# Patient Record
Sex: Female | Born: 1985 | Race: White | Hispanic: No | Marital: Single | Smoking: Current every day smoker
Health system: Southern US, Community
[De-identification: ages and names within clinical notes are randomized; demographics above are authoritative.]

## PROBLEM LIST (undated history)

## (undated) DIAGNOSIS — R319 Hematuria, unspecified: Secondary | ICD-10-CM

## (undated) DIAGNOSIS — N840 Polyp of corpus uteri: Secondary | ICD-10-CM

## (undated) DIAGNOSIS — N201 Calculus of ureter: Secondary | ICD-10-CM

## (undated) DIAGNOSIS — R87629 Unspecified abnormal cytological findings in specimens from vagina: Secondary | ICD-10-CM

## (undated) DIAGNOSIS — F419 Anxiety disorder, unspecified: Secondary | ICD-10-CM

## (undated) DIAGNOSIS — K219 Gastro-esophageal reflux disease without esophagitis: Secondary | ICD-10-CM

## (undated) DIAGNOSIS — M549 Dorsalgia, unspecified: Secondary | ICD-10-CM

## (undated) DIAGNOSIS — Z8742 Personal history of other diseases of the female genital tract: Secondary | ICD-10-CM

## (undated) DIAGNOSIS — K5900 Constipation, unspecified: Secondary | ICD-10-CM

## (undated) DIAGNOSIS — F329 Major depressive disorder, single episode, unspecified: Secondary | ICD-10-CM

## (undated) DIAGNOSIS — H409 Unspecified glaucoma: Secondary | ICD-10-CM

## (undated) DIAGNOSIS — Z8719 Personal history of other diseases of the digestive system: Secondary | ICD-10-CM

## (undated) DIAGNOSIS — F32A Depression, unspecified: Secondary | ICD-10-CM

## (undated) DIAGNOSIS — N200 Calculus of kidney: Secondary | ICD-10-CM

## (undated) DIAGNOSIS — R3915 Urgency of urination: Secondary | ICD-10-CM

## (undated) DIAGNOSIS — D259 Leiomyoma of uterus, unspecified: Secondary | ICD-10-CM

## (undated) DIAGNOSIS — Z87442 Personal history of urinary calculi: Secondary | ICD-10-CM

## (undated) DIAGNOSIS — A599 Trichomoniasis, unspecified: Secondary | ICD-10-CM

## (undated) DIAGNOSIS — K5190 Ulcerative colitis, unspecified, without complications: Secondary | ICD-10-CM

## (undated) DIAGNOSIS — K5792 Diverticulitis of intestine, part unspecified, without perforation or abscess without bleeding: Secondary | ICD-10-CM

## (undated) DIAGNOSIS — N1330 Unspecified hydronephrosis: Secondary | ICD-10-CM

## (undated) HISTORY — DX: Unspecified abnormal cytological findings in specimens from vagina: R87.629

## (undated) HISTORY — DX: Trichomoniasis, unspecified: A59.9

---
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
## 2004-03-15 ENCOUNTER — Emergency Department (HOSPITAL_COMMUNITY): Admission: EM | Admit: 2004-03-15 | Discharge: 2004-03-15 | Payer: Self-pay | Admitting: Family Medicine

## 2005-08-25 ENCOUNTER — Observation Stay (HOSPITAL_COMMUNITY): Admission: AD | Admit: 2005-08-25 | Discharge: 2005-08-26 | Payer: Self-pay | Admitting: Obstetrics and Gynecology

## 2005-10-05 ENCOUNTER — Ambulatory Visit (HOSPITAL_COMMUNITY): Admission: RE | Admit: 2005-10-05 | Discharge: 2005-10-05 | Payer: Self-pay | Admitting: Obstetrics and Gynecology

## 2005-12-08 ENCOUNTER — Ambulatory Visit: Payer: Self-pay | Admitting: Obstetrics & Gynecology

## 2005-12-08 ENCOUNTER — Inpatient Hospital Stay (HOSPITAL_COMMUNITY): Admission: AD | Admit: 2005-12-08 | Discharge: 2005-12-08 | Payer: Self-pay | Admitting: Obstetrics and Gynecology

## 2005-12-18 ENCOUNTER — Inpatient Hospital Stay (HOSPITAL_COMMUNITY): Admission: AD | Admit: 2005-12-18 | Discharge: 2005-12-20 | Payer: Self-pay | Admitting: Obstetrics and Gynecology

## 2006-02-13 ENCOUNTER — Other Ambulatory Visit: Admission: RE | Admit: 2006-02-13 | Discharge: 2006-02-13 | Payer: Self-pay | Admitting: Obstetrics and Gynecology

## 2007-07-17 ENCOUNTER — Inpatient Hospital Stay (HOSPITAL_COMMUNITY): Admission: AD | Admit: 2007-07-17 | Discharge: 2007-07-17 | Payer: Self-pay | Admitting: Obstetrics and Gynecology

## 2007-11-16 ENCOUNTER — Emergency Department (HOSPITAL_COMMUNITY): Admission: EM | Admit: 2007-11-16 | Discharge: 2007-11-16 | Payer: Self-pay | Admitting: Emergency Medicine

## 2007-11-20 ENCOUNTER — Emergency Department (HOSPITAL_COMMUNITY): Admission: EM | Admit: 2007-11-20 | Discharge: 2007-11-20 | Payer: Self-pay | Admitting: Emergency Medicine

## 2007-11-21 HISTORY — PX: INDUCED ABORTION: SHX677

## 2007-11-25 ENCOUNTER — Encounter: Payer: Self-pay | Admitting: Emergency Medicine

## 2007-11-25 ENCOUNTER — Emergency Department (HOSPITAL_COMMUNITY): Admission: EM | Admit: 2007-11-25 | Discharge: 2007-11-25 | Payer: Self-pay | Admitting: Emergency Medicine

## 2007-11-28 ENCOUNTER — Ambulatory Visit (HOSPITAL_COMMUNITY): Admission: RE | Admit: 2007-11-28 | Discharge: 2007-11-28 | Payer: Self-pay | Admitting: Urology

## 2007-11-28 HISTORY — PX: CYSTOSCOPY WITH RETROGRADE PYELOGRAM, URETEROSCOPY AND STENT PLACEMENT: SHX5789

## 2007-12-04 ENCOUNTER — Emergency Department (HOSPITAL_COMMUNITY): Admission: EM | Admit: 2007-12-04 | Discharge: 2007-12-04 | Payer: Self-pay | Admitting: Emergency Medicine

## 2008-10-26 ENCOUNTER — Emergency Department (HOSPITAL_COMMUNITY): Admission: EM | Admit: 2008-10-26 | Discharge: 2008-10-26 | Payer: Self-pay | Admitting: Family Medicine

## 2008-11-09 ENCOUNTER — Emergency Department (HOSPITAL_COMMUNITY): Admission: EM | Admit: 2008-11-09 | Discharge: 2008-11-09 | Payer: Self-pay | Admitting: Family Medicine

## 2009-03-08 ENCOUNTER — Emergency Department (HOSPITAL_COMMUNITY): Admission: EM | Admit: 2009-03-08 | Discharge: 2009-03-08 | Payer: Self-pay | Admitting: Emergency Medicine

## 2009-10-08 ENCOUNTER — Emergency Department (HOSPITAL_COMMUNITY): Admission: EM | Admit: 2009-10-08 | Discharge: 2009-10-08 | Payer: Self-pay | Admitting: Emergency Medicine

## 2009-11-11 ENCOUNTER — Emergency Department (HOSPITAL_COMMUNITY): Admission: EM | Admit: 2009-11-11 | Discharge: 2009-11-11 | Payer: Self-pay | Admitting: Emergency Medicine

## 2010-01-17 ENCOUNTER — Emergency Department (HOSPITAL_COMMUNITY): Admission: EM | Admit: 2010-01-17 | Discharge: 2010-01-17 | Payer: Self-pay | Admitting: Emergency Medicine

## 2010-08-09 ENCOUNTER — Emergency Department (HOSPITAL_COMMUNITY): Admission: EM | Admit: 2010-08-09 | Discharge: 2010-08-09 | Payer: Self-pay | Admitting: Family Medicine

## 2010-12-05 ENCOUNTER — Inpatient Hospital Stay (HOSPITAL_COMMUNITY)
Admission: AD | Admit: 2010-12-05 | Discharge: 2010-12-06 | Payer: Self-pay | Source: Home / Self Care | Attending: Obstetrics and Gynecology | Admitting: Obstetrics and Gynecology

## 2010-12-07 LAB — URINALYSIS, ROUTINE W REFLEX MICROSCOPIC
Bilirubin Urine: NEGATIVE
Ketones, ur: NEGATIVE mg/dL
Leukocytes, UA: NEGATIVE
Nitrite: NEGATIVE
Protein, ur: NEGATIVE mg/dL
Specific Gravity, Urine: >1.03 — ABNORMAL HIGH (ref 1.005–1.030)
Urine Glucose, Fasting: NEGATIVE mg/dL
Urobilinogen, UA: 0.2 mg/dL (ref 0.0–1.0)
pH: 6 (ref 5.0–8.0)

## 2010-12-07 LAB — URINE MICROSCOPIC-ADD ON

## 2010-12-07 LAB — GC/CHLAMYDIA PROBE AMP, GENITAL
Chlamydia, DNA Probe: NEGATIVE
GC Probe Amp, Genital: NEGATIVE

## 2010-12-07 LAB — WET PREP, GENITAL
Trich, Wet Prep: NONE SEEN
Yeast Wet Prep HPF POC: NONE SEEN

## 2010-12-07 LAB — POCT PREGNANCY, URINE: Preg Test, Ur: NEGATIVE

## 2011-02-20 LAB — POCT I-STAT, CHEM 8
BUN: 9 mg/dL (ref 6–23)
Calcium, Ion: 1.06 mmol/L — ABNORMAL LOW (ref 1.12–1.32)
Chloride: 107 mEq/L (ref 96–112)
Creatinine, Ser: 0.6 mg/dL (ref 0.4–1.2)
Glucose, Bld: 97 mg/dL (ref 70–99)
HCT: 45 % (ref 36.0–46.0)
Hemoglobin: 15.3 g/dL — ABNORMAL HIGH (ref 12.0–15.0)
Potassium: 4 mEq/L (ref 3.5–5.1)
Sodium: 137 mEq/L (ref 135–145)
TCO2: 22 mmol/L (ref 0–100)

## 2011-02-20 LAB — URINALYSIS, ROUTINE W REFLEX MICROSCOPIC
Bilirubin Urine: NEGATIVE
Glucose, UA: NEGATIVE mg/dL
Hgb urine dipstick: NEGATIVE
Ketones, ur: NEGATIVE mg/dL
Nitrite: NEGATIVE
Protein, ur: NEGATIVE mg/dL
Specific Gravity, Urine: 1.02 (ref 1.005–1.030)
Urobilinogen, UA: 0.2 mg/dL (ref 0.0–1.0)
pH: 7 (ref 5.0–8.0)

## 2011-02-20 LAB — POCT PREGNANCY, URINE: Preg Test, Ur: NEGATIVE

## 2011-02-22 LAB — URINE MICROSCOPIC-ADD ON

## 2011-02-22 LAB — URINALYSIS, ROUTINE W REFLEX MICROSCOPIC
Specific Gravity, Urine: 1.037 — ABNORMAL HIGH (ref 1.005–1.030)
pH: 6 (ref 5.0–8.0)

## 2011-02-22 LAB — POCT PREGNANCY, URINE: Preg Test, Ur: NEGATIVE

## 2011-03-01 LAB — WOUND CULTURE: Gram Stain: NONE SEEN

## 2011-03-23 ENCOUNTER — Emergency Department (HOSPITAL_COMMUNITY)
Admission: EM | Admit: 2011-03-23 | Discharge: 2011-03-23 | Disposition: A | Discharge status: Home / Self Care | Payer: Medicaid Other | Attending: Emergency Medicine | Admitting: Emergency Medicine

## 2011-03-23 ENCOUNTER — Emergency Department (HOSPITAL_COMMUNITY): Payer: Medicaid Other

## 2011-03-23 DIAGNOSIS — Z79899 Other long term (current) drug therapy: Secondary | ICD-10-CM | POA: Insufficient documentation

## 2011-03-23 DIAGNOSIS — R059 Cough, unspecified: Secondary | ICD-10-CM | POA: Insufficient documentation

## 2011-03-23 DIAGNOSIS — X001XXA Exposure to smoke in uncontrolled fire in building or structure, initial encounter: Secondary | ICD-10-CM | POA: Insufficient documentation

## 2011-03-23 DIAGNOSIS — J705 Respiratory conditions due to smoke inhalation: Secondary | ICD-10-CM | POA: Insufficient documentation

## 2011-03-23 DIAGNOSIS — F319 Bipolar disorder, unspecified: Secondary | ICD-10-CM | POA: Insufficient documentation

## 2011-03-23 DIAGNOSIS — Y92009 Unspecified place in unspecified non-institutional (private) residence as the place of occurrence of the external cause: Secondary | ICD-10-CM | POA: Insufficient documentation

## 2011-03-23 DIAGNOSIS — X000XXA Exposure to flames in uncontrolled fire in building or structure, initial encounter: Secondary | ICD-10-CM | POA: Insufficient documentation

## 2011-03-23 DIAGNOSIS — R05 Cough: Secondary | ICD-10-CM | POA: Insufficient documentation

## 2011-03-23 DIAGNOSIS — T2009XA Burn of unspecified degree of multiple sites of head, face, and neck, initial encounter: Secondary | ICD-10-CM | POA: Insufficient documentation

## 2011-03-23 DIAGNOSIS — M79609 Pain in unspecified limb: Secondary | ICD-10-CM | POA: Insufficient documentation

## 2011-03-23 DIAGNOSIS — H409 Unspecified glaucoma: Secondary | ICD-10-CM | POA: Insufficient documentation

## 2011-04-04 NOTE — Op Note (Signed)
Jeanne Mcneil, Jeanne Mcneil               ACCOUNT NO.:  [id number]   MEDICAL RECORD NO.:  [id number]          PATIENT TYPE:  AMB   LOCATION:  DAY                          FACILITY:  [facility]   PHYSICIAN:  Hanley Ben, M.D.  DATE OF BIRTH:  11-Oct-1986   DATE OF PROCEDURE:  11/28/2007  DATE OF DISCHARGE:                               OPERATIVE REPORT   PREOPERATIVE DIAGNOSIS:  Left ureteral calculus.   POSTOPERATIVE DIAGNOSIS:  Left ureteral calculus.   PROCEDURE:  Cystoscopy, left retrograde pyelogram, ureteroscopy and  insertion of double-J catheter.   SURGEON:  Dr. Janice Norrie.   ANESTHESIA:  General.   INDICATIONS:  The patient is 25 years old female who has been  complaining of left flank pain for about a week.  She was first seen in  the emergency room on 11/19/2007 with severe back pain associated with  nausea and vomiting.  KUB then showed a 3-mm stone in the left kidney  and a 2-mm stone at the left proximal ureter.  She was sent home on  analgesics.  She returned to the emergency room on November 22, 2006 and  again on November 24, 2006.  CT scan showed moderate to severe left  hydronephrosis with a 3-mm stone in the left kidney and a 3-mm stone in  the left proximal ureter.  Because she has remained symptomatic, she is  scheduled today for cystoscopy, left retrograde pyelogram, ureteroscopy  with stone manipulation and insertion of double-J catheter.  The  procedure, the risks and benefits were discussed in detail with the  patient.  Also told her that if I could not remove the stone I would  leave a stent for passive dilation of the ureter.  She understands and  is agreeable.   Under general anesthesia the patient was prepped and draped and placed  in the dorsal lithotomy position.  A panendoscope was inserted in the  bladder.  The bladder mucosa is normal.  There is no stone or tumor in  the bladder.  The ureteral orifices are in normal position and shape.   Retrograde  pyelogram.   A Glidewire was passed through #6-French open-ended catheter and the  open-ended catheter with the Glide wire were was passed through the  cystoscope into the left ureteral orifice.  The Glidewire was then  passed in the ureter and the open-ended catheter was advanced in the  distal ureter over the Glidewire.  The Glidewire was then removed.  Contrast was then injected through the open-ended catheter.  The distal  mid and proximal ureter appear normal.  There is a filling defect in the  proximal ureter that appears to have migrated in the lower pole of the  kidney with instillation of contrast.  The calices are moderately  dilated.  A sensor tip guidewire was then passed through the open-ended  catheter and the open-ended catheter was removed.   The cystoscope was removed.  A number 6.5 French rigid ureteroscope was  then passed in the bladder and into the left ureteral orifice but the  ureteroscope could not be passed through the intramural ureter.  The  ureteroscope was then removed.  The intramural ureter was then dilated  with the ureteroscope access sheath and the ureteroscope access sheath  was removed.  The ureteroscope was then passed in the bladder into the  ureter and advanced without difficulty all the way up into the upper  ureter to the level of the location of the ureteral calculus.  The  ureteral calculus could not be seen.  And at this point it was difficult  to advance the ureteroscope up into the renal pelvis.  It was then  decided at this point  to remove the ureteroscope and place a double-J  catheter.  The ureteroscope was then removed.  The guidewire was then  back loaded into the cystoscope and a #6-French - 26 double-J catheter  was passed over the guidewire.  The proximal curl of the double-J  catheter is in the renal pelvis.  The distal curl is in the bladder.   The patient tolerated the procedure well and left the OR in satisfactory  condition to  post anesthesia care unit.      Hanley Ben, M.D.  Electronically Signed     MN/MEDQ  D:  11/28/2007  T:  11/28/2007  Job:  [id number]
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
## 2011-04-07 NOTE — H&P (Signed)
NAMEJOE, TANNEY               ACCOUNT NO.:  [id number]   MEDICAL RECORD NO.:  [id number]           PATIENT TYPE:   LOCATION:                                 FACILITY:   PHYSICIAN:  Dede Query. Rivard, M.D. DATE OF BIRTH:  08/08/1986   DATE OF ADMISSION:  DATE OF DISCHARGE:                                HISTORY & PHYSICAL   Ms. Jeanne Mcneil is a 25 year old gravida 4, para 1-0-2-1, who presents at 40-2/[redacted]  weeks gestation with complaints of regular uterine contractions for the past  3-4 hours, as well as the last three to four hours with increasing  intensity.  The patient denies rupture of membranes or bleeding.  The  patient reports that her fetus is moving normally.  The patient's pregnancy  is remarkable for:  1.  History of positive GBS.  2.  Desires bilateral tubal ligation.  3.  Uncertain LMP.  4.  Late entry to care.  5.  History of renal lithiasis.  6.  Tobacco use.  7.  History of postpartum depression.  8.  History of condyloma.  46.  The father of the baby noninvolved in pregnancy.   PRENATAL LABORATORY:  Blood type A positive, antibody screen negative,  hemoglobin initial 10.7, platelets 273,000, rubella immune, hepatitis B  surface antigen negative, syphilis nonreactive, HIV nonreactive, gonorrhea  and chlamydia cultures negative second and third trimester, group B strep  positive on November 27, 2005, one-hour Glucola 103, cystic fibrosis screen  negative, quad screen negative.   HISTORY OF PRESENT PREGNANCY:  The patient entered care at approximately [redacted]  weeks gestation.  Ultrasound was obtained, which established an [facility] of  December 16, 2005 as the patient had uncertain dates.  The patient's  pregnancy has been followed by the Certified Nurse Midwife Service of  [facility].  The patient underwent ultrasound at initial visit  revealing normal anatomy and a female fetus.  The patient admitted at 23 weeks  and 6 days to [facility] with possible renal  lithiasis with sudden  onset of flank and back pain.  The pain resolved and no obvious lithiasis  noted at that time with imaging.  However, the patient was treated  conservatively with pain medicines and IV fluids.  The patient also with  complaints of right upper quadrant pain at 29 weeks, for which the patient  was started on Protonix.  The patient also underwent gallbladder ultrasound,  which was negative as well.  The patient signed tubal papers at [redacted] weeks  gestation.  The patient had GBS, gonorrhea and chlamydia cultures obtained  at 37 weeks with positive group B strep noted.  The patient's prenatal care  has been otherwise unremarkable.   MEDICATIONS:  Prenatal vitamins and Protonix.   ALLERGIES:  NO KNOWN DRUG ALLERGIES.   PAST MEDICAL HISTORY:  History of renal lithiasis, pyelonephritis in the  past and a history of postpartum depression.   SURGICAL HISTORY:  Wisdom teeth in 2004.   SOCIAL HISTORY:  The patient is single.  The father of the baby is not  involved.  The patient  admits to one-half pack per day of tobacco use.  The  patient denies alcohol or street drug use.  The patient's domestic violence  screen is negative.  The patient's mother and sister are supportive.   FAMILY HISTORY:  Father - chronic hypertension.  Mother - bipolar disease  and COPD.  Paternal grandfather - renal disease and COPD.  Sister - insulin-  dependent diabetes and thyroid disease.   GENETIC HISTORY:  Negative.   PHYSICAL EXAMINATION:  VITAL SIGNS:  The patient is afebrile.  Vital signs  are stable.  Blood pressure 120/72.  GENERAL:  The patient is in no apparent distress.  SKIN:  Warm and dry.  Color is satisfactory.  NEUROLOGIC:  The patient is alert and oriented.  HEART:  Regular rate and rhythm.  LUNGS:  Clear.  ABDOMEN:  Soft, gravid and nontender.  Fundal height extending 40 cm above  the symphysis pubis consistent with [redacted] weeks gestation.  Fetus is in  longitudinal lie and  vertex to Leopold's maneuvers.  Fetal heart rate 120s  to 130s and reactive with accelerations present and short-term variability  present.  No decelerations are noted.  Uterine contractions are noted every  four to five minutes lasting 60 to 70 seconds, moderate intensity.  CERVIX EXAM:  Finds cervix to be 5 to 6 cm dilated, 80% effaced and -1  station.  EXTREMITIES:  1+ edema noted in the ankles and negative Homans sign  bilaterally.  Deep tendon reflexes are 2+ and no clonus.   ASSESSMENT:  1.  Intrauterine pregnancy at 40-2/[redacted] weeks gestation.  2.  Active labor.   PLAN:  1.  The patient to be admitted to birthing suites per consult with Dr.      Cletis Media.  2.  Ampicillin for positive group B strep due to advanced dilation.  3.  The patient plans epidural for anesthesia during labor.  4.  The patient continues to desire bilateral tubal ligation postpartum.      Quentin [facility], CNM      Dede Query Rivard, M.D.  Electronically Signed    NOS/MEDQ  D:  12/18/2005  T:  12/18/2005  Job:  [id number]

## 2011-04-07 NOTE — Discharge Summary (Signed)
[name]  Patient:    Jeanne Mcneil, Jeanne Mcneil Visit Number: [id number] MRN: [id number]          Service Type: OBS Location: [facility] Attending Physician:  Crawford Givens A Dictated by:   Donnel Saxon, C.N.M. Admit Date:  02/24/2002 Discharge Date: 02/25/2002                             Discharge Summary  ADMISSION DIAGNOSES:          1. Intrauterine pregnancy at 24-2/7 weeks.                               2. Right flank pain.                               3. Rule out kidney stone.  DISCHARGE DIAGNOSES:          1. Right hydronephrosis.                               2. Questionable stone versus urinary tract                                  infection.  HOSPITAL COURSE:              [name] is a 25 year old gravida 1, para 0 at 24-2/7 weeks by early ultrasound admitted on February 24, 2002, with right flank pain x 3 days.  She had hematuria on catheterized UA.  She had no fever.  CBC showed a white blood cell count of 15.8.  There was mild hydronephrosis noted on the right.  The left kidney was within normal limits.  No stones were seen. Obstetrical ultrasound showed a single intrauterine pregnancy at 10 to the 18th percentile for 24-2/7 weeks.  Amniotic fluid within normal limits.  The cervix was 3.6 cm.  The patient was admitted for 23-hour observation, to strain urine, urine to culture, and analgesia and IV hydration were initiated. She received one dose of Ancef through the night.  She was maintained on Dilaudid IV.  She, the next morning, had rested fairly comfortably.  Her CBC WBC count was 13.5, hemoglobin 9.0.  She still had positive right CVA tenderness, and heart rate was reassuring.  Dr. Leo Grosser saw the patient in the morning and elected to change her to p.o. pain medication with Vicodin.  She received an additional one dose of antibiotic, Ancef.  By 3 oclock in the afternoon, the patient received one dose of p.o. Vicodin with good pain  relief and management.  She was feeling well, tolerating a regular diet without difficulty, and voiding without difficulty.  She was straining all urine but had seen no precipitate matter.  The patient was deemed to have received full benefit from hospital stay and was discharged home.  DISCHARGE INSTRUCTIONS:       Increase p.o. fluids and to strain all urine.  DISCHARGE MEDICATIONS:        1. Macrobid 1 p.o. b.i.d. x 7 days.                               2.  Vicodin 1 to 2 p.o. q.4-6h. p.r.n. pain.                               3. Prenatal vitamin 1 p.o. q.d.  FOLLOWUP:                     Will occur as scheduled at the office on approximately April 24.  The patient is to call prior to that time with any problems.  She is to strain all urine. Dictated by:   Donnel Saxon, C.N.M. Attending Physician:  Betsy Coder DD:  02/25/02 TD:  02/26/02 Job: [id number]
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
## 2011-04-07 NOTE — Consult Note (Signed)
Jeanne Mcneil, Jeanne Mcneil               ACCOUNT NO.:  [id number]   MEDICAL RECORD NO.:  [id number]          PATIENT TYPE:  INP   LOCATION:  9157                          FACILITY:  [facility]   PHYSICIAN:  Hanley Ben, M.D.  DATE OF BIRTH:  1986-03-15   DATE OF CONSULTATION:  08/25/2005  DATE OF DISCHARGE:                                   CONSULTATION   REASON FOR CONSULTATION:  Left flank pain.   HISTORY:  The patient is a 25 year old female who is 23-weeks pregnant. She  was seen this morning by Dr. Leo Grosser with sudden onset of left flank pain  radiating to the left lower quadrant associated with nausea and vomiting.  She had a past history of passing a stone about 3 years ago when she was  pregnant. She has not had any problem with kidney stone since until this  morning when she started having pain. She has no gross hematuria.   PAST MEDICAL HISTORY:  1.  Positive for kidney stone.  2.  History of depression.   FAMILY HISTORY:  Her sister has hypertension and diabetes. Her mother has  COPD and has bipolar disorder.   SOCIAL HISTORY:  She is single and has one child. She is 23-weeks pregnant.  She smokes about a pack a day.   ALLERGIES:  No known drug allergies.   REVIEW OF SYSTEMS:  She is complaining of nausea, vomiting and abdominal  pain. She has no other symptoms.   PHYSICAL EXAMINATION:  GENERAL:  This is a 23-weeks pregnant, 25 year old  female. She is oriented to time, place and person.  VITAL SIGNS:  Blood pressure is 115/65, pulse 86, respirations 20,  temperature 97.7.  ABDOMEN:  Distended secondary to pregnancy. She has mild left CVA tenderness  at this time. Kidneys are not palpable and she has no tenderness in the  suprapubic area.   Urinalysis shows too numerous to count rbc's, no wbc's, and 30 mg of  protein. Hemoglobin is 10.7, hematocrit 31.8 and WBC 14.7 thousand. BUN is  6, creatinine 0.7.  Renal ultrasound shows mild pelvocaliectasis but bilateral ureteral  jets  seen. The right kidney is normal.   IMPRESSION:  Left ureteral calculus with mild left hydronephrosis.   SUGGESTION:  Continue analgesics, rehydration and antibiotics. If the  patient continues to have pain she would need a double J catheter insertion.  However, conservative management is preferable in view of her pregnancy.      Hanley Ben, M.D.  Electronically Signed     MN/MEDQ  D:  08/25/2005  T:  08/25/2005  Job:  [id number]
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
## 2011-04-07 NOTE — Discharge Summary (Signed)
[name], [name]               ACCOUNT NO.:  [id number]   MEDICAL RECORD NO.:  [id number]          PATIENT TYPE:  INP   LOCATION:  9157                          FACILITY:  [facility]   PHYSICIAN:  Jeanne Mcneil, Jeanne McneilDATE OF BIRTH:  1986-11-19   DATE OF ADMISSION:  08/25/2005  DATE OF DISCHARGE:  08/26/2005                                 DISCHARGE SUMMARY   ADMITTING DIAGNOSES:  1.  Intrauterine pregnancy at 23/6/7 weeks.  2.  Probable kidney stone.   DISCHARGE DIAGNOSES:  1.  Intrauterine pregnancy at 23/6/7 weeks.  2.  Probable kidney stone questionably passed.   HOSPITAL COURSE:  Jeanne Mcneil is a 25 year old gravida 4, para 1-0-2-1 at 24-  6/7 weeks' gestation, who presented to the office with pain in her left leg  as well as back pain that began the morning of October 6.  She also had some  nausea and vomiting but denied abdominal cramping or bleeding.  Her  pregnancy has been followed by [facility] certified nurse  midwife service and has been remarkable for (1) History of possible kidney  stone on the right side.  (2) Questionable last menstrual period.  (3) Late  to care at 18 weeks.  (4) Smoker.  (5) History of postpartum depression.  (6) History of condylomata.  (7) Father of the baby not involved.  Upon  admission, her vital signs are stable.  She had frequent vomiting, rated her  pain 10/10 on a pain scale.  Fetal heart rate was stable with no uterine  contractions.  She had left CVA tenderness.  Cervix was long and closed.   ADMISSION LABORATORIES:  Hemoglobin 10.7, hematocrit 31.8, white blood cell  count 14.7, and platelets 314,000.  Her comprehensive metabolic panel was  essentially normal.  Urinalysis was remarkable for large hemoglobin and 30  of protein.  ,/HOSPITAL COURSE/>  The patient was admitted for 23 hour observation for IV hydration as well as  IV pain medication and antibiotic.  It was also requested that Dr. Janice Norrie  consult.  Dr. Janice Norrie was  consulted regarding the patient, and renal ultrasound  was planned.  Renal scan showed right kidney within normal limits, left side  with mild left pyelectasis with bilateral ureteral jets seen.  The patient  also had a biophysical profile with a score 8/8, AFI of 7.5.  Later in the  day on October 6, the patient's pain was improved with pain medication.  She  was complaining of some heartburn and was started on some Protonix.  By  hospital day #1, she was doing much better.  Her only complaint was itching  which was relieved by Benadryl.  Fetal heart rate remained reassuring; there  were no contractions.  The patient's vital signs are stable.  She is  afebrile.  She reports passing a gravel-sized stone during the night and  denied any back pain.  Currently urine culture and sensitivity is pending.  The patient was deemed to have received the full benefit of her hospital  stay and was discharged home.   DISCHARGE INSTRUCTIONS:  Preterm labor precautions  were reviewed as well as  the patient is to call for any signs and symptoms of further kidney stone.   DISCHARGE MEDICATIONS:  Prenatal vitamin 1 p.o. daily.   Discharge follow up will occur at [facility] on September 11, 2005.      Jeanne Mcneil, C.N.M.      Jeanne Mcneil, M.D.  Electronically Signed    KS/MEDQ  D:  08/26/2005  T:  08/26/2005  Job:  [id number]

## 2011-07-24 ENCOUNTER — Inpatient Hospital Stay (INDEPENDENT_AMBULATORY_CARE_PROVIDER_SITE_OTHER)
Admission: RE | Admit: 2011-07-24 | Discharge: 2011-07-24 | Disposition: A | Discharge status: Home / Self Care | Payer: Medicaid Other | Source: Ambulatory Visit | Attending: Family Medicine | Admitting: Family Medicine

## 2011-07-24 DIAGNOSIS — H6980 Other specified disorders of Eustachian tube, unspecified ear: Secondary | ICD-10-CM

## 2011-07-30 ENCOUNTER — Inpatient Hospital Stay (INDEPENDENT_AMBULATORY_CARE_PROVIDER_SITE_OTHER)
Admission: RE | Admit: 2011-07-30 | Discharge: 2011-07-30 | Disposition: A | Discharge status: Home / Self Care | Payer: Medicaid Other | Source: Ambulatory Visit | Attending: Family Medicine | Admitting: Family Medicine

## 2011-07-30 DIAGNOSIS — S058X9A Other injuries of unspecified eye and orbit, initial encounter: Secondary | ICD-10-CM

## 2011-08-10 LAB — COMPREHENSIVE METABOLIC PANEL
ALT: 23
AST: 18
Albumin: 4
Alkaline Phosphatase: 55
BUN: 13
CO2: 25
Calcium: 9.1
Chloride: 107
Creatinine, Ser: 0.94
GFR calc Af Amer: >60
GFR calc non Af Amer: >60
Glucose, Bld: 90
Potassium: 3.8
Sodium: 139
Total Bilirubin: 0.8
Total Protein: 6.6

## 2011-08-10 LAB — URINALYSIS, ROUTINE W REFLEX MICROSCOPIC
Bilirubin Urine: NEGATIVE
Bilirubin Urine: NEGATIVE
Glucose, UA: NEGATIVE
Glucose, UA: NEGATIVE
Ketones, ur: 40 — AB
Nitrite: NEGATIVE
Protein, ur: 100 — AB
Protein, ur: NEGATIVE
Specific Gravity, Urine: 1.013
Urobilinogen, UA: 0.2
pH: 7

## 2011-08-10 LAB — URINE CULTURE: Culture: NO GROWTH

## 2011-08-10 LAB — URINE MICROSCOPIC-ADD ON

## 2011-08-10 LAB — CBC
HCT: 38.5
Hemoglobin: 13.2
MCHC: 34.2
MCV: 88.8
Platelets: 287
RBC: 4.33
RDW: 13.1
WBC: 10

## 2011-08-10 LAB — DIFFERENTIAL
Basophils Relative: 1
Eosinophils Absolute: 0
Monocytes Absolute: 0.7
Monocytes Relative: 7

## 2011-08-10 LAB — HEMOGLOBIN AND HEMATOCRIT, BLOOD
HCT: 39.6
Hemoglobin: 13.7

## 2011-08-10 LAB — PREGNANCY, URINE: Preg Test, Ur: NEGATIVE

## 2011-08-25 LAB — DIFFERENTIAL
Eosinophils Absolute: 0
Eosinophils Relative: 0
Lymphs Abs: 2.4
Monocytes Absolute: 1

## 2011-08-25 LAB — URINALYSIS, ROUTINE W REFLEX MICROSCOPIC
Bilirubin Urine: NEGATIVE
Glucose, UA: NEGATIVE
Hgb urine dipstick: NEGATIVE
Ketones, ur: 15 — AB
Nitrite: NEGATIVE
Protein, ur: 30 — AB
Specific Gravity, Urine: 1.034 — ABNORMAL HIGH
pH: 7.5

## 2011-08-25 LAB — URINE MICROSCOPIC-ADD ON

## 2011-08-25 LAB — POCT URINALYSIS DIP (DEVICE)
Glucose, UA: NEGATIVE mg/dL
Nitrite: NEGATIVE
pH: 6.5 (ref 5.0–8.0)

## 2011-08-25 LAB — CBC
HCT: 41.1
MCV: 89.9
Platelets: 286
WBC: 15 — ABNORMAL HIGH

## 2011-08-25 LAB — WET PREP, GENITAL: Yeast Wet Prep HPF POC: NONE SEEN

## 2011-08-25 LAB — POCT PREGNANCY, URINE
Operator id: 257131
Preg Test, Ur: NEGATIVE

## 2011-08-25 LAB — GC/CHLAMYDIA PROBE AMP, GENITAL: GC Probe Amp, Genital: NEGATIVE

## 2011-09-01 LAB — WET PREP, GENITAL: Clue Cells Wet Prep HPF POC: NONE SEEN

## 2011-09-01 LAB — URINALYSIS, ROUTINE W REFLEX MICROSCOPIC
Bilirubin Urine: NEGATIVE
Glucose, UA: NEGATIVE
Ketones, ur: NEGATIVE
Nitrite: NEGATIVE
Protein, ur: NEGATIVE
pH: 6

## 2011-09-01 LAB — URINE MICROSCOPIC-ADD ON

## 2011-09-01 LAB — GC/CHLAMYDIA PROBE AMP, GENITAL
Chlamydia, DNA Probe: POSITIVE — AB
GC Probe Amp, Genital: NEGATIVE

## 2011-09-01 LAB — POCT PREGNANCY, URINE: Operator id: 114931

## 2012-04-10 ENCOUNTER — Encounter: Payer: Medicaid Other | Admitting: Obstetrics & Gynecology

## 2012-06-07 ENCOUNTER — Telehealth: Payer: Self-pay | Admitting: Obstetrics and Gynecology

## 2012-09-16 ENCOUNTER — Encounter (HOSPITAL_COMMUNITY): Payer: Self-pay | Admitting: *Deleted

## 2012-09-16 ENCOUNTER — Emergency Department (HOSPITAL_COMMUNITY)
Admission: EM | Admit: 2012-09-16 | Discharge: 2012-09-16 | Disposition: A | Discharge status: Home / Self Care | Payer: Self-pay | Attending: Emergency Medicine | Admitting: Emergency Medicine

## 2012-09-16 DIAGNOSIS — R109 Unspecified abdominal pain: Secondary | ICD-10-CM

## 2012-09-16 DIAGNOSIS — M542 Cervicalgia: Secondary | ICD-10-CM | POA: Insufficient documentation

## 2012-09-16 DIAGNOSIS — F17200 Nicotine dependence, unspecified, uncomplicated: Secondary | ICD-10-CM | POA: Insufficient documentation

## 2012-09-16 DIAGNOSIS — Z791 Long term (current) use of non-steroidal anti-inflammatories (NSAID): Secondary | ICD-10-CM | POA: Insufficient documentation

## 2012-09-16 DIAGNOSIS — Z8639 Personal history of other endocrine, nutritional and metabolic disease: Secondary | ICD-10-CM | POA: Insufficient documentation

## 2012-09-16 DIAGNOSIS — R220 Localized swelling, mass and lump, head: Secondary | ICD-10-CM | POA: Insufficient documentation

## 2012-09-16 DIAGNOSIS — R112 Nausea with vomiting, unspecified: Secondary | ICD-10-CM | POA: Insufficient documentation

## 2012-09-16 DIAGNOSIS — R1013 Epigastric pain: Secondary | ICD-10-CM | POA: Insufficient documentation

## 2012-09-16 DIAGNOSIS — H409 Unspecified glaucoma: Secondary | ICD-10-CM | POA: Insufficient documentation

## 2012-09-16 DIAGNOSIS — Z862 Personal history of diseases of the blood and blood-forming organs and certain disorders involving the immune mechanism: Secondary | ICD-10-CM | POA: Insufficient documentation

## 2012-09-16 LAB — CBC WITH DIFFERENTIAL/PLATELET
Basophils Absolute: 0.1 10*3/uL (ref 0.0–0.1)
Basophils Relative: 1 % (ref 0–1)
Eosinophils Absolute: 0.1 10*3/uL (ref 0.0–0.7)
Eosinophils Relative: 1 % (ref 0–5)
HCT: 41.1 % (ref 36.0–46.0)
Hemoglobin: 13.9 g/dL (ref 12.0–15.0)
Lymphocytes Relative: 28 % (ref 12–46)
Lymphs Abs: 2.9 10*3/uL (ref 0.7–4.0)
MCH: 31.1 pg (ref 26.0–34.0)
MCHC: 33.8 g/dL (ref 30.0–36.0)
MCV: 91.9 fL (ref 78.0–100.0)
Monocytes Absolute: 1 10*3/uL (ref 0.1–1.0)
Monocytes Relative: 9 % (ref 3–12)
Neutro Abs: 6.3 10*3/uL (ref 1.7–7.7)
Neutrophils Relative %: 61 % (ref 43–77)
Platelets: 274 10*3/uL (ref 150–400)
RBC: 4.47 MIL/uL (ref 3.87–5.11)
RDW: 12.7 % (ref 11.5–15.5)
WBC: 10.4 10*3/uL (ref 4.0–10.5)

## 2012-09-16 LAB — URINALYSIS, ROUTINE W REFLEX MICROSCOPIC
Bilirubin Urine: NEGATIVE
Glucose, UA: NEGATIVE mg/dL
Ketones, ur: NEGATIVE mg/dL
Leukocytes, UA: NEGATIVE
Nitrite: NEGATIVE
Protein, ur: NEGATIVE mg/dL
Specific Gravity, Urine: 1.02 (ref 1.005–1.030)
Urobilinogen, UA: 0.2 mg/dL (ref 0.0–1.0)
pH: 7.5 (ref 5.0–8.0)

## 2012-09-16 LAB — COMPREHENSIVE METABOLIC PANEL
ALT: 19 U/L (ref 0–35)
AST: 17 U/L (ref 0–37)
Albumin: 3.8 g/dL (ref 3.5–5.2)
Alkaline Phosphatase: 55 U/L (ref 39–117)
BUN: 11 mg/dL (ref 6–23)
CO2: 27 mEq/L (ref 19–32)
Calcium: 9.1 mg/dL (ref 8.4–10.5)
Chloride: 104 mEq/L (ref 96–112)
Creatinine, Ser: 0.7 mg/dL (ref 0.50–1.10)
GFR calc Af Amer: >90 mL/min (ref >90)
GFR calc non Af Amer: >90 mL/min (ref >90)
Glucose, Bld: 97 mg/dL (ref 70–99)
Potassium: 3.8 mEq/L (ref 3.5–5.1)
Sodium: 139 mEq/L (ref 135–145)
Total Bilirubin: 0.3 mg/dL (ref 0.3–1.2)
Total Protein: 6.5 g/dL (ref 6.0–8.3)

## 2012-09-16 LAB — URINE MICROSCOPIC-ADD ON

## 2012-09-16 LAB — LIPASE, BLOOD: Lipase: 27 U/L (ref 11–59)

## 2012-09-16 MED ORDER — GI COCKTAIL ~~LOC~~
30.0000 mL | Freq: Once | ORAL | Status: AC
  Administered 2012-09-16: 30 mL via ORAL
  Filled 2012-09-16: qty 30

## 2012-09-16 MED ORDER — FAMOTIDINE 40 MG PO TABS: 40.0000 mg | ORAL_TABLET | Freq: Every day | ORAL | Status: DC

## 2012-09-16 MED ORDER — FAMOTIDINE 20 MG PO TABS
40.0000 mg | ORAL_TABLET | Freq: Once | ORAL | Status: AC
  Administered 2012-09-16: 40 mg via ORAL
  Filled 2012-09-16: qty 2

## 2012-09-16 MED ORDER — ONDANSETRON HCL 4 MG PO TABS: 4.0000 mg | ORAL_TABLET | Freq: Three times a day (TID) | ORAL | Status: DC | PRN

## 2012-09-16 NOTE — ED Provider Notes (Signed)
History  This chart was scribed for Virgel Manifold, MD by Lannette Donath and Roe Coombs. The patient was seen in room TR06C/TR06C. Patient's care was started at 1823.   CSN: [id number]  Arrival date & time 09/16/12  1458   First MD Initiated Contact with Patient 09/16/12 1823      Chief Complaint  Patient presents with  . Abdominal Pain     Patient is a 26 y.o. female presenting with abdominal pain. The history is provided by the patient. No language interpreter was used.  Abdominal Pain The primary symptoms of the illness include abdominal pain, nausea and vomiting. The primary symptoms of the illness do not include fever, vaginal discharge or vaginal bleeding. The current episode started yesterday. The onset of the illness was sudden. The problem has not changed since onset. The abdominal pain began yesterday. The pain came on suddenly. The abdominal pain has been unchanged since its onset. The abdominal pain is located in the epigastric region. The abdominal pain does not radiate.  Nausea began yesterday. The nausea is associated with eating. The nausea is exacerbated by food.  Additional symptoms associated with the illness include chills and urgency. Significant associated medical issues include GERD.    Jeanne Mcneil is a 26 y.o. female who presents to the Emergency Department complaining of constant, moderate to severe epigastric abdominal pain onset yesterday. Patient describes pain as burning, sharp, and stabbing. There is associated nausea, emesis, chills,urgency of urination, and decreased intake. Patient denies fever, vaginal discharge, or vaginal bleeding. She states that pain improves while sitting. Patient has a history of hypercalcuria and acid reflux. She takes OTC medication to manage acid reflux. She is a smoker and drinks occastionally.   Patient is also complaining of a "knot" to posterior neck onset more than 1 week ago. There are intermittent associated shooting  pains at the site.    Past Medical History  Diagnosis Date  . Hypercalcuria   . Glaucoma     Past Surgical History  Procedure Date  . Kidney stents     No family history on file.  History  Substance Use Topics  . Smoking status: Current Every Day Smoker  . Smokeless tobacco: Not on file  . Alcohol Use: Yes     occ    OB History    Grav Para Term Preterm Abortions TAB SAB Ect Mult Living                  Review of Systems  Constitutional: Positive for chills and appetite change. Negative for fever.  HENT: Positive for neck pain.   Gastrointestinal: Positive for nausea, vomiting and abdominal pain.  Genitourinary: Positive for urgency. Negative for vaginal bleeding and vaginal discharge.  All other systems reviewed and are negative.    Allergies  Other; Percocet; and Vicodin  Home Medications   Current Outpatient Rx  Name Route Sig Dispense Refill  . NAPROXEN 250 MG PO TABS Oral Take 250 mg by mouth 3 (three) times daily with meals.      BP 117/86  Pulse 74  Temp 98.1 F (36.7 C) (Oral)  Resp 18  SpO2 96%  LMP 09/07/2012  Physical Exam  Nursing note and vitals reviewed. Constitutional: She appears well-developed and well-nourished. No distress.  HENT:  Head: Normocephalic and atraumatic.  Eyes: Conjunctivae normal are normal. Right eye exhibits no discharge. Left eye exhibits no discharge.  Neck: Neck supple.       Small soft tissue mass to  base of right neck. No tenderness. Not mobile. No overlying skin changes.   Cardiovascular: Normal rate, regular rhythm and normal heart sounds.  Exam reveals no gallop and no friction rub.   No murmur heard. Pulmonary/Chest: Effort normal and breath sounds normal. No respiratory distress.  Abdominal: Soft. She exhibits no distension. There is tenderness in the epigastric area. There is no rebound and no guarding.       Mild tenderness to epigastrium. No rebound, no guarding.   Musculoskeletal: She exhibits no  edema and no tenderness.  Neurological: She is alert.  Skin: Skin is warm and dry.  Psychiatric: She has a normal mood and affect. Her behavior is normal. Thought content normal.    ED Course  Procedures (including critical care time) DIAGNOSTIC STUDIES: Oxygen Saturation is 96% on room air, adequate by my interpretation.    COORDINATION OF CARE:  7:05pm- Patient informed of clinical course, understand medical decision-making process, and agree with plan.   Labs Reviewed  URINALYSIS, ROUTINE W REFLEX MICROSCOPIC - Abnormal; Notable for the following:    APPearance CLOUDY (*)     Hgb urine dipstick SMALL (*)     All other components within normal limits  URINE MICROSCOPIC-ADD ON - Abnormal; Notable for the following:    Squamous Epithelial / LPF MANY (*)     Bacteria, UA MANY (*)     All other components within normal limits  CBC WITH DIFFERENTIAL  COMPREHENSIVE METABOLIC PANEL  LIPASE, BLOOD  POCT PREGNANCY, URINE   No results found.   1. Abdominal pain       MDM  26yF with abdominal pain. Suspect gastritis or possibly PUD. Plan course of PPI. Return precautions discussed. Pt with small nontender mass posterior neck. Doesn't appear to be infectious process or node. Plan observation at this point. Understands need for further evualution if doesn't resolve within a week and what signs/symptoms to return for emergent re-eeval.      I personally preformed the services scribed in my presence. The recorded information has been reviewed and considered. Virgel Manifold, MD.    Virgel Manifold, MD 09/19/12 [phone]

## 2012-09-16 NOTE — ED Notes (Signed)
Pt is here with right upper quad pain and vomiting with diarrhea since yesterday and pt states pain radiates up to neck

## 2013-05-17 ENCOUNTER — Emergency Department (HOSPITAL_COMMUNITY)
Admission: EM | Admit: 2013-05-17 | Discharge: 2013-05-17 | Disposition: A | Discharge status: Home / Self Care | Payer: Self-pay | Attending: Emergency Medicine | Admitting: Emergency Medicine

## 2013-05-17 ENCOUNTER — Encounter (HOSPITAL_COMMUNITY): Payer: Self-pay | Admitting: Emergency Medicine

## 2013-05-17 DIAGNOSIS — F17200 Nicotine dependence, unspecified, uncomplicated: Secondary | ICD-10-CM | POA: Insufficient documentation

## 2013-05-17 DIAGNOSIS — Z8639 Personal history of other endocrine, nutritional and metabolic disease: Secondary | ICD-10-CM | POA: Insufficient documentation

## 2013-05-17 DIAGNOSIS — R112 Nausea with vomiting, unspecified: Secondary | ICD-10-CM | POA: Insufficient documentation

## 2013-05-17 DIAGNOSIS — R1013 Epigastric pain: Secondary | ICD-10-CM | POA: Insufficient documentation

## 2013-05-17 DIAGNOSIS — K2990 Gastroduodenitis, unspecified, without bleeding: Secondary | ICD-10-CM | POA: Insufficient documentation

## 2013-05-17 DIAGNOSIS — Z79899 Other long term (current) drug therapy: Secondary | ICD-10-CM | POA: Insufficient documentation

## 2013-05-17 DIAGNOSIS — Z862 Personal history of diseases of the blood and blood-forming organs and certain disorders involving the immune mechanism: Secondary | ICD-10-CM | POA: Insufficient documentation

## 2013-05-17 DIAGNOSIS — K219 Gastro-esophageal reflux disease without esophagitis: Secondary | ICD-10-CM | POA: Insufficient documentation

## 2013-05-17 DIAGNOSIS — K2970 Gastritis, unspecified, without bleeding: Secondary | ICD-10-CM | POA: Insufficient documentation

## 2013-05-17 DIAGNOSIS — Z8669 Personal history of other diseases of the nervous system and sense organs: Secondary | ICD-10-CM | POA: Insufficient documentation

## 2013-05-17 DIAGNOSIS — Z3202 Encounter for pregnancy test, result negative: Secondary | ICD-10-CM | POA: Insufficient documentation

## 2013-05-17 LAB — COMPREHENSIVE METABOLIC PANEL
Alkaline Phosphatase: 62 U/L (ref 39–117)
BUN: 10 mg/dL (ref 6–23)
Chloride: 101 mEq/L (ref 96–112)
GFR calc Af Amer: >90 mL/min (ref >90)
Glucose, Bld: 89 mg/dL (ref 70–99)
Potassium: 4.1 mEq/L (ref 3.5–5.1)
Total Bilirubin: 0.2 mg/dL — ABNORMAL LOW (ref 0.3–1.2)

## 2013-05-17 LAB — CBC
HCT: 41 % (ref 36.0–46.0)
Hemoglobin: 14.2 g/dL (ref 12.0–15.0)
MCHC: 34.6 g/dL (ref 30.0–36.0)
MCV: 90.7 fL (ref 78.0–100.0)

## 2013-05-17 LAB — URINALYSIS, ROUTINE W REFLEX MICROSCOPIC
Bilirubin Urine: NEGATIVE
Ketones, ur: NEGATIVE mg/dL
Nitrite: NEGATIVE
Protein, ur: NEGATIVE mg/dL
Urobilinogen, UA: 0.2 mg/dL (ref 0.0–1.0)

## 2013-05-17 LAB — LIPASE, BLOOD: Lipase: 18 U/L (ref 11–59)

## 2013-05-17 MED ORDER — SODIUM CHLORIDE 0.9 % IV BOLUS (SEPSIS)
1000.0000 mL | Freq: Once | INTRAVENOUS | Status: AC
  Administered 2013-05-17: 1000 mL via INTRAVENOUS

## 2013-05-17 MED ORDER — ONDANSETRON HCL 4 MG/2ML IJ SOLN
4.0000 mg | Freq: Once | INTRAMUSCULAR | Status: AC
  Administered 2013-05-17: 4 mg via INTRAVENOUS
  Filled 2013-05-17: qty 2

## 2013-05-17 MED ORDER — PANTOPRAZOLE SODIUM 40 MG IV SOLR
40.0000 mg | Freq: Once | INTRAVENOUS | Status: AC
  Administered 2013-05-17: 40 mg via INTRAVENOUS
  Filled 2013-05-17: qty 40

## 2013-05-17 MED ORDER — MORPHINE SULFATE 4 MG/ML IJ SOLN
4.0000 mg | Freq: Once | INTRAMUSCULAR | Status: AC
  Administered 2013-05-17: 4 mg via INTRAVENOUS
  Filled 2013-05-17: qty 1

## 2013-05-17 MED ORDER — OMEPRAZOLE 20 MG PO CPDR: 20.0000 mg | DELAYED_RELEASE_CAPSULE | Freq: Every day | ORAL | Status: DC

## 2013-05-17 MED ORDER — ONDANSETRON HCL 8 MG PO TABS: 8.0000 mg | ORAL_TABLET | Freq: Three times a day (TID) | ORAL | Status: DC | PRN

## 2013-05-17 MED ORDER — GI COCKTAIL ~~LOC~~
30.0000 mL | Freq: Once | ORAL | Status: AC
  Administered 2013-05-17: 30 mL via ORAL
  Filled 2013-05-17: qty 30

## 2013-05-17 MED ORDER — LIDOCAINE VISCOUS 2 % MT SOLN: OROMUCOSAL | Status: DC

## 2013-05-17 NOTE — ED Provider Notes (Signed)
History    CSN: [id number] Arrival date & time 05/17/13  1147  First MD Initiated Contact with Patient 05/17/13 1206     Chief Complaint  Patient presents with  . Emesis   (Consider location/radiation/quality/duration/timing/severity/associated sxs/prior Treatment) HPI  Jeanne Mcneil Is a 27 year old female with a past medical history of GERD, hypercalciuria, GERD seen here previously for an episode of gastritis.  Patient presents to the emergency department complaining of severe epigastric pain, nausea, a episodes of vomitus.  Patient states that last night she had for some shots of brandy.  She became acutely ill with severe epigastric pain.  She vomited and retched repeatedly overnight.  This morning she began vomiting profusely bloody vomit mixed with gastric acid.  She states that she has difficulty swallowing and feels as if there is something caught in her throat near her Adam's apple.  She states that she has severe pain with swallowing.  Her at abdominal pain and nausea have resolved at this point.  Had no more episodes of bleeding. Denies fevers, chills, myalgias, arthralgias. Denies DOE, SOB, chest tightness or pressure, radiation to left arm, jaw or back, or diaphoresis. Denies dysuria, flank pain, suprapubic pain, frequency, urgency, or hematuria. Denies headaches, light headedness, weakness, visual disturbances.   Past Medical History  Diagnosis Date  . Hypercalcuria   . Glaucoma    Past Surgical History  Procedure Laterality Date  . Kidney stents     No family history on file. History  Substance Use Topics  . Smoking status: Current Every Day Smoker    Types: Cigarettes  . Smokeless tobacco: Not on file  . Alcohol Use: 2.4 oz/week    4 Shots of liquor per week     Comment: occ   OB History   Grav Para Term Preterm Abortions TAB SAB Ect Mult Living                 Review of Systems Ten systems reviewed and are negative for acute change, except as noted in  the HPI.   Allergies  Other; Percocet; and Vicodin  Home Medications   Current Outpatient Rx  Name  Route  Sig  Dispense  Refill  . famotidine (PEPCID) 40 MG tablet   Oral   Take 1 tablet (40 mg total) by mouth daily.   30 tablet   0   . naproxen (NAPROSYN) 250 MG tablet   Oral   Take 250 mg by mouth 3 (three) times daily with meals.         . ondansetron (ZOFRAN) 4 MG tablet   Oral   Take 1 tablet (4 mg total) by mouth every 8 (eight) hours as needed for nausea.   12 tablet   0    BP 115/73  Pulse 74  Temp(Src) 97.9 F (36.6 C) (Oral)  Resp 18  Ht 5' 2"  (1.575 m)  Wt 140 lb (63.504 kg)  BMI 25.6 kg/m2  SpO2 97% Physical Exam Physical Exam  Nursing note and vitals reviewed. Constitutional: She is oriented to person, place, and time. She appears well-developed and well-nourished. No distress.  HENT:  Head: Normocephalic and atraumatic.  Eyes: Conjunctivae normal and EOM are normal. Pupils are equal, round, and reactive to light. No scleral icterus.  Neck: Normal range of motion.  Cardiovascular: Normal rate, regular rhythm and normal heart sounds.  Exam reveals no gallop and no friction rub.   No murmur heard. Pulmonary/Chest: Effort normal and breath sounds normal. No respiratory  distress.  Abdominal: Soft. Bowel sounds are normal. She exhibits no distension and no mass. There is no tenderness. There is no guarding.  Neurological: She is alert and oriented to person, place, and time.  Skin: Skin is warm and dry. She is not diaphoretic.    ED Course  Procedures (including critical care time) Labs Reviewed  CBC - Abnormal; Notable for the following:    WBC 11.8 (*)    All other components within normal limits  COMPREHENSIVE METABOLIC PANEL - Abnormal; Notable for the following:    Total Bilirubin 0.2 (*)    All other components within normal limits  LIPASE, BLOOD  URINALYSIS, ROUTINE W REFLEX MICROSCOPIC  POCT PREGNANCY, URINE   No results found. 1.  Gastritis   2. Nausea and vomiting     MDM  1:29 PM BP 115/73  Pulse 74  Temp(Src) 97.9 F (36.6 C) (Oral)  Resp 18  Ht 5' 2"  (1.575 m)  Wt 140 lb (63.504 kg)  BMI 25.6 kg/m2  SpO2 97% Patient with a history of gastritis.  I suspect she acute gastritis DT or alcohol intake last night.  Suspect suspect Mallory-Weiss tear and esophageal swallowing secondary to vomiting.  Have ordered IV Protonix and rehydration.  Patient will also be given a GI cocktail with the goal of by mouth fluid challenge.  2:31 PM Patient states that  herpain is much better. She is able to swallow fluids without pain but states that she still has swelling and it feels "irritating" . No furthrer vomiting.  Margarita Mail, PA-C 05/17/13 1503

## 2013-05-17 NOTE — ED Provider Notes (Signed)
Medical screening examination/treatment/procedure(s) were performed by non-physician practitioner and as supervising physician I was immediately available for consultation/collaboration.  Orlie Dakin, MD 05/17/13 [phone]

## 2013-05-17 NOTE — ED Notes (Signed)
Started vomiting about 0300 this morning; continuously since then, states she is now vomiting red blood and she feels like she may have ripped something in her throat. Epigastric pain.

## 2013-11-02 ENCOUNTER — Inpatient Hospital Stay (HOSPITAL_COMMUNITY)
Admission: AD | Admit: 2013-11-02 | Discharge: 2013-11-02 | Disposition: A | Discharge status: Home / Self Care | Payer: Medicaid Other | Source: Ambulatory Visit | Attending: Obstetrics and Gynecology | Admitting: Obstetrics and Gynecology

## 2013-11-02 ENCOUNTER — Encounter (HOSPITAL_COMMUNITY): Payer: Self-pay

## 2013-11-02 DIAGNOSIS — B9689 Other specified bacterial agents as the cause of diseases classified elsewhere: Secondary | ICD-10-CM | POA: Insufficient documentation

## 2013-11-02 DIAGNOSIS — B009 Herpesviral infection, unspecified: Secondary | ICD-10-CM

## 2013-11-02 DIAGNOSIS — N760 Acute vaginitis: Secondary | ICD-10-CM | POA: Insufficient documentation

## 2013-11-02 DIAGNOSIS — R109 Unspecified abdominal pain: Secondary | ICD-10-CM | POA: Insufficient documentation

## 2013-11-02 DIAGNOSIS — A499 Bacterial infection, unspecified: Secondary | ICD-10-CM | POA: Insufficient documentation

## 2013-11-02 DIAGNOSIS — M542 Cervicalgia: Secondary | ICD-10-CM | POA: Insufficient documentation

## 2013-11-02 LAB — WET PREP, GENITAL

## 2013-11-02 LAB — URINALYSIS, ROUTINE W REFLEX MICROSCOPIC
Bilirubin Urine: NEGATIVE
Glucose, UA: NEGATIVE mg/dL
Ketones, ur: NEGATIVE mg/dL
pH: 6 (ref 5.0–8.0)

## 2013-11-02 LAB — URINE MICROSCOPIC-ADD ON

## 2013-11-02 MED ORDER — METRONIDAZOLE 500 MG PO TABS: 500.0000 mg | ORAL_TABLET | Freq: Two times a day (BID) | ORAL | Status: DC

## 2013-11-02 MED ORDER — VALACYCLOVIR HCL 1 G PO TABS: 1000.0000 mg | ORAL_TABLET | Freq: Two times a day (BID) | ORAL | Status: AC

## 2013-11-02 NOTE — MAU Note (Signed)
Patient states she has been having lower abdominal pain for about one week, feels like may be ovary pain. Has a knot on the back of her neck for about one year, now causes numbness in the right arm and down her leg and pain with neck movement. Has a vaginal rash that started yesterday with a discharge and itch.

## 2013-11-02 NOTE — MAU Provider Note (Signed)
History     CSN: [id number]  Arrival date and time: 11/02/13 1520   First Provider Initiated Contact with Patient 11/02/13 1705      Chief Complaint  Patient presents with  . Abdominal Pain  . Neck Pain  . Vaginal Discharge   HPI Comments: Jeanne Mcneil 27 y.o. [id number] comes to MAU with vaginal rash, discharge and low abdominal discomfort. She also has a knot on her neck for over one year. She has the same sexual partner for 14 years except for one " affair " seven months ago. She describes the rash as painful and it started yesterday.   Abdominal Pain  Neck Pain   Vaginal Discharge The patient's primary symptoms include a vaginal discharge. Associated symptoms include abdominal pain and rash.      Past Medical History  Diagnosis Date  . Hypercalcuria   . Glaucoma     Past Surgical History  Procedure Laterality Date  . Kidney stents      History reviewed. No pertinent family history.  History  Substance Use Topics  . Smoking status: Current Every Day Smoker    Types: Cigarettes  . Smokeless tobacco: Not on file  . Alcohol Use: 2.4 oz/week    4 Shots of liquor per week     Comment: occ    Allergies:  Allergies  Allergen Reactions  . Codeine Hives and Itching  . Other     grass  . Percocet [Oxycodone-Acetaminophen] Itching  . Vicodin [Hydrocodone-Acetaminophen] Itching    Prescriptions prior to admission  Medication Sig Dispense Refill  . ibuprofen (ADVIL,MOTRIN) 200 MG tablet Take 200 mg by mouth every 6 (six) hours as needed for mild pain.        Review of Systems  Constitutional: Negative.   HENT: Negative.   Eyes: Negative.   Respiratory: Negative.   Cardiovascular: Negative.   Gastrointestinal: Positive for abdominal pain.  Genitourinary: Positive for vaginal discharge.  Musculoskeletal: Positive for neck pain.       Knot on neck  Skin: Positive for rash.       In vaginal area   Physical Exam   Blood pressure 130/80, pulse 90,  temperature 99.1 F (37.3 C), temperature source Oral, resp. rate 16, height 5' 2.5" (1.588 m), weight 67.223 kg (148 lb 3.2 oz), last menstrual period 10/07/2013, SpO2 100.00%.  Physical Exam  Constitutional: She appears well-developed and well-nourished. No distress.  Room smells like smoke and alcohol  HENT:  Head: Normocephalic and atraumatic.  Eyes: Pupils are equal, round, and reactive to light.  Cardiovascular: Normal rate, regular rhythm and normal heart sounds.   Respiratory: Effort normal and breath sounds normal.  GI: Soft. Bowel sounds are normal. She exhibits distension. There is tenderness.  Inguinal lymphadenopathy   Genitourinary:  Genitalia: External on left labia majoris there are 5 separate fluid filled , tender lesions that are .5 to 1 cm each Vaginal: thin white discharge Cervix: Negative for lesions Biman: did not do / possibility of spreading virus    Musculoskeletal:  On spine there is a 5 cm soft, nontender mess  Skin: Skin is warm and dry.  Psychiatric: She has a normal mood and affect. Her behavior is normal. Judgment and thought content normal.  tearful   Results for orders placed during the hospital encounter of 11/02/13 (from the past 24 hour(s))  URINALYSIS, ROUTINE W REFLEX MICROSCOPIC     Status: Abnormal   Collection Time    11/02/13  4:00 PM  Result Value Range   Color, Urine YELLOW  YELLOW   APPearance CLEAR  CLEAR   Specific Gravity, Urine >1.030 (*) 1.005 - 1.030   pH 6.0  5.0 - 8.0   Glucose, UA NEGATIVE  NEGATIVE mg/dL   Hgb urine dipstick TRACE (*) NEGATIVE   Bilirubin Urine NEGATIVE  NEGATIVE   Ketones, ur NEGATIVE  NEGATIVE mg/dL   Protein, ur NEGATIVE  NEGATIVE mg/dL   Urobilinogen, UA 0.2  0.0 - 1.0 mg/dL   Nitrite NEGATIVE  NEGATIVE   Leukocytes, UA TRACE (*) NEGATIVE  URINE MICROSCOPIC-ADD ON     Status: Abnormal   Collection Time    11/02/13  4:00 PM      Result Value Range   Squamous Epithelial / LPF MANY (*) RARE    WBC, UA 3-6  <3 WBC/hpf   RBC / HPF 0-2  <3 RBC/hpf   Bacteria, UA FEW (*) RARE  POCT PREGNANCY, URINE     Status: None   Collection Time    11/02/13  4:12 PM      Result Value Range   Preg Test, Ur NEGATIVE  NEGATIVE  WET PREP, GENITAL     Status: Abnormal   Collection Time    11/02/13  5:15 PM      Result Value Range   Yeast Wet Prep HPF POC NONE SEEN  NONE SEEN   Trich, Wet Prep NONE SEEN  NONE SEEN   Clue Cells Wet Prep HPF POC MANY (*) NONE SEEN   WBC, Wet Prep HPF POC FEW (*) NONE SEEN    MAU Course  Procedures  MDM  Wet prep, GC/ Chlamydia, HSV culture  Assessment and Plan   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

## 2013-11-02 NOTE — MAU Provider Note (Signed)
Attestation of Attending Supervision of Advanced Practitioner: Evaluation and management procedures were performed by the PA/NP/CNM/OB Fellow under my supervision/collaboration. Chart reviewed and agree with management and plan.  Rane Blitch V 11/02/2013 9:07 PM

## 2013-11-03 LAB — URINE CULTURE: Colony Count: NO GROWTH

## 2013-11-03 LAB — GC/CHLAMYDIA PROBE AMP: GC Probe RNA: NEGATIVE

## 2013-11-05 LAB — HERPES SIMPLEX VIRUS CULTURE: Culture: DETECTED

## 2014-06-08 ENCOUNTER — Emergency Department (HOSPITAL_COMMUNITY): Payer: Self-pay

## 2014-06-08 ENCOUNTER — Emergency Department (HOSPITAL_COMMUNITY)
Admission: EM | Admit: 2014-06-08 | Discharge: 2014-06-08 | Disposition: A | Discharge status: Home / Self Care | Payer: Self-pay | Attending: Emergency Medicine | Admitting: Emergency Medicine

## 2014-06-08 ENCOUNTER — Encounter (HOSPITAL_COMMUNITY): Payer: Self-pay | Admitting: Emergency Medicine

## 2014-06-08 DIAGNOSIS — R102 Pelvic and perineal pain: Secondary | ICD-10-CM

## 2014-06-08 DIAGNOSIS — N9489 Other specified conditions associated with female genital organs and menstrual cycle: Secondary | ICD-10-CM | POA: Insufficient documentation

## 2014-06-08 DIAGNOSIS — F17200 Nicotine dependence, unspecified, uncomplicated: Secondary | ICD-10-CM | POA: Insufficient documentation

## 2014-06-08 DIAGNOSIS — Z862 Personal history of diseases of the blood and blood-forming organs and certain disorders involving the immune mechanism: Secondary | ICD-10-CM | POA: Insufficient documentation

## 2014-06-08 DIAGNOSIS — D259 Leiomyoma of uterus, unspecified: Secondary | ICD-10-CM | POA: Insufficient documentation

## 2014-06-08 DIAGNOSIS — H409 Unspecified glaucoma: Secondary | ICD-10-CM | POA: Insufficient documentation

## 2014-06-08 DIAGNOSIS — M549 Dorsalgia, unspecified: Secondary | ICD-10-CM | POA: Insufficient documentation

## 2014-06-08 DIAGNOSIS — Z8639 Personal history of other endocrine, nutritional and metabolic disease: Secondary | ICD-10-CM | POA: Insufficient documentation

## 2014-06-08 DIAGNOSIS — Z3202 Encounter for pregnancy test, result negative: Secondary | ICD-10-CM | POA: Insufficient documentation

## 2014-06-08 LAB — CBC
HEMATOCRIT: 42 % (ref 36.0–46.0)
Hemoglobin: 13.9 g/dL (ref 12.0–15.0)
MCH: 30.7 pg (ref 26.0–34.0)
MCHC: 33.1 g/dL (ref 30.0–36.0)
MCV: 92.7 fL (ref 78.0–100.0)
PLATELETS: 283 10*3/uL (ref 150–400)
RBC: 4.53 MIL/uL (ref 3.87–5.11)
RDW: 13.3 % (ref 11.5–15.5)
WBC: 16.3 10*3/uL — AB (ref 4.0–10.5)

## 2014-06-08 LAB — BASIC METABOLIC PANEL
ANION GAP: 17 — AB (ref 5–15)
BUN: 9 mg/dL (ref 6–23)
CO2: 22 mEq/L (ref 19–32)
CREATININE: 0.74 mg/dL (ref 0.50–1.10)
Calcium: 8.7 mg/dL (ref 8.4–10.5)
Chloride: 102 mEq/L (ref 96–112)
Glucose, Bld: 100 mg/dL — ABNORMAL HIGH (ref 70–99)
Potassium: 3.8 mEq/L (ref 3.7–5.3)
Sodium: 141 mEq/L (ref 137–147)

## 2014-06-08 LAB — URINE MICROSCOPIC-ADD ON

## 2014-06-08 LAB — URINALYSIS, ROUTINE W REFLEX MICROSCOPIC
Bilirubin Urine: NEGATIVE
Glucose, UA: NEGATIVE mg/dL
Ketones, ur: 15 mg/dL — AB
NITRITE: NEGATIVE
PROTEIN: 30 mg/dL — AB
SPECIFIC GRAVITY, URINE: 1.024 (ref 1.005–1.030)
UROBILINOGEN UA: 0.2 mg/dL (ref 0.0–1.0)
pH: 6 (ref 5.0–8.0)

## 2014-06-08 LAB — POC URINE PREG, ED: PREG TEST UR: NEGATIVE

## 2014-06-08 MED ORDER — ONDANSETRON HCL 4 MG PO TABS: 4.0000 mg | ORAL_TABLET | Freq: Four times a day (QID) | ORAL | Status: DC

## 2014-06-08 MED ORDER — TRAMADOL HCL 50 MG PO TABS: 50.0000 mg | ORAL_TABLET | Freq: Four times a day (QID) | ORAL | Status: DC | PRN

## 2014-06-08 MED ORDER — MORPHINE SULFATE 4 MG/ML IJ SOLN
4.0000 mg | Freq: Once | INTRAMUSCULAR | Status: AC
  Administered 2014-06-08: 4 mg via INTRAVENOUS
  Filled 2014-06-08: qty 1

## 2014-06-08 MED ORDER — SODIUM CHLORIDE 0.9 % IV BOLUS (SEPSIS)
1000.0000 mL | Freq: Once | INTRAVENOUS | Status: AC
  Administered 2014-06-08: 1000 mL via INTRAVENOUS

## 2014-06-08 MED ORDER — ONDANSETRON HCL 4 MG/2ML IJ SOLN
4.0000 mg | Freq: Once | INTRAMUSCULAR | Status: AC
  Administered 2014-06-08: 4 mg via INTRAVENOUS
  Filled 2014-06-08: qty 2

## 2014-06-08 NOTE — ED Provider Notes (Signed)
CSN: [id number]     Arrival date & time 06/08/14  0407 History   First MD Initiated Contact with Patient 06/08/14 [phone]     Chief Complaint  Patient presents with  . Back Pain  . Abdominal Pain     (Consider location/radiation/quality/duration/timing/severity/associated sxs/prior Treatment) HPI Patient is a 28 yo woman who presents with LLQ abdominal pain radiating throughout her low abdomen. She awoke from sleep just PTA with severe, aching and cramping pain. She then passed a large clot of blood (she estimates the size of an apricot). No further vaginal bleeding. Her LMP was approximately 21 days ago and she states she has regular periods.   She notes that pain radiates to the low back bilaterally. Denies dysuria, unusual vaginal discharge, fever or history of similar sx. No history of ectopic pregnancy. Nothing makes pain worse. Pain relieved by IV morphine.  Past Medical History  Diagnosis Date  . Hypercalcuria   . Glaucoma    Past Surgical History  Procedure Laterality Date  . Kidney stents     No family history on file. History  Substance Use Topics  . Smoking status: Current Every Day Smoker    Types: Cigarettes  . Smokeless tobacco: Not on file  . Alcohol Use: 2.4 oz/week    4 Shots of liquor per week     Comment: occ   OB History   Grav Para Term Preterm Abortions TAB SAB Ect Mult Living   2 2 2       2      Review of Systems  Ten point review of symptoms performed and is negative with the exception of symptoms noted above.   Allergies  Codeine; Other; Percocet; and Vicodin  Home Medications   Prior to Admission medications   Not on File   BP 107/77  Pulse 61  Temp(Src) 97.9 F (36.6 C) (Oral)  Resp 20  SpO2 96%  LMP 05/19/2014 Physical Exam Gen: well developed and well nourished appearing, appears uncomfortable Head: NCAT Eyes: PERL, EOMI Nose: no epistaixis or rhinorrhea Mouth/throat: mucosa is moist and pink Neck: supple, no stridor Lungs:  CTA B, no wheezing, rhonchi or rales CV: RRR, no murmur, extremities appear well perfused.  Abd: soft, notender, nondistended Back: no ttp, no cva ttp Skin: warm and dry Ext: normal to inspection, no dependent edema Neuro: CN ii-xii grossly intact, no focal deficits Psyche; normal affect,  calm and cooperative.   ED Course  Procedures (including critical care time) Labs Review  Results for orders placed during the hospital encounter of 06/08/14 (from the past 24 hour(s))  URINALYSIS, ROUTINE W REFLEX MICROSCOPIC     Status: Abnormal   Collection Time    06/08/14  4:59 AM      Result Value Ref Range   Color, Urine AMBER (*) YELLOW   APPearance CLOUDY (*) CLEAR   Specific Gravity, Urine 1.024  1.005 - 1.030   pH 6.0  5.0 - 8.0   Glucose, UA NEGATIVE  NEGATIVE mg/dL   Hgb urine dipstick LARGE (*) NEGATIVE   Bilirubin Urine NEGATIVE  NEGATIVE   Ketones, ur 15 (*) NEGATIVE mg/dL   Protein, ur 30 (*) NEGATIVE mg/dL   Urobilinogen, UA 0.2  0.0 - 1.0 mg/dL   Nitrite NEGATIVE  NEGATIVE   Leukocytes, UA TRACE (*) NEGATIVE  URINE MICROSCOPIC-ADD ON     Status: Abnormal   Collection Time    06/08/14  4:59 AM      Result Value Ref Range  Squamous Epithelial / LPF FEW (*) RARE   WBC, UA 3-6  <3 WBC/hpf   RBC / HPF 21-50  <3 RBC/hpf   Bacteria, UA FEW (*) RARE   Urine-Other MUCOUS PRESENT    POC URINE PREG, ED     Status: None   Collection Time    06/08/14  5:10 AM      Result Value Ref Range   Preg Test, Ur NEGATIVE  NEGATIVE  CBC     Status: Abnormal   Collection Time    06/08/14  5:19 AM      Result Value Ref Range   WBC 16.3 (*) 4.0 - 10.5 K/uL   RBC 4.53  3.87 - 5.11 MIL/uL   Hemoglobin 13.9  12.0 - 15.0 g/dL   HCT 42.0  36.0 - 46.0 %   MCV 92.7  78.0 - 100.0 fL   MCH 30.7  26.0 - 34.0 pg   MCHC 33.1  30.0 - 36.0 g/dL   RDW 13.3  11.5 - 15.5 %   Platelets 283  150 - 400 K/uL  BASIC METABOLIC PANEL     Status: Abnormal   Collection Time    06/08/14  5:19 AM       Result Value Ref Range   Sodium 141  137 - 147 mEq/L   Potassium 3.8  3.7 - 5.3 mEq/L   Chloride 102  96 - 112 mEq/L   CO2 22  19 - 32 mEq/L   Glucose, Bld 100 (*) 70 - 99 mg/dL   BUN 9  6 - 23 mg/dL   Creatinine, Ser 0.74  0.50 - 1.10 mg/dL   Calcium 8.7  8.4 - 10.5 mg/dL   GFR calc non Af Amer >90  >90 mL/min   GFR calc Af Amer >90  >90 mL/min   Anion gap 17 (*) 5 - 15   US Pelvis Complete (Final result)  Result time: 06/08/14 07:08:00    Final result by Rad Results In Interface (06/08/14 07:08:00)    Narrative:   CLINICAL DATA: Left lower quadrant abdominal pain. Abnormal vaginal bleeding.  EXAM: TRANSABDOMINAL AND TRANSVAGINAL ULTRASOUND OF PELVIS  TECHNIQUE: Both transabdominal and transvaginal ultrasound examinations of the pelvis were performed. Transabdominal technique was performed for global imaging of the pelvis including uterus, ovaries, adnexal regions, and pelvic cul-de-sac. It was necessary to proceed with endovaginal exam following the transabdominal exam to visualize the uterus and ovaries in greater detail.  COMPARISON: Pelvic ultrasound performed 12/06/2010, and CT of the abdomen and pelvis performed 11/11/2009  FINDINGS: Uterus  Measurements: 8.1 x 4.9 x 5.6 cm. A single partially exophytic 2.2 x 2.0 x 1.7 cm fibroid is noted arising at the left posterior aspect of the uterine fundus.  Endometrium  Thickness: 0.9 cm. No focal abnormality visualized.  Right ovary  Measurements: 4.1 x 1.9 x 1.8 cm. Normal appearance/no adnexal mass.  Left ovary  Measurements: 2.8 x 1.9 x 1.6 cm. Normal appearance/no adnexal mass.  Other findings  A small amount of free fluid is seen within the pelvic cul-de-sac, likely physiologic in nature.  IMPRESSION: 1. Single 2.2 cm partially exophytic fibroid noted arising at the uterine fundus. Uterus otherwise unremarkable in appearance. 2. No evidence for ovarian torsion.   Electronically Signed By: Garald Balding M.D. On: 06/08/2014 07:08          MDM   DDX: ectopic pregnancy, spontaneous abortion, ovarian torsion, UTI/pyelonephritis, ruptured ovarian cyst, PID.    We are managing symptomatically. WBC  noted to be elevated at 16,000. Urine notable for RBCs and trace ketones. Pelvic U/S and pelvic exam pending.  Patient is feeling much better after tx with IV morphine.   0734: pelvic exam: normal external female genitalia, normal vulva and vagina, no vaginal discharge, no CMT, ttp over the LLQ with bimanual exam.   Patient is feeling much better - pain free. I have explained U/S findings and diagnosis of exophytic left posterior uterine fibroid. The patient is stable for discharge with script for po analgesic and referral to [facility] and gynecology. We have discussed return precautions.    Elyn Peers, MD 06/08/14 [phone]

## 2014-06-08 NOTE — ED Notes (Signed)
At approximately 3am this morning patient began having sharp back pain and went to restroom. Patient discharged blood, tissue and white mucous. No bleeding at this time. Patients back pain is in her lower back radiating to her lower abdomen and down into her groin. With more pain on left side. Patient complaining of dizziness, lightheadedness and nausea. LMP at end of June. Gravida 8 Para 2. 138/78, 80 NSR, RR 18, 98% on RA. Pain 9/10.

## 2014-06-16 ENCOUNTER — Ambulatory Visit: Payer: Self-pay | Admitting: Internal Medicine

## 2014-06-22 ENCOUNTER — Ambulatory Visit: Payer: Self-pay | Admitting: Internal Medicine

## 2014-07-07 ENCOUNTER — Ambulatory Visit: Payer: Self-pay

## 2014-08-12 ENCOUNTER — Telehealth (HOSPITAL_BASED_OUTPATIENT_CLINIC_OR_DEPARTMENT_OTHER): Payer: Self-pay | Admitting: Emergency Medicine

## 2014-09-21 ENCOUNTER — Encounter (HOSPITAL_COMMUNITY): Payer: Self-pay | Admitting: Emergency Medicine

## 2014-12-26 ENCOUNTER — Emergency Department (HOSPITAL_COMMUNITY)
Admission: EM | Admit: 2014-12-26 | Discharge: 2014-12-26 | Disposition: A | Discharge status: Home / Self Care | Payer: Self-pay | Attending: Emergency Medicine | Admitting: Emergency Medicine

## 2014-12-26 ENCOUNTER — Encounter (HOSPITAL_COMMUNITY): Payer: Self-pay | Admitting: Emergency Medicine

## 2014-12-26 ENCOUNTER — Emergency Department (HOSPITAL_COMMUNITY): Payer: Self-pay

## 2014-12-26 DIAGNOSIS — Z3202 Encounter for pregnancy test, result negative: Secondary | ICD-10-CM | POA: Insufficient documentation

## 2014-12-26 DIAGNOSIS — N200 Calculus of kidney: Secondary | ICD-10-CM | POA: Insufficient documentation

## 2014-12-26 DIAGNOSIS — Z720 Tobacco use: Secondary | ICD-10-CM | POA: Insufficient documentation

## 2014-12-26 DIAGNOSIS — R110 Nausea: Secondary | ICD-10-CM | POA: Insufficient documentation

## 2014-12-26 DIAGNOSIS — Z8669 Personal history of other diseases of the nervous system and sense organs: Secondary | ICD-10-CM | POA: Insufficient documentation

## 2014-12-26 DIAGNOSIS — R109 Unspecified abdominal pain: Secondary | ICD-10-CM

## 2014-12-26 DIAGNOSIS — Z79899 Other long term (current) drug therapy: Secondary | ICD-10-CM | POA: Insufficient documentation

## 2014-12-26 DIAGNOSIS — Z8639 Personal history of other endocrine, nutritional and metabolic disease: Secondary | ICD-10-CM | POA: Insufficient documentation

## 2014-12-26 LAB — URINALYSIS, ROUTINE W REFLEX MICROSCOPIC
Bilirubin Urine: NEGATIVE
GLUCOSE, UA: NEGATIVE mg/dL
Nitrite: NEGATIVE
PH: 8.5 — AB (ref 5.0–8.0)
SPECIFIC GRAVITY, URINE: 1.015 (ref 1.005–1.030)
Urobilinogen, UA: 0.2 mg/dL (ref 0.0–1.0)

## 2014-12-26 LAB — URINE MICROSCOPIC-ADD ON

## 2014-12-26 LAB — PREGNANCY, URINE: PREG TEST UR: NEGATIVE

## 2014-12-26 MED ORDER — SULFAMETHOXAZOLE-TRIMETHOPRIM 800-160 MG PO TABS: 1.0000 | ORAL_TABLET | Freq: Two times a day (BID) | ORAL | Status: DC

## 2014-12-26 MED ORDER — HYDROMORPHONE HCL 2 MG PO TABS: 2.0000 mg | ORAL_TABLET | ORAL | Status: DC | PRN

## 2014-12-26 MED ORDER — TAMSULOSIN HCL 0.4 MG PO CAPS
0.4000 mg | ORAL_CAPSULE | Freq: Once | ORAL | Status: AC
  Administered 2014-12-26: 0.4 mg via ORAL
  Filled 2014-12-26: qty 1

## 2014-12-26 MED ORDER — HYDROMORPHONE HCL 1 MG/ML IJ SOLN
0.5000 mg | Freq: Once | INTRAMUSCULAR | Status: AC
  Administered 2014-12-26: 10:00:00 via INTRAVENOUS
  Filled 2014-12-26: qty 1

## 2014-12-26 MED ORDER — SODIUM CHLORIDE 0.9 % IV BOLUS (SEPSIS)
1000.0000 mL | Freq: Once | INTRAVENOUS | Status: AC
  Administered 2014-12-26: 1000 mL via INTRAVENOUS

## 2014-12-26 MED ORDER — TAMSULOSIN HCL 0.4 MG PO CAPS: 0.4000 mg | ORAL_CAPSULE | Freq: Every day | ORAL | Status: DC

## 2014-12-26 MED ORDER — ONDANSETRON HCL 4 MG/2ML IJ SOLN
4.0000 mg | Freq: Once | INTRAMUSCULAR | Status: AC
  Administered 2014-12-26: 4 mg via INTRAVENOUS
  Filled 2014-12-26: qty 2

## 2014-12-26 MED ORDER — HYDROGEN PEROXIDE 3 % EX SOLN
CUTANEOUS | Status: AC
  Filled 2014-12-26: qty 473

## 2014-12-26 MED ORDER — HYDROMORPHONE HCL 1 MG/ML IJ SOLN
1.0000 mg | INTRAMUSCULAR | Status: DC | PRN
  Administered 2014-12-26: 1 mg via INTRAVENOUS
  Filled 2014-12-26: qty 1

## 2014-12-26 MED ORDER — ONDANSETRON 4 MG PO TBDP: 4.0000 mg | ORAL_TABLET | Freq: Three times a day (TID) | ORAL | Status: DC | PRN

## 2014-12-26 MED ORDER — KETOROLAC TROMETHAMINE 30 MG/ML IJ SOLN
30.0000 mg | Freq: Once | INTRAMUSCULAR | Status: AC
  Administered 2014-12-26: 30 mg via INTRAVENOUS
  Filled 2014-12-26: qty 1

## 2014-12-26 NOTE — ED Provider Notes (Signed)
CSN: [id number]     Arrival date & time 12/26/14  0713 History  This chart was scribed for Tanna Furry, MD by Evelene Croon, ED Scribe. This patient was seen in room APA07/APA07 and the patient's care was started 7:26 AM.    Chief Complaint  Patient presents with  . Flank Pain    The history is provided by the patient. No language interpreter was used.     HPI Comments:  Jeanne Mcneil is a 29 y.o. female with a h/o hyperclacuria, kidney stones and kidney stents who presents to the Emergency Department complaining of moderate-severe left flank pain that started about 1700 last night and has progressively worsened since onset. She reports associated nausea and vomiting.  She notes she last had kidney stones and CT imaging ~1 yr ago. Her LNMP was ~2weeks ago. No alleviating factors noted.    Past Medical History  Diagnosis Date  . Hypercalcuria   . Glaucoma    Past Surgical History  Procedure Laterality Date  . Kidney stents     No family history on file. History  Substance Use Topics  . Smoking status: Current Every Day Smoker -- 0.50 packs/day    Types: Cigarettes  . Smokeless tobacco: Not on file  . Alcohol Use: 2.4 oz/week    4 Shots of liquor per week     Comment: occ   OB History    Gravida Para Term Preterm AB TAB SAB Ectopic Multiple Living   2 2 2       2      Review of Systems  Constitutional: Negative for fever, chills, diaphoresis, appetite change and fatigue.  HENT: Negative for mouth sores, sore throat and trouble swallowing.   Eyes: Negative for visual disturbance.  Respiratory: Negative for cough, chest tightness, shortness of breath and wheezing.   Cardiovascular: Negative for chest pain.  Gastrointestinal: Positive for nausea and vomiting. Negative for abdominal pain, diarrhea and abdominal distention.  Endocrine: Negative for polydipsia, polyphagia and polyuria.  Genitourinary: Positive for flank pain. Negative for dysuria, frequency and hematuria.   Musculoskeletal: Negative for gait problem.  Skin: Negative for color change, pallor and rash.  Neurological: Negative for dizziness, syncope, light-headedness and headaches.  Hematological: Does not bruise/bleed easily.  Psychiatric/Behavioral: Negative for behavioral problems and confusion.  All other systems reviewed and are negative.     Allergies  Codeine; Other; Percocet; and Vicodin  Home Medications   Prior to Admission medications   Medication Sig Start Date End Date Taking? Authorizing Provider  acetaminophen (TYLENOL) 500 MG tablet Take 1,000 mg by mouth every 6 (six) hours as needed for mild pain.   Yes Historical Provider, MD  Cranberry 500 MG CAPS Take 1 capsule by mouth daily.   Yes Historical Provider, MD  diphenhydrAMINE (BENADRYL) 25 mg capsule Take 25 mg by mouth every 6 (six) hours as needed for itching or allergies.   Yes Historical Provider, MD  ibuprofen (ADVIL,MOTRIN) 200 MG tablet Take 800 mg by mouth every 6 (six) hours as needed for moderate pain or cramping.   Yes Historical Provider, MD  Multiple Vitamin (MULTIVITAMIN WITH MINERALS) TABS tablet Take 1 tablet by mouth daily.   Yes Historical Provider, MD  vitamin C (ASCORBIC ACID) 500 MG tablet Take 500 mg by mouth daily.   Yes Historical Provider, MD  HYDROmorphone (DILAUDID) 2 MG tablet Take 1 tablet (2 mg total) by mouth every 4 (four) hours as needed. 12/26/14   Tanna Furry, MD  ondansetron [facility]  ODT) 4 MG disintegrating tablet Take 1 tablet (4 mg total) by mouth every 8 (eight) hours as needed for nausea. 12/26/14   Tanna Furry, MD  ondansetron (ZOFRAN) 4 MG tablet Take 1 tablet (4 mg total) by mouth every 6 (six) hours. Patient not taking: Reported on 12/26/2014 06/08/14   Elyn Peers, MD  tamsulosin (FLOMAX) 0.4 MG CAPS capsule Take 1 capsule (0.4 mg total) by mouth daily. 12/26/14   Tanna Furry, MD  traMADol (ULTRAM) 50 MG tablet Take 1 tablet (50 mg total) by mouth every 6 (six) hours as needed. Patient not  taking: Reported on 12/26/2014 06/08/14   Elyn Peers, MD   BP 110/78 mmHg  Pulse 80  Temp(Src) 98 F (36.7 C) (Oral)  Resp 20  Ht 5' 2"  (1.575 m)  Wt 150 lb (68.04 kg)  BMI 27.43 kg/m2  SpO2 100%  LMP 12/09/2014 Physical Exam  Constitutional: She is oriented to person, place, and time. She appears well-developed and well-nourished. No distress.  writhing in pain   HENT:  Head: Normocephalic.  Eyes: Conjunctivae are normal. Pupils are equal, round, and reactive to light. No scleral icterus.  Neck: Normal range of motion. Neck supple. No thyromegaly present.  Cardiovascular: Normal rate and regular rhythm.  Exam reveals no gallop and no friction rub.   No murmur heard. Pulmonary/Chest: Effort normal and breath sounds normal. No respiratory distress. She has no wheezes. She has no rales.  Abdominal: Soft. Bowel sounds are normal. She exhibits no distension. There is no tenderness. There is no rebound.  Genitourinary:  Reports pain at left flank   Musculoskeletal: Normal range of motion.  Neurological: She is alert and oriented to person, place, and time.  Skin: Skin is warm and dry. No rash noted.  Psychiatric: She has a normal mood and affect. Her behavior is normal.  Nursing note and vitals reviewed.   ED Course  Procedures    DIAGNOSTIC STUDIES:  Oxygen Saturation is 96% on RA, normal by my interpretation.    COORDINATION OF CARE:  7:32 AM Will order pain meds. Discussed treatment plan with pt at bedside and pt agreed to plan.  Labs Review Labs Reviewed  URINALYSIS, ROUTINE W REFLEX MICROSCOPIC - Abnormal; Notable for the following:    APPearance CLOUDY (*)    pH 8.5 (*)    Hgb urine dipstick MODERATE (*)    Ketones, ur TRACE (*)    Protein, ur TRACE (*)    Leukocytes, UA SMALL (*)    All other components within normal limits  URINE MICROSCOPIC-ADD ON - Abnormal; Notable for the following:    Squamous Epithelial / LPF FEW (*)    Bacteria, UA MANY (*)    All  other components within normal limits  PREGNANCY, URINE    Imaging Review Ct Renal Stone Study  12/26/2014   CLINICAL DATA:  Left-sided flank pain.  EXAM: CT ABDOMEN AND PELVIS WITHOUT CONTRAST  TECHNIQUE: Multidetector CT imaging of the abdomen and pelvis was performed following the standard protocol without IV contrast.  COMPARISON:  CT abdomen pelvis - 11/11/2009 ; pelvic ultrasound- 06/08/2014  FINDINGS: The lack of intravenous contrast limits the ability to evaluate solid abdominal organs.  There is a punctate (approximately 0.4 x 0.2 cm) stone within the distal aspect of the left ureter at the level of the left UVJ which results in mild to moderate upstream ureterectasis and pelvicaliectasis. No additional left-sided renal stones are identified.  There are multiple ill-defined ill-defined punctate (1-2 mm)  calcifications scattered throughout the right kidney (representative calcification within the superior pole of the right kidney seen on image 25, series 2; midpole-image 30 and inferior pole-image 32). Additionally, there is slightly increased attenuation of the right-sided renal calices which may be indicative of nephrocalcinosis. No evidence of right-sided urinary obstruction. No stones are seen along the expected location of the right ureter.  Normal noncontrast appearance of the urinary bladder given degree distention. Multiple phleboliths are seen within the lower pelvis bilaterally, left greater than right.   ------------------------------------------------------------------------  Normal hepatic contour. Normal noncontrast appearance of the gallbladder given degree distention. No radiopaque gallstones. No ascites.  Normal noncontrast appearance of the bilateral adrenal glands, pancreas and spleen.  Scattered minimal colonic diverticulosis without evidence of diverticulitis on this noncontrast examination. Normal noncontrast appearance of the appendix. No pneumoperitoneum, pneumatosis or portal  venous gas.  Normal caliber the abdominal aorta. No bulky retroperitoneal, mesenteric, pelvic or inguinal lymphadenopathy on this noncontrast examination. The uterus is noted to be retroverted. There is a small amount of physiologic fluid within the endometrial canal.  Approximately 2.7 x 2.5 cm fibroid arising from the left side of the posterior aspect of the uterine fundus (image 65, series 2), incompletely evaluated on this noncontrast examination, though morphologically similar to pelvic ultrasound performed 06/10/2014. No discrete adnexal lesion. No free fluid in the pelvic cul-de-sac.  Limited visualization of lower thorax demonstrates minimal bibasilar dependent subpleural ground-glass atelectasis, left greater than right. No discrete focal airspace opacities. No pleural effusion.  Normal heart size.  No pericardial effusion.  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 No acute osseus abnormalities. Regional soft tissues appear normal.  IMPRESSION: 1. Punctate (approximately 0.4 cm) stone within the distal aspect of the left ureter results in mild-to-moderate upstream left-sided ureterectasis and pelvicaliectasis. 2. No additional left-sided renal stones identified. 3. Multiple punctate (1-2 mm) stones are scattered within the right kidney with increased attenuation of the right renal calices suggestive nephrocalcinosis. No evidence of right-sided urinary obstruction. 4. Minimal colonic diverticulosis without evidence of diverticulitis on this noncontrast examination. 5. The approximately 2.7 cm partially exophytic fibroid is incompletely evaluated on the present examination though appears morphologically similar to pelvic ultrasound performed 06/10/2014.   Electronically Signed   By: Sandi Mariscal M.D.   On: 12/26/2014 09:13     EKG Interpretation None      MDM   Final diagnoses:  Lt flank pain  Kidney stone    Patient has a 4 mm stone at the left distal  ureter with hydronephrosis. Symptoms are controlled. Given additional dose of pain medication before discharge. Plan is home, fluid hydration, urological follow-up, by mouth Dilaudid when necessary, Zofran, return if any worsening symptoms.   I personally performed the services described in this documentation, which was scribed in my presence. The recorded information has been reviewed and is accurate.   Tanna Furry, MD 12/26/14 [phone]

## 2014-12-26 NOTE — ED Notes (Signed)
PT c/o left flank pain and burning with urination with nausea and vomiting x1 day with reported hx of kidney stones.

## 2014-12-26 NOTE — Discharge Instructions (Signed)
Kidney Stones Kidney stones (urolithiasis) are solid masses that form inside your kidneys. The intense pain is caused by the stone moving through the kidney, ureter, bladder, and urethra (urinary tract). When the stone moves, the ureter starts to spasm around the stone. The stone is usually passed in your pee (urine).  HOME CARE  Drink enough fluids to keep your pee clear or pale yellow. This helps to get the stone out.  Strain all pee through the provided strainer. Do not pee without peeing through the strainer, not even once. If you pee the stone out, catch it in the strainer. The stone may be as small as a grain of salt. Take this to your doctor. This will help your doctor figure out what you can do to try to prevent more kidney stones.  Only take medicine as told by your doctor.  Follow up with your doctor as told.  Get follow-up X-rays as told by your doctor. GET HELP IF: You have pain that gets worse even if you have been taking pain medicine. GET HELP RIGHT AWAY IF:   Your pain does not get better with medicine.  You have a fever or shaking chills.  Your pain increases and gets worse over 18 hours.  You have new belly (abdominal) pain.  You feel faint or pass out.  You are unable to pee. MAKE SURE YOU:   Understand these instructions.  Will watch your condition.  Will get help right away if you are not doing well or get worse. Document Released: 04/24/2008 Document Revised: 07/09/2013 Document Reviewed: 04/09/2013 [facility] Patient Information 2015 [place], [place]. This information is not intended to replace advice given to you by your health care provider. Make sure you discuss any questions you have with your health care provider.

## 2014-12-26 NOTE — ED Notes (Signed)
MD at bedside. 

## 2014-12-28 LAB — URINE CULTURE

## 2015-01-09 ENCOUNTER — Encounter (HOSPITAL_COMMUNITY): Payer: Self-pay | Admitting: *Deleted

## 2015-01-09 ENCOUNTER — Inpatient Hospital Stay (HOSPITAL_COMMUNITY)
Admission: AD | Admit: 2015-01-09 | Discharge: 2015-01-09 | Disposition: A | Discharge status: Home / Self Care | Payer: Self-pay | Source: Ambulatory Visit | Attending: Obstetrics & Gynecology | Admitting: Obstetrics & Gynecology

## 2015-01-09 ENCOUNTER — Inpatient Hospital Stay (HOSPITAL_COMMUNITY): Payer: Self-pay

## 2015-01-09 DIAGNOSIS — A5901 Trichomonal vulvovaginitis: Secondary | ICD-10-CM | POA: Insufficient documentation

## 2015-01-09 DIAGNOSIS — A599 Trichomoniasis, unspecified: Secondary | ICD-10-CM

## 2015-01-09 DIAGNOSIS — N83209 Unspecified ovarian cyst, unspecified side: Secondary | ICD-10-CM

## 2015-01-09 DIAGNOSIS — N8320 Unspecified ovarian cysts: Secondary | ICD-10-CM | POA: Insufficient documentation

## 2015-01-09 DIAGNOSIS — R1032 Left lower quadrant pain: Secondary | ICD-10-CM | POA: Insufficient documentation

## 2015-01-09 DIAGNOSIS — F17210 Nicotine dependence, cigarettes, uncomplicated: Secondary | ICD-10-CM | POA: Insufficient documentation

## 2015-01-09 LAB — URINALYSIS, ROUTINE W REFLEX MICROSCOPIC
Bilirubin Urine: NEGATIVE
Glucose, UA: NEGATIVE mg/dL
Ketones, ur: NEGATIVE mg/dL
LEUKOCYTES UA: NEGATIVE
Nitrite: NEGATIVE
PROTEIN: 30 mg/dL — AB
Specific Gravity, Urine: >1.03 — ABNORMAL HIGH (ref 1.005–1.030)
Urobilinogen, UA: 0.2 mg/dL (ref 0.0–1.0)
pH: 6 (ref 5.0–8.0)

## 2015-01-09 LAB — CBC
HEMATOCRIT: 38.7 % (ref 36.0–46.0)
Hemoglobin: 13 g/dL (ref 12.0–15.0)
MCH: 31.1 pg (ref 26.0–34.0)
MCHC: 33.6 g/dL (ref 30.0–36.0)
MCV: 92.6 fL (ref 78.0–100.0)
PLATELETS: 264 10*3/uL (ref 150–400)
RBC: 4.18 MIL/uL (ref 3.87–5.11)
RDW: 13.3 % (ref 11.5–15.5)
WBC: 13.8 10*3/uL — AB (ref 4.0–10.5)

## 2015-01-09 LAB — URINE MICROSCOPIC-ADD ON

## 2015-01-09 LAB — WET PREP, GENITAL
Clue Cells Wet Prep HPF POC: NONE SEEN
Yeast Wet Prep HPF POC: NONE SEEN

## 2015-01-09 LAB — POCT PREGNANCY, URINE: PREG TEST UR: NEGATIVE

## 2015-01-09 MED ORDER — LACTATED RINGERS IV BOLUS (SEPSIS)
1000.0000 mL | Freq: Once | INTRAVENOUS | Status: AC
  Administered 2015-01-09: 1000 mL via INTRAVENOUS

## 2015-01-09 MED ORDER — HYDROMORPHONE HCL 2 MG/ML IJ SOLN
2.0000 mg | Freq: Once | INTRAMUSCULAR | Status: AC
  Administered 2015-01-09: 2 mg via INTRAVENOUS
  Filled 2015-01-09: qty 1

## 2015-01-09 MED ORDER — TRAMADOL HCL 50 MG PO TABS: 50.0000 mg | ORAL_TABLET | Freq: Four times a day (QID) | ORAL | Status: DC | PRN

## 2015-01-09 MED ORDER — METRONIDAZOLE 500 MG PO TABS: 2000.0000 mg | ORAL_TABLET | Freq: Once | ORAL | Status: DC

## 2015-01-09 MED ORDER — ONDANSETRON HCL 4 MG/2ML IJ SOLN
4.0000 mg | Freq: Once | INTRAMUSCULAR | Status: AC
  Administered 2015-01-09: 4 mg via INTRAVENOUS
  Filled 2015-01-09: qty 2

## 2015-01-09 MED ORDER — TRAMADOL HCL 50 MG PO TABS
100.0000 mg | ORAL_TABLET | Freq: Once | ORAL | Status: AC
  Administered 2015-01-09: 100 mg via ORAL
  Filled 2015-01-09: qty 2

## 2015-01-09 NOTE — MAU Note (Addendum)
Pt awoke from sleep around 0330 with sharp left sided abdominal/back pain, n/v, diarrhea.  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

## 2015-01-09 NOTE — MAU Provider Note (Signed)
Chief Complaint: Flank Pain and Nausea   First Provider Initiated Contact with Patient 01/09/15 [phone]     SUBJECTIVE HPI: Jeanne Mcneil is a 29 y.o. [phone] nonpregnant female who presents with sharp left abdominal pain wrapping around to left low back since 3:30 AM. Also reports nausea vomiting and diarrhea 3 each since 3:30 AM. Had 3 episodes of the small amount of watery diarrhea. Denies fever, chills, bloody stools. Has history of kidney stones and has had stent in the past. Last seen at [facility] ED 12/26/2014. CT showed left kidney stones. Patient sent home with Dilaudid for pain and Bactrim for UTI, but culture came back essentially negative.This has some similarities and some difference to her usual kidney stone pain.  Also wondering if her pain could be coming from her fibroid.  Patient is currently on her menstrual cycle   Past Medical History  Diagnosis Date  . Hypercalcuria   . Glaucoma   . Fibroid uterus     fibroid lymphoma   OB History  Gravida Para Term Preterm AB SAB TAB Ectopic Multiple Living  2 2 2       2     # Outcome Date GA Lbr Len/2nd Weight Sex Delivery Anes PTL Lv  2 Term 2007     Vag-Spont   Y  1 Term 2003     Vag-Spont   Y     Past Surgical History  Procedure Laterality Date  . Kidney stents     History   Social History  . Marital Status: Single    Spouse Name: N/A  . Number of Children: N/A  . Years of Education: N/A   Occupational History  . Not on file.   Social History Main Topics  . Smoking status: Current Every Day Smoker -- 0.50 packs/day    Types: Cigarettes  . Smokeless tobacco: Not on file  . Alcohol Use: 2.4 oz/week    4 Shots of liquor per week     Comment: occ  . Drug Use: Yes    Special: Marijuana     Comment: hx  . Sexual Activity: Yes    Birth Control/ Protection: None   Other Topics Concern  . Not on file   Social History Narrative   No current facility-administered medications on file prior to encounter.    Current Outpatient Prescriptions on File Prior to Encounter  Medication Sig Dispense Refill  . acetaminophen (TYLENOL) 500 MG tablet Take 1,000 mg by mouth every 6 (six) hours as needed for mild pain.    . Cranberry 500 MG CAPS Take 1 capsule by mouth daily.    . diphenhydrAMINE (BENADRYL) 25 mg capsule Take 25 mg by mouth every 6 (six) hours as needed for itching or allergies.    Marland Kitchen HYDROmorphone (DILAUDID) 2 MG tablet Take 1 tablet (2 mg total) by mouth every 4 (four) hours as needed. 20 tablet 0  . ibuprofen (ADVIL,MOTRIN) 200 MG tablet Take 800 mg by mouth every 6 (six) hours as needed for moderate pain or cramping.    . Multiple Vitamin (MULTIVITAMIN WITH MINERALS) TABS tablet Take 1 tablet by mouth daily.    Marland Kitchen sulfamethoxazole-trimethoprim (SEPTRA DS) 800-160 MG per tablet Take 1 tablet by mouth every 12 (twelve) hours. 10 tablet 0  . tamsulosin (FLOMAX) 0.4 MG CAPS capsule Take 1 capsule (0.4 mg total) by mouth daily. 7 capsule 0  . vitamin C (ASCORBIC ACID) 500 MG tablet Take 500 mg by mouth daily.    Marland Kitchen  ondansetron (ZOFRAN ODT) 4 MG disintegrating tablet Take 1 tablet (4 mg total) by mouth every 8 (eight) hours as needed for nausea. 6 tablet 0  . ondansetron (ZOFRAN) 4 MG tablet Take 1 tablet (4 mg total) by mouth every 6 (six) hours. (Patient not taking: Reported on 12/26/2014) 12 tablet 0  . traMADol (ULTRAM) 50 MG tablet Take 1 tablet (50 mg total) by mouth every 6 (six) hours as needed. (Patient not taking: Reported on 12/26/2014) 15 tablet 0   Allergies  Allergen Reactions  . Codeine Hives and Itching    Able to take Dilaudid and Ultram.  . Other     grass  . Percocet [Oxycodone-Acetaminophen] Itching  . Vicodin [Hydrocodone-Acetaminophen] Itching    Review of Systems  Constitutional: Negative for fever and chills.  Gastrointestinal: Positive for nausea, vomiting, abdominal pain and diarrhea. Negative for constipation and blood in stool.  Genitourinary: Positive for dysuria  and hematuria. Negative for urgency, frequency and flank pain.  Musculoskeletal: Negative for myalgias.   OBJECTIVE Blood pressure 121/59, pulse 78, temperature 97 F (36.1 C), temperature source Oral, resp. rate 16, last menstrual period 01/06/2015, SpO2 100 %. GENERAL: Well-developed, well-nourished female in moderate distress.  HEENT: Normocephalic HEART: normal rate RESP: normal effort ABDOMEN: Soft, left lower quadrant tenderness from level of umbilicus down. Questionable stool versus mass palpated. Positive bowel sounds 4. Negative CVA tenderness. EXTREMITIES: Nontender, no edema NEURO: Alert and oriented  LAB RESULTS Results for orders placed or performed during the hospital encounter of 01/09/15 (from the past 24 hour(s))  Urinalysis, Routine w reflex microscopic     Status: Abnormal   Collection Time: 01/09/15  5:35 AM  Result Value Ref Range   Color, Urine RED (A) YELLOW   APPearance TURBID (A) CLEAR   Specific Gravity, Urine >1.030 (H) 1.005 - 1.030   pH 6.0 5.0 - 8.0   Glucose, UA NEGATIVE NEGATIVE mg/dL   Hgb urine dipstick LARGE (A) NEGATIVE   Bilirubin Urine NEGATIVE NEGATIVE   Ketones, ur NEGATIVE NEGATIVE mg/dL   Protein, ur 30 (A) NEGATIVE mg/dL   Urobilinogen, UA 0.2 0.0 - 1.0 mg/dL   Nitrite NEGATIVE NEGATIVE   Leukocytes, UA NEGATIVE NEGATIVE  Urine microscopic-add on     Status: Abnormal   Collection Time: 01/09/15  5:35 AM  Result Value Ref Range   Squamous Epithelial / LPF FEW (A) RARE   WBC, UA 3-6 <3 WBC/hpf   RBC / HPF TOO NUMEROUS TO COUNT <3 RBC/hpf   Urine-Other TRICHOMONAS PRESENT   Pregnancy, urine POC     Status: None   Collection Time: 01/09/15  5:46 AM  Result Value Ref Range   Preg Test, Ur NEGATIVE NEGATIVE  CBC     Status: Abnormal   Collection Time: 01/09/15  7:05 AM  Result Value Ref Range   WBC 13.8 (H) 4.0 - 10.5 K/uL   RBC 4.18 3.87 - 5.11 MIL/uL   Hemoglobin 13.0 12.0 - 15.0 g/dL   HCT 38.7 36.0 - 46.0 %   MCV 92.6 78.0 -  100.0 fL   MCH 31.1 26.0 - 34.0 pg   MCHC 33.6 30.0 - 36.0 g/dL   RDW 13.3 11.5 - 15.5 %   Platelets 264 150 - 400 K/uL    IMAGING US Transvaginal Non-ob  01/09/2015   CLINICAL DATA:  Left-sided pelvic and back pain. Nausea, vomiting and diarrhea.  EXAM: TRANSABDOMINAL AND TRANSVAGINAL ULTRASOUND OF PELVIS  DOPPLER ULTRASOUND OF OVARIES  TECHNIQUE: Both transabdominal and transvaginal  ultrasound examinations of the pelvis were performed. Transabdominal technique was performed for global imaging of the pelvis including uterus, ovaries, adnexal regions, and pelvic cul-de-sac.  It was necessary to proceed with endovaginal exam following the transabdominal exam to visualize the ovaries and endometrium. Color and duplex Doppler ultrasound was utilized to evaluate blood flow to the ovaries.  COMPARISON:  CT scan 12/26/2014 and prior ultrasound 06/08/2014  FINDINGS: Uterus  Measurements: 7.9 x 5.0 x 5.5 cm. Stable exophytic fundal fibroid measuring approximately 2 cm.  Endometrium  Thickness: 9.0 mm.  No focal abnormality visualized.  Right ovary  Measurements: 4.4 x 2.0 x 3.5 cm. Complex 2.6 x 2.3 x 2.6 cm cyst which is likely a hemorrhagic cyst.  Left ovary  Measurements: 2.8 x 1.8 x 2.5 cm. Normal appearance/no adnexal mass.  Pulsed Doppler evaluation of both ovaries demonstrates normal low-resistance arterial and venous waveforms.  Other findings  Trace free pelvic fluid.  IMPRESSION: Stable 2 cm exophytic fundal fibroid.  Normal endometrium.  2.6 cm hemorrhagic cyst associated with the right ovary.   Electronically Signed   By: Marijo Sanes M.D.   On: 01/09/2015 08:28   US Pelvis Complete  01/09/2015   CLINICAL DATA:  Left-sided pelvic and back pain. Nausea, vomiting and diarrhea.  EXAM: TRANSABDOMINAL AND TRANSVAGINAL ULTRASOUND OF PELVIS  DOPPLER ULTRASOUND OF OVARIES  TECHNIQUE: Both transabdominal and transvaginal ultrasound examinations of the pelvis were performed. Transabdominal technique was  performed for global imaging of the pelvis including uterus, ovaries, adnexal regions, and pelvic cul-de-sac.  It was necessary to proceed with endovaginal exam following the transabdominal exam to visualize the ovaries and endometrium. Color and duplex Doppler ultrasound was utilized to evaluate blood flow to the ovaries.  COMPARISON:  CT scan 12/26/2014 and prior ultrasound 06/08/2014  FINDINGS: Uterus  Measurements: 7.9 x 5.0 x 5.5 cm. Stable exophytic fundal fibroid measuring approximately 2 cm.  Endometrium  Thickness: 9.0 mm.  No focal abnormality visualized.  Right ovary  Measurements: 4.4 x 2.0 x 3.5 cm. Complex 2.6 x 2.3 x 2.6 cm cyst which is likely a hemorrhagic cyst.  Left ovary  Measurements: 2.8 x 1.8 x 2.5 cm. Normal appearance/no adnexal mass.  Pulsed Doppler evaluation of both ovaries demonstrates normal low-resistance arterial and venous waveforms.  Other findings  Trace free pelvic fluid.  IMPRESSION: Stable 2 cm exophytic fundal fibroid.  Normal endometrium.  2.6 cm hemorrhagic cyst associated with the right ovary.   Electronically Signed   By: Marijo Sanes M.D.   On: 01/09/2015 08:28   [place] Art/ven Flow Abd Pelv Doppler  01/09/2015   CLINICAL DATA:  Left-sided pelvic and back pain. Nausea, vomiting and diarrhea.  EXAM: TRANSABDOMINAL AND TRANSVAGINAL ULTRASOUND OF PELVIS  DOPPLER ULTRASOUND OF OVARIES  TECHNIQUE: Both transabdominal and transvaginal ultrasound examinations of the pelvis were performed. Transabdominal technique was performed for global imaging of the pelvis including uterus, ovaries, adnexal regions, and pelvic cul-de-sac.  It was necessary to proceed with endovaginal exam following the transabdominal exam to visualize the ovaries and endometrium. Color and duplex Doppler ultrasound was utilized to evaluate blood flow to the ovaries.  COMPARISON:  CT scan 12/26/2014 and prior ultrasound 06/08/2014  FINDINGS: Uterus  Measurements: 7.9 x 5.0 x 5.5 cm. Stable exophytic fundal  fibroid measuring approximately 2 cm.  Endometrium  Thickness: 9.0 mm.  No focal abnormality visualized.  Right ovary  Measurements: 4.4 x 2.0 x 3.5 cm. Complex 2.6 x 2.3 x 2.6 cm cyst which is likely a  hemorrhagic cyst.  Left ovary  Measurements: 2.8 x 1.8 x 2.5 cm. Normal appearance/no adnexal mass.  Pulsed Doppler evaluation of both ovaries demonstrates normal low-resistance arterial and venous waveforms.  Other findings  Trace free pelvic fluid.  IMPRESSION: Stable 2 cm exophytic fundal fibroid.  Normal endometrium.  2.6 cm hemorrhagic cyst associated with the right ovary.   Electronically Signed   By: Marijo Sanes M.D.   On: 01/09/2015 08:28   Ct Renal Stone Study  12/26/2014   CLINICAL DATA:  Left-sided flank pain.  EXAM: CT ABDOMEN AND PELVIS WITHOUT CONTRAST  TECHNIQUE: Multidetector CT imaging of the abdomen and pelvis was performed following the standard protocol without IV contrast.  COMPARISON:  CT abdomen pelvis - 11/11/2009 ; pelvic ultrasound- 06/08/2014  FINDINGS: The lack of intravenous contrast limits the ability to evaluate solid abdominal organs.  There is a punctate (approximately 0.4 x 0.2 cm) stone within the distal aspect of the left ureter at the level of the left UVJ which results in mild to moderate upstream ureterectasis and pelvicaliectasis. No additional left-sided renal stones are identified.  There are multiple ill-defined ill-defined punctate (1-2 mm) calcifications scattered throughout the right kidney (representative calcification within the superior pole of the right kidney seen on image 25, series 2; midpole-image 30 and inferior pole-image 32). Additionally, there is slightly increased attenuation of the right-sided renal calices which may be indicative of nephrocalcinosis. No evidence of right-sided urinary obstruction. No stones are seen along the expected location of the right ureter.  Normal noncontrast appearance of the urinary bladder given degree distention. Multiple  phleboliths are seen within the lower pelvis bilaterally, left greater than right.   ------------------------------------------------------------------------  Normal hepatic contour. Normal noncontrast appearance of the gallbladder given degree distention. No radiopaque gallstones. No ascites.  Normal noncontrast appearance of the bilateral adrenal glands, pancreas and spleen.  Scattered minimal colonic diverticulosis without evidence of diverticulitis on this noncontrast examination. Normal noncontrast appearance of the appendix. No pneumoperitoneum, pneumatosis or portal venous gas.  Normal caliber the abdominal aorta. No bulky retroperitoneal, mesenteric, pelvic or inguinal lymphadenopathy on this noncontrast examination. The uterus is noted to be retroverted. There is a small amount of physiologic fluid within the endometrial canal.  Approximately 2.7 x 2.5 cm fibroid arising from the left side of the posterior aspect of the uterine fundus (image 65, series 2), incompletely evaluated on this noncontrast examination, though morphologically similar to pelvic ultrasound performed 06/10/2014. No discrete adnexal lesion. No free fluid in the pelvic cul-de-sac.  Limited visualization of lower thorax demonstrates minimal bibasilar dependent subpleural ground-glass atelectasis, left greater than right. No discrete focal airspace opacities. No pleural effusion.  Normal heart size.  No pericardial effusion.  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 No acute osseus abnormalities. Regional soft tissues appear normal.  IMPRESSION: 1. Punctate (approximately 0.4 cm) stone within the distal aspect of the left ureter results in mild-to-moderate upstream left-sided ureterectasis and pelvicaliectasis. 2. No additional left-sided renal stones identified. 3. Multiple punctate (1-2 mm) stones are scattered within the right kidney with increased attenuation of the right renal calices suggestive  nephrocalcinosis. No evidence of right-sided urinary obstruction. 4. Minimal colonic diverticulosis without evidence of diverticulitis on this noncontrast examination. 5. The approximately 2.7 cm partially exophytic fibroid is incompletely evaluated on the present examination though appears morphologically similar to pelvic ultrasound performed 06/10/2014.   Electronically Signed   By: Sandi Mariscal M.D.   On: 12/26/2014 09:13  MAU COURSE CBC, UA, Zofran, LR bolus, Dilaudid, pelvic ultrasound with Dopplers. Discussed Trichomonas found on urine. We'll treat in MAU if nausea and vomiting controlled well with Zofran otherwise [place] patient home with prescription for Flagyl. Informed patient that her partner needs to be treated and that they should not have intercourse until one week after both patient and partner are treated.  Care of patient turned over to Noni Saupe, NP at 8:20 AM. Patient in ultrasound. Patient received 2 tramadol prior to discharge home   ASSESSMENT 1. Trichomonas infection   2. LLQ pain   3      Right hemorraghic cyst 2.6 cm   PLAN  Discharge home in stable condition RX: Tramadol, Flagyl  Patient is encouraged to follow up with PCP Partner needs treatment and is encouraged to call HD   [place], CNM 01/09/2015  6:25 AM   [place], NP 01/09/2015 3:49 PM

## 2015-01-10 LAB — URINE CULTURE
Colony Count: 40000
SPECIAL REQUESTS: NORMAL

## 2015-01-11 LAB — HIV ANTIBODY (ROUTINE TESTING W REFLEX): HIV SCREEN 4TH GENERATION: NONREACTIVE

## 2015-01-11 LAB — GC/CHLAMYDIA PROBE AMP (~~LOC~~) NOT AT ARMC
Chlamydia: NEGATIVE
Neisseria Gonorrhea: NEGATIVE

## 2015-03-05 ENCOUNTER — Encounter (HOSPITAL_COMMUNITY): Payer: Self-pay | Admitting: *Deleted

## 2015-03-05 ENCOUNTER — Inpatient Hospital Stay (HOSPITAL_COMMUNITY)
Admission: AD | Admit: 2015-03-05 | Discharge: 2015-03-05 | Disposition: A | Discharge status: Home / Self Care | Payer: Self-pay | Source: Ambulatory Visit | Attending: Obstetrics & Gynecology | Admitting: Obstetrics & Gynecology

## 2015-03-05 DIAGNOSIS — R1030 Lower abdominal pain, unspecified: Secondary | ICD-10-CM | POA: Insufficient documentation

## 2015-03-05 DIAGNOSIS — F17210 Nicotine dependence, cigarettes, uncomplicated: Secondary | ICD-10-CM | POA: Insufficient documentation

## 2015-03-05 DIAGNOSIS — A599 Trichomoniasis, unspecified: Secondary | ICD-10-CM

## 2015-03-05 DIAGNOSIS — N939 Abnormal uterine and vaginal bleeding, unspecified: Secondary | ICD-10-CM | POA: Insufficient documentation

## 2015-03-05 DIAGNOSIS — Z3201 Encounter for pregnancy test, result positive: Secondary | ICD-10-CM | POA: Insufficient documentation

## 2015-03-05 DIAGNOSIS — O209 Hemorrhage in early pregnancy, unspecified: Secondary | ICD-10-CM

## 2015-03-05 DIAGNOSIS — A5901 Trichomonal vulvovaginitis: Secondary | ICD-10-CM | POA: Insufficient documentation

## 2015-03-05 LAB — URINALYSIS, ROUTINE W REFLEX MICROSCOPIC
BILIRUBIN URINE: NEGATIVE
Glucose, UA: NEGATIVE mg/dL
KETONES UR: NEGATIVE mg/dL
Leukocytes, UA: NEGATIVE
NITRITE: NEGATIVE
PROTEIN: NEGATIVE mg/dL
Specific Gravity, Urine: >1.03 — ABNORMAL HIGH (ref 1.005–1.030)
UROBILINOGEN UA: 0.2 mg/dL (ref 0.0–1.0)
pH: 6 (ref 5.0–8.0)

## 2015-03-05 LAB — HCG, QUANTITATIVE, PREGNANCY: hCG, Beta Chain, Quant, S: 60 m[IU]/mL — ABNORMAL HIGH (ref <5)

## 2015-03-05 LAB — CBC
HEMATOCRIT: 40.6 % (ref 36.0–46.0)
HEMOGLOBIN: 13.4 g/dL (ref 12.0–15.0)
MCH: 30.3 pg (ref 26.0–34.0)
MCHC: 33 g/dL (ref 30.0–36.0)
MCV: 91.9 fL (ref 78.0–100.0)
Platelets: 291 10*3/uL (ref 150–400)
RBC: 4.42 MIL/uL (ref 3.87–5.11)
RDW: 13.2 % (ref 11.5–15.5)
WBC: 8.3 10*3/uL (ref 4.0–10.5)

## 2015-03-05 LAB — URINE MICROSCOPIC-ADD ON

## 2015-03-05 LAB — WET PREP, GENITAL
CLUE CELLS WET PREP: NONE SEEN
YEAST WET PREP: NONE SEEN

## 2015-03-05 LAB — POCT PREGNANCY, URINE: Preg Test, Ur: POSITIVE — AB

## 2015-03-05 MED ORDER — METRONIDAZOLE 500 MG PO TABS: 2000.0000 mg | ORAL_TABLET | Freq: Once | ORAL | Status: DC

## 2015-03-05 MED ORDER — METRONIDAZOLE 500 MG PO TABS
2000.0000 mg | ORAL_TABLET | Freq: Once | ORAL | Status: AC
  Administered 2015-03-05: 2000 mg via ORAL
  Filled 2015-03-05: qty 4

## 2015-03-05 NOTE — Discharge Instructions (Signed)
Threatened Miscarriage A threatened miscarriage occurs when you have vaginal bleeding during your first 20 weeks of pregnancy but the pregnancy has not ended. If you have vaginal bleeding during this time, your health care provider will do tests to make sure you are still pregnant. If the tests show you are still pregnant and the developing baby (fetus) inside your womb (uterus) is still growing, your condition is considered a threatened miscarriage. A threatened miscarriage does not mean your pregnancy will end, but it does increase the risk of losing your pregnancy (complete miscarriage). CAUSES  The cause of a threatened miscarriage is usually not known. If you go on to have a complete miscarriage, the most common cause is an abnormal number of chromosomes in the developing baby. Chromosomes are the structures inside cells that hold all your genetic material. Some causes of vaginal bleeding that do not result in miscarriage include:  Having sex.  Having an infection.  Normal hormone changes of pregnancy.  Bleeding that occurs when an egg implants in your uterus. RISK FACTORS Risk factors for bleeding in early pregnancy include:  Obesity.  Smoking.  Drinking excessive amounts of alcohol or caffeine.  Recreational drug use. SIGNS AND SYMPTOMS  Light vaginal bleeding.  Mild abdominal pain or cramps. DIAGNOSIS  If you have bleeding with or without abdominal pain before 20 weeks of pregnancy, your health care provider will do tests to check whether you are still pregnant. One important test involves using sound waves and a computer (ultrasound) to create images of the inside of your uterus. Other tests include an internal exam of your vagina and uterus (pelvic exam) and measurement of your baby's heart rate.  You may be diagnosed with a threatened miscarriage if:  Ultrasound testing shows you are still pregnant.  Your baby's heart rate is strong.  A pelvic exam shows that the  opening between your uterus and your vagina (cervix) is closed.  Your heart rate and blood pressure are stable.  Blood tests confirm you are still pregnant. TREATMENT  No treatments have been shown to prevent a threatened miscarriage from going on to a complete miscarriage. However, the right home care is important.  HOME CARE INSTRUCTIONS   Make sure you keep all your appointments for prenatal care. This is very important.  Get plenty of rest.  Do not have sex or use tampons if you have vaginal bleeding.  Do not douche.  Do not smoke or use recreational drugs.  Do not drink alcohol.  Avoid caffeine. SEEK MEDICAL CARE IF:  You have light vaginal bleeding or spotting while pregnant.  You have abdominal pain or cramping.  You have a fever. SEEK IMMEDIATE MEDICAL CARE IF:  You have heavy vaginal bleeding.  You have blood clots coming from your vagina.  You have severe low back pain or abdominal cramps.  You have fever, chills, and severe abdominal pain. MAKE SURE YOU:  Understand these instructions.  Will watch your condition.  Will get help right away if you are not doing well or get worse. Document Released: 11/06/2005 Document Revised: 11/11/2013 Document Reviewed: 09/02/2013 [facility] Patient Information 2015 [place], [place]. This information is not intended to replace advice given to you by your health care provider. Make sure you discuss any questions you have with your health care provider.  Sexually Transmitted Disease A sexually transmitted disease (STD) is a disease or infection that may be passed (transmitted) from person to person, usually during sexual activity. This may happen by way of  saliva, semen, blood, vaginal mucus, or urine. Common STDs include:   Gonorrhea.   Chlamydia.   Syphilis.   HIV and AIDS.   Genital herpes.   Hepatitis B and C.   Trichomonas.   Human papillomavirus (HPV).   Pubic lice.    Scabies.  Mites.  Bacterial vaginosis. WHAT ARE CAUSES OF STDs? An STD may be caused by bacteria, a virus, or parasites. STDs are often transmitted during sexual activity if one person is infected. However, they may also be transmitted through nonsexual means. STDs may be transmitted after:   Sexual intercourse with an infected person.   Sharing sex toys with an infected person.   Sharing needles with an infected person or using unclean piercing or tattoo needles.  Having intimate contact with the genitals, mouth, or rectal areas of an infected person.   Exposure to infected fluids during birth. WHAT ARE THE SIGNS AND SYMPTOMS OF STDs? Different STDs have different symptoms. Some people may not have any symptoms. If symptoms are present, they may include:   Painful or bloody urination.   Pain in the pelvis, abdomen, vagina, anus, throat, or eyes.   A skin rash, itching, or irritation.  Growths, ulcerations, blisters, or sores in the genital and anal areas.  Abnormal vaginal discharge with or without bad odor.   Penile discharge in men.   Fever.   Pain or bleeding during sexual intercourse.   Swollen glands in the groin area.   Yellow skin and eyes (jaundice). This is seen with hepatitis.   Swollen testicles.  Infertility.  Sores and blisters in the mouth. HOW ARE STDs DIAGNOSED? To make a diagnosis, your health care provider may:   Take a medical history.   Perform a physical exam.   Take a sample of any discharge to examine.  Swab the throat, cervix, opening to the penis, rectum, or vagina for testing.  Test a sample of your first morning urine.   Perform blood tests.   Perform a Pap test, if this applies.   Perform a colposcopy.   Perform a laparoscopy.  HOW ARE STDs TREATED? Treatment depends on the STD. Some STDs may be treated but not cured.   Chlamydia, gonorrhea, trichomonas, and syphilis can be cured with antibiotic  medicine.   Genital herpes, hepatitis, and HIV can be treated, but not cured, with prescribed medicines. The medicines lessen symptoms.   Genital warts from HPV can be treated with medicine or by freezing, burning (electrocautery), or surgery. Warts may come back.   HPV cannot be cured with medicine or surgery. However, abnormal areas may be removed from the cervix, vagina, or vulva.   If your diagnosis is confirmed, your recent sexual partners need treatment. This is true even if they are symptom-free or have a negative culture or evaluation. They should not have sex until their health care providers say it is okay. HOW CAN I REDUCE MY RISK OF GETTING AN STD? Take these steps to reduce your risk of getting an STD:  Use latex condoms, dental dams, and water-soluble lubricants during sexual activity. Do not use petroleum jelly or oils.  Avoid having multiple sex partners.  Do not have sex with someone who has other sex partners.  Do not have sex with anyone you do not know or who is at high risk for an STD.  Avoid risky sex practices that can break your skin.  Do not have sex if you have open sores on your mouth or skin.  Avoid drinking too much alcohol or taking illegal drugs. Alcohol and drugs can affect your judgment and put you in a vulnerable position.  Avoid engaging in oral and anal sex acts.  Get vaccinated for HPV and hepatitis. If you have not received these vaccines in the past, talk to your health care provider about whether one or both might be right for you.   If you are at risk of being infected with HIV, it is recommended that you take a prescription medicine daily to prevent HIV infection. This is called pre-exposure prophylaxis (PrEP). You are considered at risk if:  You are a man who has sex with other men (MSM).  You are a heterosexual man or woman and are sexually active with more than one partner.  You take drugs by injection.  You are sexually active  with a partner who has HIV.  Talk with your health care provider about whether you are at high risk of being infected with HIV. If you choose to begin PrEP, you should first be tested for HIV. You should then be tested every 3 months for as long as you are taking PrEP.  WHAT SHOULD I DO IF I THINK I HAVE AN STD?  See your health care provider.   Tell your sexual partner(s). They should be tested and treated for any STDs.  Do not have sex until your health care provider says it is okay. WHEN SHOULD I GET IMMEDIATE MEDICAL CARE? Contact your health care provider right away if:   You have severe abdominal pain.  You are a man and notice swelling or pain in your testicles.  You are a woman and notice swelling or pain in your vagina. Document Released: 01/27/2003 Document Revised: 11/11/2013 Document Reviewed: 05/27/2013 [facility] Patient Information 2015 [place], [place]. This information is not intended to replace advice given to you by your health care provider. Make sure you discuss any questions you have with your health care provider. Trichomoniasis Trichomoniasis is an infection caused by an organism called Trichomonas. The infection can affect both women and men. In women, the outer female genitalia and the vagina are affected. In men, the penis is mainly affected, but the prostate and other reproductive organs can also be involved. Trichomoniasis is a sexually transmitted infection (STI) and is most often passed to another person through sexual contact.  RISK FACTORS  Having unprotected sexual intercourse.  Having sexual intercourse with an infected partner. SIGNS AND SYMPTOMS  Symptoms of trichomoniasis in women include:  Abnormal gray-green frothy vaginal discharge.  Itching and irritation of the vagina.  Itching and irritation of the area outside the vagina. Symptoms of trichomoniasis in men include:   Penile discharge with or without pain.  Pain during urination. This  results from inflammation of the urethra. DIAGNOSIS  Trichomoniasis may be found during a Pap test or physical exam. Your health care provider may use one of the following methods to help diagnose this infection:  Examining vaginal discharge under a microscope. For men, urethral discharge would be examined.  Testing the pH of the vagina with a test tape.  Using a vaginal swab test that checks for the Trichomonas organism. A test is available that provides results within a few minutes.  Doing a culture test for the organism. This is not usually needed. TREATMENT   You may be given medicine to fight the infection. Women should inform their health care provider if they could be or are pregnant. Some medicines used to treat the  infection should not be taken during pregnancy.  Your health care provider may recommend over-the-counter medicines or creams to decrease itching or irritation.  Your sexual partner will need to be treated if infected. HOME CARE INSTRUCTIONS   Take medicines only as directed by your health care provider.  Take over-the-counter medicine for itching or irritation as directed by your health care provider.  Do not have sexual intercourse while you have the infection.  Women should not douche or wear tampons while they have the infection.  Discuss your infection with your partner. Your partner may have gotten the infection from you, or you may have gotten it from your partner.  Have your sex partner get examined and treated if necessary.  Practice safe, informed, and protected sex.  See your health care provider for other STI testing. SEEK MEDICAL CARE IF:   You still have symptoms after you finish your medicine.  You develop abdominal pain.  You have pain when you urinate.  You have bleeding after sexual intercourse.  You develop a rash.  Your medicine makes you sick or makes you throw up (vomit). MAKE SURE YOU:  Understand these instructions.  Will  watch your condition.  Will get help right away if you are not doing well or get worse. Document Released: 05/02/2001 Document Revised: 03/23/2014 Document Reviewed: 08/18/2013 [facility] Patient Information 2015 [place], [place]. This information is not intended to replace advice given to you by your health care provider. Make sure you discuss any questions you have with your health care provider.

## 2015-03-05 NOTE — MAU Provider Note (Signed)
History     CSN: [id number]  Arrival date and time: 03/05/15 1145   None     Chief Complaint  Patient presents with  . Vaginal Bleeding   HPI This is a 29 y.o. female who presents with c/o prolonged period this month, lasting 16 days. She states she has always had regular periods and this is the only time this has happened. She states she is not really concerned about this. She is mainly here for retreatment for Trichomonas. States she is pretty sure her partner did not take his antibiotics.   RN Note: Pt presents to MAU with complaints of heavy vaginal bleeding x 16 days. She states that she was treated for trichomonas last month and she thinks she still has it because she is still having lower abdominal pain. Her partner told her he got treated but she is beginning to wonder if he is telling the truth.          OB History    Gravida Para Term Preterm AB TAB SAB Ectopic Multiple Living   2 2 2       2       Past Medical History  Diagnosis Date  . Hypercalcuria   . Glaucoma   . Fibroid uterus     fibroid lymphoma    Past Surgical History  Procedure Laterality Date  . Kidney stents      History reviewed. No pertinent family history.  History  Substance Use Topics  . Smoking status: Current Every Day Smoker -- 0.50 packs/day    Types: Cigarettes  . Smokeless tobacco: Not on file  . Alcohol Use: 2.4 oz/week    4 Shots of liquor per week     Comment: occ    Allergies:  Allergies  Allergen Reactions  . Codeine Hives and Itching    Able to take Dilaudid and Ultram.  . Percocet [Oxycodone-Acetaminophen] Hives and Itching  . Vicodin [Hydrocodone-Acetaminophen] Hives and Itching    Prescriptions prior to admission  Medication Sig Dispense Refill Last Dose  . acetaminophen (TYLENOL) 500 MG tablet Take 1,000 mg by mouth every 6 (six) hours as needed for mild pain.   Past Month at Unknown time  . Cranberry 500 MG CAPS Take 1 capsule by mouth daily.   03/05/2015  at Unknown time  . diphenhydrAMINE (BENADRYL) 25 mg capsule Take 25 mg by mouth every 6 (six) hours as needed for itching or allergies.   Past Month at Unknown time  . ibuprofen (ADVIL,MOTRIN) 200 MG tablet Take 800 mg by mouth every 6 (six) hours as needed for moderate pain or cramping.   03/04/2015 at Unknown time  . Multiple Vitamin (MULTIVITAMIN WITH MINERALS) TABS tablet Take 1 tablet by mouth daily.   03/05/2015 at Unknown time  . tamsulosin (FLOMAX) 0.4 MG CAPS capsule Take 1 capsule (0.4 mg total) by mouth daily. (Patient taking differently: Take 0.4 mg by mouth daily as needed (urinary pain). ) 7 capsule 0 Past Month at Unknown time  . vitamin C (ASCORBIC ACID) 500 MG tablet Take 500 mg by mouth daily.   03/05/2015 at Unknown time  . metroNIDAZOLE (FLAGYL) 500 MG tablet Take 4 tablets (2,000 mg total) by mouth once. (Patient not taking: Reported on 03/05/2015) 4 tablet 0   . sulfamethoxazole-trimethoprim (SEPTRA DS) 800-160 MG per tablet Take 1 tablet by mouth every 12 (twelve) hours. (Patient not taking: Reported on 03/05/2015) 10 tablet 0 01/08/2015 at Unknown time  . traMADol (ULTRAM) 50 MG tablet  Take 1 tablet (50 mg total) by mouth every 6 (six) hours as needed. (Patient not taking: Reported on 03/05/2015) 15 tablet 0     Review of Systems  Constitutional: Negative for fever, chills and malaise/fatigue.  Gastrointestinal: Negative for nausea, vomiting, abdominal pain, diarrhea and constipation.  Genitourinary:       Vaginal bleeding Itching   Neurological: Negative for dizziness.   Physical Exam   Blood pressure 123/83, pulse 99, temperature 98.1 F (36.7 C), resp. rate 18, height 5' 2"  (1.575 m), weight 157 lb (71.215 kg), last menstrual period 03/05/2015.  Physical Exam  Constitutional: She is oriented to person, place, and time. She appears well-developed and well-nourished. No distress.  HENT:  Head: Normocephalic.  Cardiovascular: Normal rate and regular rhythm.  Exam reveals  no gallop and no friction rub.   No murmur heard. Respiratory: Effort normal. No respiratory distress. She has no wheezes. She has no rales. She exhibits no tenderness.  GI: Soft.  Musculoskeletal: Normal range of motion.  Neurological: She is alert and oriented to person, place, and time.  Skin: Skin is warm and dry.  Psychiatric: She has a normal mood and affect.    MAU Course  Procedures  MDM Results for orders placed or performed during the hospital encounter of 03/05/15 (from the past 24 hour(s))  Urinalysis, Routine w reflex microscopic     Status: Abnormal   Collection Time: 03/05/15 11:50 AM  Result Value Ref Range   Color, Urine YELLOW YELLOW   APPearance HAZY (A) CLEAR   Specific Gravity, Urine >1.030 (H) 1.005 - 1.030   pH 6.0 5.0 - 8.0   Glucose, UA NEGATIVE NEGATIVE mg/dL   Hgb urine dipstick LARGE (A) NEGATIVE   Bilirubin Urine NEGATIVE NEGATIVE   Ketones, ur NEGATIVE NEGATIVE mg/dL   Protein, ur NEGATIVE NEGATIVE mg/dL   Urobilinogen, UA 0.2 0.0 - 1.0 mg/dL   Nitrite NEGATIVE NEGATIVE   Leukocytes, UA NEGATIVE NEGATIVE  Urine microscopic-add on     Status: Abnormal   Collection Time: 03/05/15 11:50 AM  Result Value Ref Range   Squamous Epithelial / LPF FEW (A) RARE   WBC, UA 0-2 <3 WBC/hpf   RBC / HPF TOO NUMEROUS TO COUNT <3 RBC/hpf   Urine-Other TRICHOMONAS PRESENT   Pregnancy, urine POC     Status: Abnormal   Collection Time: 03/05/15 12:37 PM  Result Value Ref Range   Preg Test, Ur POSITIVE (A) NEGATIVE  Wet prep, genital     Status: Abnormal   Collection Time: 03/05/15 12:40 PM  Result Value Ref Range   Yeast Wet Prep HPF POC NONE SEEN NONE SEEN   Trich, Wet Prep FEW (A) NONE SEEN   Clue Cells Wet Prep HPF POC NONE SEEN NONE SEEN   WBC, Wet Prep HPF POC FEW (A) NONE SEEN  CBC     Status: None   Collection Time: 03/05/15  1:10 PM  Result Value Ref Range   WBC 8.3 4.0 - 10.5 K/uL   RBC 4.42 3.87 - 5.11 MIL/uL   Hemoglobin 13.4 12.0 - 15.0 g/dL    HCT 40.6 36.0 - 46.0 %   MCV 91.9 78.0 - 100.0 fL   MCH 30.3 26.0 - 34.0 pg   MCHC 33.0 30.0 - 36.0 g/dL   RDW 13.2 11.5 - 15.5 %   Platelets 291 150 - 400 K/uL  hCG, quantitative, pregnancy     Status: Abnormal   Collection Time: 03/05/15  1:10 PM  Result Value  Ref Range   hCG, Beta Chain, Quant, S 60 (H) <5 mIU/mL   Patient was shocked about her pregnancy test. Discussed need for workup. Patient cannot stay.  HCG added to labwork. Will have her come back in 48 hours for repeat. Discussed with prolonged heavy bleeding, this may represent a miscarriage. Cannot rule out ectopic, though she has no pain.   Assessment and Plan  A:  Positive pregnancy test      Prolonged vaginal bleeding      Reinfection of Trichomonas       Threatened abortion vs ectopic  P:  Discharge home       Reinforced need to return for quant hcg in 48 hrs       Ectopic precautions       Reviewed normal hemoglobin       Treated for Trich with Flagyl       Paper Rx given for partner   [facility] 03/05/2015, 12:19 PM

## 2015-03-05 NOTE — MAU Note (Signed)
Pt presents to MAU with complaints of heavy vaginal bleeding x 16 days. She states that she was treated for trichomonas last month and she thinks she still has it because she is still having lower abdominal pain. Her partner told her he got treated but she is beginning to wonder if he is telling the truth.

## 2015-03-07 ENCOUNTER — Inpatient Hospital Stay (HOSPITAL_COMMUNITY)
Admission: AD | Admit: 2015-03-07 | Discharge: 2015-03-07 | Disposition: A | Discharge status: Home / Self Care | Payer: Self-pay | Source: Ambulatory Visit | Attending: Obstetrics and Gynecology | Admitting: Obstetrics and Gynecology

## 2015-03-07 DIAGNOSIS — O039 Complete or unspecified spontaneous abortion without complication: Secondary | ICD-10-CM | POA: Insufficient documentation

## 2015-03-07 DIAGNOSIS — Z3A00 Weeks of gestation of pregnancy not specified: Secondary | ICD-10-CM | POA: Insufficient documentation

## 2015-03-07 LAB — HCG, QUANTITATIVE, PREGNANCY: HCG, BETA CHAIN, QUANT, S: 28 m[IU]/mL — AB (ref <5)

## 2015-03-07 NOTE — MAU Provider Note (Signed)
S: 29 y.o. [id number] presents to MAU for repeat hcg.  She denies abdominal pain today but continues to have menstrual-like bleeding, off and on x 18 days.   Her quant hcg on 03/05/15 was 60.  She did not have ultrasound but was having menstrual-like bleeding x 18 days and no pain, so likely SAB but ectopic not excluded.  Plan for return for hcg in 48 hours.  O: BP 120/77 mmHg  Pulse 108  LMP 03/05/2015  Physical Examination: General appearance - alert, well appearing, and in no distress, oriented to person, place, and time and acyanotic, in no respiratory distress  Results for orders placed or performed during the hospital encounter of 03/07/15 (from the past 24 hour(s))  hCG, quantitative, pregnancy     Status: Abnormal   Collection Time: 03/07/15  3:38 PM  Result Value Ref Range   hCG, Beta Chain, Quant, S 28 (H) <5 mIU/mL    A: 1. Spontaneous miscarriage     P: D/C home with bleeding precautions F/U in [place] in 1 week for repeat hcg Return to MAU as needed for emergencies  LEFTWICH-KIRBY, Tabor Denham, CNM 2:18 PM

## 2015-03-07 NOTE — MAU Note (Signed)
Pt presents to MAU for repeat BHCG. Reports vaginal bleeding with lower abdominal cramping.

## 2015-03-07 NOTE — Discharge Instructions (Signed)
Miscarriage A miscarriage is the sudden loss of an unborn baby (fetus) before the 20th week of pregnancy. Most miscarriages happen in the first 3 months of pregnancy. Sometimes, it happens before a woman even knows she is pregnant. A miscarriage is also called a "spontaneous miscarriage" or "early pregnancy loss." Having a miscarriage can be an emotional experience. Talk with your caregiver about any questions you may have about miscarrying, the grieving process, and your future pregnancy plans. CAUSES   Problems with the fetal chromosomes that make it impossible for the baby to develop normally. Problems with the baby's genes or chromosomes are most often the result of errors that occur, by chance, as the embryo divides and grows. The problems are not inherited from the parents.  Infection of the cervix or uterus.   Hormone problems.   Problems with the cervix, such as having an incompetent cervix. This is when the tissue in the cervix is not strong enough to hold the pregnancy.   Problems with the uterus, such as an abnormally shaped uterus, uterine fibroids, or congenital abnormalities.   Certain medical conditions.   Smoking, drinking alcohol, or taking illegal drugs.   Trauma.  Often, the cause of a miscarriage is unknown.  SYMPTOMS   Vaginal bleeding or spotting, with or without cramps or pain.  Pain or cramping in the abdomen or lower back.  Passing fluid, tissue, or blood clots from the vagina. DIAGNOSIS  Your caregiver will perform a physical exam. You may also have an ultrasound to confirm the miscarriage. Blood or urine tests may also be ordered. TREATMENT   Sometimes, treatment is not necessary if you naturally pass all the fetal tissue that was in the uterus. If some of the fetus or placenta remains in the body (incomplete miscarriage), tissue left behind may become infected and must be removed. Usually, a dilation and curettage (D and C) procedure is performed.  During a D and C procedure, the cervix is widened (dilated) and any remaining fetal or placental tissue is gently removed from the uterus.  Antibiotic medicines are prescribed if there is an infection. Other medicines may be given to reduce the size of the uterus (contract) if there is a lot of bleeding.  If you have Rh negative blood and your baby was Rh positive, you will need a Rh immunoglobulin shot. This shot will protect any future baby from having Rh blood problems in future pregnancies. HOME CARE INSTRUCTIONS   Your caregiver may order bed rest or may allow you to continue light activity. Resume activity as directed by your caregiver.  Have someone help with home and family responsibilities during this time.   Keep track of the number of sanitary pads you use each day and how soaked (saturated) they are. Write down this information.   Do not use tampons. Do not douche or have sexual intercourse until approved by your caregiver.   Only take over-the-counter or prescription medicines for pain or discomfort as directed by your caregiver.   Do not take aspirin. Aspirin can cause bleeding.   Keep all follow-up appointments with your caregiver.   If you or your partner have problems with grieving, talk to your caregiver or seek counseling to help cope with the pregnancy loss. Allow enough time to grieve before trying to get pregnant again.  SEEK IMMEDIATE MEDICAL CARE IF:   You have severe cramps or pain in your back or abdomen.  You have a fever.  You pass large blood clots (walnut-sized   or larger) ortissue from your vagina. Save any tissue for your caregiver to inspect.   Your bleeding increases.   You have a thick, bad-smelling vaginal discharge.  You become lightheaded, weak, or you faint.   You have chills.  MAKE SURE YOU:  Understand these instructions.  Will watch your condition.  Will get help right away if you are not doing well or get  worse. Document Released: 05/02/2001 Document Revised: 03/03/2013 Document Reviewed: 12/26/2011 Exit[employer] Patient Information 2015 [employer]. This information is not intended to replace advice given to you by your health care provider. Make sure you discuss any questions you have with your health care provider.  

## 2015-03-15 ENCOUNTER — Telehealth: Payer: Self-pay | Admitting: Obstetrics & Gynecology

## 2015-03-15 ENCOUNTER — Telehealth: Payer: Self-pay | Admitting: *Deleted

## 2015-03-15 ENCOUNTER — Other Ambulatory Visit: Payer: Self-pay

## 2015-03-15 DIAGNOSIS — O039 Complete or unspecified spontaneous abortion without complication: Secondary | ICD-10-CM

## 2015-03-15 NOTE — Telephone Encounter (Signed)
Attempted to contact patient concerning missed appointment for BHCG (stat) on 03/15/15.  Left message for patient to call the clinic.

## 2015-03-15 NOTE — Telephone Encounter (Signed)
Called patient left message for her to call back clinic, due to missing stat lab.

## 2015-03-16 ENCOUNTER — Other Ambulatory Visit: Payer: Self-pay

## 2015-03-16 ENCOUNTER — Telehealth: Payer: Self-pay | Admitting: General Practice

## 2015-03-16 DIAGNOSIS — O039 Complete or unspecified spontaneous abortion without complication: Secondary | ICD-10-CM

## 2015-03-16 LAB — HCG, QUANTITATIVE, PREGNANCY: HCG, BETA CHAIN, QUANT, S: 1 m[IU]/mL

## 2015-03-16 NOTE — Progress Notes (Unsigned)
Jeanne Mcneil here for bhcg. Denies any pain or bleeding. States bleeding stopped yesterday. Notified if severe pain, heavy bleeding go to MAU

## 2015-03-16 NOTE — Telephone Encounter (Signed)
Called Jeanne Mcneil and notified her she missed an appointment for a missed bhcg yesterday. She agreed to come today at 11:00 for bhcg. I explained we would call her with results and plan, she may need another blood draw next week. She vocied understanding.

## 2015-03-16 NOTE — Telephone Encounter (Signed)
Dr Elly Modena reviewed bhcg, no further follow up needed. Called patient and informed her of results and asked patient if she was wanting to get pregnant again soon. Patient states no. Asked patient if she would like an appt to come in birth control. Patient states that would be fine but she has a lot going on right now and doesn't know if she will need this fibroid removed or not. Told patient I will have the front office call her with an appt and she can discuss that at her visit. Patient verbalized understanding and had no other questions

## 2015-03-17 ENCOUNTER — Encounter: Payer: Self-pay | Admitting: Obstetrics & Gynecology

## 2015-04-14 ENCOUNTER — Ambulatory Visit: Payer: Self-pay | Admitting: Obstetrics & Gynecology

## 2015-05-10 ENCOUNTER — Inpatient Hospital Stay (HOSPITAL_COMMUNITY)
Admission: AD | Admit: 2015-05-10 | Discharge: 2015-05-10 | Disposition: A | Discharge status: Home / Self Care | Payer: Self-pay | Source: Ambulatory Visit | Attending: Obstetrics and Gynecology | Admitting: Obstetrics and Gynecology

## 2015-05-10 ENCOUNTER — Inpatient Hospital Stay (HOSPITAL_COMMUNITY): Payer: Self-pay

## 2015-05-10 ENCOUNTER — Encounter (HOSPITAL_COMMUNITY): Payer: Self-pay | Admitting: *Deleted

## 2015-05-10 DIAGNOSIS — N939 Abnormal uterine and vaginal bleeding, unspecified: Secondary | ICD-10-CM | POA: Insufficient documentation

## 2015-05-10 DIAGNOSIS — N920 Excessive and frequent menstruation with regular cycle: Secondary | ICD-10-CM

## 2015-05-10 DIAGNOSIS — F17210 Nicotine dependence, cigarettes, uncomplicated: Secondary | ICD-10-CM | POA: Insufficient documentation

## 2015-05-10 DIAGNOSIS — A084 Viral intestinal infection, unspecified: Secondary | ICD-10-CM | POA: Insufficient documentation

## 2015-05-10 DIAGNOSIS — F419 Anxiety disorder, unspecified: Secondary | ICD-10-CM | POA: Insufficient documentation

## 2015-05-10 DIAGNOSIS — R1031 Right lower quadrant pain: Secondary | ICD-10-CM | POA: Insufficient documentation

## 2015-05-10 DIAGNOSIS — K219 Gastro-esophageal reflux disease without esophagitis: Secondary | ICD-10-CM

## 2015-05-10 LAB — CBC
HCT: 38.4 % (ref 36.0–46.0)
HEMOGLOBIN: 12.9 g/dL (ref 12.0–15.0)
MCH: 30.4 pg (ref 26.0–34.0)
MCHC: 33.6 g/dL (ref 30.0–36.0)
MCV: 90.6 fL (ref 78.0–100.0)
Platelets: 280 10*3/uL (ref 150–400)
RBC: 4.24 MIL/uL (ref 3.87–5.11)
RDW: 13.4 % (ref 11.5–15.5)
WBC: 12.6 10*3/uL — AB (ref 4.0–10.5)

## 2015-05-10 LAB — LIPASE, BLOOD: LIPASE: 19 U/L — AB (ref 22–51)

## 2015-05-10 LAB — WET PREP, GENITAL
TRICH WET PREP: NONE SEEN
Yeast Wet Prep HPF POC: NONE SEEN

## 2015-05-10 LAB — COMPREHENSIVE METABOLIC PANEL
ALBUMIN: 3.9 g/dL (ref 3.5–5.0)
ALK PHOS: 56 U/L (ref 38–126)
ALT: 20 U/L (ref 14–54)
ANION GAP: 7 (ref 5–15)
AST: 20 U/L (ref 15–41)
BILIRUBIN TOTAL: 0.4 mg/dL (ref 0.3–1.2)
BUN: 12 mg/dL (ref 6–20)
CO2: 24 mmol/L (ref 22–32)
CREATININE: 0.74 mg/dL (ref 0.44–1.00)
Calcium: 9 mg/dL (ref 8.9–10.3)
Chloride: 106 mmol/L (ref 101–111)
GLUCOSE: 120 mg/dL — AB (ref 65–99)
POTASSIUM: 3.6 mmol/L (ref 3.5–5.1)
Sodium: 137 mmol/L (ref 135–145)
Total Protein: 6.7 g/dL (ref 6.5–8.1)

## 2015-05-10 LAB — HCG, QUANTITATIVE, PREGNANCY

## 2015-05-10 LAB — URINALYSIS, ROUTINE W REFLEX MICROSCOPIC
BILIRUBIN URINE: NEGATIVE
Glucose, UA: NEGATIVE mg/dL
Ketones, ur: NEGATIVE mg/dL
Leukocytes, UA: NEGATIVE
Nitrite: NEGATIVE
Protein, ur: 100 mg/dL — AB
Specific Gravity, Urine: >1.03 — ABNORMAL HIGH (ref 1.005–1.030)
UROBILINOGEN UA: 0.2 mg/dL (ref 0.0–1.0)
pH: 6 (ref 5.0–8.0)

## 2015-05-10 LAB — URINE MICROSCOPIC-ADD ON

## 2015-05-10 LAB — POCT PREGNANCY, URINE: Preg Test, Ur: NEGATIVE

## 2015-05-10 LAB — AMYLASE: Amylase: 49 U/L (ref 28–100)

## 2015-05-10 LAB — ETHANOL: Alcohol, Ethyl (B): <5 mg/dL (ref <5)

## 2015-05-10 MED ORDER — GI COCKTAIL ~~LOC~~
30.0000 mL | Freq: Once | ORAL | Status: AC
  Administered 2015-05-10: 30 mL via ORAL
  Filled 2015-05-10: qty 30

## 2015-05-10 MED ORDER — ONDANSETRON HCL 4 MG PO TABS: 4.0000 mg | ORAL_TABLET | Freq: Four times a day (QID) | ORAL | Status: DC

## 2015-05-10 MED ORDER — PROMETHAZINE HCL 25 MG PO TABS: 25.0000 mg | ORAL_TABLET | Freq: Four times a day (QID) | ORAL | Status: DC | PRN

## 2015-05-10 MED ORDER — NORGESTIMATE-ETH ESTRADIOL 0.25-35 MG-MCG PO TABS: 1.0000 | ORAL_TABLET | Freq: Every day | ORAL | Status: DC

## 2015-05-10 MED ORDER — LACTATED RINGERS IV BOLUS (SEPSIS)
1000.0000 mL | Freq: Once | INTRAVENOUS | Status: AC
  Administered 2015-05-10: 1000 mL via INTRAVENOUS

## 2015-05-10 MED ORDER — ONDANSETRON 8 MG PO TBDP
8.0000 mg | ORAL_TABLET | Freq: Once | ORAL | Status: AC
  Administered 2015-05-10: 8 mg via ORAL
  Filled 2015-05-10: qty 1

## 2015-05-10 NOTE — MAU Provider Note (Signed)
History     CSN: [id number]  Arrival date and time: 05/10/15 0720   None     Chief Complaint  Patient presents with  . Nausea  . Diarrhea   HPI Pt is G3P2SAB 1 with SAB 2 mos ago.  Pt has had HCG followed to <1.  Pt has been using condoms since that time for contraception. Pt c/o of abdominal pain, N/V and diarrhea.  Pt also states that she is bleeding heavily with 2 periods this month- sha has been changing a pad every 2 hours. Pt c/o of mid abdominal pain. Pt states that she has had blood tinged vomitus. She thinks she may have an ulcer. Pt admits to drinking Vodka 2 days ago and also using marijuana. RN note: N/V/D for One month, had miscarriage prior to. Had period two times in one month. Abd pain in mid abd.  Past Medical History  Diagnosis Date  . Hypercalcuria   . Glaucoma   . Fibroid uterus     fibroid lymphoma    Past Surgical History  Procedure Laterality Date  . Kidney stents      History reviewed. No pertinent family history.  History  Substance Use Topics  . Smoking status: Current Every Day Smoker -- 0.50 packs/day    Types: Cigarettes  . Smokeless tobacco: Not on file  . Alcohol Use: 2.4 oz/week    4 Shots of liquor per week     Comment: occ    Allergies:  Allergies  Allergen Reactions  . Codeine Hives and Itching    Able to take Dilaudid and Ultram.  . Percocet [Oxycodone-Acetaminophen] Hives and Itching  . Vicodin [Hydrocodone-Acetaminophen] Hives and Itching    Prescriptions prior to admission  Medication Sig Dispense Refill Last Dose  . acetaminophen (TYLENOL) 500 MG tablet Take 1,000 mg by mouth every 6 (six) hours as needed for mild pain.   Past Month at Unknown time  . Cranberry 500 MG CAPS Take 1 capsule by mouth daily.   03/05/2015 at Unknown time  . diphenhydrAMINE (BENADRYL) 25 mg capsule Take 25 mg by mouth every 6 (six) hours as needed for itching or allergies.   Past Month at Unknown time  . ibuprofen (ADVIL,MOTRIN) 200 MG  tablet Take 800 mg by mouth every 6 (six) hours as needed for moderate pain or cramping.   03/04/2015 at Unknown time  . Multiple Vitamin (MULTIVITAMIN WITH MINERALS) TABS tablet Take 1 tablet by mouth daily.   03/05/2015 at Unknown time  . tamsulosin (FLOMAX) 0.4 MG CAPS capsule Take 1 capsule (0.4 mg total) by mouth daily. (Patient taking differently: Take 0.4 mg by mouth daily as needed (urinary pain). ) 7 capsule 0 Past Month at Unknown time  . vitamin C (ASCORBIC ACID) 500 MG tablet Take 500 mg by mouth daily.   03/05/2015 at Unknown time    Review of Systems  Constitutional: Negative for fever and chills.  Gastrointestinal: Positive for nausea, vomiting, abdominal pain and diarrhea.  Genitourinary: Negative for dysuria and urgency.   Physical Exam   Blood pressure 156/96, pulse 61, temperature 97.5 F (36.4 C), resp. rate 18, height 5' 2"  (1.575 m), weight 154 lb (69.854 kg), last menstrual period 05/03/2015.  Physical Exam  Nursing note and vitals reviewed. Constitutional: She is oriented to person, place, and time. She appears well-developed and well-nourished. No distress.  HENT:  Head: Normocephalic.  Eyes: Pupils are equal, round, and reactive to light.  Neck: Normal range of motion. Neck supple.  Cardiovascular: Normal rate.   Respiratory: Effort normal.  GI: Soft. She exhibits no distension. There is tenderness. There is no rebound and no guarding.  Genitourinary:  Small amount of bright red blood in vagina Cervix clean, NT; uterus and adnexa without palpable enlargement or tenderness  Musculoskeletal: Normal range of motion.  Neurological: She is alert and oriented to person, place, and time.  Skin: Skin is warm and dry.  Psychiatric: She has a normal mood and affect.    MAU Course  Procedures .Zofran 42m ODT IV LR bolus Results for orders placed or performed during the hospital encounter of 05/10/15 (from the past 24 hour(s))  CBC     Status: Abnormal   Collection  Time: 05/10/15  8:25 AM  Result Value Ref Range   WBC 12.6 (H) 4.0 - 10.5 K/uL   RBC 4.24 3.87 - 5.11 MIL/uL   Hemoglobin 12.9 12.0 - 15.0 g/dL   HCT 38.4 36.0 - 46.0 %   MCV 90.6 78.0 - 100.0 fL   MCH 30.4 26.0 - 34.0 pg   MCHC 33.6 30.0 - 36.0 g/dL   RDW 13.4 11.5 - 15.5 %   Platelets 280 150 - 400 K/uL  Comprehensive metabolic panel     Status: Abnormal   Collection Time: 05/10/15  8:25 AM  Result Value Ref Range   Sodium 137 135 - 145 mmol/L   Potassium 3.6 3.5 - 5.1 mmol/L   Chloride 106 101 - 111 mmol/L   CO2 24 22 - 32 mmol/L   Glucose, Bld 120 (H) 65 - 99 mg/dL   BUN 12 6 - 20 mg/dL   Creatinine, Ser 0.74 0.44 - 1.00 mg/dL   Calcium 9.0 8.9 - 10.3 mg/dL   Total Protein 6.7 6.5 - 8.1 g/dL   Albumin 3.9 3.5 - 5.0 g/dL   AST 20 15 - 41 U/L   ALT 20 14 - 54 U/L   Alkaline Phosphatase 56 38 - 126 U/L   Total Bilirubin 0.4 0.3 - 1.2 mg/dL   GFR calc non Af Amer >60 >60 mL/min   GFR calc Af Amer >60 >60 mL/min   Anion gap 7 5 - 15  Lipase, blood     Status: Abnormal   Collection Time: 05/10/15  8:25 AM  Result Value Ref Range   Lipase 19 (L) 22 - 51 U/L  Amylase     Status: None   Collection Time: 05/10/15  8:25 AM  Result Value Ref Range   Amylase 49 28 - 100 U/L  Ethanol     Status: None   Collection Time: 05/10/15  8:25 AM  Result Value Ref Range   Alcohol, Ethyl (B) <5 <5 mg/dL  hCG, quantitative, pregnancy     Status: None   Collection Time: 05/10/15  8:25 AM  Result Value Ref Range   hCG, Beta Chain, Quant, S <1 <5 mIU/mL  Urinalysis, [facility] w reflex microscopic [facility]     Status: Abnormal   Collection Time: 05/10/15  9:50 AM  Result Value Ref Range   Color, Urine YELLOW YELLOW   APPearance CLEAR CLEAR   Specific Gravity, Urine >1.030 (H) 1.005 - 1.030   pH 6.0 5.0 - 8.0   Glucose, UA NEGATIVE NEGATIVE mg/dL   Hgb urine dipstick LARGE (A) NEGATIVE   Bilirubin Urine NEGATIVE NEGATIVE   Ketones, ur NEGATIVE NEGATIVE mg/dL   Protein, ur 100 (A)  NEGATIVE mg/dL   Urobilinogen, UA 0.2 0.0 - 1.0 mg/dL  Nitrite NEGATIVE NEGATIVE   Leukocytes, UA NEGATIVE NEGATIVE  Wet prep, genital     Status: Abnormal   Collection Time: 05/10/15  9:50 AM  Result Value Ref Range   Yeast Wet Prep HPF POC NONE SEEN NONE SEEN   Trich, Wet Prep NONE SEEN NONE SEEN   Clue Cells Wet Prep HPF POC FEW (A) NONE SEEN   WBC, Wet Prep HPF POC FEW (A) NONE SEEN  Urine microscopic-add on     Status: Abnormal   Collection Time: 05/10/15  9:50 AM  Result Value Ref Range   Squamous Epithelial / LPF RARE RARE   WBC, UA 0-2 <3 WBC/hpf   Bacteria, UA FEW (A) RARE  Pregnancy, urine POC     Status: None   Collection Time: 05/10/15 10:02 AM  Result Value Ref Range   Preg Test, Ur NEGATIVE NEGATIVE  Zofran 73m ODT given LR 1 liter bolus GC/chlamdyia pending No dry heaving, or vomiting or diarrhea while in MAU [place]oreaTransvaginal Non-ob  05/10/2015   CLINICAL DATA:  Abnormal uterine bleeding for 1 month. Right lower quadrant pain. Fibroids. LMP 05/03/2015  EXAM: TRANSABDOMINAL AND TRANSVAGINAL ULTRASOUND OF PELVIS  TECHNIQUE: Both transabdominal and transvaginal ultrasound examinations of the pelvis were performed. Transabdominal technique was performed for global imaging of the pelvis including uterus, ovaries, adnexal regions, and pelvic cul-de-sac. It was necessary to proceed with endovaginal exam following the transabdominal exam to visualize the endometrial stripe and ovaries.  COMPARISON:  01/09/2015  FINDINGS: Uterus  Measurements: 8.3 x 2.9 x 5.1 cm. Retroverted. An exophytic subserosal fibroid is again seen in the left fundal region which measures 2.3 x 3.3 x 2.4 cm. This is not significantly changed. No other fibroids identified.  Endometrium  Thickness: 8 mm.  No focal abnormality visualized.  Right ovary  Measurements: 2.4 x 1.4 x 1.9 cm. Normal appearance/no adnexal mass.  Left ovary  Measurements: 2.8 x 1.5 x 2.7 cm. Normal appearance/no adnexal mass.  Other  findings  No free fluid.  IMPRESSION: Retroverted uterus with 3 cm subserosal fibroid in the left fundal region, without significant change.  Endometrial thickness measures 8 mm. If bleeding remains unresponsive to hormonal or medical therapy, sonohysterogram should be considered for focal lesion work-up. (Ref: Radiological Reasoning: Algorithmic Workup of Abnormal Vaginal Bleeding with Endovaginal Sonography and Sonohysterography. AJR 2008;; 330:Q76-22.  Normal appearance of both ovaries.   Electronically Signed   By: JEarle GellM.D.   On: 05/10/2015 12:31   [place]Pelvis Complete  05/10/2015   CLINICAL DATA:  Abnormal uterine bleeding for 1 month. Right lower quadrant pain. Fibroids. LMP 05/03/2015  EXAM: TRANSABDOMINAL AND TRANSVAGINAL ULTRASOUND OF PELVIS  TECHNIQUE: Both transabdominal and transvaginal ultrasound examinations of the pelvis were performed. Transabdominal technique was performed for global imaging of the pelvis including uterus, ovaries, adnexal regions, and pelvic cul-de-sac. It was necessary to proceed with endovaginal exam following the transabdominal exam to visualize the endometrial stripe and ovaries.  COMPARISON:  01/09/2015  FINDINGS: Uterus  Measurements: 8.3 x 2.9 x 5.1 cm. Retroverted. An exophytic subserosal fibroid is again seen in the left fundal region which measures 2.3 x 3.3 x 2.4 cm. This is not significantly changed. No other fibroids identified.  Endometrium  Thickness: 8 mm.  No focal abnormality visualized.  Right ovary  Measurements: 2.4 x 1.4 x 1.9 cm. Normal appearance/no adnexal mass.  Left ovary  Measurements: 2.8 x 1.5 x 2.7 cm. Normal appearance/no adnexal mass.  Other findings  No free fluid.  IMPRESSION: Retroverted uterus with 3 cm subserosal fibroid in the left fundal region, without significant change.  Endometrial thickness measures 8 mm. If bleeding remains unresponsive to hormonal or medical therapy, sonohysterogram should be considered for focal lesion  work-up. (Ref: Radiological Reasoning: Algorithmic Workup of Abnormal Vaginal Bleeding with Endovaginal Sonography and Sonohysterography. AJR 2008; 189:Q42-10).  Normal appearance of both ovaries.   Electronically Signed   By: Earle Gell M.D.   On: 05/10/2015 12:31   Assessment and Plan  Viral gastroenteritis- phenergan, zofran RX AUB- orthrotricycline 2 tabs until bleeding stops then one daily Continue with therapy for anxiety Smoking cessation discussed   Marwah Disbro 05/10/2015, 8:04 AM

## 2015-05-10 NOTE — MAU Note (Signed)
N/V/D for  One month, had miscarriage prior to.  Had period two times in one month. Abd pain in mid abd.

## 2015-05-11 LAB — HIV ANTIBODY (ROUTINE TESTING W REFLEX): HIV SCREEN 4TH GENERATION: NONREACTIVE

## 2015-05-11 LAB — GC/CHLAMYDIA PROBE AMP (~~LOC~~) NOT AT ARMC
CHLAMYDIA, DNA PROBE: NEGATIVE
Neisseria Gonorrhea: NEGATIVE

## 2015-05-12 LAB — CULTURE, OB URINE

## 2015-07-02 ENCOUNTER — Emergency Department (HOSPITAL_COMMUNITY)
Admission: EM | Admit: 2015-07-02 | Discharge: 2015-07-02 | Disposition: A | Discharge status: Home / Self Care | Payer: Self-pay | Attending: Emergency Medicine | Admitting: Emergency Medicine

## 2015-07-02 ENCOUNTER — Encounter (HOSPITAL_COMMUNITY): Payer: Self-pay | Admitting: Emergency Medicine

## 2015-07-02 DIAGNOSIS — N390 Urinary tract infection, site not specified: Secondary | ICD-10-CM | POA: Insufficient documentation

## 2015-07-02 DIAGNOSIS — Z86018 Personal history of other benign neoplasm: Secondary | ICD-10-CM | POA: Insufficient documentation

## 2015-07-02 DIAGNOSIS — Z8639 Personal history of other endocrine, nutritional and metabolic disease: Secondary | ICD-10-CM | POA: Insufficient documentation

## 2015-07-02 DIAGNOSIS — Z720 Tobacco use: Secondary | ICD-10-CM | POA: Insufficient documentation

## 2015-07-02 DIAGNOSIS — Z79899 Other long term (current) drug therapy: Secondary | ICD-10-CM | POA: Insufficient documentation

## 2015-07-02 DIAGNOSIS — Z8669 Personal history of other diseases of the nervous system and sense organs: Secondary | ICD-10-CM | POA: Insufficient documentation

## 2015-07-02 DIAGNOSIS — A599 Trichomoniasis, unspecified: Secondary | ICD-10-CM

## 2015-07-02 DIAGNOSIS — A5901 Trichomonal vulvovaginitis: Secondary | ICD-10-CM | POA: Insufficient documentation

## 2015-07-02 LAB — URINALYSIS, ROUTINE W REFLEX MICROSCOPIC
Bilirubin Urine: NEGATIVE
Glucose, UA: NEGATIVE mg/dL
Ketones, ur: NEGATIVE mg/dL
Nitrite: NEGATIVE
Protein, ur: NEGATIVE mg/dL
Specific Gravity, Urine: 1.034 — ABNORMAL HIGH (ref 1.005–1.030)
Urobilinogen, UA: 0.2 mg/dL (ref 0.0–1.0)
pH: 6 (ref 5.0–8.0)

## 2015-07-02 LAB — WET PREP, GENITAL
Clue Cells Wet Prep HPF POC: NONE SEEN
Yeast Wet Prep HPF POC: NONE SEEN

## 2015-07-02 LAB — URINE MICROSCOPIC-ADD ON

## 2015-07-02 LAB — I-STAT BETA HCG BLOOD, ED (MC, WL, AP ONLY): I-stat hCG, quantitative: <5 m[IU]/mL

## 2015-07-02 MED ORDER — CEFTRIAXONE SODIUM 250 MG IJ SOLR
250.0000 mg | Freq: Once | INTRAMUSCULAR | Status: AC
  Administered 2015-07-02: 250 mg via INTRAMUSCULAR
  Filled 2015-07-02: qty 250

## 2015-07-02 MED ORDER — AZITHROMYCIN 250 MG PO TABS
1000.0000 mg | ORAL_TABLET | Freq: Once | ORAL | Status: AC
  Administered 2015-07-02: 1000 mg via ORAL
  Filled 2015-07-02: qty 4

## 2015-07-02 MED ORDER — DOXYCYCLINE HYCLATE 100 MG PO CAPS: 100.0000 mg | ORAL_CAPSULE | Freq: Two times a day (BID) | ORAL | Status: DC

## 2015-07-02 MED ORDER — METRONIDAZOLE 500 MG PO TABS
2000.0000 mg | ORAL_TABLET | Freq: Once | ORAL | Status: AC
  Administered 2015-07-02: 2000 mg via ORAL
  Filled 2015-07-02: qty 4

## 2015-07-02 MED ORDER — STERILE WATER FOR INJECTION IJ SOLN
INTRAMUSCULAR | Status: AC
  Administered 2015-07-02: 10 mL
  Filled 2015-07-02: qty 10

## 2015-07-02 NOTE — ED Notes (Signed)
Pt complaint of vaginal itching/pain onset 3 days ago; request STD check.

## 2015-07-02 NOTE — ED Provider Notes (Signed)
CSN: [id number]     Arrival date & time 07/02/15  1458 History   First MD Initiated Contact with Patient 07/02/15 1739     Chief Complaint  Patient presents with  . Exposure to STD  . Vaginal Itching     (Consider location/radiation/quality/duration/timing/severity/associated sxs/prior Treatment) HPI   29 year old female who presents concerning for STD exposure.  Pt report that her boyfriend recently told her he has trichomonas infection.  Pt report her BF admits he has cheated on her with another woman.  Her last sexual activity was 3 weeks ago.  She report having vaginal bleeding for the past 16 days, with 2-3 pads daily.  Does admits to having irregular menstruation.  She is a Radio broadcast assistant.  She report having mild low abd cramping for the past 3 days with urinary discomfort, and vaginal itching without discharge.  She is concern for STD.  Otherwise no fever, chills, cp, sob, back pain, or rash.    Past Medical History  Diagnosis Date  . Hypercalcuria   . Glaucoma   . Fibroid uterus     fibroid lymphoma   Past Surgical History  Procedure Laterality Date  . Kidney stents     No family history on file. Social History  Substance Use Topics  . Smoking status: Current Every Day Smoker -- 0.50 packs/day    Types: Cigarettes  . Smokeless tobacco: None  . Alcohol Use: 2.4 oz/week    4 Shots of liquor per week     Comment: occ   OB History    Gravida Para Term Preterm AB TAB SAB Ectopic Multiple Living   3 2 2  1  1   2      Review of Systems  All other systems reviewed and are negative.     Allergies  Codeine; Percocet; and Vicodin  Home Medications   Prior to Admission medications   Medication Sig Start Date End Date Taking? Authorizing Provider  acetaminophen (TYLENOL) 500 MG tablet Take 1,000 mg by mouth every 6 (six) hours as needed for mild pain.    Historical Provider, MD  Cranberry 500 MG CAPS Take 1 capsule by mouth daily.    Historical Provider, MD  diphenhydrAMINE  (BENADRYL) 25 mg capsule Take 25 mg by mouth every 6 (six) hours as needed for itching or allergies.    Historical Provider, MD  ibuprofen (ADVIL,MOTRIN) 200 MG tablet Take 800 mg by mouth every 6 (six) hours as needed for moderate pain or cramping.    Historical Provider, MD  Multiple Vitamin (MULTIVITAMIN WITH MINERALS) TABS tablet Take 1 tablet by mouth daily.    Historical Provider, MD  norgestimate-ethinyl estradiol (ORTHO-CYCLEN, 28,) 0.25-35 MG-MCG tablet Take 1 tablet by mouth daily. 05/10/15   West Pugh, NP  ondansetron (ZOFRAN) 4 MG tablet Take 1 tablet (4 mg total) by mouth every 6 (six) hours. 05/10/15   West Pugh, NP  promethazine (PHENERGAN) 25 MG tablet Take 1 tablet (25 mg total) by mouth every 6 (six) hours as needed for nausea or vomiting. 05/10/15   West Pugh, NP  tamsulosin (FLOMAX) 0.4 MG CAPS capsule Take 1 capsule (0.4 mg total) by mouth daily. Patient taking differently: Take 0.4 mg by mouth daily as needed (urinary pain).  12/26/14   Tanna Furry, MD  vitamin C (ASCORBIC ACID) 500 MG tablet Take 500 mg by mouth daily.    Historical Provider, MD   BP 116/82 mmHg  Pulse 74  Temp(Src) 98 F (36.7 C) (  Oral)  Resp 18  Ht 5' 2"  (1.575 m)  Wt 154 lb (69.854 kg)  BMI 28.16 kg/m2  SpO2 99%  LMP 06/15/2015 Physical Exam  Constitutional: She appears well-developed and well-nourished. No distress.  HENT:  Head: Atraumatic.  Eyes: Conjunctivae are normal.  Neck: Neck supple.  Cardiovascular: Normal rate and regular rhythm.   Pulmonary/Chest: Effort normal and breath sounds normal.  Abdominal: Soft. There is no tenderness.  Genitourinary:  Chaperone present on exam. No inguinal hernia or inguinal lymphadenopathy.  Normal external genitalia.  No pain with speculum insertion.  Mild functional vaginal discharge.  Closed cervical os free or lesion or rash.  On bimanual examination no adnexal tenderness or CMT.    Neurological: She is alert.  Skin: No rash  noted.  Psychiatric: She has a normal mood and affect.  Nursing note and vitals reviewed.   ED Course  Procedures (including critical care time)  Pt concerning of STD after boyfriend report he has trichomonas.  Pelvic examination unremarkable.  Rocephin/Zithromax prophylaxis meds given.    6:46 PM Evidence of trichomonas on wet prep and on UA. Patient will receive Flagyl 2 g by mouth in the ER. Her urine showed evidence suggesting urinary tract infection. She does have urinary discomfort therefore she'll be treated for UTI as well. Due to the risk of STDs and having urinary complaint she will be prescribed doxycycline for the next 7 days. Recommend avoid sexual activities until symptoms resolved. Return precautions discussed.  Labs Review Labs Reviewed  WET PREP, GENITAL - Abnormal; Notable for the following:    Trich, Wet Prep MANY (*)    WBC, Wet Prep HPF POC MANY (*)    All other components within normal limits  URINALYSIS, ROUTINE W REFLEX MICROSCOPIC (NOT AT [facility]) - Abnormal; Notable for the following:    Color, Urine AMBER (*)    APPearance CLOUDY (*)    Specific Gravity, Urine 1.034 (*)    Hgb urine dipstick TRACE (*)    Leukocytes, UA LARGE (*)    All other components within normal limits  URINE MICROSCOPIC-ADD ON - Abnormal; Notable for the following:    Squamous Epithelial / LPF MANY (*)    Bacteria, UA FEW (*)    All other components within normal limits  RPR  HIV ANTIBODY (ROUTINE TESTING)  I-STAT BETA HCG BLOOD, ED ([facility], [facility], [facility] ONLY)  I-STAT BETA HCG BLOOD, ED ([facility], [facility], [facility] ONLY)  GC/CHLAMYDIA PROBE AMP ([place]) NOT AT [facility]    Imaging Review No results found. I, Trenyce Loera, personally reviewed and evaluated these images and lab results as part of my medical decision-making.   EKG Interpretation None      MDM   Final diagnoses:  Trichomonas infection  UTI (lower urinary tract infection)    BP 118/76 mmHg  Pulse 72  Temp(Src) 98 F (36.7 C) (Oral)   Resp 16  Ht 5' 2"  (1.575 m)  Wt 154 lb (69.854 kg)  BMI 28.16 kg/m2  SpO2 99%  LMP 06/15/2015     Domenic Moras, PA-C 07/02/15 [place] Yao, MD 07/02/15 2302

## 2015-07-02 NOTE — Discharge Instructions (Signed)

## 2015-07-03 LAB — HIV ANTIBODY (ROUTINE TESTING W REFLEX): HIV Screen 4th Generation wRfx: NONREACTIVE

## 2015-07-03 LAB — RPR: RPR Ser Ql: NONREACTIVE

## 2015-07-05 LAB — GC/CHLAMYDIA PROBE AMP (~~LOC~~) NOT AT ARMC
Chlamydia: NEGATIVE
NEISSERIA GONORRHEA: NEGATIVE

## 2016-05-06 ENCOUNTER — Inpatient Hospital Stay (HOSPITAL_COMMUNITY)
Admission: AD | Admit: 2016-05-06 | Discharge: 2016-05-06 | Disposition: A | Discharge status: Home / Self Care | Payer: Self-pay | Source: Ambulatory Visit | Attending: Obstetrics and Gynecology | Admitting: Obstetrics and Gynecology

## 2016-05-06 ENCOUNTER — Encounter (HOSPITAL_COMMUNITY): Payer: Self-pay | Admitting: *Deleted

## 2016-05-06 ENCOUNTER — Inpatient Hospital Stay (HOSPITAL_COMMUNITY): Payer: Self-pay

## 2016-05-06 DIAGNOSIS — N946 Dysmenorrhea, unspecified: Secondary | ICD-10-CM | POA: Insufficient documentation

## 2016-05-06 DIAGNOSIS — R197 Diarrhea, unspecified: Secondary | ICD-10-CM | POA: Insufficient documentation

## 2016-05-06 DIAGNOSIS — F17210 Nicotine dependence, cigarettes, uncomplicated: Secondary | ICD-10-CM | POA: Insufficient documentation

## 2016-05-06 DIAGNOSIS — R1110 Vomiting, unspecified: Secondary | ICD-10-CM | POA: Insufficient documentation

## 2016-05-06 DIAGNOSIS — Z885 Allergy status to narcotic agent status: Secondary | ICD-10-CM | POA: Insufficient documentation

## 2016-05-06 DIAGNOSIS — N859 Noninflammatory disorder of uterus, unspecified: Secondary | ICD-10-CM | POA: Insufficient documentation

## 2016-05-06 DIAGNOSIS — D259 Leiomyoma of uterus, unspecified: Secondary | ICD-10-CM | POA: Insufficient documentation

## 2016-05-06 DIAGNOSIS — N939 Abnormal uterine and vaginal bleeding, unspecified: Secondary | ICD-10-CM | POA: Insufficient documentation

## 2016-05-06 DIAGNOSIS — R109 Unspecified abdominal pain: Secondary | ICD-10-CM

## 2016-05-06 LAB — POCT PREGNANCY, URINE: Preg Test, Ur: NEGATIVE

## 2016-05-06 LAB — CBC
HCT: 39.2 % (ref 36.0–46.0)
Hemoglobin: 12.9 g/dL (ref 12.0–15.0)
MCH: 29.7 pg (ref 26.0–34.0)
MCHC: 32.9 g/dL (ref 30.0–36.0)
MCV: 90.3 fL (ref 78.0–100.0)
PLATELETS: 296 10*3/uL (ref 150–400)
RBC: 4.34 MIL/uL (ref 3.87–5.11)
RDW: 13.8 % (ref 11.5–15.5)
WBC: 10.2 10*3/uL (ref 4.0–10.5)

## 2016-05-06 LAB — WET PREP, GENITAL
Clue Cells Wet Prep HPF POC: NONE SEEN
Sperm: NONE SEEN
Trich, Wet Prep: NONE SEEN
YEAST WET PREP: NONE SEEN

## 2016-05-06 LAB — URINALYSIS, ROUTINE W REFLEX MICROSCOPIC
GLUCOSE, UA: NEGATIVE mg/dL
Ketones, ur: 15 mg/dL — AB
Nitrite: NEGATIVE
PH: 7 (ref 5.0–8.0)
Protein, ur: 100 mg/dL — AB
Specific Gravity, Urine: 1.01 (ref 1.005–1.030)

## 2016-05-06 LAB — URINE MICROSCOPIC-ADD ON

## 2016-05-06 MED ORDER — NORGESTIMATE-ETH ESTRADIOL 0.25-35 MG-MCG PO TABS: 1.0000 | ORAL_TABLET | Freq: Every day | ORAL | Status: DC

## 2016-05-06 MED ORDER — PROMETHAZINE HCL 25 MG/ML IJ SOLN
25.0000 mg | Freq: Once | INTRAVENOUS | Status: AC
  Administered 2016-05-06: 25 mg via INTRAVENOUS
  Filled 2016-05-06: qty 1

## 2016-05-06 MED ORDER — KETOROLAC TROMETHAMINE 60 MG/2ML IM SOLN
60.0000 mg | Freq: Once | INTRAMUSCULAR | Status: AC
  Administered 2016-05-06: 60 mg via INTRAMUSCULAR
  Filled 2016-05-06: qty 2

## 2016-05-06 MED ORDER — KETOROLAC TROMETHAMINE 10 MG PO TABS: 10.0000 mg | ORAL_TABLET | Freq: Four times a day (QID) | ORAL | Status: DC | PRN

## 2016-05-06 NOTE — MAU Note (Signed)
6 months of irregular, heavy bleeding and abdominal pain. States last 4 days has had worsening pain, vomiting, diarrhea, lightheadedness. States endometriosis runs in her family and she says she has the same sxs.

## 2016-05-06 NOTE — Discharge Instructions (Signed)
Stop all smoking. Use slow deep breathing to relax your body at least 3 times a day. If the pills control your bleeding and pain, [facility]. If the pills do not control your bleeding or pain, be seen at the [facility] clinic downstairs for further evaluation. Your body is handling your bleeding well - your hemoglobin is normal. Continue the ibuprofen and tylenol as discussed for 24 hours.  Use Toradol pills only if needed for severe cramping.

## 2016-05-06 NOTE — MAU Provider Note (Signed)
History     CSN: [id number]  Arrival date and time: 05/06/16 7858   First Provider Initiated Contact with Patient 05/06/16 [id number]      Chief Complaint  Patient presents with  . Abdominal Pain  . Vaginal Bleeding   HPI  Jeanne Mcneil 30 y.o.   Comes to MAU with heavy vaginal bleeding and cramping.  Has been taking 2 Ibuprofen 864m at once for cramping - last dose was 9pm last night.  Is not using any hormonal contraception for past 4 years.  Menses are every 3.5 weeks for 6-7 days with heavy cramping.  For the past few months, her periods are getting heavier and more painful.  Used Mirena IUD x 5 years but did not like it and needs not to have any thing in her body.  Has a family history of endometriosis and thinks that is causing her problem. Uses condoms for contraception but not consistently.  Is having vomiting, diarrhea and severe lower abdominal pain.  Tearful with pain and frustration.  Does not have insurance and has not been seen by a doctor.   OB History    Gravida Para Term Preterm AB TAB SAB Ectopic Multiple Living   3 2 2  1  1   2       Past Medical History  Diagnosis Date  . Hypercalcuria   . Glaucoma   . Fibroid uterus     fibroid lymphoma    Past Surgical History  Procedure Laterality Date  . Kidney stents      History reviewed. No pertinent family history.  Social History  Substance Use Topics  . Smoking status: Current Every Day Smoker -- 0.50 packs/day    Types: Cigarettes  . Smokeless tobacco: None  . Alcohol Use: 2.4 oz/week    4 Shots of liquor per week     Comment: occ    Allergies:  Allergies  Allergen Reactions  . Codeine Hives and Itching    Able to take Dilaudid and Ultram.  . Percocet [Oxycodone-Acetaminophen] Hives and Itching  . Vicodin [Hydrocodone-Acetaminophen] Hives and Itching    Prescriptions prior to admission  Medication Sig Dispense Refill Last Dose  . ibuprofen (ADVIL,MOTRIN) 200 MG tablet Take 800 mg by mouth every  6 (six) hours as needed for moderate pain or cramping.   05/05/2016 at Unknown time  . doxycycline (VIBRAMYCIN) 100 MG capsule Take 1 capsule (100 mg total) by mouth 2 (two) times daily. (Patient not taking: Reported on 05/06/2016) 20 capsule 0   . norgestimate-ethinyl estradiol (ORTHO-CYCLEN, 28,) 0.25-35 MG-MCG tablet Take 1 tablet by mouth daily. (Patient not taking: Reported on 07/02/2015) 1 Package 11 Not Taking at Unknown time  . ondansetron (ZOFRAN) 4 MG tablet Take 1 tablet (4 mg total) by mouth every 6 (six) hours. (Patient not taking: Reported on 07/02/2015) 12 tablet 0 Not Taking at Unknown time  . promethazine (PHENERGAN) 25 MG tablet Take 1 tablet (25 mg total) by mouth every 6 (six) hours as needed for nausea or vomiting. (Patient not taking: Reported on 07/02/2015) 30 tablet 0 Not Taking at Unknown time  . tamsulosin (FLOMAX) 0.4 MG CAPS capsule Take 1 capsule (0.4 mg total) by mouth daily. (Patient not taking: Reported on 07/02/2015) 7 capsule 0 Not Taking at Unknown time    Review of Systems  Constitutional: Negative for fever.  Gastrointestinal: Positive for nausea, vomiting and abdominal pain.  Genitourinary:       No vaginal discharge. Vaginal bleeding. No dysuria.  Physical Exam   Blood pressure 114/94, pulse 77, temperature 98.4 F (36.9 C), temperature source Oral, resp. rate 18, height 5' 2"  (1.575 m), weight 155 lb (70.308 kg), last menstrual period 04/09/2016, SpO2 100 %.  Physical Exam  Nursing note and vitals reviewed. Constitutional: She is oriented to person, place, and time. She appears well-developed and well-nourished.  HENT:  Head: Normocephalic.  Eyes: EOM are normal.  Neck: Neck supple.  GI: Soft. There is tenderness. There is no rebound and no guarding.  Genitourinary:  Speculum exam: Vagina - Moderate amount of blood, no odor, large pad is soaked with blood Cervix - Small amount of active bleeding Bimanual exam: Cervix closed Uterus tender, normal  size Adnexa non tender, no masses bilaterally GC/Chlam, wet prep done Chaperone present for exam.  Musculoskeletal: Normal range of motion.  Neurological: She is alert and oriented to person, place, and time.  Skin: Skin is warm and dry.  Psychiatric: She has a normal mood and affect.    MAU Course  Procedures Results for orders placed or performed during the hospital encounter of 05/06/16 (from the past 24 hour(s))  Urinalysis, Routine w reflex microscopic (not at [facility])     Status: Abnormal   Collection Time: 05/06/16  8:26 AM  Result Value Ref Range   Color, Urine AMBER (A) YELLOW   APPearance CLOUDY (A) CLEAR   Specific Gravity, Urine 1.010 1.005 - 1.030   pH 7.0 5.0 - 8.0   Glucose, UA NEGATIVE NEGATIVE mg/dL   Hgb urine dipstick LARGE (A) NEGATIVE   Bilirubin Urine SMALL (A) NEGATIVE   Ketones, ur 15 (A) NEGATIVE mg/dL   Protein, ur 100 (A) NEGATIVE mg/dL   Nitrite NEGATIVE NEGATIVE   Leukocytes, UA TRACE (A) NEGATIVE  Urine microscopic-add on     Status: Abnormal   Collection Time: 05/06/16  8:26 AM  Result Value Ref Range   Squamous Epithelial / LPF 0-5 (A) NONE SEEN   WBC, UA 0-5 0 - 5 WBC/hpf   RBC / HPF TOO NUMEROUS TO COUNT 0 - 5 RBC/hpf   Bacteria, UA MANY (A) NONE SEEN  Pregnancy, urine POC     Status: None   Collection Time: 05/06/16  8:33 AM  Result Value Ref Range   Preg Test, Ur NEGATIVE NEGATIVE  CBC     Status: None   Collection Time: 05/06/16 10:04 AM  Result Value Ref Range   WBC 10.2 4.0 - 10.5 K/uL   RBC 4.34 3.87 - 5.11 MIL/uL   Hemoglobin 12.9 12.0 - 15.0 g/dL   HCT 39.2 36.0 - 46.0 %   MCV 90.3 78.0 - 100.0 fL   MCH 29.7 26.0 - 34.0 pg   MCHC 32.9 30.0 - 36.0 g/dL   RDW 13.8 11.5 - 15.5 %   Platelets 296 150 - 400 K/uL  Wet prep, genital     Status: Abnormal   Collection Time: 05/06/16 12:46 PM  Result Value Ref Range   Yeast Wet Prep HPF POC NONE SEEN NONE SEEN   Trich, Wet Prep NONE SEEN NONE SEEN   Clue Cells Wet Prep HPF POC NONE  SEEN NONE SEEN   WBC, Wet Prep HPF POC FEW (A) NONE SEEN   Sperm NONE SEEN     MDM  CLINICAL DATA: Patient with irregular heavy vaginal bleeding. Abdominal pain.  EXAM: TRANSABDOMINAL AND TRANSVAGINAL ULTRASOUND OF PELVIS  TECHNIQUE: Both transabdominal and transvaginal ultrasound examinations of the pelvis were performed. Transabdominal technique was performed for global  imaging of the pelvis including uterus, ovaries, adnexal regions, and pelvic cul-de-sac. It was necessary to proceed with endovaginal exam following the transabdominal exam to visualize the endometrium.  COMPARISON: Pelvic ultrasound 05/10/2015.  FINDINGS: Uterus  Measurements: 8.6 x 5.2 x 6.3 cm. There is a 2.9 x 2.6 x 2.8 cm fibroid within the left uterine fundus.  Endometrium  Thickness: 3 mm. There is a 2.3 x 1.4 x 1.1 cm echogenic mass within the endometrium with associated vascular flow.  Right ovary  Measurements: 3.0 x 1.5 x 2.8 cm. Normal appearance/no adnexal mass.  Left ovary  Measurements: 2.7 x 1.8 x 3.2 cm. Normal appearance/no adnexal mass.  Other findings  No abnormal free fluid.  IMPRESSION: There is a 2.3 cm vascularized echogenic mass within the endometrium. Considerations include endometrial polyp, focal endometrial hyperplasia or potentially endometrial carcinoma. Consider further evaluation with sonohysterogram for confirmation prior to hysteroscopy. Endometrial sampling should also be considered if patient is at high risk for endometrial carcinoma. (Ref: Radiological Reasoning: Algorithmic Workup of Abnormal Vaginal Bleeding with Endovaginal Sonography and Sonohysterography. AJR 2008; 056:P79-48)    Given phenergan 25 mg in 1000cc of LR to ease her nausea from the pain. Consult with Dr. Ilda Basset and reviewed the plan of care.   Assessment and Plan  Dysmenorrhea and heavy bleeding 2.3 cm echogenic mass in the endometrium Fibroid noted in the  uterine fundus  Plan Will eprescribe birth control pills for 2 months. Follow up in the clinic if there is no improvement - for further evaluation. [facility] menses. Advised to stop all smoking. Limit Ibuprofen to 800 mg every 8 hours.;  May also take a dose of tylenol following the package directions.  Gerrell Tabet 05/06/2016, 9:46 AM

## 2016-05-08 LAB — GC/CHLAMYDIA PROBE AMP (~~LOC~~) NOT AT ARMC
CHLAMYDIA, DNA PROBE: NEGATIVE
Neisseria Gonorrhea: NEGATIVE

## 2016-05-12 LAB — HIV ANTIBODY (ROUTINE TESTING W REFLEX): HIV Screen 4th Generation wRfx: NONREACTIVE

## 2016-05-12 LAB — RPR: RPR Ser Ql: NONREACTIVE

## 2016-05-14 ENCOUNTER — Emergency Department (HOSPITAL_COMMUNITY): Payer: Medicaid Other

## 2016-05-14 ENCOUNTER — Emergency Department (HOSPITAL_COMMUNITY)
Admission: EM | Admit: 2016-05-14 | Discharge: 2016-05-14 | Disposition: A | Discharge status: Home / Self Care | Payer: Medicaid Other | Attending: Emergency Medicine | Admitting: Emergency Medicine

## 2016-05-14 ENCOUNTER — Encounter (HOSPITAL_COMMUNITY): Payer: Self-pay | Admitting: *Deleted

## 2016-05-14 DIAGNOSIS — K2971 Gastritis, unspecified, with bleeding: Secondary | ICD-10-CM | POA: Diagnosis not present

## 2016-05-14 DIAGNOSIS — R1013 Epigastric pain: Secondary | ICD-10-CM | POA: Diagnosis present

## 2016-05-14 DIAGNOSIS — F17210 Nicotine dependence, cigarettes, uncomplicated: Secondary | ICD-10-CM | POA: Diagnosis not present

## 2016-05-14 DIAGNOSIS — Z79899 Other long term (current) drug therapy: Secondary | ICD-10-CM | POA: Diagnosis not present

## 2016-05-14 DIAGNOSIS — K922 Gastrointestinal hemorrhage, unspecified: Secondary | ICD-10-CM

## 2016-05-14 DIAGNOSIS — K2970 Gastritis, unspecified, without bleeding: Secondary | ICD-10-CM

## 2016-05-14 LAB — COMPREHENSIVE METABOLIC PANEL
ALT: 34 U/L (ref 14–54)
AST: 29 U/L (ref 15–41)
Albumin: 4.6 g/dL (ref 3.5–5.0)
Alkaline Phosphatase: 68 U/L (ref 38–126)
Anion gap: 11 (ref 5–15)
BILIRUBIN TOTAL: 0.9 mg/dL (ref 0.3–1.2)
BUN: 14 mg/dL (ref 6–20)
CALCIUM: 9.5 mg/dL (ref 8.9–10.3)
CO2: 20 mmol/L — ABNORMAL LOW (ref 22–32)
Chloride: 106 mmol/L (ref 101–111)
Creatinine, Ser: 0.83 mg/dL (ref 0.44–1.00)
GFR calc Af Amer: >60 mL/min (ref >60)
GFR calc non Af Amer: >60 mL/min (ref >60)
GLUCOSE: 160 mg/dL — AB (ref 65–99)
Potassium: 3.4 mmol/L — ABNORMAL LOW (ref 3.5–5.1)
Sodium: 137 mmol/L (ref 135–145)
TOTAL PROTEIN: 7.7 g/dL (ref 6.5–8.1)

## 2016-05-14 LAB — CBC
HCT: 44.6 % (ref 36.0–46.0)
Hemoglobin: 15 g/dL (ref 12.0–15.0)
MCH: 30 pg (ref 26.0–34.0)
MCHC: 33.6 g/dL (ref 30.0–36.0)
MCV: 89.2 fL (ref 78.0–100.0)
PLATELETS: 403 10*3/uL — AB (ref 150–400)
RBC: 5 MIL/uL (ref 3.87–5.11)
RDW: 13.4 % (ref 11.5–15.5)
WBC: 20.5 10*3/uL — ABNORMAL HIGH (ref 4.0–10.5)

## 2016-05-14 LAB — POC OCCULT BLOOD, ED: FECAL OCCULT BLD: POSITIVE — AB

## 2016-05-14 LAB — TYPE AND SCREEN
ABO/RH(D): A POS
ANTIBODY SCREEN: NEGATIVE

## 2016-05-14 LAB — URINALYSIS, ROUTINE W REFLEX MICROSCOPIC
Glucose, UA: NEGATIVE mg/dL
KETONES UR: 15 mg/dL — AB
Leukocytes, UA: NEGATIVE
Nitrite: NEGATIVE
PROTEIN: 100 mg/dL — AB
Specific Gravity, Urine: >1.03 — ABNORMAL HIGH (ref 1.005–1.030)
pH: 6 (ref 5.0–8.0)

## 2016-05-14 LAB — URINE MICROSCOPIC-ADD ON

## 2016-05-14 LAB — I-STAT BETA HCG BLOOD, ED (MC, WL, AP ONLY): I-stat hCG, quantitative: <5 m[IU]/mL (ref <5)

## 2016-05-14 LAB — LIPASE, BLOOD: LIPASE: 19 U/L (ref 11–51)

## 2016-05-14 MED ORDER — HYDROMORPHONE HCL 1 MG/ML IJ SOLN
1.0000 mg | Freq: Once | INTRAMUSCULAR | Status: AC
  Administered 2016-05-14: 1 mg via INTRAVENOUS
  Filled 2016-05-14: qty 1

## 2016-05-14 MED ORDER — DIATRIZOATE MEGLUMINE & SODIUM 66-10 % PO SOLN
ORAL | Status: AC
  Administered 2016-05-14: 30 mL
  Filled 2016-05-14: qty 30

## 2016-05-14 MED ORDER — SODIUM CHLORIDE 0.9 % IV BOLUS (SEPSIS)
1000.0000 mL | Freq: Once | INTRAVENOUS | Status: AC
  Administered 2016-05-14: 1000 mL via INTRAVENOUS

## 2016-05-14 MED ORDER — LANSOPRAZOLE 30 MG PO CPDR: 30.0000 mg | DELAYED_RELEASE_CAPSULE | Freq: Every day | ORAL | Status: DC

## 2016-05-14 MED ORDER — IOPAMIDOL (ISOVUE-300) INJECTION 61%
100.0000 mL | Freq: Once | INTRAVENOUS | Status: AC | PRN
  Administered 2016-05-14: 100 mL via INTRAVENOUS

## 2016-05-14 MED ORDER — SUCRALFATE 1 GM/10ML PO SUSP: 1.0000 g | Freq: Three times a day (TID) | ORAL | Status: DC

## 2016-05-14 MED ORDER — FAMOTIDINE IN NACL 20-0.9 MG/50ML-% IV SOLN
20.0000 mg | Freq: Once | INTRAVENOUS | Status: AC
  Administered 2016-05-14: 20 mg via INTRAVENOUS
  Filled 2016-05-14: qty 50

## 2016-05-14 MED ORDER — ONDANSETRON HCL 4 MG/2ML IJ SOLN
4.0000 mg | Freq: Once | INTRAMUSCULAR | Status: AC | PRN
  Administered 2016-05-14: 4 mg via INTRAVENOUS
  Filled 2016-05-14: qty 2

## 2016-05-14 MED ORDER — ONDANSETRON HCL 4 MG PO TABS: 4.0000 mg | ORAL_TABLET | Freq: Three times a day (TID) | ORAL | Status: DC | PRN

## 2016-05-14 NOTE — ED Notes (Signed)
Pt ambulated to bathroom. Pt getting urine specimen and hemmocult.

## 2016-05-14 NOTE — ED Notes (Signed)
Pt called nurse to room. Pt pulled out IV.

## 2016-05-14 NOTE — Discharge Instructions (Signed)
Gastritis, Adult Gastritis is soreness and puffiness (inflammation) of the lining of the stomach. If you do not get help, gastritis can cause bleeding and sores (ulcers) in the stomach. HOME CARE   Only take medicine as told by your doctor.  If you were given antibiotic medicines, take them as told. Finish the medicines even if you start to feel better.  Drink enough fluids to keep your pee (urine) clear or pale yellow.  Avoid foods and drinks that make your problems worse. Foods you may want to avoid include:  Caffeine or alcohol.  Chocolate.  Mint.  Garlic and onions.  Spicy foods.  Citrus fruits, including oranges, lemons, or limes.  Food containing tomatoes, including sauce, chili, salsa, and pizza.  Fried and fatty foods.  Eat small meals throughout the day instead of large meals. GET HELP RIGHT AWAY IF:   You have black or dark red poop (stools).  You throw up (vomit) blood. It may look like coffee grounds.  You cannot keep fluids down.  Your belly (abdominal) pain gets worse.  You have a fever.  You do not feel better after 1 week.  You have any other questions or concerns. MAKE SURE YOU:   Understand these instructions.  Will watch your condition.  Will get help right away if you are not doing well or get worse.   This information is not intended to replace advice given to you by your health care provider. Make sure you discuss any questions you have with your health care provider.   Document Released: 04/24/2008 Document Revised: 01/29/2012 Document Reviewed: 12/20/2011 Elsevier Interactive Patient Education 2016 [place] for Gastroesophageal Reflux Disease, Adult When you have gastroesophageal reflux disease (GERD), the foods you eat and your eating habits are very important. Choosing the right foods can help ease the discomfort of GERD. WHAT GENERAL GUIDELINES DO I NEED TO FOLLOW?  Choose fruits, vegetables, whole grains,  low-fat dairy products, and low-fat meat, fish, and poultry.  Limit fats such as oils, salad dressings, butter, nuts, and avocado.  Keep a food diary to identify foods that cause symptoms.  Avoid foods that cause reflux. These may be different for different people.  Eat frequent small meals instead of three large meals each day.  Eat your meals slowly, in a relaxed setting.  Limit fried foods.  Cook foods using methods other than frying.  Avoid drinking alcohol.  Avoid drinking large amounts of liquids with your meals.  Avoid bending over or lying down until 2-3 hours after eating. WHAT FOODS ARE NOT RECOMMENDED? The following are some foods and drinks that may worsen your symptoms: Vegetables Tomatoes. Tomato juice. Tomato and spaghetti sauce. Chili peppers. Onion and garlic. Horseradish. Fruits Oranges, grapefruit, and lemon (fruit and juice). Meats High-fat meats, fish, and poultry. This includes hot dogs, ribs, ham, sausage, salami, and bacon. Dairy Whole milk and chocolate milk. Sour cream. Cream. Butter. Ice cream. Cream cheese.  Beverages Coffee and tea, with or without caffeine. Carbonated beverages or energy drinks. Condiments Hot sauce. Barbecue sauce.  Sweets/Desserts Chocolate and cocoa. Donuts. Peppermint and spearmint. Fats and Oils High-fat foods, including [place] fries and potato chips. Other Vinegar. Strong spices, such as black pepper, white pepper, red pepper, cayenne, curry powder, cloves, ginger, and chili powder. The items listed above may not be a complete list of foods and beverages to avoid. Contact your dietitian for more information.   This information is not intended to replace advice given to you by  your health care provider. Make sure you discuss any questions you have with your health care provider.   Document Released: 11/06/2005 Document Revised: 11/27/2014 Document Reviewed: 09/10/2013 Elsevier Interactive Patient Education [employer]

## 2016-05-14 NOTE — ED Notes (Signed)
Patient came out of room stating that her heart monitor leads were itching that she was removing the leads.

## 2016-05-14 NOTE — ED Provider Notes (Signed)
CSN: [id number]     Arrival date & time 05/14/16  1937 History   First MD Initiated Contact with Patient 05/14/16 0750     Chief Complaint  Patient presents with  . Abdominal Pain     (Consider location/radiation/quality/duration/timing/severity/associated sxs/prior Treatment) HPI Patient is a very difficult historian. She presents with abdominal pain starting at 0430 today. Been associated with multiple episodes of vomiting and diarrhea. Patient states she's had red blood in the vomit and has noticed dark tarry stools. She denies any fever or chills. She denies urinary symptoms including hematuria, dysuria, frequency or urgency. Denies any recent vaginal bleeding or discharge. The pain is in her upper abdomen and radiates to her complete abdomen. States she has a history of "polyps" and she believes this is causing her pain. Past Medical History  Diagnosis Date  . Hypercalcuria   . Glaucoma   . Fibroid uterus     fibroid lymphoma   Past Surgical History  Procedure Laterality Date  . Kidney stents     No family history on file. Social History  Substance Use Topics  . Smoking status: Current Every Day Smoker -- 0.50 packs/day    Types: Cigarettes  . Smokeless tobacco: None  . Alcohol Use: 2.4 oz/week    4 Shots of liquor per week     Comment: occ   OB History    Gravida Para Term Preterm AB TAB SAB Ectopic Multiple Living   3 2 2  1  1   2      Review of Systems  Constitutional: Negative for fever and chills.  Respiratory: Negative for shortness of breath.   Cardiovascular: Negative for chest pain.  Gastrointestinal: Positive for nausea, vomiting, abdominal pain and diarrhea.  Genitourinary: Negative for dysuria, hematuria, flank pain, vaginal bleeding, vaginal discharge, difficulty urinating and pelvic pain.  Musculoskeletal: Negative for myalgias, back pain, neck pain and neck stiffness.  Skin: Negative for rash and wound.  Neurological: Negative for dizziness, weakness,  light-headedness, numbness and headaches.  All other systems reviewed and are negative.     Allergies  Codeine; Percocet; and Vicodin  Home Medications   Prior to Admission medications   Medication Sig Start Date End Date Taking? Authorizing Provider  norgestimate-ethinyl estradiol (ORTHO-CYCLEN,SPRINTEC,PREVIFEM) 0.25-35 MG-MCG tablet Take 1 tablet by mouth daily. 05/06/16  Yes Virginia Rochester, NP  ketorolac (TORADOL) 10 MG tablet Take 1 tablet (10 mg total) by mouth every 6 (six) hours as needed (Do not take Ibuprofen with this medication.). Patient not taking: Reported on 05/14/2016 05/06/16   Virginia Rochester, NP  lansoprazole (PREVACID) 30 MG capsule Take 1 capsule (30 mg total) by mouth daily at 12 noon. 05/14/16   Julianne Rice, MD  ondansetron (ZOFRAN) 4 MG tablet Take 1 tablet (4 mg total) by mouth every 8 (eight) hours as needed for nausea or vomiting. 05/14/16   Julianne Rice, MD  sucralfate (CARAFATE) 1 GM/10ML suspension Take 10 mLs (1 g total) by mouth 4 (four) times daily -  with meals and at bedtime. 05/14/16   Julianne Rice, MD   BP 177/92 mmHg  Pulse 62  Resp 21  Ht 5' 2"  (1.575 m)  Wt 140 lb (63.504 kg)  BMI 25.60 kg/m2  SpO2 95%  LMP 05/10/2016 Physical Exam  Constitutional: She is oriented to person, place, and time. She appears well-developed and well-nourished. She appears distressed.  Patient is histrionic. Writhing around stretcher. Frequently shouting out.  HENT:  Head: Normocephalic and atraumatic.  Mouth/Throat:  Oropharynx is clear and moist. No oropharyngeal exudate.  Eyes: EOM are normal. Pupils are equal, round, and reactive to light.  Neck: Normal range of motion. Neck supple.  Cardiovascular: Normal rate and regular rhythm.  Exam reveals no gallop and no friction rub.   No murmur heard. Pulmonary/Chest: Effort normal and breath sounds normal. No respiratory distress. She has no wheezes. She has no rales. She exhibits no tenderness.  Abdominal:  Soft. She exhibits no distension and no mass. There is tenderness (difficult exam but patient appears to be most tender in the epigastric region. There is diffuse abdominal tenderness. There is no definite rebound or guarding.). There is no rebound and no guarding.  Diminished bowel sounds  Musculoskeletal: Normal range of motion. She exhibits no edema or tenderness.  No definite CVA tenderness. No lower extremity swelling or asymmetry. Distal pulses are equal and intact.  Neurological: She is alert and oriented to person, place, and time.  Moving all extremities without focal weakness. Sensation is intact.  Skin: Skin is warm and dry. No rash noted. No erythema.  Psychiatric:  Acting out.  Nursing note and vitals reviewed.   ED Course  Procedures (including critical care time) Labs Review Labs Reviewed  COMPREHENSIVE METABOLIC PANEL - Abnormal; Notable for the following:    Potassium 3.4 (*)    CO2 20 (*)    Glucose, Bld 160 (*)    All other components within normal limits  CBC - Abnormal; Notable for the following:    WBC 20.5 (*)    Platelets 403 (*)    All other components within normal limits  URINALYSIS, ROUTINE W REFLEX MICROSCOPIC (NOT AT [facility]) - Abnormal; Notable for the following:    Color, Urine AMBER (*)    Specific Gravity, Urine >1.030 (*)    Hgb urine dipstick LARGE (*)    Bilirubin Urine SMALL (*)    Ketones, ur 15 (*)    Protein, ur 100 (*)    All other components within normal limits  URINE MICROSCOPIC-ADD ON - Abnormal; Notable for the following:    Squamous Epithelial / LPF 6-30 (*)    Bacteria, UA MANY (*)    All other components within normal limits  POC OCCULT BLOOD, ED - Abnormal; Notable for the following:    Fecal Occult Bld POSITIVE (*)    All other components within normal limits  LIPASE, BLOOD  OCCULT BLOOD X 1 CARD TO LAB, STOOL  I-STAT BETA HCG BLOOD, ED ([facility], [facility], [facility] ONLY)  TYPE AND SCREEN    Imaging Review Ct Abdomen Pelvis W  Contrast  05/14/2016  CLINICAL DATA:  Gradually worsening, central and right lower quadrant abdominal pain beginning this morning. Associated nausea, vomiting, hematemesis, diarrhea, and melena. EXAM: CT ABDOMEN AND PELVIS WITH CONTRAST TECHNIQUE: Multidetector CT imaging of the abdomen and pelvis was performed using the standard protocol following bolus administration of intravenous contrast. CONTRAST:  113m ISOVUE-300 IOPAMIDOL (ISOVUE-300) INJECTION 61% COMPARISON:  Pelvic ultrasound 05/06/2016. CT abdomen and pelvis 12/26/2014. FINDINGS: The dependent subsegmental atelectasis is noted in the lung bases. There is no pleural effusion. The liver, gallbladder, spleen, adrenal glands, and pancreas have an unremarkable enhanced appearance. There are several nonobstructing right renal calculi which measure up to 3 mm in size, slightly larger than on the prior CT. There is also a new nonobstructing 3 mm calculus in the upper pole of the left kidney. No hydronephrosis, ureteral calculi, or ureteral dilatation is seen. Oral contrast is present in multiple nondilated  loops of small bowel to the level of the cecum. There is no evidence of bowel obstruction, and no gross bowel wall thickening is identified although the colon is nearly completely decompressed which limits evaluation. The appendix is identified in the right lower quadrant and is unremarkable. The bladder is largely decompressed. A 2.5 cm fibroid in the left aspect of the uterine fundus is unchanged. Endometrial thickening/mass is better characterized on recent ultrasound. The ovaries are grossly unremarkable. Abdominal aorta is normal in caliber. No free fluid or enlarged lymph nodes are identified. Mild lumbar dextroscoliosis is noted trace retrolisthesis of L3 on L4 and L4 on L5 is unchanged. Dysplastic left L5-S1 facet joint is again noted. IMPRESSION: 1. No acute abnormality identified in the abdomen or pelvis. 2. Bilateral nonobstructing renal calculi,  increased from the prior CT. 3. Uterine fibroid and endometrial abnormality, more fully characterized on recent ultrasound. Electronically Signed   By: Logan Bores M.D.   On: 05/14/2016 10:46   I have personally reviewed and evaluated these images and lab results as part of my medical decision-making.   EKG Interpretation None      MDM   Final diagnoses:  Gastritis  Upper GI bleed    Patient states she is feeling much better after medication. Hemoglobin is stable as well as vital signs. CT without any acute findings. Patient was guaiac positive. Discuss with Dr. Oneida Alar. We'll place on PPI and she will follow up with Dr. Oneida Alar tomorrow for likely endoscopy. She understands need to return immediately for any worsening pain, persistent vomiting, increased blood in vomit worsening tarry stools or lightheadedness.    Julianne Rice, MD 05/14/16 [phone]

## 2016-05-14 NOTE — ED Notes (Signed)
Pt comes in with abdominal pain starting at 0430 today. Pt has been vomiting since then. Pt states she has a history of polyps.

## 2016-05-16 ENCOUNTER — Ambulatory Visit (INDEPENDENT_AMBULATORY_CARE_PROVIDER_SITE_OTHER): Payer: Medicaid Other | Admitting: Nurse Practitioner

## 2016-05-16 ENCOUNTER — Encounter (INDEPENDENT_AMBULATORY_CARE_PROVIDER_SITE_OTHER): Payer: Self-pay

## 2016-05-16 ENCOUNTER — Encounter: Payer: Self-pay | Admitting: Nurse Practitioner

## 2016-05-16 ENCOUNTER — Other Ambulatory Visit: Payer: Self-pay

## 2016-05-16 VITALS — BP 140/90 | HR 76 | Temp 96.9°F | Ht 62.0 in | Wt 150.8 lb

## 2016-05-16 DIAGNOSIS — K920 Hematemesis: Secondary | ICD-10-CM | POA: Diagnosis not present

## 2016-05-16 DIAGNOSIS — R1013 Epigastric pain: Principal | ICD-10-CM

## 2016-05-16 DIAGNOSIS — R110 Nausea: Secondary | ICD-10-CM

## 2016-05-16 DIAGNOSIS — R195 Other fecal abnormalities: Secondary | ICD-10-CM

## 2016-05-16 DIAGNOSIS — G8929 Other chronic pain: Secondary | ICD-10-CM

## 2016-05-16 NOTE — Progress Notes (Addendum)
REVIEWED-NO ADDITIONAL RECOMMENDATIONS.  Primary Care Physician:  No PCP Per Patient Primary Gastroenterologist:  Dr. Oneida Alar  Chief Complaint  Patient presents with  . Hematemesis    HPI:   Jeanne Mcneil is a 30 y.o. female who presents on referral from the emergency department for hematemesis. The patietn was seen in the ED 05/14/16 and noted to be a difficult historian. Complained of abdominal pain since 0430 that mroning associated with multiple episodes of vomiting and diarrhea. Patient noted red blood in her vomit as well as dark tarry stools, no fever or chills. The pain is located in her upper abdomen and radiates throughout to the generalized abdomen. Noted that she has a history of polyps and believed that is causing her pain. Labs notable for white blood cell count of 20.5, proteinuria, ketonuria, hemoglobin in her urinalysis. He was also noted to be fecal occult blood positive on exam. Hemoglobin normal/stable at 15.0. CT of the abdomen and pelvis found no acute abnormality identified in the abdomen or pelvis, bilateral nonobstructing renal calculi increased from the prior CT, uterine fibroid with endometrial abnormality more fully characterize and retrieved ultrasound. She was discharged home on a PPI and recommended follow-up with GI for likely endoscopy.  Noted difficult historian. Today she states she has been taking Prevacid but it isn't really helping "it's not strong enough." Still with abdominal pain, not as bad if she doesn't eat. Can drink luke warm water, if she drinks anything cold it hurts. If she drinks anything too fast she'll get sick. Continued nausea, no vomiting. Last bowel movement was the day she went to the hospital, thinks she hasn't had a bowel movement due to decreased oral intake. Has lost 10 pounds. Denies bright red blood in her stools. States she does have dizziness when she's about to be sick. Denies dyspnea, syncope, near syncope. Denies any other upper or  lower GI symptoms.  States she used to take significant amounts of Ibuprofen 800 mg, has not taken any since going to the ED as they advised her to stop them. Denies ASA powder use. Has not taken Zofran or Carafate because she can't afford it.   Past Medical History  Diagnosis Date  . Hypercalcuria   . Glaucoma   . Fibroid uterus     fibroid lymphoma  . Gastritis     Past Surgical History  Procedure Laterality Date  . Kidney stents      Current Outpatient Prescriptions  Medication Sig Dispense Refill  . ketorolac (TORADOL) 10 MG tablet Take 1 tablet (10 mg total) by mouth every 6 (six) hours as needed (Do not take Ibuprofen with this medication.). 20 tablet 0  . lansoprazole (PREVACID) 30 MG capsule Take 1 capsule (30 mg total) by mouth daily at 12 noon. 30 capsule 0   No current facility-administered medications for this visit.    Allergies as of 05/16/2016 - Review Complete 05/16/2016  Allergen Reaction Noted  . Codeine Hives and Itching 05/17/2013  . Percocet [oxycodone-acetaminophen] Hives and Itching 09/16/2012  . Vicodin [hydrocodone-acetaminophen] Hives and Itching 09/16/2012    Family History  Problem Relation Age of Onset  . Colon cancer Paternal Uncle     Passed away 09-Feb-2015    Social History   Social History  . Marital Status: Single    Spouse Name: N/A  . Number of Children: N/A  . Years of Education: N/A   Occupational History  . Not on file.   Social History Main Topics  .  Smoking status: Current Every Day Smoker -- 0.20 packs/day    Types: Cigarettes  . Smokeless tobacco: Never Used     Comment: 3-4 cigarettes daily  . Alcohol Use: 2.4 oz/week    4 Shots of liquor per week     Comment: occasionally, once every 2-3 months.  . Drug Use: No  . Sexual Activity: Yes    Birth Control/ Protection: Condom   Other Topics Concern  . Not on file   Social History Narrative    Review of Systems: 10-point ROS negative except as per  HPI.    Physical Exam: BP 140/90 mmHg  Pulse 76  Temp(Src) 96.9 F (36.1 C) (Oral)  Ht 5' 2"  (1.575 m)  Wt 150 lb 12.8 oz (68.402 kg)  BMI 27.57 kg/m2  LMP 05/03/2016 (Exact Date) General:   Alert and oriented. Pleasant and cooperative. Well-nourished and well-developed.  Head:  Normocephalic and atraumatic. Eyes:  Without icterus, sclera clear and conjunctiva pink.  Ears:  Normal auditory acuity. Cardiovascular:  S1, S2 present without murmurs appreciated. Extremities without clubbing or edema. Respiratory:  Clear to auscultation bilaterally. No wheezes, rales, or rhonchi. No distress.  Gastrointestinal:  +BS, soft, and non-distended. RUQ/epigastric/periumbilical TTP noted. No HSM noted. No guarding or rebound. No masses appreciated.  Rectal:  Deferred  Musculoskalatal:  Symmetrical without gross deformities. Neurologic:  Alert and oriented x4;  grossly normal neurologically. Psych:  Alert and cooperative. Normal mood and affect. Heme/Lymph/Immune: No excessive bruising noted.    05/16/2016 8:35 AM   Disclaimer: This note was dictated with voice recognition software. Similar sounding words can inadvertently be transcribed and may not be corrected upon review.

## 2016-05-16 NOTE — Patient Instructions (Signed)
1. We'll schedule your procedure for you. 2. Continue taking her medications as the emergency room recommended. 3. Return for follow-up in 2 months.

## 2016-05-16 NOTE — Assessment & Plan Note (Signed)
Patient with heme positive stool, black tarry stools in the emergency department. Also noted epigastric/left upper quadrant pain. Has been taking Prevacid with not much improvement. Cannot afford Carafate or Zofran. She has not had a bowel movement since in the emergency department 2 days ago so she is unsure if she is still have black tarry stools. If she was having gross significant bleeding she would likely about a bowel movement. At this point we will proceed with upper endoscopy to further evaluate heme positive stool, hematemesis, epigastric abdominal pain. Recommend she continue PPI. Return for follow-up in 2 months.  Proceed with EGD with Dr. Oneida Alar in near future: the risks, benefits, and alternatives have been discussed with the patient in detail. The patient states understanding and desires to proceed.  Patient is not on any anticoagulants, anxiolytics, chronic pain medications, or antidepressants. She seems to be quite anxious and we will add 12.5 mg preprocedure Phenergan given her persistent nausea. This should help with her nausea and anxiety.

## 2016-05-16 NOTE — Progress Notes (Signed)
No pcp per patient 

## 2016-05-16 NOTE — Assessment & Plan Note (Signed)
Patient previously with hematemesis associated with nausea when she presented to the emergency department. She has not had any vomiting since leaving the emergency department taking Prevacid. She is not taking Carafate if she cannot afford it, not taking Zofran as she cannot afford it. Given her symptoms there is a concern for erosion, ulceration, gastritis, esophagitis. We will plan for upper endoscopy as noted below. Return for follow-up in 2 months.

## 2016-05-16 NOTE — Assessment & Plan Note (Signed)
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 Her hemoglobin was stable 2 days ago in the emergency department and she has not had any bowel movement since. Unlikely she has had an acute decline in hemoglobin. Recommend she continue Prevacid, we will arrange for upper endoscopy as noted below. Return for follow-up in 2 months.

## 2016-05-17 ENCOUNTER — Encounter (HOSPITAL_COMMUNITY)
Admission: RE | Disposition: A | Discharge status: Home / Self Care | Payer: Self-pay | Source: Ambulatory Visit | Attending: Gastroenterology

## 2016-05-17 ENCOUNTER — Ambulatory Visit (HOSPITAL_COMMUNITY)
Admission: RE | Admit: 2016-05-17 | Discharge: 2016-05-17 | Disposition: A | Discharge status: Home / Self Care | Payer: Medicaid Other | Source: Ambulatory Visit | Attending: Gastroenterology | Admitting: Gastroenterology

## 2016-05-17 ENCOUNTER — Encounter (HOSPITAL_COMMUNITY): Payer: Self-pay | Admitting: *Deleted

## 2016-05-17 DIAGNOSIS — R1013 Epigastric pain: Secondary | ICD-10-CM

## 2016-05-17 DIAGNOSIS — K319 Disease of stomach and duodenum, unspecified: Secondary | ICD-10-CM | POA: Insufficient documentation

## 2016-05-17 DIAGNOSIS — K921 Melena: Secondary | ICD-10-CM | POA: Insufficient documentation

## 2016-05-17 DIAGNOSIS — G8929 Other chronic pain: Secondary | ICD-10-CM | POA: Diagnosis not present

## 2016-05-17 DIAGNOSIS — K210 Gastro-esophageal reflux disease with esophagitis: Secondary | ICD-10-CM | POA: Diagnosis not present

## 2016-05-17 DIAGNOSIS — Z79899 Other long term (current) drug therapy: Secondary | ICD-10-CM | POA: Diagnosis not present

## 2016-05-17 DIAGNOSIS — F17210 Nicotine dependence, cigarettes, uncomplicated: Secondary | ICD-10-CM | POA: Diagnosis not present

## 2016-05-17 DIAGNOSIS — K2970 Gastritis, unspecified, without bleeding: Secondary | ICD-10-CM | POA: Diagnosis not present

## 2016-05-17 HISTORY — PX: ESOPHAGOGASTRODUODENOSCOPY: SHX5428

## 2016-05-17 SURGERY — EGD (ESOPHAGOGASTRODUODENOSCOPY)
Anesthesia: Moderate Sedation

## 2016-05-17 MED ORDER — PROMETHAZINE HCL 25 MG/ML IJ SOLN
12.5000 mg | Freq: Once | INTRAMUSCULAR | Status: AC
  Administered 2016-05-17: 12.5 mg via INTRAVENOUS

## 2016-05-17 MED ORDER — LIDOCAINE VISCOUS 2 % MT SOLN
OROMUCOSAL | Status: AC
  Filled 2016-05-17: qty 15

## 2016-05-17 MED ORDER — SODIUM CHLORIDE 0.9% FLUSH
INTRAVENOUS | Status: AC
  Filled 2016-05-17: qty 10

## 2016-05-17 MED ORDER — PROMETHAZINE HCL 25 MG/ML IJ SOLN
INTRAMUSCULAR | Status: AC
  Filled 2016-05-17: qty 1

## 2016-05-17 MED ORDER — SODIUM CHLORIDE 0.9 % IV SOLN
INTRAVENOUS | Status: DC
  Administered 2016-05-17: 08:00:00 via INTRAVENOUS

## 2016-05-17 MED ORDER — LANSOPRAZOLE 30 MG PO CPDR: DELAYED_RELEASE_CAPSULE | ORAL | Status: DC

## 2016-05-17 MED ORDER — MIDAZOLAM HCL 5 MG/5ML IJ SOLN
INTRAMUSCULAR | Status: AC
  Filled 2016-05-17: qty 10

## 2016-05-17 MED ORDER — MEPERIDINE HCL 100 MG/ML IJ SOLN
INTRAMUSCULAR | Status: DC | PRN
  Administered 2016-05-17 (×2): 50 mg via INTRAVENOUS
  Administered 2016-05-17 (×2): 25 mg via INTRAVENOUS

## 2016-05-17 MED ORDER — LIDOCAINE VISCOUS 2 % MT SOLN
OROMUCOSAL | Status: DC | PRN
  Administered 2016-05-17: 3 mL via OROMUCOSAL

## 2016-05-17 MED ORDER — MEPERIDINE HCL 100 MG/ML IJ SOLN
INTRAMUSCULAR | Status: AC
  Filled 2016-05-17: qty 2

## 2016-05-17 MED ORDER — PROMETHAZINE HCL 25 MG/ML IJ SOLN
INTRAMUSCULAR | Status: DC | PRN
  Administered 2016-05-17: 12.5 mg via INTRAVENOUS

## 2016-05-17 MED ORDER — MIDAZOLAM HCL 5 MG/5ML IJ SOLN
INTRAMUSCULAR | Status: DC | PRN
  Administered 2016-05-17 (×4): 2 mg via INTRAVENOUS

## 2016-05-17 MED ORDER — STERILE WATER FOR IRRIGATION IR SOLN
Status: DC | PRN
  Administered 2016-05-17: 09:00:00

## 2016-05-17 NOTE — Op Note (Signed)
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 Providers:                Barney Drain, MD, Jeanann Lewandowsky. Sharon Seller, RN, Randa Spike, Technician Referring MD:             Dr. Lita Mains Medicines:                Promethazine 25 mg IV, Meperidine 150 mg IV,                            Midazolam 8 mg IV Complications:            No immediate complications. Estimated Blood Loss:     Estimated blood loss was minimal. Procedure:                Pre-Anesthesia Assessment:                           - Prior to the procedure, a History and Physical                            was performed, and patient medications and                            allergies were reviewed. The patient's tolerance of                            previous anesthesia was also reviewed. The risks                            and benefits of the procedure and the sedation                            options and risks were discussed with the patient.                            All questions were answered, and informed consent                            was obtained. Prior Anticoagulants: The patient has                            taken ibuprofen, last dose was day of procedure.  ASA Grade Assessment: II - A patient with mild                            systemic disease. After reviewing the risks and                            benefits, the patient was deemed in satisfactory                            condition to undergo the procedure.                           After obtaining informed consent, the endoscope was                             passed under direct vision. Throughout the                            procedure, the patient's blood pressure, pulse, and                            oxygen saturations were monitored continuously. The                            [id number] ([id number]) scope was introduced through the                            mouth, and advanced to the second part of duodenum.                            The upper GI endoscopy was somewhat difficult due                            to the patient's agitation. Successful completion                            of the procedure was aided by increasing the dose                            of sedation medication. The patient tolerated the                            procedure fairly well. Scope In: 9:07:33 AM Scope Out: 9:13:49 AM Total Procedure Duration: 0 hours 6 minutes 16 seconds  Findings:      LA Grade A (one or more mucosal breaks less than 5 mm, not extending       between tops of 2 mucosal folds) esophagitis with no bleeding was found.      Scattered mild inflammation characterized by congestion (edema),       erosions and erythema was found in the stomach. Biopsies were taken with       a cold forceps for Helicobacter pylori testing.      A medium-sized hiatal hernia was present.      The examined duodenum was  normal. Impression:               - HEMATEMESIS DUE TO LA Grade A reflux esophagitis.                           - MILD Gastritis.                           - Medium-sized hiatal hernia.                           - NAUSEA/VOMITING AND EPIGASTRIC PAIN DUE TO REFLUX Moderate Sedation:      Moderate (conscious) sedation was administered by the endoscopy nurse       and supervised by the endoscopist. The following parameters were       monitored: oxygen saturation, heart rate, blood pressure, and response       to care. Total physician intraservice time was 19 minutes. Recommendation:           - Low fat diet.                           -  Continue present medications.                           - Await pathology results.                           - Return to my office in 2 months.                           AVOID TRIGGERS FOR REFLUX. Marland Kitchen                           Stop smoking.                           DRINK WATER TO KEEP YOUR URINE LIGHT YELLOW.                           STRICTLY FOLLOW A LOW FAT DIET. MEATS SHOULD BE                            BAKED, BROILED, OR BOILED. Avoid fried foods. SEE                            INFO BELOW.                           CONTINUE PREVACID. TAKE 30 MINUTES PRIOR TO                            BREAKFAST and supper.                           NEXT ENDOSCOPY WITH PROPOFOL                           -  Patient has a contact number available for                            emergencies. The signs and symptoms of potential                            delayed complications were discussed with the                            patient. Return to normal activities tomorrow.                            Written discharge instructions were provided to the                            patient. Procedure Code(s):        --- Professional ---                           [phone], Esophagogastroduodenoscopy, flexible,                            transoral; with biopsy, single or multiple                           99152, Moderate sedation services provided by the                            same physician or other qualified health care                            professional performing the diagnostic or                            therapeutic service that the sedation supports,                            requiring the presence of an independent trained                            observer to assist in the monitoring of the                            patient's level of consciousness and physiological                            status; initial 15 minutes of intraservice time,                            patient age 38 years or  older Diagnosis Code(s):        --- Professional ---                           K21.0, Gastro-esophageal reflux disease with  esophagitis                           K29.70, Gastritis, unspecified, without bleeding                           K44.9, Diaphragmatic hernia without obstruction or                            gangrene                           R10.13, Epigastric pain                           K92.1, Melena (includes Hematochezia) CPT copyright 2016 [employer]. All rights reserved. The codes documented in this report are preliminary and upon coder review may  be revised to meet current compliance requirements. Barney Drain, MD Barney Drain, MD 05/17/2016 6:27:42 PM This report has been signed electronically. Number of Addenda: 0

## 2016-05-17 NOTE — H&P (Signed)
  Primary Care Physician:  No PCP Per Patient Primary Gastroenterologist:  Dr. Oneida Alar  Pre-Procedure History & Physical: HPI:  Jeanne Mcneil is a 30 y.o. female here for HEMATEMESIS.  Past Medical History  Diagnosis Date  . Hypercalcuria   . Glaucoma   . Fibroid uterus     fibroid lymphoma  . Gastritis     Past Surgical History  Procedure Laterality Date  . Kidney stents      Prior to Admission medications   Medication Sig Start Date End Date Taking? Authorizing Provider  lansoprazole (PREVACID) 30 MG capsule Take 1 capsule (30 mg total) by mouth daily at 12 noon. 05/14/16  Yes Julianne Rice, MD  ketorolac (TORADOL) 10 MG tablet Take 1 tablet (10 mg total) by mouth every 6 (six) hours as needed (Do not take Ibuprofen with this medication.). 05/06/16   Virginia Rochester, NP    Allergies as of 05/16/2016 - Review Complete 05/16/2016  Allergen Reaction Noted  . Codeine Hives and Itching 05/17/2013  . Percocet [oxycodone-acetaminophen] Hives and Itching 09/16/2012  . Vicodin [hydrocodone-acetaminophen] Hives and Itching 09/16/2012    Family History  Problem Relation Age of Onset  . Colon cancer Paternal Uncle     Passed away 2015/01/18    Social History   Social History  . Marital Status: Single    Spouse Name: N/A  . Number of Children: N/A  . Years of Education: N/A   Occupational History  . Not on file.   Social History Main Topics  . Smoking status: Current Every Day Smoker -- 0.20 packs/day    Types: Cigarettes  . Smokeless tobacco: Never Used     Comment: 3-4 cigarettes daily  . Alcohol Use: 2.4 oz/week    4 Shots of liquor per week     Comment: occasionally, once every 2-3 months.  . Drug Use: No  . Sexual Activity: Yes    Birth Control/ Protection: Condom   Other Topics Concern  . Not on file   Social History Narrative    Review of Systems: See HPI, otherwise negative ROS   Physical Exam: BP 126/78 mmHg  Pulse 70  Temp(Src) 97.8 F (36.6 C)  (Oral)  Resp 16  Ht 5' 2"  (1.575 m)  Wt 150 lb (68.04 kg)  BMI 27.43 kg/m2  SpO2 95%  LMP 05/03/2016 (Exact Date) General:   Alert,  pleasant and cooperative in NAD Head:  Normocephalic and atraumatic. Neck:  Supple; Lungs:  Clear throughout to auscultation.    Heart:  Regular rate and rhythm. Abdomen:  Soft, nontender and nondistended. Normal bowel sounds, without guarding, and without rebound.   Neurologic:  Alert and  oriented x4;  grossly normal neurologically.  Impression/Plan:     HEMATEMESIS  PLAN:  EGD TODAY

## 2016-05-17 NOTE — Discharge Instructions (Signed)
YOUR UPPER ABDOMINAL PAIN, VOMITING, & NAUSEA ARE DUE TO gastritis & REFLUX. The blood in your vomit and black tarry stools are due to erosive reflux esophagitis. You have a hiatal hernia. I biopsied your stomach.   AVOID TRIGGERS FOR REFLUX. SEE INFO BELOW.  Stop smoking.  DRINK WATER TO KEEP YOUR URINE LIGHT YELLOW.  STRICTLY FOLLOW A LOW FAT DIET. MEATS SHOULD BE BAKED, BROILED, OR BOILED. Avoid fried foods. SEE INFO BELOW.  CONTINUE PREVACID. TAKE 30 MINUTES PRIOR TO BREAKFAST and supper.  YOUR BIOPSY RESULTS WILL BE AVAILABLE IN MY CHART JUL 1 AND MY OFFICE WILL CONTACT YOU IN 10-14 DAYS WITH YOUR RESULTS.   FOLLOW UP IN SEP 2017.   UPPER ENDOSCOPY AFTER CARE Read the instructions outlined below and refer to this sheet in the next week. These discharge instructions provide you with general information on caring for yourself after you leave the hospital. While your treatment has been planned according to the most current medical practices available, unavoidable complications occasionally occur. If you have any problems or questions after discharge, call DR. Isaiahs Chancy, [phone].  ACTIVITY  You may resume your regular activity, but move at a slower pace for the next 24 hours.   Take frequent rest periods for the next 24 hours.   Walking will help get rid of the air and reduce the bloated feeling in your belly (abdomen).   No driving for 24 hours (because of the medicine (anesthesia) used during the test).   You may shower.   Do not sign any important legal documents or operate any machinery for 24 hours (because of the anesthesia used during the test).    NUTRITION  Drink plenty of fluids.   You may resume your normal diet as instructed by your doctor.   Begin with a light meal and progress to your normal diet. Heavy or fried foods are harder to digest and may make you feel sick to your stomach (nauseated).   Avoid alcoholic beverages for 24 hours or as instructed.     MEDICATIONS  You may resume your normal medications.   WHAT YOU CAN EXPECT TODAY  Some feelings of bloating in the abdomen.   Passage of more gas than usual.    IF YOU HAD A BIOPSY TAKEN DURING THE UPPER ENDOSCOPY:  Eat a soft diet IF YOU HAVE NAUSEA, BLOATING, ABDOMINAL PAIN, OR VOMITING.    FINDING OUT THE RESULTS OF YOUR TEST Not all test results are available during your visit. DR. Oneida Alar WILL CALL YOU WITHIN 14 DAYS OF YOUR PROCEDUE WITH YOUR RESULTS. Do not assume everything is normal if you have not heard from DR. Tristen Luce, CALL HER OFFICE AT [phone].  SEEK IMMEDIATE MEDICAL ATTENTION AND CALL THE OFFICE: [phone] IF:  You have more than a spotting of blood in your stool.   Your belly is swollen (abdominal distention).   You are nauseated or vomiting.   You have a temperature over 101F.   You have abdominal pain or discomfort that is severe or gets worse throughout the day.   Gastritis  Gastritis is an inflammation (the body's way of reacting to injury and/or infection) of the stomach. It is often caused by bacterial (germ) infections. It can also be caused BY ASPIRIN, BC/GOODY POWDER'S, (IBUPROFEN) MOTRIN, OR ALEVE (NAPROXEN), chemicals (including alcohol), SPICY FOODS, and medications. This illness may be associated with generalized malaise (feeling tired, not well), UPPER ABDOMINAL STOMACH cramps, and fever. One common bacterial cause of gastritis is an organism  known as H. Pylori. This can be treated with antibiotics.   REFLUX/EROSIVE ESOPHAGITIS   TREATMENT There are a number of medicines used to treat reflux including: Antacids.  ZANTAC Proton-pump inhibitors: PREVACID  HOME CARE INSTRUCTIONS Eat 2-3 hours before going to bed.  Try to reach and maintain a healthy weight.  Do not eat just a few very large meals. Instead, eat 4 TO 6 smaller meals throughout the day.  Try to identify foods and beverages that make your symptoms worse, and avoid  these.  Avoid tight clothing.  Do not exercise right after eating.   Low-Fat Diet BREADS, CEREALS, PASTA, RICE, DRIED PEAS, AND BEANS These products are high in carbohydrates and most are low in fat. Therefore, they can be increased in the diet as substitutes for fatty foods. They too, however, contain calories and should not be eaten in excess. Cereals can be eaten for snacks as well as for breakfast.  Include foods that contain fiber (fruits, vegetables, whole grains, and legumes). Research shows that fiber may lower blood cholesterol levels, especially the water-soluble fiber found in fruits, vegetables, oat products, and legumes. FRUITS AND VEGETABLES It is good to eat fruits and vegetables. Besides being sources of fiber, both are rich in vitamins and some minerals. They help you get the daily allowances of these nutrients. Fruits and vegetables can be used for snacks and desserts. MEATS Limit lean meat, chicken, [place], and fish to no more than 6 ounces per day. Beef, Pork, and Lamb Use lean cuts of beef, pork, and lamb. Lean cuts include:  Extra-lean ground beef.  Arm roast.  Sirloin tip.  Center-cut ham.  Round steak.  Loin chops.  Rump roast.  Tenderloin.  Trim all fat off the outside of meats before cooking. It is not necessary to severely decrease the intake of red meat, but lean choices should be made. Lean meat is rich in protein and contains a highly absorbable form of iron. Premenopausal women, in particular, should avoid reducing lean red meat because this could increase the risk for low red blood cells (iron-deficiency anemia).  Chicken and [place] These are good sources of protein. The fat of poultry can be reduced by removing the skin and underlying fat layers before cooking. Chicken and [place] can be substituted for lean red meat in the diet. Poultry should not be fried or covered with high-fat sauces. Fish and Shellfish Fish is a good source of protein. Shellfish  contain cholesterol, but they usually are low in saturated fatty acids. The preparation of fish is important. Like chicken and [place], they should not be fried or covered with high-fat sauces. EGGS Egg whites contain no fat or cholesterol. They can be eaten often. Try 1 to 2 egg whites instead of whole eggs in recipes or use egg substitutes that do not contain yolk.  MILK AND DAIRY PRODUCTS Use skim or 1% milk instead of 2% or whole milk. Decrease whole milk, natural, and processed cheeses. Use nonfat or low-fat (2%) cottage cheese or low-fat cheeses made from vegetable oils. Choose nonfat or low-fat (1 to 2%) yogurt. Experiment with evaporated skim milk in recipes that call for heavy cream. Substitute low-fat yogurt or low-fat cottage cheese for sour cream in dips and salad dressings. Have at least 2 servings of low-fat dairy products, such as 2 glasses of skim (or 1%) milk each day to help get your daily calcium intake.  FATS AND OILS Butterfat, lard, and beef fats are high in saturated fat and  cholesterol. These should be avoided.Vegetable fats do not contain cholesterol. AVOID coconut oil, palm oil, and palm kernel oil, WHICH are very high in saturated fats. These should be limited. These fats are often used in bakery goods, processed foods, popcorn, oils, and nondairy creamers. Vegetable shortenings and some peanut butters contain hydrogenated oils, which are also saturated fats. Read the labels on these foods and check for saturated vegetable oils.  Desirable liquid vegetable oils are corn oil, cottonseed oil, olive oil, canola oil, safflower oil, soybean oil, and sunflower oil. Peanut oil is not as good, but small amounts are acceptable. Buy a heart-healthy tub margarine that has no partially hydrogenated oils in the ingredients. AVOID Mayonnaise and salad dressings often are made from unsaturated fats.  OTHER EATING TIPS Snacks  Most sweets should be limited as snacks. They tend to be rich in  calories and fats, and their caloric content outweighs their nutritional value. Some good choices in snacks are graham crackers, melba toast, soda crackers, bagels (no egg), English muffins, fruits, and vegetables. These snacks are preferable to snack crackers, [place] fries, and chips. Popcorn should be air-popped or cooked in small amounts of liquid vegetable oil.  Desserts Eat fruit, low-fat yogurt, and fruit ices instead of pastries, cake, and cookies. Sherbet, angel food cake, gelatin dessert, frozen low-fat yogurt, or other frozen products that do not contain saturated fat (pure fruit juice bars, frozen ice pops) are also acceptable.   COOKING METHODS Choose those methods that use little or no fat. They include: Poaching.  Braising.  Steaming.  Grilling.  Baking.  Stir-frying.  Broiling.  Microwaving.  Foods can be cooked in a nonstick pan without added fat, or use a nonfat cooking spray in regular cookware. Limit fried foods and avoid frying in saturated fat. Add moisture to lean meats by using water, broth, cooking wines, and other nonfat or low-fat sauces along with the cooking methods mentioned above. Soups and stews should be chilled after cooking. The fat that forms on top after a few hours in the refrigerator should be skimmed off. When preparing meals, avoid using excess salt. Salt can contribute to raising blood pressure in some people.  EATING AWAY FROM HOME Order entres, potatoes, and vegetables without sauces or butter. When meat exceeds the size of a deck of cards (3 to 4 ounces), the rest can be taken home for another meal. Choose vegetable or fruit salads and ask for low-calorie salad dressings to be served on the side. Use dressings sparingly. Limit high-fat toppings, such as bacon, crumbled eggs, cheese, sunflower seeds, and olives. Ask for heart-healthy tub margarine instead of butter.  Hiatal hernia SYMPTOMS Common symptoms of GERD are heartburn (burning in your  chest). This is worse when lying down or bending over. It may also cause nausea, vomiting, belching and indigestion. Some of the things which make GERD worse are:  Increased weight pushes on stomach making acid rise more easily.   Smoking markedly increases acid production.   Alcohol decreases lower esophageal sphincter pressure (valve between stomach and esophagus), allowing acid from stomach into esophagus.   Late evening meals and going to bed with a full stomach increases pressure.    HOME CARE INSTRUCTIONS  Try to achieve and maintain an ideal body weight.   Avoid drinking alcoholic beverages.   DO NOT smokE.   Do not wear tight clothing around your chest or stomach.   Eat smaller meals and eat more frequently. This keeps your stomach from getting too  full. Eat slowly.   Do not lie down for 2 or 3 hours after eating. Do not eat or drink anything 1 to 2 hours before going to bed.   Avoid caffeine beverages (colas, coffee, cocoa, tea), fatty foods, citrus fruits and all other foods and drinks that contain acid and that seem to increase the problems.   Avoid bending over, especially after eating OR STRAINING. Anything that increases the pressure in your belly increases the amount of acid that may be pushed up into your esophagus.

## 2016-05-22 ENCOUNTER — Encounter (HOSPITAL_COMMUNITY): Payer: Self-pay | Admitting: Gastroenterology

## 2016-06-08 ENCOUNTER — Telehealth: Payer: Self-pay | Admitting: Gastroenterology

## 2016-06-08 NOTE — Telephone Encounter (Signed)
I called pt and she is aware of the results.

## 2016-06-08 NOTE — Telephone Encounter (Signed)
Please call pt. Jeanne Mcneil stomach Bx shows gastritis. Jeanne Mcneil UPPER ABDOMINAL PAIN, VOMITING, & NAUSEA ARE DUE TO gastritis & REFLUX.    AVOID TRIGGERS FOR REFLUX. Marland Kitchen  Stop smoking.  DRINK WATER TO KEEP YOUR URINE LIGHT YELLOW.  STRICTLY FOLLOW A LOW FAT DIET. MEATS SHOULD BE BAKED, BROILED, OR BOILED. Avoid fried foods.   CONTINUE PREVACID. TAKE 30 MINUTES PRIOR TO BREAKFAST and supper.  FOLLOW UP IN SEP 2017 E30 GASTRITIS/NAUSEA/VOMITING.

## 2016-06-08 NOTE — Telephone Encounter (Signed)
Tried to call pt, neither phone working and mailed a letter to call.

## 2016-06-08 NOTE — Telephone Encounter (Signed)
[phone]  PATIENT CALLED INQUIRING ABOUT BIOPSY RESULTS.  I SENT HER A TEXT FOR THE MYCHART CODE.

## 2016-07-04 ENCOUNTER — Emergency Department (HOSPITAL_COMMUNITY): Payer: Medicaid Other

## 2016-07-04 ENCOUNTER — Inpatient Hospital Stay (HOSPITAL_COMMUNITY)
Admission: EM | Admit: 2016-07-04 | Discharge: 2016-07-07 | DRG: 690 | Disposition: A | Discharge status: Home / Self Care | Payer: Medicaid Other | Attending: Internal Medicine | Admitting: Internal Medicine

## 2016-07-04 ENCOUNTER — Inpatient Hospital Stay (HOSPITAL_COMMUNITY): Payer: Medicaid Other

## 2016-07-04 ENCOUNTER — Inpatient Hospital Stay (HOSPITAL_COMMUNITY): Payer: Medicaid Other | Admitting: Anesthesiology

## 2016-07-04 ENCOUNTER — Encounter (HOSPITAL_COMMUNITY)
Admission: EM | Disposition: A | Discharge status: Home / Self Care | Payer: Self-pay | Source: Home / Self Care | Attending: Internal Medicine

## 2016-07-04 ENCOUNTER — Encounter (HOSPITAL_COMMUNITY): Payer: Self-pay | Admitting: Emergency Medicine

## 2016-07-04 DIAGNOSIS — H409 Unspecified glaucoma: Secondary | ICD-10-CM | POA: Diagnosis present

## 2016-07-04 DIAGNOSIS — F17210 Nicotine dependence, cigarettes, uncomplicated: Secondary | ICD-10-CM | POA: Diagnosis present

## 2016-07-04 DIAGNOSIS — N201 Calculus of ureter: Secondary | ICD-10-CM | POA: Diagnosis not present

## 2016-07-04 DIAGNOSIS — N390 Urinary tract infection, site not specified: Secondary | ICD-10-CM | POA: Diagnosis not present

## 2016-07-04 DIAGNOSIS — Z800 Family history of malignant neoplasm of digestive organs: Secondary | ICD-10-CM | POA: Diagnosis not present

## 2016-07-04 DIAGNOSIS — K5900 Constipation, unspecified: Secondary | ICD-10-CM | POA: Diagnosis present

## 2016-07-04 DIAGNOSIS — R1031 Right lower quadrant pain: Secondary | ICD-10-CM | POA: Diagnosis present

## 2016-07-04 DIAGNOSIS — N136 Pyonephrosis: Principal | ICD-10-CM | POA: Diagnosis present

## 2016-07-04 DIAGNOSIS — K219 Gastro-esophageal reflux disease without esophagitis: Secondary | ICD-10-CM | POA: Diagnosis present

## 2016-07-04 DIAGNOSIS — R319 Hematuria, unspecified: Secondary | ICD-10-CM | POA: Diagnosis not present

## 2016-07-04 DIAGNOSIS — N029 Recurrent and persistent hematuria with unspecified morphologic changes: Secondary | ICD-10-CM | POA: Diagnosis present

## 2016-07-04 DIAGNOSIS — D500 Iron deficiency anemia secondary to blood loss (chronic): Secondary | ICD-10-CM | POA: Diagnosis not present

## 2016-07-04 DIAGNOSIS — N1330 Unspecified hydronephrosis: Secondary | ICD-10-CM | POA: Diagnosis not present

## 2016-07-04 DIAGNOSIS — F419 Anxiety disorder, unspecified: Secondary | ICD-10-CM | POA: Diagnosis present

## 2016-07-04 DIAGNOSIS — R109 Unspecified abdominal pain: Secondary | ICD-10-CM

## 2016-07-04 DIAGNOSIS — D72829 Elevated white blood cell count, unspecified: Secondary | ICD-10-CM

## 2016-07-04 DIAGNOSIS — N132 Hydronephrosis with renal and ureteral calculous obstruction: Secondary | ICD-10-CM | POA: Diagnosis not present

## 2016-07-04 DIAGNOSIS — E876 Hypokalemia: Secondary | ICD-10-CM | POA: Diagnosis present

## 2016-07-04 HISTORY — PX: CYSTOSCOPY W/ URETERAL STENT PLACEMENT: SHX1429

## 2016-07-04 LAB — CBC WITH DIFFERENTIAL/PLATELET
BASOS ABS: 0.1 10*3/uL (ref 0.0–0.1)
BASOS PCT: 0 %
Eosinophils Absolute: 0.2 10*3/uL (ref 0.0–0.7)
Eosinophils Relative: 1 %
HEMATOCRIT: 42.1 % (ref 36.0–46.0)
Hemoglobin: 14 g/dL (ref 12.0–15.0)
LYMPHS PCT: 20 %
Lymphs Abs: 4.5 10*3/uL — ABNORMAL HIGH (ref 0.7–4.0)
MCH: 30.3 pg (ref 26.0–34.0)
MCHC: 33.3 g/dL (ref 30.0–36.0)
MCV: 91.1 fL (ref 78.0–100.0)
MONO ABS: 1.7 10*3/uL — AB (ref 0.1–1.0)
Monocytes Relative: 8 %
NEUTROS ABS: 15.6 10*3/uL — AB (ref 1.7–7.7)
Neutrophils Relative %: 71 %
PLATELETS: 354 10*3/uL (ref 150–400)
RBC: 4.62 MIL/uL (ref 3.87–5.11)
RDW: 14.2 % (ref 11.5–15.5)
WBC: 22 10*3/uL — AB (ref 4.0–10.5)

## 2016-07-04 LAB — COMPREHENSIVE METABOLIC PANEL
ALBUMIN: 4.3 g/dL (ref 3.5–5.0)
ALT: 24 U/L (ref 14–54)
AST: 22 U/L (ref 15–41)
Alkaline Phosphatase: 60 U/L (ref 38–126)
Anion gap: 12 (ref 5–15)
BUN: 9 mg/dL (ref 6–20)
CHLORIDE: 108 mmol/L (ref 101–111)
CO2: 19 mmol/L — ABNORMAL LOW (ref 22–32)
CREATININE: 0.86 mg/dL (ref 0.44–1.00)
Calcium: 9.2 mg/dL (ref 8.9–10.3)
GFR calc Af Amer: >60 mL/min (ref >60)
GFR calc non Af Amer: >60 mL/min (ref >60)
GLUCOSE: 146 mg/dL — AB (ref 65–99)
POTASSIUM: 3.4 mmol/L — AB (ref 3.5–5.1)
Sodium: 139 mmol/L (ref 135–145)
Total Bilirubin: 0.4 mg/dL (ref 0.3–1.2)
Total Protein: 7.4 g/dL (ref 6.5–8.1)

## 2016-07-04 LAB — URINALYSIS, ROUTINE W REFLEX MICROSCOPIC
Bilirubin Urine: NEGATIVE
GLUCOSE, UA: 100 mg/dL — AB
Ketones, ur: 15 mg/dL — AB
Nitrite: POSITIVE — AB
PH: 6.5 (ref 5.0–8.0)
Protein, ur: >300 mg/dL — AB
SPECIFIC GRAVITY, URINE: 1.025 (ref 1.005–1.030)

## 2016-07-04 LAB — URINE MICROSCOPIC-ADD ON: Squamous Epithelial / LPF: NONE SEEN

## 2016-07-04 LAB — LIPASE, BLOOD: Lipase: 21 U/L (ref 11–51)

## 2016-07-04 LAB — POC OCCULT BLOOD, ED: FECAL OCCULT BLD: NEGATIVE

## 2016-07-04 LAB — PREGNANCY, URINE: Preg Test, Ur: NEGATIVE

## 2016-07-04 SURGERY — CYSTOSCOPY, WITH RETROGRADE PYELOGRAM AND URETERAL STENT INSERTION
Anesthesia: General | Laterality: Right

## 2016-07-04 MED ORDER — SUCCINYLCHOLINE 20MG/ML (10ML) SYRINGE FOR MEDFUSION PUMP - OPTIME
INTRAMUSCULAR | Status: DC | PRN
  Administered 2016-07-04: 120 mg via INTRAVENOUS

## 2016-07-04 MED ORDER — ACETAMINOPHEN 650 MG RE SUPP: 650.0000 mg | Freq: Four times a day (QID) | RECTAL | Status: DC | PRN

## 2016-07-04 MED ORDER — FENTANYL CITRATE (PF) 250 MCG/5ML IJ SOLN
INTRAMUSCULAR | Status: AC
  Filled 2016-07-04: qty 5

## 2016-07-04 MED ORDER — ROCURONIUM 10MG/ML (10ML) SYRINGE FOR MEDFUSION PUMP - OPTIME
INTRAVENOUS | Status: DC | PRN
  Administered 2016-07-04: 5 mg via INTRAVENOUS

## 2016-07-04 MED ORDER — LIDOCAINE HCL (CARDIAC) 10 MG/ML IV SOLN
INTRAVENOUS | Status: DC | PRN
  Administered 2016-07-04: 50 mg via INTRAVENOUS

## 2016-07-04 MED ORDER — LACTATED RINGERS IV SOLN
INTRAVENOUS | Status: DC | PRN
  Administered 2016-07-04: 17:00:00 via INTRAVENOUS

## 2016-07-04 MED ORDER — ONDANSETRON HCL 4 MG/2ML IJ SOLN
4.0000 mg | Freq: Once | INTRAMUSCULAR | Status: AC
  Administered 2016-07-04: 4 mg via INTRAVENOUS
  Filled 2016-07-04: qty 2

## 2016-07-04 MED ORDER — SODIUM CHLORIDE 0.9 % IV BOLUS (SEPSIS)
1000.0000 mL | Freq: Once | INTRAVENOUS | Status: AC
  Administered 2016-07-04: 1000 mL via INTRAVENOUS

## 2016-07-04 MED ORDER — FENTANYL CITRATE (PF) 100 MCG/2ML IJ SOLN
INTRAMUSCULAR | Status: DC | PRN
  Administered 2016-07-04 (×4): 50 ug via INTRAVENOUS

## 2016-07-04 MED ORDER — ONDANSETRON HCL 4 MG/2ML IJ SOLN
INTRAMUSCULAR | Status: DC | PRN
  Administered 2016-07-04: 4 mg via INTRAVENOUS

## 2016-07-04 MED ORDER — ROCURONIUM BROMIDE 50 MG/5ML IV SOLN
INTRAVENOUS | Status: AC
  Filled 2016-07-04: qty 1

## 2016-07-04 MED ORDER — MIDAZOLAM HCL 2 MG/2ML IJ SOLN
INTRAMUSCULAR | Status: AC
  Filled 2016-07-04: qty 2

## 2016-07-04 MED ORDER — PROPOFOL 10 MG/ML IV BOLUS
INTRAVENOUS | Status: AC
Start: 2016-07-04 — End: 2016-07-04
  Filled 2016-07-04: qty 20

## 2016-07-04 MED ORDER — MIDAZOLAM HCL 5 MG/5ML IJ SOLN
INTRAMUSCULAR | Status: DC | PRN
  Administered 2016-07-04 (×2): 2 mg via INTRAVENOUS

## 2016-07-04 MED ORDER — SODIUM CHLORIDE 0.9 % IV SOLN
INTRAVENOUS | Status: AC
  Administered 2016-07-04: 12:00:00 via INTRAVENOUS

## 2016-07-04 MED ORDER — ONDANSETRON HCL 4 MG/2ML IJ SOLN
4.0000 mg | Freq: Four times a day (QID) | INTRAMUSCULAR | Status: DC | PRN
  Administered 2016-07-04 – 2016-07-07 (×5): 4 mg via INTRAVENOUS
  Filled 2016-07-04 (×6): qty 2

## 2016-07-04 MED ORDER — SUCCINYLCHOLINE CHLORIDE 20 MG/ML IJ SOLN
INTRAMUSCULAR | Status: AC
  Filled 2016-07-04: qty 1

## 2016-07-04 MED ORDER — PROCHLORPERAZINE EDISYLATE 5 MG/ML IJ SOLN
5.0000 mg | Freq: Four times a day (QID) | INTRAMUSCULAR | Status: AC | PRN
  Administered 2016-07-04 – 2016-07-05 (×2): 5 mg via INTRAVENOUS
  Filled 2016-07-04 (×2): qty 2

## 2016-07-04 MED ORDER — LORAZEPAM 2 MG/ML IJ SOLN
0.5000 mg | Freq: Once | INTRAMUSCULAR | Status: AC
  Administered 2016-07-04: 0.5 mg via INTRAVENOUS
  Filled 2016-07-04: qty 1

## 2016-07-04 MED ORDER — EPHEDRINE SULFATE 50 MG/ML IJ SOLN
INTRAMUSCULAR | Status: AC
  Filled 2016-07-04: qty 1

## 2016-07-04 MED ORDER — HYDROMORPHONE HCL 1 MG/ML IJ SOLN
1.0000 mg | INTRAMUSCULAR | Status: DC | PRN
  Administered 2016-07-04 – 2016-07-06 (×9): 1 mg via INTRAVENOUS
  Filled 2016-07-04 (×9): qty 1

## 2016-07-04 MED ORDER — DEXTROSE 5 % IV SOLN
1.0000 g | Freq: Once | INTRAVENOUS | Status: AC
  Administered 2016-07-04: 1 g via INTRAVENOUS
  Filled 2016-07-04: qty 10

## 2016-07-04 MED ORDER — LORAZEPAM 2 MG/ML IJ SOLN
1.0000 mg | Freq: Once | INTRAMUSCULAR | Status: AC
  Administered 2016-07-04: 1 mg via INTRAVENOUS
  Filled 2016-07-04: qty 1

## 2016-07-04 MED ORDER — PROPOFOL 10 MG/ML IV BOLUS
INTRAVENOUS | Status: DC | PRN
  Administered 2016-07-04: 170 mg via INTRAVENOUS

## 2016-07-04 MED ORDER — LIDOCAINE HCL (PF) 1 % IJ SOLN
INTRAMUSCULAR | Status: AC
  Filled 2016-07-04: qty 5

## 2016-07-04 MED ORDER — CEFTRIAXONE SODIUM 1 G IJ SOLR
1.0000 g | INTRAMUSCULAR | Status: DC
  Administered 2016-07-05 – 2016-07-07 (×3): 1 g via INTRAVENOUS
  Filled 2016-07-04 (×6): qty 10

## 2016-07-04 MED ORDER — LORAZEPAM 2 MG/ML IJ SOLN
0.5000 mg | INTRAMUSCULAR | Status: AC | PRN
  Administered 2016-07-04 – 2016-07-05 (×2): 0.5 mg via INTRAVENOUS
  Filled 2016-07-04 (×2): qty 1

## 2016-07-04 MED ORDER — IOPAMIDOL (ISOVUE-370) INJECTION 76%
INTRAVENOUS | Status: DC | PRN
Start: 2016-07-04 — End: 2016-07-04
  Administered 2016-07-04: 100 mL

## 2016-07-04 MED ORDER — SODIUM CHLORIDE 0.9 % IJ SOLN
INTRAMUSCULAR | Status: AC
  Filled 2016-07-04: qty 10

## 2016-07-04 MED ORDER — FAMOTIDINE IN NACL 20-0.9 MG/50ML-% IV SOLN
20.0000 mg | Freq: Two times a day (BID) | INTRAVENOUS | Status: DC
  Administered 2016-07-04 – 2016-07-06 (×5): 20 mg via INTRAVENOUS
  Filled 2016-07-04 (×5): qty 50

## 2016-07-04 MED ORDER — DIATRIZOATE MEGLUMINE 30 % UR SOLN
URETHRAL | Status: AC
  Filled 2016-07-04: qty 300

## 2016-07-04 MED ORDER — HYDROMORPHONE HCL 1 MG/ML IJ SOLN
1.0000 mg | Freq: Once | INTRAMUSCULAR | Status: AC
Start: 2016-07-04 — End: 2016-07-04
  Administered 2016-07-04: 1 mg via INTRAVENOUS
  Filled 2016-07-04: qty 1

## 2016-07-04 MED ORDER — SODIUM CHLORIDE 0.9 % IV SOLN
INTRAVENOUS | Status: DC
  Administered 2016-07-05 – 2016-07-06 (×5): via INTRAVENOUS

## 2016-07-04 MED ORDER — ACETAMINOPHEN 325 MG PO TABS: 650.0000 mg | ORAL_TABLET | Freq: Four times a day (QID) | ORAL | Status: DC | PRN

## 2016-07-04 MED ORDER — WATER FOR IRRIGATION, STERILE IR SOLN
Status: DC | PRN
  Administered 2016-07-04: 3000 mL

## 2016-07-04 SURGICAL SUPPLY — 19 items
BAG DRAIN URO TABLE W/ADPT NS (DRAPE) ×3 IMPLANT
BAG HAMPER (MISCELLANEOUS) ×3 IMPLANT
CATH URET 5FR 28IN OPEN ENDED (CATHETERS) ×3 IMPLANT
CLOTH BEACON ORANGE TIMEOUT ST (SAFETY) ×3 IMPLANT
DECANTER SPIKE VIAL GLASS SM (MISCELLANEOUS) ×3 IMPLANT
DRSG TEGADERM 2-3/8X2-3/4 SM (GAUZE/BANDAGES/DRESSINGS) IMPLANT
GLOVE BIO SURGEON STRL SZ7.5 (GLOVE) ×3 IMPLANT
GLOVE BIOGEL PI IND STRL 6.5 (GLOVE) ×2 IMPLANT
GLOVE BIOGEL PI INDICATOR 6.5 (GLOVE) ×4
GOWN STRL REIN XL XLG (GOWN DISPOSABLE) ×3 IMPLANT
GOWN STRL REUS W/TWL LRG LVL3 (GOWN DISPOSABLE) ×3 IMPLANT
GUIDEWIRE STR DUAL SENSOR (WIRE) ×3 IMPLANT
IV NS IRRIG 3000ML ARTHROMATIC (IV SOLUTION) IMPLANT
KIT ROOM TURNOVER AP CYSTO (KITS) ×3 IMPLANT
PACK CYSTO (CUSTOM PROCEDURE TRAY) ×3 IMPLANT
PAD ARMBOARD 7.5X6 YLW CONV (MISCELLANEOUS) ×3 IMPLANT
STENT URET 6FRX24 CONTOUR (STENTS) ×3 IMPLANT
WATER STERILE IRR 1000ML UROMA (IV SOLUTION) ×3 IMPLANT
WATER STERILE IRR 3000ML UROMA (IV SOLUTION) ×3 IMPLANT

## 2016-07-04 NOTE — Consult Note (Signed)
Patient In the emergency department with poorly controlled pain and is more millimeter stone at the right UPJ.  She has positive nitrites in her urine and a white blood cell count of 22,000.  The patient needs a ureteral stent placed and needs to be placed on antibiotics prior to manipulating her stone.  I've spoken to the emergency room physician in this regard.  Our plan currently is that the patient admitted to the hospitalist service with tentative trip to the operating room this afternoon for right ureteral stent placement.

## 2016-07-04 NOTE — Anesthesia Procedure Notes (Signed)
Procedure Name: Intubation Date/Time: 07/04/2016 5:57 PM Performed by: Tressie Stalker E Pre-anesthesia Checklist: Patient identified, Patient being monitored, Timeout performed, Emergency Drugs available and Suction available Patient Re-evaluated:Patient Re-evaluated prior to inductionOxygen Delivery Method: Circle system utilized Preoxygenation: Pre-oxygenation with 100% oxygen Intubation Type: IV induction and Cricoid Pressure applied Ventilation: Mask ventilation without difficulty Laryngoscope Size: Mac and 3 Grade View: Grade I Tube type: Oral Tube size: 7.0 mm Number of attempts: 1 Airway Equipment and Method: Stylet Placement Confirmation: ETT inserted through vocal cords under direct vision,  positive ETCO2 and breath sounds checked- equal and bilateral Secured at: 21 cm Tube secured with: Tape Dental Injury: Teeth and Oropharynx as per pre-operative assessment

## 2016-07-04 NOTE — ED Triage Notes (Signed)
Pt c/o abd pain with n/v.

## 2016-07-04 NOTE — Discharge Instructions (Signed)
DISCHARGE INSTRUCTIONS FOR KIDNEY STONE/URETERAL STENT   MEDICATIONS:  1.  Resume all your other meds from home - except do not take any extra narcotic pain meds that you may have at home.  2. Take all prescribed medications as directed.   ACTIVITY:  1. No strenuous activity x 1week  2. No driving while on narcotic pain medications  3. Drink plenty of water  4. Continue to walk at home - you can still get blood clots when you are at home, so keep active, but don't over do it.  5. May return to work/school tomorrow or when you feel ready   BATHING:  1. You can shower and we recommend daily showers    SIGNS/SYMPTOMS TO CALL:  Please call us if you have a fever greater than 101.5, uncontrolled nausea/vomiting, uncontrolled pain, dizziness, unable to urinate, bloody urine, chest pain, shortness of breath, leg swelling, leg pain, redness around wound, drainage from wound, or any other concerns or questions.   You can reach [place] at [phone].   FOLLOW-UP:  1. You will be scheduled for follow-up with [facility] in [facility] in 1-2 weeks.

## 2016-07-04 NOTE — Anesthesia Postprocedure Evaluation (Signed)
Anesthesia Post Note  Patient: Jeanne Mcneil  Procedure(s) Performed: Procedure(s) (LRB): CYSTOSCOPY WITH RIGHT RETROGRADE PYELOGRAM, RIGHT URETERAL STENT PLACEMENT (Right)  Patient location during evaluation: PACU Anesthesia Type: General Level of consciousness: awake and alert and oriented Pain management: pain level controlled Vital Signs Assessment: post-procedure vital signs reviewed and stable Respiratory status: spontaneous breathing Cardiovascular status: blood pressure returned to baseline Postop Assessment: adequate PO intake and no signs of nausea or vomiting Anesthetic complications: no    Last Vitals:  Vitals:   07/04/16 1830 07/04/16 1845  BP: (!) 140/91 117/82  Pulse: (!) 110 97  Resp: (!) 22 13  Temp:      Last Pain:  Vitals:   07/04/16 1829  TempSrc:   PainSc: 0-No pain                 Angelle Isais

## 2016-07-04 NOTE — Consult Note (Signed)
I have been asked to see the patient by Dr. Kathie Dike, MD, for evaluation and management of right proximal ureteral stone and infection.  History of present illness: 30 year old female with a history of hypercalciuria and recurrent kidney stones who presented to the emergency department earlier this morning with 12 hours of progressive right-sided flank pain and associated nausea vomiting. The pain was located in the patient's right flank and radiated down to the right groin. He was establishing pain. It was 10/10. The patient denies any associated fevers. She denies any dysuria. In the emergency room the patient was noted to have a urinalysis positive for nitrites and a white blood cell count of 22,000. She was afebrile. A CT scan was performed demonstrating bilateral nonobstructing nephrolithiasis with a 4 mm right proximal ureteral stone and associated hydronephrosis. At that time, decision was made to admit the patient and plan for an urgent ureteral stent placement given the high risk for developing urosepsis. The patient was admitted and placed on IV pain medication as well as antibiotics. Her pain has been reasonably well-controlled. She is remained afebrile.  Review of systems: A 12 point comprehensive review of systems was obtained and is negative unless otherwise stated in the history of present illness.  Patient Active Problem List   Diagnosis Date Noted  . Ureteral stone 07/04/2016  . UTI (urinary tract infection) 07/04/2016  . Hydronephrosis of right kidney 07/04/2016  . GERD (gastroesophageal reflux disease) 07/04/2016  . Ureteral calculi 07/04/2016  . Abdominal pain, chronic, epigastric   . Hematemesis 05/16/2016  . Heme + stool 05/16/2016  . Abdominal pain, epigastric 05/16/2016    No current facility-administered medications on file prior to encounter.    No current outpatient prescriptions on file prior to encounter.    Past Medical History:  Diagnosis Date  .  Fibroid uterus    fibroid lymphoma  . Gastritis   . Glaucoma   . Hypercalcuria     Past Surgical History:  Procedure Laterality Date  . ESOPHAGOGASTRODUODENOSCOPY N/A 05/17/2016   Procedure: ESOPHAGOGASTRODUODENOSCOPY (EGD);  Surgeon: Danie Binder, MD;  Location: [facility];  Service: Endoscopy;  Laterality: N/A;  830   . kidney stents      Social History  Substance Use Topics  . Smoking status: Current Every Day Smoker    Packs/day: 0.20    Types: Cigarettes  . Smokeless tobacco: Never Used     Comment: 3-4 cigarettes daily  . Alcohol use 2.4 oz/week    4 Shots of liquor per week     Comment: occasionally, once every 2-3 months.    Family History  Problem Relation Age of Onset  . Colon cancer Paternal Uncle     Passed away 2015/01/25    PE: Vitals:   07/04/16 0743 07/04/16 1153 07/04/16 1238 07/04/16 1714  BP: 119/72 121/68 (!) 162/94 140/72  Pulse: 72 74 70 65  Resp: 18 18 18 18   Temp: 98.2 F (36.8 C) 98.2 F (36.8 C) 98.4 F (36.9 C) 98 F (36.7 C)  TempSrc: Oral Oral Oral Oral  SpO2: 100% 100% 98% 97%  Weight: 68 kg (150 lb)   68 kg (150 lb)  Height: 5' 4"  (1.626 m)   5' 4"  (1.626 m)   Patient appears to be in no acute distress  patient is alert and oriented x3 Atraumatic normocephalic head No cervical or supraclavicular lymphadenopathy appreciated No increased work of breathing, no audible wheezes/rhonchi Regular sinus rhythm/rate Abdomen is soft, nontender, nondistended, Right  CVA tenderness Lower extremities are symmetric without appreciable edema Grossly neurologically intact No identifiable skin lesions   Recent Labs  07/04/16 0629  WBC 22.0*  HGB 14.0  HCT 42.1    Recent Labs  07/04/16 0629  NA 139  K 3.4*  CL 108  CO2 19*  GLUCOSE 146*  BUN 9  CREATININE 0.86  CALCIUM 9.2   No results for input(s): LABPT, INR in the last 72 hours. No results for input(s): LABURIN in the last 72 hours. Results for orders placed or performed  during the hospital encounter of 05/06/16  Wet prep, genital     Status: Abnormal   Collection Time: 05/06/16 12:46 PM  Result Value Ref Range Status   Yeast Wet Prep HPF POC NONE SEEN NONE SEEN Final   Trich, Wet Prep NONE SEEN NONE SEEN Final   Clue Cells Wet Prep HPF POC NONE SEEN NONE SEEN Final   WBC, Wet Prep HPF POC FEW (A) NONE SEEN Final    Comment: MANY BACTERIA SEEN   Sperm NONE SEEN  Final    Imaging: I've independently reviewed the patient's noncontrast stone protocol CT scan demonstrating bilateral nonobstructing stones with a 4 mm right proximal ureteral stone and hydronephrosis.   Imp/Recommendations:: The patient has a obstructing 4 mm proximal ureteral stone, and elevated white blood cell count, and a urine concerning for infection. Given the high risk for developing urosepsis, decision was made to take the patient urgently to the operating room for ureteral stent placement. I went over this procedure in detail with the patient including the procedure itself, expected outcomes, and the associated risks and benefits. The patient understands that following separation she will need to be placed on by mouth antibiotics for 2 weeks prior to any additional ureteroscopy or stone extraction techniques. The patient then agreed to proceed with the operation.     Louis Meckel W

## 2016-07-04 NOTE — ED Notes (Signed)
Pt yelling out flopping around in bed, sitting on the floor yelling.  Pt told dr will be in when she can.

## 2016-07-04 NOTE — Progress Notes (Signed)
A 24 cm x 6 French double-J ureteral stent was placed under general anesthesia without any complications. Intraoperative findings included severe hydronephrosis of the right kidney with a proximal filling defect consistent with the patient's proximal ureteral stone.  Recommendations are to transition the patient to by mouth antibiotics and pain medication. She should then be treated for 14 days with culture specific antibiotics. I will work on getting her scheduled for follow-up with urology at the [facility].  Discharge instructions and follow-up instructions have been provided under the discharge tab.

## 2016-07-04 NOTE — Anesthesia Preprocedure Evaluation (Addendum)
Anesthesia Evaluation  Patient identified by MRN, date of birth, ID band Patient awake  General Assessment Comment:Patient not on a beta blocker.  Reviewed: Allergy & Precautions, NPO status , Patient's Chart, lab work & pertinent test results  Airway Mallampati: II  TM Distance: >3 FB Neck ROM: Full    Dental  (+) Teeth Intact   Pulmonary Current Smoker,    breath sounds clear to auscultation       Cardiovascular negative cardio ROS   Rhythm:Regular Rate:Normal     Neuro/Psych Anxiety    GI/Hepatic Neg liver ROS, hiatal hernia, GERD  Medicated and Poorly Controlled,  Endo/Other    Renal/GU Renal calculi, mild hydronephrosis, UTI     Musculoskeletal   Abdominal   Peds  Hematology   Anesthesia Other Findings   Reproductive/Obstetrics                           Anesthesia Physical Anesthesia Plan  ASA: II and emergent  Anesthesia Plan: General   Post-op Pain Management:    Induction: Intravenous, Rapid sequence and Cricoid pressure planned  Airway Management Planned: Oral ETT  Additional Equipment:   Intra-op Plan:   Post-operative Plan: Extubation in OR  Informed Consent:   Plan Discussed with: Surgeon  Anesthesia Plan Comments:         Anesthesia Quick Evaluation

## 2016-07-04 NOTE — H&P (Signed)
History and Physical    Jeanne Mcneil DSK:[id number] DOB: 1986-09-07 DOA: 07/04/2016  PCP: No PCP Per Patient  Patient coming from: Home   Chief Complaint: Abdominal pain   HPI: Jeanne Mcneil is a 30 y.o. female presented with complaints of abdominal pain. States she had back pain around 10 PM last night this was in the center of her spine. Pt reports she woke up at 3 AM with stabbing right-sided back which radiated to her RLQ. Ibuprofen is the only thing that helps. Denies pain when urinating. States she has had hematuria for over a month. Associated vomiting and constipation. Reports she couldn't keep anything down. States she has had constipation for over a month. She is unsure of any fever. She has not had  any SOB or cough.     ED Course: WBC 22.0. UA indicative of infection. CT of abdomen/pelvis revealed a 4 millimeter stone at the right UPJ. ER discussed case with urology who recommended admission for ureteral stent placement.    Review of Systems: As per HPI otherwise 10 point review of systems negative.    Past Medical History:  Diagnosis Date  . Fibroid uterus    fibroid lymphoma  . Gastritis   . Glaucoma   . Hypercalcuria     Past Surgical History:  Procedure Laterality Date  . ESOPHAGOGASTRODUODENOSCOPY N/A 05/17/2016   Procedure: ESOPHAGOGASTRODUODENOSCOPY (EGD);  Surgeon: Danie Binder, MD;  Location: [facility];  Service: Endoscopy;  Laterality: N/A;  830   . kidney stents       reports that she has been smoking Cigarettes.  She has been smoking about 0.20 packs per day. She has never used smokeless tobacco. She reports that she drinks about 2.4 oz of alcohol per week . She reports that she does not use drugs.  Allergies  Allergen Reactions  . Codeine Hives and Itching    Able to take Dilaudid and Ultram.  . Percocet [Oxycodone-Acetaminophen] Hives and Itching  . Vicodin [Hydrocodone-Acetaminophen] Hives and Itching    Family History  Problem  Relation Age of Onset  . Colon cancer Paternal Uncle     Passed away 02/08/2015    Prior to Admission medications   Medication Sig Start Date End Date Taking? Authorizing Provider  lansoprazole (PREVACID) 15 MG capsule Take 15 mg by mouth daily at 12 noon.   Yes Historical Provider, MD  traMADol (ULTRAM) 50 MG tablet Take 50 mg by mouth daily.   Yes Historical Provider, MD  ketorolac (TORADOL) 10 MG tablet Take 1 tablet (10 mg total) by mouth every 6 (six) hours as needed (Do not take Ibuprofen with this medication.). Patient not taking: Reported on 07/04/2016 05/06/16   Virginia Rochester, NP  lansoprazole (PREVACID) 30 MG capsule 1 PO 30 MINS PRIOR TO MEALS FOR 3 MOS THEN ONCE DALY Patient not taking: Reported on 07/04/2016 05/17/16   Danie Binder, MD    Physical Exam: Vitals:   07/04/16 0743  BP: 119/72  Pulse: 72  Resp: 18  Temp: 98.2 F (36.8 C)  TempSrc: Oral  SpO2: 100%  Weight: 68 kg (150 lb)  Height: 5' 4"  (1.626 m)      Constitutional: NAD, calm, comfortable Vitals:   07/04/16 0743  BP: 119/72  Pulse: 72  Resp: 18  Temp: 98.2 F (36.8 C)  TempSrc: Oral  SpO2: 100%  Weight: 68 kg (150 lb)  Height: 5' 4"  (1.626 m)   Eyes: PERRL, lids and conjunctivae normal ENMT:  Mucous membranes are moist. Posterior pharynx clear of any exudate or lesions.Normal dentition.  Neck: normal, supple, no masses, no thyromegaly Respiratory: clear to auscultation bilaterally, no wheezing, no crackles. Normal respiratory effort. No accessory muscle use.  Cardiovascular: Regular rate and rhythm, no murmurs / rubs / gallops. No extremity edema. 2+ pedal pulses. No carotid bruits.  Abdomen: Mild tenderness in her RLQ. No masses palpated. No hepatosplenomegaly. Bowel sounds positive.  Musculoskeletal: no clubbing / cyanosis. No joint deformity upper and lower extremities. Good ROM, no contractures. Normal muscle tone.  Skin: no rashes, lesions, ulcers. No induration Neurologic: CN 2-12 grossly  intact. Sensation intact, DTR normal. Strength 5/5 in all 4.  Psychiatric: Normal judgment and insight. Alert and oriented x 3. Normal mood.     Labs on Admission: I have personally reviewed following labs and imaging studies  CBC:  Recent Labs Lab 07/04/16 0629  WBC 22.0*  NEUTROABS 15.6*  HGB 14.0  HCT 42.1  MCV 91.1  PLT 202   Basic Metabolic Panel:  Recent Labs Lab 07/04/16 0629  NA 139  K 3.4*  CL 108  CO2 19*  GLUCOSE 146*  BUN 9  CREATININE 0.86  CALCIUM 9.2   GFR: Estimated Creatinine Clearance: 89 mL/min (by C-G formula based on SCr of 0.86 mg/dL). Liver Function Tests:  Recent Labs Lab 07/04/16 0629  AST 22  ALT 24  ALKPHOS 60  BILITOT 0.4  PROT 7.4  ALBUMIN 4.3    Recent Labs Lab 07/04/16 0629  LIPASE 21   No results for input(s): AMMONIA in the last 168 hours. Coagulation Profile: No results for input(s): INR, PROTIME in the last 168 hours. Cardiac Enzymes: No results for input(s): CKTOTAL, CKMB, CKMBINDEX, TROPONINI in the last 168 hours. BNP (last 3 results) No results for input(s): PROBNP in the last 8760 hours. HbA1C: No results for input(s): HGBA1C in the last 72 hours. CBG: No results for input(s): GLUCAP in the last 168 hours. Lipid Profile: No results for input(s): CHOL, HDL, LDLCALC, TRIG, CHOLHDL, LDLDIRECT in the last 72 hours. Thyroid Function Tests: No results for input(s): TSH, T4TOTAL, FREET4, T3FREE, THYROIDAB in the last 72 hours. Anemia Panel: No results for input(s): VITAMINB12, FOLATE, FERRITIN, TIBC, IRON, RETICCTPCT in the last 72 hours. Urine analysis:    Component Value Date/Time   COLORURINE RED (A) 07/04/2016 0650   APPEARANCEUR TURBID (A) 07/04/2016 0650   LABSPEC 1.025 07/04/2016 0650   PHURINE 6.5 07/04/2016 0650   GLUCOSEU 100 (A) 07/04/2016 0650   HGBUR LARGE (A) 07/04/2016 0650   BILIRUBINUR NEGATIVE 07/04/2016 0650   KETONESUR 15 (A) 07/04/2016 0650   PROTEINUR >300 (A) 07/04/2016 0650    UROBILINOGEN 0.2 07/02/2015 1757   NITRITE POSITIVE (A) 07/04/2016 0650   LEUKOCYTESUR MODERATE (A) 07/04/2016 0650   Sepsis Labs: !!!!!!!!!!!!!!!!!!!!!!!!!!!!!!!!!!!!!!!!!!!! @LABRCNTIP (procalcitonin:4,lacticidven:4) )No results found for this or any previous visit (from the past 240 hour(s)).   Radiological Exams on Admission: Ct Renal Stone Study  Result Date: 07/04/2016 CLINICAL DATA:  Abdominal pain all over with nausea and vomiting for 2 days, smoker EXAM: CT ABDOMEN AND PELVIS WITHOUT CONTRAST TECHNIQUE: Multidetector CT imaging of the abdomen and pelvis was performed following the standard protocol without IV contrast. Sagittal and coronal MPR images reconstructed from axial data set. Oral contrast not administered for this indication COMPARISON:  05/14/2016 FINDINGS: Lower chest:  Lung bases clear Hepatobiliary: Gallbladder and liver normal appearance for technique Pancreas: Normal appearance Spleen: Normal appearance Adrenals/Urinary Tract: Renal glands normal appearance. BILATERAL nonobstructing  renal calculi. RIGHT hydronephrosis secondary to a 4 mm RIGHT UPJ calculus. No ureteral calcification or dilatation. Bladder decompressed. Stomach/Bowel: Normal appendix. Small hiatal hernia. Stomach and bowel loops otherwise normal appearance Vascular/Lymphatic: Few pelvic phleboliths.  No adenopathy. Reproductive: Probable small uterine leiomyoma LEFT lateral, sub serosal, 2.4 x 2.3 cm image 68. Uterus and adnexa otherwise normal appearance Other: No free air or free fluid. No acute inflammatory process or hernia. Musculoskeletal: Osseous structures unremarkable IMPRESSION: BILATERAL nonobstructing renal calculi. Additionally, mild RIGHT hydronephrosis secondary to a 4 mm RIGHT UPJ calculus. Small hiatal hernia. Small uterine leiomyoma. Electronically Signed   By: Lavonia Dana M.D.   On: 07/04/2016 07:46     Assessment/Plan Active Problems:   Ureteral stone  1. Ureteral stone. CT abd/pelvis  revealed mild right hydronephrosis secondary to a 4 mm right UPJ calculus. Urology has been consulted for right ureteral stent placement. Will keep the patient NPO until seen by urology. Continue IVFs and pain management.  2. Right hydronephroses. Likely related to ureteral stone. Anticipate improvement with stent placement.   3. UTI. UA indicative of infection. Afebrile. WBC elevated at  22.0. Start on IV rocephin. Follow-up urine culture.    DVT prophylaxis: SCDs Code Status: Full  Family Communication:  No family bedside Disposition Plan: Discharge home once improved likely in AM  Consults called: Urology  Admission status: Observation    Kathie Dike, MD [facility] If 7PM-7AM, please contact night-coverage [email or web address] Password [email or web address]  07/04/2016, 11:24 AM   By signing my name below, I, Collene Leyden, attest that this documentation has been prepared under the direction and in the presence of Kathie Dike, MD. Electronically signed: Collene Leyden, Scribe. 07/04/16 12:59PM  I, Dr. Kathie Dike, personally performed the services described in this documentaiton. All medical record entries made by the scribe were at my direction and in my presence. I have reviewed the chart and agree that the record reflects my personal performance and is accurate and complete  Kathie Dike, MD, 07/04/2016 1:25 PM

## 2016-07-04 NOTE — ED Notes (Signed)
Report given to Ander Purpura, RN for room 308 (med-surg).

## 2016-07-04 NOTE — ED Provider Notes (Signed)
[facility] DEPT Provider Note   CSN: [id number] Arrival date & time: 07/04/16  0608     History   Chief Complaint Chief Complaint  Patient presents with  . Abdominal Pain    HPI Jeanne Mcneil is a 30 y.o. female.  Patient screaming and yelling for pain medication. States she woke up around 3 AM with pain in the right side of her back and right-sided lower abdomen. States she's vomited "countless times". Denies any blood in her emesis. Denies any blood in her stools. Denies any vaginal leading or discharge. Last menstrual cycle was last week. Denies any possibility of pregnancy. Unknown fever. No chest pain or shortness of breath. Patient with history of uterine fibroids, gastritis and recent endoscopy in June. States this pain is different than her gastritis type pain. She is not tried taking anything for it at home. Denies any alcohol use. Denies any chest pain or shortness of breath.   The history is provided by the patient. The history is limited by the condition of the patient.  Abdominal Pain   Associated symptoms include nausea, vomiting, dysuria and hematuria. Pertinent negatives include fever, diarrhea, headaches and arthralgias.    Past Medical History:  Diagnosis Date  . Fibroid uterus    fibroid lymphoma  . Gastritis   . Glaucoma   . Hypercalcuria     Patient Active Problem List   Diagnosis Date Noted  . Abdominal pain, chronic, epigastric   . Hematemesis 05/16/2016  . Heme + stool 05/16/2016  . Abdominal pain, epigastric 05/16/2016    Past Surgical History:  Procedure Laterality Date  . ESOPHAGOGASTRODUODENOSCOPY N/A 05/17/2016   Procedure: ESOPHAGOGASTRODUODENOSCOPY (EGD);  Surgeon: Danie Binder, MD;  Location: [facility];  Service: Endoscopy;  Laterality: N/A;  830   . kidney stents      OB History    Gravida Para Term Preterm AB Living   3 2 2   1 2    SAB TAB Ectopic Multiple Live Births   1       2       Home Medications    Prior  to Admission medications   Medication Sig Start Date End Date Taking? Authorizing Provider  ketorolac (TORADOL) 10 MG tablet Take 1 tablet (10 mg total) by mouth every 6 (six) hours as needed (Do not take Ibuprofen with this medication.). 05/06/16   Virginia Rochester, NP  lansoprazole (PREVACID) 30 MG capsule 1 PO 30 MINS PRIOR TO MEALS FOR 3 MOS THEN ONCE DALY 05/17/16   Danie Binder, MD    Family History Family History  Problem Relation Age of Onset  . Colon cancer Paternal Uncle     Passed away 2015-01-20    Social History Social History  Substance Use Topics  . Smoking status: Current Every Day Smoker    Packs/day: 0.20    Types: Cigarettes  . Smokeless tobacco: Never Used     Comment: 3-4 cigarettes daily  . Alcohol use 2.4 oz/week    4 Shots of liquor per week     Comment: occasionally, once every 2-3 months.     Allergies   Codeine; Percocet [oxycodone-acetaminophen]; and Vicodin [hydrocodone-acetaminophen]   Review of Systems Review of Systems  Constitutional: Positive for activity change and appetite change. Negative for fatigue and fever.  Eyes: Negative for visual disturbance.  Respiratory: Negative for cough, chest tightness and shortness of breath.   Gastrointestinal: Positive for abdominal pain, nausea and vomiting. Negative for diarrhea.  Genitourinary: Positive for dysuria and hematuria. Negative for vaginal bleeding and vaginal discharge.  Musculoskeletal: Positive for back pain. Negative for arthralgias.  Skin: Negative for rash.  Neurological: Negative for dizziness, weakness and headaches.  A complete 10 system review of systems was obtained and all systems are negative except as noted in the HPI and PMH.     Physical Exam Updated Vital Signs BP 119/72 (BP Location: Left Arm)   Pulse 72   Temp 98.2 F (36.8 C) (Oral)   Resp 18   Ht 5' 4"  (1.626 m)   Wt 150 lb (68 kg)   LMP 06/03/2016   SpO2 100%   BMI 25.75 kg/m   Physical Exam  Constitutional:  She is oriented to person, place, and time. She appears distressed.  Screaming and crying, writhing around the bed  HENT:  Head: Normocephalic and atraumatic.  Right Ear: External ear normal.  Left Ear: External ear normal.  Mouth/Throat: Oropharynx is clear and moist.  Eyes: EOM are normal. Pupils are equal, round, and reactive to light.  Neck: Normal range of motion.  Cardiovascular: Normal rate, regular rhythm and normal heart sounds.   No murmur heard. Pulmonary/Chest: Effort normal. No respiratory distress.  Abdominal: Soft. There is tenderness.  Exam limited due to patient's behavior. TTP R upper and lower quadrants. No guarding or rebound  Genitourinary:  Genitourinary Comments: Chaperone present. No hemorrhoids, no gross blood  Musculoskeletal: She exhibits tenderness.  R CVAT  Neurological: She is alert and oriented to person, place, and time. No cranial nerve deficit.  CN 2-12 intact, no ataxia on finger to nose, no nystagmus, 5/5 strength throughout, no pronator drift, Romberg negative, normal gait.   Skin: Skin is warm. Capillary refill takes less than 2 seconds.     ED Treatments / Results  Labs (all labs ordered are listed, but only abnormal results are displayed) Labs Reviewed  CBC WITH DIFFERENTIAL/PLATELET - Abnormal; Notable for the following:       Result Value   WBC 22.0 (*)    Neutro Abs 15.6 (*)    Lymphs Abs 4.5 (*)    Monocytes Absolute 1.7 (*)    All other components within normal limits  COMPREHENSIVE METABOLIC PANEL - Abnormal; Notable for the following:    Potassium 3.4 (*)    CO2 19 (*)    Glucose, Bld 146 (*)    All other components within normal limits  URINALYSIS, ROUTINE W REFLEX MICROSCOPIC (NOT AT [facility]) - Abnormal; Notable for the following:    Color, Urine RED (*)    APPearance TURBID (*)    Glucose, UA 100 (*)    Hgb urine dipstick LARGE (*)    Ketones, ur 15 (*)    Protein, ur >300 (*)    Nitrite POSITIVE (*)    Leukocytes, UA  MODERATE (*)    All other components within normal limits  URINE MICROSCOPIC-ADD ON - Abnormal; Notable for the following:    Bacteria, UA MANY (*)    All other components within normal limits  URINE CULTURE  LIPASE, BLOOD  PREGNANCY, URINE  CBC  BASIC METABOLIC PANEL  POC OCCULT BLOOD, ED    EKG  EKG Interpretation None       Radiology Ct Renal Stone Study  Result Date: 07/04/2016 CLINICAL DATA:  Abdominal pain all over with nausea and vomiting for 2 days, smoker EXAM: CT ABDOMEN AND PELVIS WITHOUT CONTRAST TECHNIQUE: Multidetector CT imaging of the abdomen and pelvis was performed following the  standard protocol without IV contrast. Sagittal and coronal MPR images reconstructed from axial data set. Oral contrast not administered for this indication COMPARISON:  05/14/2016 FINDINGS: Lower chest:  Lung bases clear Hepatobiliary: Gallbladder and liver normal appearance for technique Pancreas: Normal appearance Spleen: Normal appearance Adrenals/Urinary Tract: Renal glands normal appearance. BILATERAL nonobstructing renal calculi. RIGHT hydronephrosis secondary to a 4 mm RIGHT UPJ calculus. No ureteral calcification or dilatation. Bladder decompressed. Stomach/Bowel: Normal appendix. Small hiatal hernia. Stomach and bowel loops otherwise normal appearance Vascular/Lymphatic: Few pelvic phleboliths.  No adenopathy. Reproductive: Probable small uterine leiomyoma LEFT lateral, sub serosal, 2.4 x 2.3 cm image 68. Uterus and adnexa otherwise normal appearance Other: No free air or free fluid. No acute inflammatory process or hernia. Musculoskeletal: Osseous structures unremarkable IMPRESSION: BILATERAL nonobstructing renal calculi. Additionally, mild RIGHT hydronephrosis secondary to a 4 mm RIGHT UPJ calculus. Small hiatal hernia. Small uterine leiomyoma. Electronically Signed   By: Lavonia Dana M.D.   On: 07/04/2016 07:46    Procedures Procedures (including critical care time)  Medications  Ordered in ED Medications  sodium chloride 0.9 % bolus 1,000 mL (1,000 mLs Intravenous New Bag/Given 07/04/16 0728)  ondansetron (ZOFRAN) injection 4 mg (4 mg Intravenous Given 07/04/16 0643)  HYDROmorphone (DILAUDID) injection 1 mg (1 mg Intravenous Given 07/04/16 0723)  ondansetron (ZOFRAN) injection 4 mg (4 mg Intravenous Given 07/04/16 0722)  LORazepam (ATIVAN) injection 1 mg (1 mg Intravenous Given 07/04/16 0723)  cefTRIAXone (ROCEPHIN) 1 g in dextrose 5 % 50 mL IVPB (0 g Intravenous Stopped 07/04/16 0753)     Initial Impression / Assessment and Plan / ED Course  I have reviewed the triage vital signs and the nursing notes.  Pertinent labs & imaging results that were available during my care of the patient were reviewed by me and considered in my medical decision making (see chart for details).  Clinical Course  Right-sided abdominal pain with nausea and vomiting. Patient is hysterical and writhing around on the bed. Abdomen soft without peritoneal signs with tenderness on the right side.  Urinalysis positive for infection as well as blood.  EGD 05/17/16 HEMATEMESIS DUE TO LA Grade A reflux esophagitis. - MILD Gastritis. - Medium-sized hiatal hernia. - NAUSEA/VOMITING AND EPIGASTRIC PAIN DUE TO REFLUX  Labs show leukocytosis which she has had previously.  Urine culture sent, rocephin started. CT will be obtained to rule out infected stone.  CT shows 4 mm R UPJ stone with hydronephrosis.  D/w Dr. Louis Meckel. He feels with +nitrite and leukocytosis, patient should have stent.  He will plan stent this afternoon.  Dr. Roderic Palau to admit.  Final Clinical Impressions(s) / ED Diagnoses   Final diagnoses:  Flank pain  UTI (lower urinary tract infection)  Ureteral stone    New Prescriptions New Prescriptions   No medications on file     Ezequiel Essex, MD 07/04/16 1714

## 2016-07-04 NOTE — Op Note (Signed)
Preoperative diagnosis:  1. Right obstructing/infected stone   Postoperative diagnosis:  1. same   Procedure:  1. Cystoscopy 2. right ureteral stent placement 3. right retrograde pyelography with interpretation   Surgeon: Ardis Hughs, MD  Anesthesia: General  Complications: None  Intraoperative findings:  right retrograde pyelography demonstrated a filling defect within the right ureter consistent with the patient's known calculus without other abnormalities.  EBL: Minimal  Specimens: None  Indication: Jeanne Mcneil is a 30 y.o. patient with infected and obstructed right proximal ureteral stone. After reviewing the management options for treatment, he elected to proceed with the above surgical procedure(s). We have discussed the potential benefits and risks of the procedure, side effects of the proposed treatment, the likelihood of the patient achieving the goals of the procedure, and any potential problems that might occur during the procedure or recuperation. Informed consent has been obtained.  Description of procedure:  The patient was taken to the operating room and general anesthesia was induced.  The patient was placed in the dorsal lithotomy position, prepped and draped in the usual sterile fashion, and preoperative antibiotics were administered. A preoperative time-out was performed.   Cystourethroscopy was performed.  The patient's urethra was examined and was normal.  The bladder was then systematically examined in its entirety. There was no evidence for any bladder tumors, stones, or other mucosal pathology.    Attention then turned to the rightureteral orifice and a ureteral catheter was used to intubate the ureteral orifice.  Omnipaque contrast was injected through the ureteral catheter and a retrograde pyelogram was performed with findings as dictated above.  A 0.38 sensor guidewire was then advanced up the right ureter into the renal pelvis under  fluoroscopic guidance.  The wire was then backloaded through the cystoscope and a ureteral stent was advance over the wire using Seldinger technique.  The stent was positioned appropriately under fluoroscopic and cystoscopic guidance.  The wire was then removed with an adequate stent curl noted in the renal pelvis as well as in the bladder.  The bladder was then emptied and the procedure ended.  The patient appeared to tolerate the procedure well and without complications.  The patient was able to be awakened and transferred to the recovery unit in satisfactory condition.    Ardis Hughs, M.D.

## 2016-07-04 NOTE — Transfer of Care (Signed)
Immediate Anesthesia Transfer of Care Note  Patient: Jeanne Mcneil  Procedure(s) Performed: Procedure(s): CYSTOSCOPY WITH RIGHT RETROGRADE PYELOGRAM, RIGHT URETERAL STENT PLACEMENT (Right)  Patient Location: PACU  Anesthesia Type:General  Level of Consciousness: awake and alert   Airway & Oxygen Therapy: Patient Spontanous Breathing and Patient connected to face mask oxygen  Post-op Assessment: Report given to RN  Post vital signs: Reviewed and stable  Last Vitals:  Vitals:   07/04/16 1735 07/04/16 1829  BP:  (!) 140/91  Pulse:  (!) 110  Resp: (!) 25 (!) 22  Temp:  36.7 C    Last Pain:  Vitals:   07/04/16 1714  TempSrc: Oral  PainSc: 8       Patients Stated Pain Goal: 9 (68/03/21 2248)  Complications: No apparent anesthesia complications

## 2016-07-05 DIAGNOSIS — K219 Gastro-esophageal reflux disease without esophagitis: Secondary | ICD-10-CM

## 2016-07-05 LAB — URINE CULTURE: CULTURE: NO GROWTH

## 2016-07-05 LAB — BASIC METABOLIC PANEL
Anion gap: 5 (ref 5–15)
BUN: 6 mg/dL (ref 6–20)
CO2: 26 mmol/L (ref 22–32)
Calcium: 8.3 mg/dL — ABNORMAL LOW (ref 8.9–10.3)
Chloride: 106 mmol/L (ref 101–111)
Creatinine, Ser: 0.59 mg/dL (ref 0.44–1.00)
GFR calc Af Amer: >60 mL/min (ref >60)
GFR calc non Af Amer: >60 mL/min (ref >60)
Glucose, Bld: 100 mg/dL — ABNORMAL HIGH (ref 65–99)
Potassium: 3.7 mmol/L (ref 3.5–5.1)
Sodium: 137 mmol/L (ref 135–145)

## 2016-07-05 LAB — CBC
HCT: 34.8 % — ABNORMAL LOW (ref 36.0–46.0)
Hemoglobin: 11.3 g/dL — ABNORMAL LOW (ref 12.0–15.0)
MCH: 30.3 pg (ref 26.0–34.0)
MCHC: 32.5 g/dL (ref 30.0–36.0)
MCV: 93.3 fL (ref 78.0–100.0)
Platelets: 277 10*3/uL (ref 150–400)
RBC: 3.73 MIL/uL — ABNORMAL LOW (ref 3.87–5.11)
RDW: 14.5 % (ref 11.5–15.5)
WBC: 19 10*3/uL — ABNORMAL HIGH (ref 4.0–10.5)

## 2016-07-05 MED ORDER — TRAMADOL HCL 50 MG PO TABS
100.0000 mg | ORAL_TABLET | Freq: Four times a day (QID) | ORAL | Status: DC | PRN
  Administered 2016-07-05 – 2016-07-07 (×3): 100 mg via ORAL
  Filled 2016-07-05 (×3): qty 2

## 2016-07-05 MED ORDER — NICOTINE 21 MG/24HR TD PT24
21.0000 mg | MEDICATED_PATCH | Freq: Every day | TRANSDERMAL | Status: DC
  Administered 2016-07-05 – 2016-07-07 (×3): 21 mg via TRANSDERMAL
  Filled 2016-07-05 (×3): qty 1

## 2016-07-05 NOTE — Progress Notes (Signed)
PROGRESS NOTE    TRULY STANKIEWICZ  MKL:[id number] DOB: 02-Jul-1986 DOA: 07/04/2016 PCP: No PCP Per Patient    Brief Narrative: Jeanne Mcneil is a 30 y.o. female presented with complaints of abdominal pain.  Assessment & Plan:   Active Problems:   Ureteral stone   UTI (urinary tract infection)   Hydronephrosis of right kidney   GERD (gastroesophageal reflux disease)   Ureteral calculi  Ureteral Stone: underwent cystoscopy , right uretereal stent placement and right retrograde pyelography , on 8/15. Started on rocephin. Urine cultures sent and pending.  Pain control.  IV fluids.    Right hydronephrosis: See above, and outpatient follow up with urology .    UTI: On rocephin, cultures are pending.   GERD: will start her on PPI.     DVT prophylaxis: (Lovenox/ Code Status: (Full) Family Communication: family at bedside.  Disposition Plan: pending further evaluation.    Consultants:   Urology.    Procedures:  A 24 cm x 6 French double-J ureteral stent  8/15   Antimicrobials:  Rocephin. 8/16  Subjective: Pain persistent but better than yesterday.   Objective: Vitals:   07/04/16 1902 07/04/16 2131 07/04/16 2300 07/05/16 0500  BP: 116/79 (!) 169/87 131/79 108/67  Pulse: 77 (!) 58 (!) 104 80  Resp: 16 20 20 20   Temp: 97.5 F (36.4 C) 98.3 F (36.8 C) 98.4 F (36.9 C) 98.5 F (36.9 C)  TempSrc:  Oral  Oral  SpO2: 100% 97% 94% 98%  Weight:      Height:        Intake/Output Summary (Last 24 hours) at 07/05/16 1248 Last data filed at 07/05/16 0500  Gross per 24 hour  Intake             1940 ml  Output              510 ml  Net             1430 ml   Filed Weights   07/04/16 0743 07/04/16 1714  Weight: 68 kg (150 lb) 68 kg (150 lb)    Examination:  General exam: Appears calm and comfortable  Respiratory system: Clear to auscultation. Respiratory effort normal. Cardiovascular system: S1 & S2 heard, RRR. No JVD, murmurs, rubs, gallops or clicks. No  pedal edema. Gastrointestinal system: Abdomen is nondistended, soft and nontender. No organomegaly or masses felt. Normal bowel sounds heard. Central nervous system: Alert and oriented. No focal neurological deficits. Extremities: Symmetric 5 x 5 power. Skin: No rashes, lesions or ulcers Psychiatry: Judgement and insight appear normal. Mood & affect appropriate.     Data Reviewed: I have personally reviewed following labs and imaging studies  CBC:  Recent Labs Lab 07/04/16 0629 07/05/16 0540  WBC 22.0* 19.0*  NEUTROABS 15.6*  --   HGB 14.0 11.3*  HCT 42.1 34.8*  MCV 91.1 93.3  PLT 354 697   Basic Metabolic Panel:  Recent Labs Lab 07/04/16 0629 07/05/16 0540  NA 139 137  K 3.4* 3.7  CL 108 106  CO2 19* 26  GLUCOSE 146* 100*  BUN 9 6  CREATININE 0.86 0.59  CALCIUM 9.2 8.3*   GFR: Estimated Creatinine Clearance: 95.6 mL/min (by C-G formula based on SCr of 0.8 mg/dL). Liver Function Tests:  Recent Labs Lab 07/04/16 0629  AST 22  ALT 24  ALKPHOS 60  BILITOT 0.4  PROT 7.4  ALBUMIN 4.3    Recent Labs Lab 07/04/16 0629  LIPASE 21  No results for input(s): AMMONIA in the last 168 hours. Coagulation Profile: No results for input(s): INR, PROTIME in the last 168 hours. Cardiac Enzymes: No results for input(s): CKTOTAL, CKMB, CKMBINDEX, TROPONINI in the last 168 hours. BNP (last 3 results) No results for input(s): PROBNP in the last 8760 hours. HbA1C: No results for input(s): HGBA1C in the last 72 hours. CBG: No results for input(s): GLUCAP in the last 168 hours. Lipid Profile: No results for input(s): CHOL, HDL, LDLCALC, TRIG, CHOLHDL, LDLDIRECT in the last 72 hours. Thyroid Function Tests: No results for input(s): TSH, T4TOTAL, FREET4, T3FREE, THYROIDAB in the last 72 hours. Anemia Panel: No results for input(s): VITAMINB12, FOLATE, FERRITIN, TIBC, IRON, RETICCTPCT in the last 72 hours. Sepsis Labs: No results for input(s): PROCALCITON, LATICACIDVEN  in the last 168 hours.  Recent Results (from the past 240 hour(s))  Urine culture     Status: None   Collection Time: 07/04/16  6:44 AM  Result Value Ref Range Status   Specimen Description URINE, CLEAN CATCH  Final   Special Requests NONE  Final   Culture NO GROWTH Performed at [facility]   Final   Report Status 07/05/2016 FINAL  Final         Radiology Studies: Dg Retrograde Pyelogram  Result Date: 07/04/2016 CLINICAL DATA:  Further evaluation of right ureteral calculus EXAM: RETROGRADE PYELOGRAM; DG C-ARM 1-60 MIN-NO REPORT COMPARISON:  CT abdomen and pelvis 07/04/2016 FINDINGS: Fluoroscopy is performed during retrograde injection of contrast into the right ureter. A radiation safety time-out was performed. Fluoroscopy time is recorded at 20 seconds. Six spot fluoroscopic images were obtained. Images obtained during retrograde injection of contrast material into the right ureter demonstrate free flow of contrast material throughout the right ureter with contrast material seen in dilated right renal pelvis and calices. Later images obtained demonstrate placement of a stent into the right ureter with proximal pigtail in the right renal pelvis. Distal pigtail is not shown. IMPRESSION: Dilatation of the right renal collecting system of the level of the ureteropelvic junction with subsequent placement of the ureteral stent. Electronically Signed   By: Lucienne Capers M.D.   On: 07/04/2016 23:39   Dg C-arm 1-60 Min-no Report  Result Date: 07/04/2016 CLINICAL DATA:  Further evaluation of right ureteral calculus EXAM: RETROGRADE PYELOGRAM; DG C-ARM 1-60 MIN-NO REPORT COMPARISON:  CT abdomen and pelvis 07/04/2016 FINDINGS: Fluoroscopy is performed during retrograde injection of contrast into the right ureter. A radiation safety time-out was performed. Fluoroscopy time is recorded at 20 seconds. Six spot fluoroscopic images were obtained. Images obtained during retrograde injection of  contrast material into the right ureter demonstrate free flow of contrast material throughout the right ureter with contrast material seen in dilated right renal pelvis and calices. Later images obtained demonstrate placement of a stent into the right ureter with proximal pigtail in the right renal pelvis. Distal pigtail is not shown. IMPRESSION: Dilatation of the right renal collecting system of the level of the ureteropelvic junction with subsequent placement of the ureteral stent. Electronically Signed   By: Lucienne Capers M.D.   On: 07/04/2016 23:39   Ct Renal Stone Study  Result Date: 07/04/2016 CLINICAL DATA:  Abdominal pain all over with nausea and vomiting for 2 days, smoker EXAM: CT ABDOMEN AND PELVIS WITHOUT CONTRAST TECHNIQUE: Multidetector CT imaging of the abdomen and pelvis was performed following the standard protocol without IV contrast. Sagittal and coronal MPR images reconstructed from axial data set. Oral contrast not administered  for this indication COMPARISON:  05/14/2016 FINDINGS: Lower chest:  Lung bases clear Hepatobiliary: Gallbladder and liver normal appearance for technique Pancreas: Normal appearance Spleen: Normal appearance Adrenals/Urinary Tract: Renal glands normal appearance. BILATERAL nonobstructing renal calculi. RIGHT hydronephrosis secondary to a 4 mm RIGHT UPJ calculus. No ureteral calcification or dilatation. Bladder decompressed. Stomach/Bowel: Normal appendix. Small hiatal hernia. Stomach and bowel loops otherwise normal appearance Vascular/Lymphatic: Few pelvic phleboliths.  No adenopathy. Reproductive: Probable small uterine leiomyoma LEFT lateral, sub serosal, 2.4 x 2.3 cm image 68. Uterus and adnexa otherwise normal appearance Other: No free air or free fluid. No acute inflammatory process or hernia. Musculoskeletal: Osseous structures unremarkable IMPRESSION: BILATERAL nonobstructing renal calculi. Additionally, mild RIGHT hydronephrosis secondary to a 4 mm RIGHT  UPJ calculus. Small hiatal hernia. Small uterine leiomyoma. Electronically Signed   By: Lavonia Dana M.D.   On: 07/04/2016 07:46        Scheduled Meds: . cefTRIAXone (ROCEPHIN)  IV  1 g Intravenous Q24H  . famotidine (PEPCID) IV  20 mg Intravenous Q12H   Continuous Infusions: . sodium chloride 125 mL/hr at 07/05/16 1058     LOS: 1 day    Time spent: 25 minutes.     Hosie Poisson, MD [facility] Pager [phone]   If 7PM-7AM, please contact night-coverage [email or web address] Password [facility] 07/05/2016, 12:48 PM

## 2016-07-05 NOTE — Care Management Note (Signed)
Case Management Note  Patient Details  Name: LAI HENDRIKS MRN: [id number] Date of Birth: 1986-11-20  Subjective/Objective:                  Pt is from home and is ind with ADL's. Pt has no insurance and has worked with the [facility] prior to this admission. She goes to the [facility] for PCP care and she will f/u with urology after DC.   Action/Plan: No CM needs anticipated.   Expected Discharge Date:     07/06/2016             Expected Discharge Plan:  Home/Self Care  In-House Referral:  Financial Counselor  Discharge planning Services  CM Consult  Post Acute Care Choice:  NA Choice offered to:  NA  DME Arranged:    DME Agency:     HH Arranged:    HH Agency:     Status of Service:  Completed, signed off  If discussed at [employer] of Stay Meetings, dates discussed:    Additional Comments:  Sherald Barge, RN 07/05/2016, 9:09 AM

## 2016-07-06 LAB — URINE MICROSCOPIC-ADD ON: Squamous Epithelial / LPF: NONE SEEN

## 2016-07-06 LAB — CBC
HEMATOCRIT: 36.1 % (ref 36.0–46.0)
HEMOGLOBIN: 11.8 g/dL — AB (ref 12.0–15.0)
MCH: 30.2 pg (ref 26.0–34.0)
MCHC: 32.7 g/dL (ref 30.0–36.0)
MCV: 92.3 fL (ref 78.0–100.0)
Platelets: 268 10*3/uL (ref 150–400)
RBC: 3.91 MIL/uL (ref 3.87–5.11)
RDW: 14.1 % (ref 11.5–15.5)
WBC: 11.4 10*3/uL — ABNORMAL HIGH (ref 4.0–10.5)

## 2016-07-06 LAB — URINALYSIS, ROUTINE W REFLEX MICROSCOPIC
Bilirubin Urine: NEGATIVE
Glucose, UA: NEGATIVE mg/dL
Ketones, ur: >80 mg/dL — AB
NITRITE: NEGATIVE
PH: 5.5 (ref 5.0–8.0)
Protein, ur: 30 mg/dL — AB
SPECIFIC GRAVITY, URINE: 1.015 (ref 1.005–1.030)

## 2016-07-06 MED ORDER — LORAZEPAM 1 MG PO TABS
1.0000 mg | ORAL_TABLET | Freq: Three times a day (TID) | ORAL | Status: DC | PRN
  Administered 2016-07-06 (×2): 1 mg via ORAL
  Filled 2016-07-06 (×2): qty 1

## 2016-07-06 MED ORDER — PANTOPRAZOLE SODIUM 40 MG PO TBEC
40.0000 mg | DELAYED_RELEASE_TABLET | Freq: Every day | ORAL | Status: DC
  Administered 2016-07-06 – 2016-07-07 (×2): 40 mg via ORAL
  Filled 2016-07-06 (×2): qty 1

## 2016-07-06 NOTE — Anesthesia Postprocedure Evaluation (Signed)
Anesthesia Post Note  Patient: Jeanne Mcneil  Procedure(s) Performed: Procedure(s) (LRB): CYSTOSCOPY WITH RIGHT RETROGRADE PYELOGRAM, RIGHT URETERAL STENT PLACEMENT (Right)  Patient location during evaluation: Nursing Unit Anesthesia Type: General Level of consciousness: awake and alert, oriented and patient cooperative Pain management: pain level controlled Vital Signs Assessment: post-procedure vital signs reviewed and stable Respiratory status: spontaneous breathing, nonlabored ventilation and respiratory function stable Cardiovascular status: blood pressure returned to baseline and stable Postop Assessment: no signs of nausea or vomiting Anesthetic complications: no    Last Vitals:  Vitals:   07/05/16 2200 07/06/16 0503  BP: 135/87 128/88  Pulse: 80 65  Resp: 20 20  Temp: 37.1 C 36.6 C    Last Pain:  Vitals:   07/06/16 1046  TempSrc:   PainSc: 0-No pain                 [name]

## 2016-07-06 NOTE — Addendum Note (Signed)
Addendum  created 07/06/16 1155 by Charmaine Downs, CRNA   Sign clinical note

## 2016-07-06 NOTE — Progress Notes (Signed)
PROGRESS NOTE    Jeanne Mcneil  MWN:[id number] DOB: 1986-03-06 DOA: 07/04/2016 PCP: No PCP Per Patient    Brief Narrative: Jeanne Mcneil is a 30 y.o. female presented with complaints of abdominal pain.  Assessment & Plan:   Active Problems:   Ureteral stone   UTI (urinary tract infection)   Hydronephrosis of right kidney   GERD (gastroesophageal reflux disease)   Ureteral calculi  Ureteral Stone: underwent cystoscopy , right uretereal stent placement and right retrograde pyelography , on 8/15. Started on rocephin. Urine cultures sent , show multiple bacterial morphotypes, will plan for vantin to complete the course on discharge.  Pain control.   resume IV fluids.    Right hydronephrosis: See above, and outpatient follow up with urology on discharge    UTI: On rocephin, cultures show multiple bacterial morphotype's. Marland Kitchen   GERD: will start her on PPI.   Persistent hematuria: probably from stent and ureteral stone. Will get repeat UA, and if her urine clears up will plan for discharge in am.  If her urine doesn't clear , will request urology to see the patient.  Get cbc in am.   Leukocytosis: improving infection.    Anemia: ? Anemia of blood loss from hematuria.  Currently stable around 11.  It was 14 on admission.   Anxiety: Prn ativan.   Hypokalemia: repleted , repeat in am.    DVT prophylaxis: (Lovenox/ Code Status: (Full) Family Communication: family at bedside.  Disposition Plan: possible home tomorrow.    Consultants:   Urology.    Procedures:  A 24 cm x 6 French double-J ureteral stent  8/15   Antimicrobials:  Rocephin. 8/16  Subjective: Reports persistent hematuria.   Objective: Vitals:   07/05/16 0500 07/05/16 1549 07/05/16 2200 07/06/16 0503  BP: 108/67 119/74 135/87 128/88  Pulse: 80 72 80 65  Resp: 20 20 20 20   Temp: 98.5 F (36.9 C) 98.4 F (36.9 C) 98.8 F (37.1 C) 97.8 F (36.6 C)  TempSrc: Oral  Oral Oral  SpO2: 98% 99%  99% 100%  Weight:      Height:        Intake/Output Summary (Last 24 hours) at 07/06/16 1150 Last data filed at 07/06/16 0615  Gross per 24 hour  Intake          3040.42 ml  Output              803 ml  Net          2237.42 ml   Filed Weights   07/04/16 0743 07/04/16 1714  Weight: 68 kg (150 lb) 68 kg (150 lb)    Examination:  General exam: Appears calm and comfortable  Respiratory system: Clear to auscultation. Respiratory effort normal. Cardiovascular system: S1 & S2 heard, RRR. No JVD, murmurs, rubs, gallops or clicks. No pedal edema. Gastrointestinal system: Abdomen is nondistended, soft and nontender. No organomegaly or masses felt. Normal bowel sounds heard. Central nervous system: Alert and oriented. No focal neurological deficits. Extremities: Symmetric 5 x 5 power. Skin: No rashes, lesions or ulcers Psychiatry: Judgement and insight appear normal. Mood & affect appropriate.     Data Reviewed: I have personally reviewed following labs and imaging studies  CBC:  Recent Labs Lab 07/04/16 0629 07/05/16 0540 07/06/16 1030  WBC 22.0* 19.0* 11.4*  NEUTROABS 15.6*  --   --   HGB 14.0 11.3* 11.8*  HCT 42.1 34.8* 36.1  MCV 91.1 93.3 92.3  PLT 354 277 268  Basic Metabolic Panel:  Recent Labs Lab 07/04/16 0629 07/05/16 0540  NA 139 137  K 3.4* 3.7  CL 108 106  CO2 19* 26  GLUCOSE 146* 100*  BUN 9 6  CREATININE 0.86 0.59  CALCIUM 9.2 8.3*   GFR: Estimated Creatinine Clearance: 95.6 mL/min (by C-G formula based on SCr of 0.8 mg/dL). Liver Function Tests:  Recent Labs Lab 07/04/16 0629  AST 22  ALT 24  ALKPHOS 60  BILITOT 0.4  PROT 7.4  ALBUMIN 4.3    Recent Labs Lab 07/04/16 0629  LIPASE 21   No results for input(s): AMMONIA in the last 168 hours. Coagulation Profile: No results for input(s): INR, PROTIME in the last 168 hours. Cardiac Enzymes: No results for input(s): CKTOTAL, CKMB, CKMBINDEX, TROPONINI in the last 168 hours. BNP (last  3 results) No results for input(s): PROBNP in the last 8760 hours. HbA1C: No results for input(s): HGBA1C in the last 72 hours. CBG: No results for input(s): GLUCAP in the last 168 hours. Lipid Profile: No results for input(s): CHOL, HDL, LDLCALC, TRIG, CHOLHDL, LDLDIRECT in the last 72 hours. Thyroid Function Tests: No results for input(s): TSH, T4TOTAL, FREET4, T3FREE, THYROIDAB in the last 72 hours. Anemia Panel: No results for input(s): VITAMINB12, FOLATE, FERRITIN, TIBC, IRON, RETICCTPCT in the last 72 hours. Sepsis Labs: No results for input(s): PROCALCITON, LATICACIDVEN in the last 168 hours.  Recent Results (from the past 240 hour(s))  Urine culture     Status: None   Collection Time: 07/04/16  6:44 AM  Result Value Ref Range Status   Specimen Description URINE, CLEAN CATCH  Final   Special Requests NONE  Final   Culture NO GROWTH Performed at [facility]   Final   Report Status 07/05/2016 FINAL  Final         Radiology Studies: Dg Retrograde Pyelogram  Result Date: 07/04/2016 CLINICAL DATA:  Further evaluation of right ureteral calculus EXAM: RETROGRADE PYELOGRAM; DG C-ARM 1-60 MIN-NO REPORT COMPARISON:  CT abdomen and pelvis 07/04/2016 FINDINGS: Fluoroscopy is performed during retrograde injection of contrast into the right ureter. A radiation safety time-out was performed. Fluoroscopy time is recorded at 20 seconds. Six spot fluoroscopic images were obtained. Images obtained during retrograde injection of contrast material into the right ureter demonstrate free flow of contrast material throughout the right ureter with contrast material seen in dilated right renal pelvis and calices. Later images obtained demonstrate placement of a stent into the right ureter with proximal pigtail in the right renal pelvis. Distal pigtail is not shown. IMPRESSION: Dilatation of the right renal collecting system of the level of the ureteropelvic junction with subsequent placement  of the ureteral stent. Electronically Signed   By: Lucienne Capers M.D.   On: 07/04/2016 23:39   Dg C-arm 1-60 Min-no Report  Result Date: 07/04/2016 CLINICAL DATA:  Further evaluation of right ureteral calculus EXAM: RETROGRADE PYELOGRAM; DG C-ARM 1-60 MIN-NO REPORT COMPARISON:  CT abdomen and pelvis 07/04/2016 FINDINGS: Fluoroscopy is performed during retrograde injection of contrast into the right ureter. A radiation safety time-out was performed. Fluoroscopy time is recorded at 20 seconds. Six spot fluoroscopic images were obtained. Images obtained during retrograde injection of contrast material into the right ureter demonstrate free flow of contrast material throughout the right ureter with contrast material seen in dilated right renal pelvis and calices. Later images obtained demonstrate placement of a stent into the right ureter with proximal pigtail in the right renal pelvis. Distal pigtail is not shown.  IMPRESSION: Dilatation of the right renal collecting system of the level of the ureteropelvic junction with subsequent placement of the ureteral stent. Electronically Signed   By: Lucienne Capers M.D.   On: 07/04/2016 23:39        Scheduled Meds: . cefTRIAXone (ROCEPHIN)  IV  1 g Intravenous Q24H  . famotidine (PEPCID) IV  20 mg Intravenous Q12H  . nicotine  21 mg Transdermal Daily   Continuous Infusions: . sodium chloride 125 mL/hr at 07/06/16 0502     LOS: 2 days    Time spent: 25 minutes.     Hosie Poisson, MD [facility] Pager [phone]   If 7PM-7AM, please contact night-coverage [email or web address] Password [email or web address] 07/06/2016, 11:50 AM

## 2016-07-07 ENCOUNTER — Encounter (HOSPITAL_COMMUNITY): Payer: Self-pay | Admitting: Urology

## 2016-07-07 MED ORDER — SENNOSIDES-DOCUSATE SODIUM 8.6-50 MG PO TABS
2.0000 | ORAL_TABLET | Freq: Two times a day (BID) | ORAL | Status: DC
  Administered 2016-07-07: 2 via ORAL
  Filled 2016-07-07: qty 2

## 2016-07-07 MED ORDER — SENNOSIDES-DOCUSATE SODIUM 8.6-50 MG PO TABS: 2.0000 | ORAL_TABLET | Freq: Two times a day (BID) | ORAL | 0 refills | Status: DC

## 2016-07-07 MED ORDER — POLYETHYLENE GLYCOL 3350 17 G PO PACK: 17.0000 g | PACK | Freq: Every day | ORAL | 0 refills | Status: DC

## 2016-07-07 MED ORDER — POLYETHYLENE GLYCOL 3350 17 G PO PACK
17.0000 g | PACK | Freq: Every day | ORAL | Status: DC
  Administered 2016-07-07: 17 g via ORAL
  Filled 2016-07-07: qty 1

## 2016-07-07 MED ORDER — TRAMADOL HCL 50 MG PO TABS: 50.0000 mg | ORAL_TABLET | Freq: Every day | ORAL | 0 refills | Status: DC

## 2016-07-07 NOTE — Discharge Summary (Signed)
Physician Discharge Summary  Jeanne Mcneil EQA:[id number] DOB: [birth year] DOA: 07/04/2016  PCP: No PCP Per Patient  Admit date: 07/04/2016 Discharge date: 07/07/2016  Admitted From: Home Disposition:  Home.   Recommendations for Outpatient Follow-up:  1. Follow up with PCP in 1-2 weeks 2. Please obtain BMP/CBC in one week 3. Please follow up with urology as recommended.     Discharge Condition:stable. CODE STATUS:full code.  Diet recommendation: regular diet.   Brief/Interim Summary: Jeanne Benedict Farmeris a 30 y.o.femalepresented with complaints of abdominal pain.  Discharge Diagnoses:  Active Problems:   Ureteral stone   UTI (urinary tract infection)   Hydronephrosis of right kidney   GERD (gastroesophageal reflux disease)   Ureteral calculi  Ureteral Stone: underwent cystoscopy , right uretereal stent placement and right retrograde pyelography , on 8/15. Started on rocephin. Urine cultures sent , show multiple bacterial morphotypes, will plan for vantin to complete the course on discharge.  Pain control.     Right hydronephrosis: See above, and outpatient follow up with urology on discharge    UTI: On rocephin, cultures show multiple bacterial morphotype's. . Discharged on vantin.   GERD: will start her on PPI.   Persistent hematuria: clearing up.   Leukocytosis: improving infection.    Anemia: ? Anemia of blood loss from hematuria.  Currently stable around 11.   Hypokalemia: replete as needed.    Discharge Instructions  Discharge Instructions    Discharge instructions    Complete by:  As directed   Follow up with PCP in one week.  Please follow up with urology clinic in 1 to 2 weeks as recommended.       Medication List    TAKE these medications   lansoprazole 15 MG capsule Commonly known as:  PREVACID Take 15 mg by mouth daily at 12 noon.   polyethylene glycol packet Commonly known as:  MIRALAX / GLYCOLAX Take 17 g by mouth  daily.   senna-docusate 8.6-50 MG tablet Commonly known as:  Senokot-S Take 2 tablets by mouth 2 (two) times daily.   traMADol 50 MG tablet Commonly known as:  ULTRAM Take 1 tablet (50 mg total) by mouth daily.      Follow-up Information    [facility] [place]. Schedule an appointment as soon as possible for a visit in 1 week(s).   Specialty:  Urology Contact information: [address] [address] [phone]         Allergies  Allergen Reactions  . Codeine Hives and Itching    Able to take Dilaudid and Ultram.  . Percocet [Oxycodone-Acetaminophen] Hives and Itching  . Vicodin [Hydrocodone-Acetaminophen] Hives and Itching    Consultations:  Urology.   Procedures/Studies: Dg Retrograde Pyelogram  Result Date: 07/04/2016 CLINICAL DATA:  Further evaluation of right ureteral calculus EXAM: RETROGRADE PYELOGRAM; DG C-ARM 1-60 MIN-NO REPORT COMPARISON:  CT abdomen and pelvis 07/04/2016 FINDINGS: Fluoroscopy is performed during retrograde injection of contrast into the right ureter. A radiation safety time-out was performed. Fluoroscopy time is recorded at 20 seconds. Six spot fluoroscopic images were obtained. Images obtained during retrograde injection of contrast material into the right ureter demonstrate free flow of contrast material throughout the right ureter with contrast material seen in dilated right renal pelvis and calices. Later images obtained demonstrate placement of a stent into the right ureter with proximal pigtail in the right renal pelvis. Distal pigtail is not shown. IMPRESSION: Dilatation of the right renal collecting system of the level of  the ureteropelvic junction with subsequent placement of the ureteral stent. Electronically Signed   By: Lucienne Capers M.D.   On: 07/04/2016 23:39   Dg C-arm 1-60 Min-no Report  Result Date: 07/04/2016 CLINICAL DATA:  Further evaluation of right ureteral calculus EXAM: RETROGRADE  PYELOGRAM; DG C-ARM 1-60 MIN-NO REPORT COMPARISON:  CT abdomen and pelvis 07/04/2016 FINDINGS: Fluoroscopy is performed during retrograde injection of contrast into the right ureter. A radiation safety time-out was performed. Fluoroscopy time is recorded at 20 seconds. Six spot fluoroscopic images were obtained. Images obtained during retrograde injection of contrast material into the right ureter demonstrate free flow of contrast material throughout the right ureter with contrast material seen in dilated right renal pelvis and calices. Later images obtained demonstrate placement of a stent into the right ureter with proximal pigtail in the right renal pelvis. Distal pigtail is not shown. IMPRESSION: Dilatation of the right renal collecting system of the level of the ureteropelvic junction with subsequent placement of the ureteral stent. Electronically Signed   By: Lucienne Capers M.D.   On: 07/04/2016 23:39   Ct Renal Stone Study  Result Date: 07/04/2016 CLINICAL DATA:  Abdominal pain all over with nausea and vomiting for 2 days, smoker EXAM: CT ABDOMEN AND PELVIS WITHOUT CONTRAST TECHNIQUE: Multidetector CT imaging of the abdomen and pelvis was performed following the standard protocol without IV contrast. Sagittal and coronal MPR images reconstructed from axial data set. Oral contrast not administered for this indication COMPARISON:  05/14/2016 FINDINGS: Lower chest:  Lung bases clear Hepatobiliary: Gallbladder and liver normal appearance for technique Pancreas: Normal appearance Spleen: Normal appearance Adrenals/Urinary Tract: Renal glands normal appearance. BILATERAL nonobstructing renal calculi. RIGHT hydronephrosis secondary to a 4 mm RIGHT UPJ calculus. No ureteral calcification or dilatation. Bladder decompressed. Stomach/Bowel: Normal appendix. Small hiatal hernia. Stomach and bowel loops otherwise normal appearance Vascular/Lymphatic: Few pelvic phleboliths.  No adenopathy. Reproductive: Probable  small uterine leiomyoma LEFT lateral, sub serosal, 2.4 x 2.3 cm image 68. Uterus and adnexa otherwise normal appearance Other: No free air or free fluid. No acute inflammatory process or hernia. Musculoskeletal: Osseous structures unremarkable IMPRESSION: BILATERAL nonobstructing renal calculi. Additionally, mild RIGHT hydronephrosis secondary to a 4 mm RIGHT UPJ calculus. Small hiatal hernia. Small uterine leiomyoma. Electronically Signed   By: Lavonia Dana M.D.   On: 07/04/2016 07:46       Subjective: No new complaints.   Discharge Exam: Vitals:   07/06/16 2134 07/07/16 0518  BP: 125/75 118/68  Pulse: 68 77  Resp:  18  Temp: 97.9 F (36.6 C) 98.5 F (36.9 C)   Vitals:   07/06/16 0503 07/06/16 1425 07/06/16 2134 07/07/16 0518  BP: 128/88 133/90 125/75 118/68  Pulse: 65 84 68 77  Resp: 20 20  18   Temp: 97.8 F (36.6 C) 98.1 F (36.7 C) 97.9 F (36.6 C) 98.5 F (36.9 C)  TempSrc: Oral  Oral Oral  SpO2: 100% 100% 96% 96%  Weight:      Height:        General: Pt is alert, awake, not in acute distress Cardiovascular: RRR, S1/S2 +, no rubs, no gallops Respiratory: CTA bilaterally, no wheezing, no rhonchi Abdominal: Soft, NT, ND, bowel sounds + Extremities: no edema, no cyanosis    The results of significant diagnostics from this hospitalization (including imaging, microbiology, ancillary and laboratory) are listed below for reference.     Microbiology: Recent Results (from the past 240 hour(s))  Urine culture     Status: None  Collection Time: 07/04/16  6:44 AM  Result Value Ref Range Status   Specimen Description URINE, CLEAN CATCH  Final   Special Requests NONE  Final   Culture NO GROWTH Performed at [facility]   Final   Report Status 07/05/2016 FINAL  Final     Labs: BNP (last 3 results) No results for input(s): BNP in the last 8760 hours. Basic Metabolic Panel:  Recent Labs Lab 07/04/16 0629 07/05/16 0540  NA 139 137  K 3.4* 3.7  CL 108  106  CO2 19* 26  GLUCOSE 146* 100*  BUN 9 6  CREATININE 0.86 0.59  CALCIUM 9.2 8.3*   Liver Function Tests:  Recent Labs Lab 07/04/16 0629  AST 22  ALT 24  ALKPHOS 60  BILITOT 0.4  PROT 7.4  ALBUMIN 4.3    Recent Labs Lab 07/04/16 0629  LIPASE 21   No results for input(s): AMMONIA in the last 168 hours. CBC:  Recent Labs Lab 07/04/16 0629 07/05/16 0540 07/06/16 1030  WBC 22.0* 19.0* 11.4*  NEUTROABS 15.6*  --   --   HGB 14.0 11.3* 11.8*  HCT 42.1 34.8* 36.1  MCV 91.1 93.3 92.3  PLT 354 277 268   Cardiac Enzymes: No results for input(s): CKTOTAL, CKMB, CKMBINDEX, TROPONINI in the last 168 hours. BNP: Invalid input(s): POCBNP CBG: No results for input(s): GLUCAP in the last 168 hours. D-Dimer No results for input(s): DDIMER in the last 72 hours. Hgb A1c No results for input(s): HGBA1C in the last 72 hours. Lipid Profile No results for input(s): CHOL, HDL, LDLCALC, TRIG, CHOLHDL, LDLDIRECT in the last 72 hours. Thyroid function studies No results for input(s): TSH, T4TOTAL, T3FREE, THYROIDAB in the last 72 hours.  Invalid input(s): FREET3 Anemia work up No results for input(s): VITAMINB12, FOLATE, FERRITIN, TIBC, IRON, RETICCTPCT in the last 72 hours. Urinalysis    Component Value Date/Time   COLORURINE AMBER (A) 07/06/2016 1151   APPEARANCEUR CLOUDY (A) 07/06/2016 1151   LABSPEC 1.015 07/06/2016 1151   PHURINE 5.5 07/06/2016 1151   GLUCOSEU NEGATIVE 07/06/2016 1151   HGBUR LARGE (A) 07/06/2016 1151   BILIRUBINUR NEGATIVE 07/06/2016 1151   KETONESUR >80 (A) 07/06/2016 1151   PROTEINUR 30 (A) 07/06/2016 1151   UROBILINOGEN 0.2 07/02/2015 1757   NITRITE NEGATIVE 07/06/2016 1151   LEUKOCYTESUR SMALL (A) 07/06/2016 1151   Sepsis Labs Invalid input(s): PROCALCITONIN,  WBC,  LACTICIDVEN Microbiology Recent Results (from the past 240 hour(s))  Urine culture     Status: None   Collection Time: 07/04/16  6:44 AM  Result Value Ref Range Status    Specimen Description URINE, CLEAN CATCH  Final   Special Requests NONE  Final   Culture NO GROWTH Performed at [facility]   Final   Report Status 07/05/2016 FINAL  Final     Time coordinating discharge: Over 30 minutes  SIGNED:   Hosie Poisson, MD  [facility] 07/07/2016, 11:27 AM Pager   If 7PM-7AM, please contact night-coverage [email or web address] Password [email or web address]

## 2016-07-07 NOTE — Progress Notes (Signed)
Pt's IV catheter removed and intact. Pt's Iv site clean dry and intact. Discharge instructions including follow up appointments and medications were reviewed and discussed with patient. Pt verbalized understanding of discharge instructions including medications and follow up appointments. All questions were answered and no further questions at this time. Pt in stable condition and in no acute distress at time of discharge. Pt escorted by nurse tech.

## 2016-07-10 ENCOUNTER — Encounter (HOSPITAL_COMMUNITY): Payer: Self-pay | Admitting: Family Medicine

## 2016-07-10 ENCOUNTER — Ambulatory Visit (HOSPITAL_COMMUNITY)
Admission: EM | Admit: 2016-07-10 | Discharge: 2016-07-10 | Disposition: A | Discharge status: Home / Self Care | Payer: Medicaid Other | Attending: Family Medicine | Admitting: Family Medicine

## 2016-07-10 DIAGNOSIS — I808 Phlebitis and thrombophlebitis of other sites: Secondary | ICD-10-CM

## 2016-07-10 DIAGNOSIS — M79601 Pain in right arm: Secondary | ICD-10-CM

## 2016-07-10 MED ORDER — INDOMETHACIN 50 MG PO CAPS: 50.0000 mg | ORAL_CAPSULE | Freq: Three times a day (TID) | ORAL | 0 refills | Status: DC

## 2016-07-10 NOTE — ED Provider Notes (Signed)
[place]    CSN: [id number] Arrival date & time: 07/10/16  1125  First Provider Contact:  None       History   Chief Complaint Chief Complaint  Patient presents with  . Arm Pain    HPI Jeanne Mcneil is a 30 y.o. female.   30 yo woman recently discharged from hospital with kidney stone and gastritis.  She is on the way to the pharmacy to fill her prescriptions.  She noted a right forearm tender chord this morning with erythema at  9:00 this am associated with erythema.  This is at the same site as an IV      Past Medical History:  Diagnosis Date  . Fibroid uterus    fibroid lymphoma  . Gastritis   . Glaucoma   . Hypercalcuria     Patient Active Problem List   Diagnosis Date Noted  . Ureteral stone 07/04/2016  . UTI (urinary tract infection) 07/04/2016  . Hydronephrosis of right kidney 07/04/2016  . GERD (gastroesophageal reflux disease) 07/04/2016  . Ureteral calculi 07/04/2016  . Abdominal pain, chronic, epigastric   . Hematemesis 05/16/2016  . Heme + stool 05/16/2016  . Abdominal pain, epigastric 05/16/2016    Past Surgical History:  Procedure Laterality Date  . CYSTOSCOPY W/ URETERAL STENT PLACEMENT Right 07/04/2016   Procedure: CYSTOSCOPY WITH RIGHT RETROGRADE PYELOGRAM, RIGHT URETERAL STENT PLACEMENT;  Surgeon: Ardis Hughs, MD;  Location: [facility];  Service: Urology;  Laterality: Right;  . ESOPHAGOGASTRODUODENOSCOPY N/A 05/17/2016   Procedure: ESOPHAGOGASTRODUODENOSCOPY (EGD);  Surgeon: Danie Binder, MD;  Location: [facility] SUITE;  Service: Endoscopy;  Laterality: N/A;  830   . kidney stents      OB History    Gravida Para Term Preterm AB Living   3 2 2   1 2    SAB TAB Ectopic Multiple Live Births   1       2       Home Medications    Prior to Admission medications   Medication Sig Start Date End Date Taking? Authorizing Provider  indomethacin (INDOCIN) 50 MG capsule Take 1 capsule (50 mg total) by mouth 3 (three) times  daily with meals. 07/10/16   Robyn Haber, MD  indomethacin (INDOCIN) 50 MG capsule Take 1 capsule (50 mg total) by mouth 3 (three) times daily with meals. 07/10/16   Robyn Haber, MD  lansoprazole (PREVACID) 15 MG capsule Take 15 mg by mouth daily at 12 noon.    Historical Provider, MD  polyethylene glycol (MIRALAX / GLYCOLAX) packet Take 17 g by mouth daily. 07/07/16   Hosie Poisson, MD  senna-docusate (SENOKOT-S) 8.6-50 MG tablet Take 2 tablets by mouth 2 (two) times daily. 07/07/16   Hosie Poisson, MD  traMADol (ULTRAM) 50 MG tablet Take 1 tablet (50 mg total) by mouth daily. 07/07/16   Hosie Poisson, MD    Family History Family History  Problem Relation Age of Onset  . Colon cancer Paternal Uncle     Passed away 02-07-15    Social History Social History  Substance Use Topics  . Smoking status: Current Every Day Smoker    Packs/day: 0.20    Types: Cigarettes  . Smokeless tobacco: Never Used     Comment: 3-4 cigarettes daily  . Alcohol use 2.4 oz/week    4 Shots of liquor per week     Comment: occasionally, once every 2-3 months.     Allergies   Codeine; Percocet [oxycodone-acetaminophen]; and Vicodin [  hydrocodone-acetaminophen]   Review of Systems Review of Systems  Constitutional: Negative.   HENT: Negative.   Eyes: Negative.   Respiratory: Negative.   Cardiovascular: Negative.   Gastrointestinal: Negative.   Musculoskeletal: Positive for myalgias.  Psychiatric/Behavioral: Negative.      Physical Exam Triage Vital Signs ED Triage Vitals [07/10/16 1240]  Enc Vitals Group     BP 123/84     Pulse Rate 75     Resp 18     Temp 98.9 F (37.2 C)     Temp src      SpO2 97 %     Weight      Height      Head Circumference      Peak Flow      Pain Score      Pain Loc      Pain Edu?      Excl. in [place]?    No data found.   Updated Vital Signs BP 123/84   Pulse 75   Temp 98.9 F (37.2 C)   Resp 18   LMP 05/20/2016 (Approximate)   SpO2 97%   Visual  Acuity Right Eye Distance:   Left Eye Distance:   Bilateral Distance:    Right Eye Near:   Left Eye Near:    Bilateral Near:     Physical Exam  Constitutional: She appears well-developed and well-nourished.  HENT:  Head: Normocephalic.  Pulmonary/Chest: Effort normal.  Skin: Skin is warm and dry.  3 cm red streak, middle of full or forearm, 3 cm proximal to the antecubital fossa with underlying induration and tenderness. She has full range of motion of her elbow and shoulder.  Nursing note and vitals reviewed.    UC Treatments / Results  Labs (all labs ordered are listed, but only abnormal results are displayed) Labs Reviewed - No data to display  EKG  EKG Interpretation None       Radiology No results found.  Procedures Procedures (including critical care time)  Medications Ordered in UC Medications - No data to display   Initial Impression / Assessment and Plan / UC Course  I have reviewed the triage vital signs and the nursing notes.  Pertinent labs & imaging results that were available during my care of the patient were reviewed by me and considered in my medical decision making (see chart for details).  Clinical Course      Final Clinical Impressions(s) / UC Diagnoses   Final diagnoses:  Superficial phlebitis of arm  Right arm pain    New Prescriptions New Prescriptions   INDOMETHACIN (INDOCIN) 50 MG CAPSULE    Take 1 capsule (50 mg total) by mouth 3 (three) times daily with meals.   INDOMETHACIN (INDOCIN) 50 MG CAPSULE    Take 1 capsule (50 mg total) by mouth 3 (three) times daily with meals.     Robyn Haber, MD 07/10/16 1316

## 2016-07-10 NOTE — ED Triage Notes (Signed)
Pt here for pain and  swelling to right FA area where she had an IV placed for procedure a few days ago. sts she is worried its a blood clot. area hard to touch.

## 2016-07-10 NOTE — Discharge Instructions (Signed)
You have a superficial phlebitis which should respond to anti-inflammatories and heat. Please return if you do not see improvement in the next 24 hours.

## 2016-07-16 ENCOUNTER — Emergency Department (HOSPITAL_COMMUNITY)
Admission: EM | Admit: 2016-07-16 | Discharge: 2016-07-16 | Disposition: A | Discharge status: Home / Self Care | Payer: Medicaid Other | Attending: Emergency Medicine | Admitting: Emergency Medicine

## 2016-07-16 ENCOUNTER — Emergency Department (HOSPITAL_COMMUNITY): Payer: Medicaid Other

## 2016-07-16 ENCOUNTER — Encounter (HOSPITAL_COMMUNITY): Payer: Self-pay | Admitting: Emergency Medicine

## 2016-07-16 DIAGNOSIS — N390 Urinary tract infection, site not specified: Secondary | ICD-10-CM | POA: Diagnosis not present

## 2016-07-16 DIAGNOSIS — R109 Unspecified abdominal pain: Secondary | ICD-10-CM | POA: Diagnosis present

## 2016-07-16 DIAGNOSIS — R319 Hematuria, unspecified: Secondary | ICD-10-CM

## 2016-07-16 DIAGNOSIS — F17210 Nicotine dependence, cigarettes, uncomplicated: Secondary | ICD-10-CM | POA: Insufficient documentation

## 2016-07-16 DIAGNOSIS — R1084 Generalized abdominal pain: Secondary | ICD-10-CM

## 2016-07-16 DIAGNOSIS — N200 Calculus of kidney: Secondary | ICD-10-CM | POA: Insufficient documentation

## 2016-07-16 LAB — COMPREHENSIVE METABOLIC PANEL
ALBUMIN: 4.1 g/dL (ref 3.5–5.0)
ALK PHOS: 61 U/L (ref 38–126)
ALT: 21 U/L (ref 14–54)
AST: 22 U/L (ref 15–41)
Anion gap: 10 (ref 5–15)
BILIRUBIN TOTAL: 0.4 mg/dL (ref 0.3–1.2)
BUN: 12 mg/dL (ref 6–20)
CALCIUM: 9.6 mg/dL (ref 8.9–10.3)
CO2: 21 mmol/L — ABNORMAL LOW (ref 22–32)
Chloride: 107 mmol/L (ref 101–111)
Creatinine, Ser: 0.87 mg/dL (ref 0.44–1.00)
GFR calc Af Amer: >60 mL/min (ref >60)
GFR calc non Af Amer: >60 mL/min (ref >60)
GLUCOSE: 119 mg/dL — AB (ref 65–99)
POTASSIUM: 3.6 mmol/L (ref 3.5–5.1)
Sodium: 138 mmol/L (ref 135–145)
TOTAL PROTEIN: 7.2 g/dL (ref 6.5–8.1)

## 2016-07-16 LAB — URINALYSIS, ROUTINE W REFLEX MICROSCOPIC
Glucose, UA: 100 mg/dL — AB
KETONES UR: 15 mg/dL — AB
NITRITE: POSITIVE — AB
Specific Gravity, Urine: 1.023 (ref 1.005–1.030)
pH: 5.5 (ref 5.0–8.0)

## 2016-07-16 LAB — CBC
HCT: 42.5 % (ref 36.0–46.0)
Hemoglobin: 13.9 g/dL (ref 12.0–15.0)
MCH: 30 pg (ref 26.0–34.0)
MCHC: 32.7 g/dL (ref 30.0–36.0)
MCV: 91.8 fL (ref 78.0–100.0)
PLATELETS: 390 10*3/uL (ref 150–400)
RBC: 4.63 MIL/uL (ref 3.87–5.11)
RDW: 13.9 % (ref 11.5–15.5)
WBC: 13.5 10*3/uL — AB (ref 4.0–10.5)

## 2016-07-16 LAB — URINE MICROSCOPIC-ADD ON

## 2016-07-16 LAB — I-STAT BETA HCG BLOOD, ED (MC, WL, AP ONLY)

## 2016-07-16 LAB — LIPASE, BLOOD: Lipase: 18 U/L (ref 11–51)

## 2016-07-16 MED ORDER — HYDROMORPHONE HCL 1 MG/ML IJ SOLN
0.5000 mg | Freq: Once | INTRAMUSCULAR | Status: AC
  Administered 2016-07-16: 0.5 mg via INTRAVENOUS
  Filled 2016-07-16: qty 1

## 2016-07-16 MED ORDER — OXYBUTYNIN CHLORIDE 5 MG PO TABS: 5.0000 mg | ORAL_TABLET | Freq: Three times a day (TID) | ORAL | 0 refills | Status: DC

## 2016-07-16 MED ORDER — ONDANSETRON HCL 4 MG/2ML IJ SOLN
4.0000 mg | Freq: Once | INTRAMUSCULAR | Status: AC
  Administered 2016-07-16: 4 mg via INTRAVENOUS
  Filled 2016-07-16: qty 2

## 2016-07-16 MED ORDER — FAMOTIDINE IN NACL 20-0.9 MG/50ML-% IV SOLN
20.0000 mg | Freq: Once | INTRAVENOUS | Status: DC
  Filled 2016-07-16: qty 50

## 2016-07-16 MED ORDER — KETOROLAC TROMETHAMINE 30 MG/ML IJ SOLN
30.0000 mg | Freq: Once | INTRAMUSCULAR | Status: AC
  Administered 2016-07-16: 30 mg via INTRAVENOUS
  Filled 2016-07-16: qty 1

## 2016-07-16 MED ORDER — DEXTROSE 5 % IV SOLN
1.0000 g | Freq: Once | INTRAVENOUS | Status: AC
  Administered 2016-07-16: 1 g via INTRAVENOUS
  Filled 2016-07-16: qty 10

## 2016-07-16 MED ORDER — GI COCKTAIL ~~LOC~~
30.0000 mL | Freq: Once | ORAL | Status: AC
  Administered 2016-07-16: 30 mL via ORAL
  Filled 2016-07-16: qty 30

## 2016-07-16 MED ORDER — LORAZEPAM 2 MG/ML IJ SOLN
1.0000 mg | Freq: Once | INTRAMUSCULAR | Status: AC
  Administered 2016-07-16: 1 mg via INTRAVENOUS
  Filled 2016-07-16: qty 1

## 2016-07-16 MED ORDER — CEPHALEXIN 500 MG PO CAPS: 500.0000 mg | ORAL_CAPSULE | Freq: Four times a day (QID) | ORAL | 0 refills | Status: DC

## 2016-07-16 MED ORDER — FENTANYL CITRATE (PF) 100 MCG/2ML IJ SOLN: 50.0000 ug | Freq: Once | INTRAMUSCULAR | Status: DC

## 2016-07-16 NOTE — Care Management Note (Signed)
Case Management Note  Patient Details  Name: Jeanne Mcneil MRN: [id number] Date of Birth: [birth year]  Subjective/Objective:  30 y.o F with a long hx of EDV for UTI and GI difficulties. Of note pt has a folllow-up appt with Family Medicine Monday 07/17/2016 which she plans to attend. CM consulted to assist with medications as pt states she has absolutely "no money" to pay for any prescriptions she might receive while in the ED. 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 CM spoke to pt about the importance of a PCP and their ability to manage issues such as the one she is having vs the EDP. Pt and young child at bedside not happy with the direction of this conversation and more concerned that she is not to be admitted.                  Action/Plan: [place]  ED CM consulted by EDP, Audie Pinto for medication assistance   CM reviewed EPIC notes and chart review information CM spoke with the pt about [facility] MATCH program ($3 co pay for each Rx through [facility] program, does not include refills, 7 day expiration of MATCH letter and choice of pharmacies) Pt agreed to receive assistance from program. Pt unable to afford $3.00 co-pay at this time and so it was waived to prevent another EDV.     Pt is eligible for [facility] MATCH program (unable to find pt listed in PDMI per cardholder name inquiry)  PDMI information entered. [place] letter completed and provided to pt.   CM updated EDP and ED RN   Pending confirmation of d/c medication list     Expected Discharge Date:                  Expected Discharge Plan:  Home/Self Care  In-House Referral:     Discharge planning Services  CM Consult, [place] Program  Post Acute Care Choice:  NA Choice offered to:  Patient  DME Arranged:  N/A DME Agency:     HH Arranged:  NA HH Agency:  NA  Status of Service:  Completed, signed off  If discussed at [place] of Stay Meetings, dates discussed:    Additional Comments:  Delrae Sawyers, RN 07/16/2016, 3:14  PM

## 2016-07-16 NOTE — ED Triage Notes (Signed)
Pt states she had a stent placed on the 18th at [facility]. Today is having epigastric pain and also reports vaginal bleeding, pt states this is time for her "normal period but bleeding is worse today".

## 2016-07-16 NOTE — ED Notes (Signed)
Patient returned from CT

## 2016-07-16 NOTE — Discharge Instructions (Signed)
Follow up with GI and Urology as soon as possible. Take medications as prescribed.

## 2016-07-16 NOTE — ED Notes (Signed)
Patient transported to CT 

## 2016-07-16 NOTE — ED Notes (Signed)
Patient refused to sign for discharge because she wanted to be admitted, but she does verbally acknowledge that she received the paperwork and prescriptions and understands the instructions.

## 2016-07-16 NOTE — ED Provider Notes (Signed)
[facility] DEPT Provider Note   CSN: [id number] Arrival date & time: 07/16/16  3254     History   Chief Complaint Chief Complaint  Patient presents with  . Abdominal Pain    HPI  History obtained from patient, daughter, and EMR review.  Jeanne Mcneil is an 30 y.o. female with history of chronic abdominal pain, n/v, gastritis, reflux, recurrent kidney stones, recently discharged on 07/07/16 from [facility] after admission for UTI with ureteral stone s/p cystoscopy and right ureteral stent placement. She reports initially after discharge had done well and was asymptomatic. She presents to the ED today for evaluation of severe abdominal pain, nausea, and vomiting that woke her from sleep this morning. History is limited as pt is screaming, writhing, sobbing in room and is yelling at me our entire conversation. She states "my stomach hurts all over" and then ran to the rest room where she began dry heaving. Upon return to the room pt is demanding pain medicine and saying she needs some "G-D help" and has been "waiting for f-ing ever." She states she thinks her insides are being tore up. She does state she has blood in her urine and is having vaginal bleeding. Denies BRBPR or black tarry stools. She states it is time for her period right now. At this point she refuses to give anymore history. Her daughter states that pt has been taking her home meds including buspar, zoloft, and tramadol as prescribed with no relief in symptoms.   On EMR review she has been seen several times in the last several months for similar presentation. She was referred to GI and in June had EGD that revealed grade A reflux esophagitis with mild gastritis, likely causing her epigastric pain, nausea, and vomiting.   EGD 05/17/16 HEMATEMESIS DUE TO LA Grade A reflux esophagitis. - MILD Gastritis. - Medium-sized hiatal hernia. - NAUSEA/VOMITING AND EPIGASTRIC PAIN DUE TO REFLUX  Past Medical History:  Diagnosis Date   . Fibroid uterus    fibroid lymphoma  . Gastritis   . Glaucoma   . Hypercalcuria     Patient Active Problem List   Diagnosis Date Noted  . Ureteral stone 07/04/2016  . UTI (urinary tract infection) 07/04/2016  . Hydronephrosis of right kidney 07/04/2016  . GERD (gastroesophageal reflux disease) 07/04/2016  . Ureteral calculi 07/04/2016  . Abdominal pain, chronic, epigastric   . Hematemesis 05/16/2016  . Heme + stool 05/16/2016  . Abdominal pain, epigastric 05/16/2016    Past Surgical History:  Procedure Laterality Date  . CYSTOSCOPY W/ URETERAL STENT PLACEMENT Right 07/04/2016   Procedure: CYSTOSCOPY WITH RIGHT RETROGRADE PYELOGRAM, RIGHT URETERAL STENT PLACEMENT;  Surgeon: Ardis Hughs, MD;  Location: [facility] ORS;  Service: Urology;  Laterality: Right;  . ESOPHAGOGASTRODUODENOSCOPY N/A 05/17/2016   Procedure: ESOPHAGOGASTRODUODENOSCOPY (EGD);  Surgeon: Danie Binder, MD;  Location: [facility] ENDO SUITE;  Service: Endoscopy;  Laterality: N/A;  830   . kidney stents      OB History    Gravida Para Term Preterm AB Living   3 2 2   1 2    SAB TAB Ectopic Multiple Live Births   1       2       Home Medications    Prior to Admission medications   Medication Sig Start Date End Date Taking? Authorizing Provider  busPIRone (BUSPAR) 5 MG tablet Take 5 mg by mouth 2 (two) times daily.   Yes Historical Provider, MD  indomethacin (INDOCIN) 50 MG  capsule Take 1 capsule (50 mg total) by mouth 3 (three) times daily with meals. 07/10/16  Yes Robyn Haber, MD  sertraline (ZOLOFT) 50 MG tablet Take 50 mg by mouth daily.   Yes Historical Provider, MD  traMADol (ULTRAM) 50 MG tablet Take 1 tablet (50 mg total) by mouth daily. 07/07/16  Yes Hosie Poisson, MD  polyethylene glycol (MIRALAX / GLYCOLAX) packet Take 17 g by mouth daily. Patient not taking: Reported on 07/16/2016 07/07/16   Hosie Poisson, MD  senna-docusate (SENOKOT-S) 8.6-50 MG tablet Take 2 tablets by mouth 2 (two) times  daily. Patient not taking: Reported on 07/16/2016 07/07/16   Hosie Poisson, MD    Family History Family History  Problem Relation Age of Onset  . Colon cancer Paternal Uncle     Passed away 29-Jan-2015    Social History Social History  Substance Use Topics  . Smoking status: Current Every Day Smoker    Packs/day: 0.20    Types: Cigarettes  . Smokeless tobacco: Never Used     Comment: 3-4 cigarettes daily  . Alcohol use 2.4 oz/week    4 Shots of liquor per week     Comment: occasionally, once every 2-3 months.     Allergies   Codeine; Percocet [oxycodone-acetaminophen]; and Vicodin [hydrocodone-acetaminophen]   Review of Systems Review of Systems  Unable to perform ROS: Other (pt uncooperative)     Physical Exam Updated Vital Signs BP 157/77 (BP Location: Right Arm)   Pulse 78   Temp 98.1 F (36.7 C) (Oral)   Resp 16   Ht 5' 2"  (1.575 m)   Wt 68 kg   LMP 07/16/2016   SpO2 100%   BMI 27.44 kg/m   Physical Exam  Constitutional: She is oriented to person, place, and time. No distress.  Pt sobbing, screaming, yelling at me. At times dry heaving in bathroom.  HENT:  Head: Atraumatic.  Right Ear: External ear normal.  Left Ear: External ear normal.  Nose: Nose normal.  Eyes: Conjunctivae are normal. No scleral icterus.  Cardiovascular: Normal rate and regular rhythm.   Pulmonary/Chest: Effort normal. No respiratory distress.  Abdominal: Soft. She exhibits no distension.  Abdomen seems diffusely tender though difficult to localize tenderness. +R CVA tenderness.   Neurological: She is alert and oriented to person, place, and time.  Skin: Skin is warm and dry. She is not diaphoretic.  Psychiatric: She has a normal mood and affect. Her behavior is normal.  Nursing note and vitals reviewed.    ED Treatments / Results  Labs (all labs ordered are listed, but only abnormal results are displayed) Labs Reviewed  COMPREHENSIVE METABOLIC PANEL - Abnormal; Notable for the  following:       Result Value   CO2 21 (*)    Glucose, Bld 119 (*)    All other components within normal limits  CBC - Abnormal; Notable for the following:    WBC 13.5 (*)    All other components within normal limits  URINALYSIS, ROUTINE W REFLEX MICROSCOPIC (NOT AT [facility]) - Abnormal; Notable for the following:    Color, Urine RED (*)    APPearance TURBID (*)    Glucose, UA 100 (*)    Hgb urine dipstick LARGE (*)    Bilirubin Urine LARGE (*)    Ketones, ur 15 (*)    Protein, ur >300 (*)    Nitrite POSITIVE (*)    Leukocytes, UA LARGE (*)    All other components within normal limits  URINE MICROSCOPIC-ADD ON - Abnormal; Notable for the following:    Squamous Epithelial / LPF 0-5 (*)    Bacteria, UA MANY (*)    Casts GRANULAR CAST (*)    All other components within normal limits  URINE CULTURE  LIPASE, BLOOD  I-STAT BETA HCG BLOOD, ED ([facility], [facility], [facility] ONLY)    EKG  EKG Interpretation None       Radiology Ct Renal Stone Study  Result Date: 07/16/2016 CLINICAL DATA:  Stomach pain EXAM: CT ABDOMEN AND PELVIS WITHOUT CONTRAST TECHNIQUE: Multidetector CT imaging of the abdomen and pelvis was performed following the standard protocol without IV contrast. COMPARISON:  07/04/2016 FINDINGS: Lower chest:  No acute findings. Hepatobiliary: No mass visualized on this un-enhanced exam. Pancreas: No mass or inflammatory process identified on this un-enhanced exam. Spleen: Within normal limits in size. Adrenals/Urinary Tract: Normal adrenal glands. Bilateral renal calculi are again noted. Left renal stone measures 5 mm and is unchanged from previous exam. Mild to moderate right hydronephrosis is identified. There has been interval placement of a right ureteral calculus. The previously noted right UPJ calculus is again noted measuring 3 mm, image 27 of series 2. The urinary bladder appears normal. Stomach/Bowel: The stomach is within normal limits. The small bowel loops have a normal course and  caliber. No obstruction. Normal appearance of the colon. The appendix is visualized and appears unremarkable. Vascular/Lymphatic: Normal appearance of the abdominal aorta. No enlarged retroperitoneal or mesenteric adenopathy. No enlarged pelvic or inguinal lymph nodes. Reproductive: No mass or other significant abnormality. Other: Small amount of ascites identified within the dependent portion of the pelvis. Musculoskeletal: Degenerative disc disease is noted at the L5-S1 level. IMPRESSION: 1. Interval placement of right ureteral stone. 2. Persistent stone within the proximal right ureter. 3. Left renal calculi without mass or hydronephrosis. Electronically Signed   By: Kerby Moors M.D.   On: 07/16/2016 13:38    Procedures Procedures (including critical care time)  Medications Ordered in ED Medications  ondansetron (ZOFRAN) injection 4 mg (4 mg Intravenous Given 07/16/16 1150)  ketorolac (TORADOL) 30 MG/ML injection 30 mg (30 mg Intravenous Given 07/16/16 1150)  HYDROmorphone (DILAUDID) injection 0.5 mg (0.5 mg Intravenous Given 07/16/16 1150)  LORazepam (ATIVAN) injection 1 mg (1 mg Intravenous Given 07/16/16 1150)  gi cocktail (Maalox,Lidocaine,Donnatal) (30 mLs Oral Given 07/16/16 1202)  HYDROmorphone (DILAUDID) injection 0.5 mg (0.5 mg Intravenous Given 07/16/16 1257)  cefTRIAXone (ROCEPHIN) 1 g in dextrose 5 % 50 mL IVPB (0 g Intravenous Stopped 07/16/16 1330)     Initial Impression / Assessment and Plan / ED Course  I have reviewed the triage vital signs and the nursing notes.  Pertinent labs & imaging results that were available during my care of the patient were reviewed by me and considered in my medical decision making (see chart for details).  Clinical Course   12:33 PM After pain meds and anti-emetics pt is much more calm and reasonable on re-eval. She now states it feels like her stent has moved. She localizes her pain to her suprapubic and periumbilical areas. Endorses dysuria,  diarrhea, and urinary hesitancy. On repeat exam she has some suprapubic tenderness and right CVA tenderness. Her bloodwork reveals a WBC of 13,500 otherwise unrevealing. UA is still pending but I saw the sample and it was grossly red, which pt states is new since her discharge. She states she was originally scheduled to f/u with [facility] in [place] last week but due to an office emergency she  is now rescheduled to early September but she feels she cannot wait that long. On EMR review pt treated with Rocephin switched to [facility] during her [facility] admission for UTI. Pt states she was not told she was discharged on anything and has not been taking any antibiotics since discharge. She is taking colchicine which urgent care prescribed for phlebitis.   1:11 PM UA with nitrite positive infection and TNTC RBC and WBC. Will order dose of rocephin and obtain CT renal study. Anticipate urology consultation with possible admission.   2:21 PM CT reveals interval placement of stent and persistent stone, hydronephrosis. I spoke with Dr. Pilar Jarvis of urology who recommends outpatient antibiotic therapy and rx for ditropan to help with pain, f/u with urology outpatient as scheduled. States that UA is not reliable in stented patients. Appreciate assistance. I spoke with pt to discuss urology recs. She states she has zero money and cannot afford any kind of outpatient meds, even if they are on the $4 list. At this point nausea and pain well controlled. I will consult CM for medication assistance.  CM provided MATCH for keflex and ditropan. Despite our conversation earlier about urology's recommendations for outpatient therapy and follow up pt is again now upset that we are not admitting her. She is insistent that somebody needs to "look inside" her. She does state she has PCP and GI follow up this week in addition to urology follow up next week. She is hemodynamically stable with no evidence of emergent situation requiring  further evaluation. Again reiterated I have no indication for admission at this time, and we have controlled her pain/nausea, and provided resources on how to obtain the necessary medications for free.    Final Clinical Impressions(s) / ED Diagnoses   Final diagnoses:  Generalized abdominal pain  Urinary tract infection with hematuria, site unspecified  Nephrolithiasis    New Prescriptions Discharge Medication List as of 07/16/2016  4:54 PM    START taking these medications   Details  cephALEXin (KEFLEX) 500 MG capsule Take 1 capsule (500 mg total) by mouth 4 (four) times daily., Starting Sun 07/16/2016, Print    oxybutynin (DITROPAN) 5 MG tablet Take 1 tablet (5 mg total) by mouth 3 (three) times daily., Starting Sun 07/16/2016, Print         Anne Ng, PA-C 07/16/16 1712    Leonard Schwartz, MD 07/17/16 [phone]

## 2016-07-17 ENCOUNTER — Ambulatory Visit: Payer: Self-pay | Admitting: Nurse Practitioner

## 2016-07-17 ENCOUNTER — Encounter: Payer: Self-pay | Admitting: Obstetrics and Gynecology

## 2016-07-18 ENCOUNTER — Encounter (HOSPITAL_COMMUNITY): Payer: Self-pay | Admitting: *Deleted

## 2016-07-18 ENCOUNTER — Encounter (HOSPITAL_COMMUNITY): Payer: Self-pay | Admitting: Emergency Medicine

## 2016-07-18 ENCOUNTER — Emergency Department (HOSPITAL_COMMUNITY)
Admission: EM | Admit: 2016-07-18 | Discharge: 2016-07-19 | Disposition: A | Discharge status: Home / Self Care | Payer: Medicaid Other | Attending: Emergency Medicine | Admitting: Emergency Medicine

## 2016-07-18 ENCOUNTER — Inpatient Hospital Stay (EMERGENCY_DEPARTMENT_HOSPITAL)
Admission: AD | Admit: 2016-07-18 | Discharge: 2016-07-18 | Discharge status: Against Medical Advice | Payer: Medicaid Other | Source: Ambulatory Visit | Attending: Obstetrics & Gynecology | Admitting: Obstetrics & Gynecology

## 2016-07-18 DIAGNOSIS — F419 Anxiety disorder, unspecified: Secondary | ICD-10-CM | POA: Insufficient documentation

## 2016-07-18 DIAGNOSIS — N201 Calculus of ureter: Secondary | ICD-10-CM

## 2016-07-18 DIAGNOSIS — Z792 Long term (current) use of antibiotics: Secondary | ICD-10-CM | POA: Diagnosis not present

## 2016-07-18 DIAGNOSIS — R1084 Generalized abdominal pain: Secondary | ICD-10-CM | POA: Insufficient documentation

## 2016-07-18 DIAGNOSIS — Z5321 Procedure and treatment not carried out due to patient leaving prior to being seen by health care provider: Secondary | ICD-10-CM | POA: Insufficient documentation

## 2016-07-18 DIAGNOSIS — Z79899 Other long term (current) drug therapy: Secondary | ICD-10-CM | POA: Diagnosis not present

## 2016-07-18 DIAGNOSIS — R112 Nausea with vomiting, unspecified: Secondary | ICD-10-CM

## 2016-07-18 DIAGNOSIS — R1013 Epigastric pain: Secondary | ICD-10-CM

## 2016-07-18 DIAGNOSIS — G8929 Other chronic pain: Secondary | ICD-10-CM | POA: Diagnosis not present

## 2016-07-18 DIAGNOSIS — F17210 Nicotine dependence, cigarettes, uncomplicated: Secondary | ICD-10-CM | POA: Insufficient documentation

## 2016-07-18 DIAGNOSIS — R109 Unspecified abdominal pain: Secondary | ICD-10-CM | POA: Insufficient documentation

## 2016-07-18 LAB — URINE CULTURE: Culture: <10000 — AB

## 2016-07-18 LAB — COMPREHENSIVE METABOLIC PANEL
ALT: 21 U/L (ref 14–54)
AST: 25 U/L (ref 15–41)
Albumin: 4.4 g/dL (ref 3.5–5.0)
Alkaline Phosphatase: 61 U/L (ref 38–126)
Anion gap: 15 (ref 5–15)
BILIRUBIN TOTAL: 0.5 mg/dL (ref 0.3–1.2)
BUN: 17 mg/dL (ref 6–20)
CHLORIDE: 105 mmol/L (ref 101–111)
CO2: 18 mmol/L — AB (ref 22–32)
Calcium: 9.4 mg/dL (ref 8.9–10.3)
Creatinine, Ser: 0.84 mg/dL (ref 0.44–1.00)
GLUCOSE: 148 mg/dL — AB (ref 65–99)
POTASSIUM: 3.1 mmol/L — AB (ref 3.5–5.1)
Sodium: 138 mmol/L (ref 135–145)
Total Protein: 7.4 g/dL (ref 6.5–8.1)

## 2016-07-18 LAB — CBC
HEMATOCRIT: 39.9 % (ref 36.0–46.0)
HEMOGLOBIN: 13.5 g/dL (ref 12.0–15.0)
MCH: 29.8 pg (ref 26.0–34.0)
MCHC: 33.8 g/dL (ref 30.0–36.0)
MCV: 88.1 fL (ref 78.0–100.0)
Platelets: 385 10*3/uL (ref 150–400)
RBC: 4.53 MIL/uL (ref 3.87–5.11)
RDW: 13.4 % (ref 11.5–15.5)
WBC: 18.6 10*3/uL — ABNORMAL HIGH (ref 4.0–10.5)

## 2016-07-18 LAB — URINALYSIS, ROUTINE W REFLEX MICROSCOPIC
GLUCOSE, UA: 100 mg/dL — AB
Glucose, UA: NEGATIVE mg/dL
Ketones, ur: 40 mg/dL — AB
Ketones, ur: >80 mg/dL — AB
NITRITE: POSITIVE — AB
Nitrite: POSITIVE — AB
PH: 8 (ref 5.0–8.0)
Protein, ur: >300 mg/dL — AB
SPECIFIC GRAVITY, URINE: 1.029 (ref 1.005–1.030)
Specific Gravity, Urine: 1.025 (ref 1.005–1.030)
pH: 7.5 (ref 5.0–8.0)

## 2016-07-18 LAB — URINE MICROSCOPIC-ADD ON

## 2016-07-18 LAB — POCT PREGNANCY, URINE: PREG TEST UR: NEGATIVE

## 2016-07-18 LAB — LIPASE, BLOOD: Lipase: 18 U/L (ref 11–51)

## 2016-07-18 LAB — POC URINE PREG, ED: Preg Test, Ur: NEGATIVE

## 2016-07-18 MED ORDER — ONDANSETRON HCL 4 MG/2ML IJ SOLN
4.0000 mg | Freq: Once | INTRAMUSCULAR | Status: AC | PRN
  Administered 2016-07-18: 4 mg via INTRAVENOUS
  Filled 2016-07-18: qty 2

## 2016-07-18 MED ORDER — LORAZEPAM 2 MG/ML IJ SOLN
1.0000 mg | Freq: Once | INTRAMUSCULAR | Status: AC
  Administered 2016-07-18: 1 mg via INTRAVENOUS
  Filled 2016-07-18: qty 1

## 2016-07-18 MED ORDER — HALOPERIDOL LACTATE 5 MG/ML IJ SOLN
5.0000 mg | Freq: Once | INTRAMUSCULAR | Status: AC
  Administered 2016-07-18: 5 mg via INTRAVENOUS
  Filled 2016-07-18: qty 1

## 2016-07-18 MED ORDER — OXYCODONE-ACETAMINOPHEN 5-325 MG PO TABS
1.0000 | ORAL_TABLET | Freq: Once | ORAL | Status: AC
  Administered 2016-07-18: 1 via ORAL
  Filled 2016-07-18: qty 1

## 2016-07-18 NOTE — ED Triage Notes (Signed)
Patient presents via [facility] from [facility] ED with complaints of epigastric and right sided abd pain, N/V/D since 6pm tonight. EMS reports that patient was dropped off at [facility] by her dad and then refused treatment stating she wanted to go to a different hospital. Pt has hx of chronic abd pain and stent placement in right kidney.

## 2016-07-18 NOTE — ED Notes (Signed)
Patient stating that she has pain in her pubic area. States that she has been "bleeding down there" since she had a stent placed in her right kidney. Patient also reports that she is unable to tell if she has any discharge due to bleeding.

## 2016-07-18 NOTE — MAU Note (Signed)
Pt yelling, rocking back and forth, not cooperating.  Pt would not allow BP cuff to obtain VS, she is yelling tightening up her arm.  BP cuff unable to obtain BP.  HR by pulse ox is in the 50's.  Pt not cooperating answering hx.  RN got Dr. Harolyn Rutherford to come to bedside to assess pt.  Pt not yelling at MD stating she wants another doctor to see her that something is wrong.  Pt had work up at [employer] 2 days ago.  Dr. Harolyn Rutherford asked if she was concerned about STD's which we could evaluate for but pt continued to yell at the doctor and states she hasn't been sexually active for 4 months.  Pt called EMS to take her to another facility and refused any further treatment here at [facility].  Pt met [facility] at stretcher in hall fully dressed and sat on stretcher.

## 2016-07-18 NOTE — MAU Provider Note (Signed)
  [facility] MAU Attending Note  Subjective:  30 y.o. [id number] female who presented to MAU for severe abdominal pain, nausea and vomiting. Had extensive evaluation in [facility] ED on 07/16/16, please see the ED provider notes for more details.  She is very belligerent and asking for a doctor. When I came in to see her, she was demanding having "more stuff done".  She was moving around in bed so much, we were unable to take her vital signs.     Objective:  Pulse (!) 59, temperature 97.6 F (36.4 C), temperature source Oral, resp. rate 22, last menstrual period 07/16/2016, SpO2 100 %.  Unable to take BP giving her constant movement GENERAL: Very upset, belligerent and screaming  LUNGS: Normal breath sounds SKIN: Warm, dry and without erythema ABDOMEN: Unable to examine her due to her attitude PELVIC: Unable to examine her due to her attitude  MAU Course Tried to talk to patient and explain that there is no other evaluation that can be done here that was not done at her 07/16/16 ER visit.  No acute problems, no OB/GYN problems. Tried to examine her, but she refused and was very angry. She asked for another doctor, I told her I was the only doctor in the MAU tonight.   Patient was screaming and belligerent, demanded to be admitted to hospital and more "stuff done now. When I told her nothing could be done without evaluation, she demanded transport to [facility] ER. She was told that she can get there by private transportation, there was no need for emergent transportation.  Patient proceeded to call EMS from her room.   Assessment & Plan:  Patient left AMA after calling EMS from her room. EMS came and picked her up, likely heading to [facility] ER [facility] ER staff notified    Verita Schneiders, MD, [place] Attending [place], [facility]

## 2016-07-18 NOTE — MAU Note (Signed)
Pt states she started having abd pain with vaginal bleeding at 1800.  Pt states she is not pregnant.  Feels like she constantly has to pee

## 2016-07-19 ENCOUNTER — Encounter: Payer: Self-pay | Admitting: Obstetrics & Gynecology

## 2016-07-19 NOTE — ED Provider Notes (Signed)
[facility] DEPT Provider Note   CSN: [id number] Arrival date & time: 07/18/16  2111     History   Chief Complaint Chief Complaint  Patient presents with  . Abdominal Pain    HPI Jeanne Mcneil is a 30 y.o. female.  30yo F w/ PMH including kidney stones and chronic abdominal pain who Presents with abdominal pain. The patient presented here 2 days ago for generalized severe abdominal pain associated with nausea and vomiting. Her evaluation included a CT of the abdomen and pelvis which was reassuring. Patient was discharged home with Keflex and Ditropan which she states she has been taking. She reports that her abdominal pain has not improved. She denies any cough/cold symptoms. She reports ongoing hematuria. She went to the [facility] prior to leaving [place] to come here and states that her temperature was 106 there.   The history is provided by the patient.  Abdominal Pain      Past Medical History:  Diagnosis Date  . Fibroid uterus    fibroid lymphoma  . Gastritis   . Glaucoma   . Hypercalcuria   . Kidney stones     Patient Active Problem List   Diagnosis Date Noted  . Ureteral stone 07/04/2016  . UTI (urinary tract infection) 07/04/2016  . Hydronephrosis of right kidney 07/04/2016  . GERD (gastroesophageal reflux disease) 07/04/2016  . Ureteral calculi 07/04/2016  . Abdominal pain, chronic, epigastric   . Hematemesis 05/16/2016  . Heme + stool 05/16/2016  . Abdominal pain, epigastric 05/16/2016    Past Surgical History:  Procedure Laterality Date  . CYSTOSCOPY W/ URETERAL STENT PLACEMENT Right 07/04/2016   Procedure: CYSTOSCOPY WITH RIGHT RETROGRADE PYELOGRAM, RIGHT URETERAL STENT PLACEMENT;  Surgeon: Ardis Hughs, MD;  Location: [facility];  Service: Urology;  Laterality: Right;  . ESOPHAGOGASTRODUODENOSCOPY N/A 05/17/2016   Procedure: ESOPHAGOGASTRODUODENOSCOPY (EGD);  Surgeon: Danie Binder, MD;  Location: [facility];  Service: Endoscopy;   Laterality: N/A;  830   . kidney stents      OB History    Gravida Para Term Preterm AB Living   3 2 2   1 2    SAB TAB Ectopic Multiple Live Births   1       2       Home Medications    Prior to Admission medications   Medication Sig Start Date End Date Taking? Authorizing Provider  busPIRone (BUSPAR) 5 MG tablet Take 5 mg by mouth 2 (two) times daily.    Historical Provider, MD  cephALEXin (KEFLEX) 500 MG capsule Take 1 capsule (500 mg total) by mouth 4 (four) times daily. 07/16/16   Olivia Canter Sam, PA-C  indomethacin (INDOCIN) 50 MG capsule Take 1 capsule (50 mg total) by mouth 3 (three) times daily with meals. 07/10/16   Robyn Haber, MD  oxybutynin (DITROPAN) 5 MG tablet Take 1 tablet (5 mg total) by mouth 3 (three) times daily. 07/16/16   Olivia Canter Sam, PA-C  polyethylene glycol (MIRALAX / GLYCOLAX) packet Take 17 g by mouth daily. Patient not taking: Reported on 07/16/2016 07/07/16   Hosie Poisson, MD  senna-docusate (SENOKOT-S) 8.6-50 MG tablet Take 2 tablets by mouth 2 (two) times daily. Patient not taking: Reported on 07/16/2016 07/07/16   Hosie Poisson, MD  sertraline (ZOLOFT) 50 MG tablet Take 50 mg by mouth daily.    Historical Provider, MD  traMADol (ULTRAM) 50 MG tablet Take 1 tablet (50 mg total) by mouth daily. 07/07/16   Hosie Poisson,  MD    Family History Family History  Problem Relation Age of Onset  . Colon cancer Paternal Uncle     Passed away 01-24-2015    Social History Social History  Substance Use Topics  . Smoking status: Current Every Day Smoker    Packs/day: 0.25    Types: Cigarettes  . Smokeless tobacco: Never Used     Comment: 3-4 cigarettes daily  . Alcohol use 2.4 oz/week    4 Shots of liquor per week     Comment: occasionally, once every 2-3 months.     Allergies   Codeine; Percocet [oxycodone-acetaminophen]; and Vicodin [hydrocodone-acetaminophen]   Review of Systems Review of Systems  Gastrointestinal: Positive for abdominal pain.   10  Systems reviewed and are negative for acute change except as noted in the HPI.   Physical Exam Updated Vital Signs BP (!) 159/119 (BP Location: Left Arm)   Pulse 60   Temp 98.2 F (36.8 C) (Oral)   Resp 18   Ht 5' 2"  (1.575 m)   Wt 150 lb (68 kg)   LMP 07/16/2016 (Exact Date)   SpO2 100%   BMI 27.44 kg/m   Physical Exam  Constitutional: She is oriented to person, place, and time. She appears well-developed and well-nourished. She appears distressed.  Anxious, holding abdomen, moaning  HENT:  Head: Normocephalic and atraumatic.  Moist mucous membranes  Eyes: Conjunctivae are normal. Pupils are equal, round, and reactive to light.  Neck: Neck supple.  Cardiovascular: Normal rate, regular rhythm and normal heart sounds.   No murmur heard. Pulmonary/Chest: Effort normal and breath sounds normal.  Abdominal: Soft. Bowel sounds are normal. She exhibits no distension. There is tenderness. There is no rebound and no guarding.  gneralized abdominal tenderness without focal pain, distractable during exam  Musculoskeletal: She exhibits no edema.  Neurological: She is alert and oriented to person, place, and time.  Fluent speech  Skin: Skin is warm and dry.  Psychiatric: She has a normal mood and affect. Judgment normal.  Nursing note and vitals reviewed.    ED Treatments / Results  Labs (all labs ordered are listed, but only abnormal results are displayed) Labs Reviewed  COMPREHENSIVE METABOLIC PANEL - Abnormal; Notable for the following:       Result Value   Potassium 3.1 (*)    CO2 18 (*)    Glucose, Bld 148 (*)    All other components within normal limits  CBC - Abnormal; Notable for the following:    WBC 18.6 (*)    All other components within normal limits  URINALYSIS, ROUTINE W REFLEX MICROSCOPIC (NOT AT [facility]) - Abnormal; Notable for the following:    Color, Urine RED (*)    APPearance TURBID (*)    Glucose, UA 100 (*)    Hgb urine dipstick LARGE (*)    Bilirubin  Urine LARGE (*)    Ketones, ur >80 (*)    Protein, ur >300 (*)    Nitrite POSITIVE (*)    Leukocytes, UA LARGE (*)    All other components within normal limits  URINE MICROSCOPIC-ADD ON - Abnormal; Notable for the following:    Squamous Epithelial / LPF 0-5 (*)    Bacteria, UA MANY (*)    All other components within normal limits  LIPASE, BLOOD  POC URINE PREG, ED    EKG  EKG Interpretation None       Radiology No results found.  Procedures Procedures (including critical care time)  Medications Ordered  in ED Medications  ondansetron (ZOFRAN) injection 4 mg (4 mg Intravenous Given 07/18/16 2206)  haloperidol lactate (HALDOL) injection 5 mg (5 mg Intravenous Given 07/18/16 2315)  oxyCODONE-acetaminophen (PERCOCET/ROXICET) 5-325 MG per tablet 1 tablet (1 tablet Oral Given 07/18/16 2349)  LORazepam (ATIVAN) injection 1 mg (1 mg Intravenous Given 07/18/16 2350)     Initial Impression / Assessment and Plan / ED Course  I have reviewed the triage vital signs and the nursing notes.  Pertinent labs that were available during my care of the patient were reviewed by me and considered in my medical decision making (see chart for details).  Clinical Course    On exam, patient was very anxious, holding her abdomen, stating that she was freezing cold and demanding a blanket. Vital signs stable. She was distractible on abdominal exam with no focal abdominal tenderness. Her lab work here shows WBC 18000, however UA similar to UA 2 days ago and pt afebrile here and currently on adequate antibiotics for UTI since 2 days ago. She states she is compliant. Given her normal vital signs and otherwise reassuring labs, I doubt life-threatening intra-abdominal process and feel that she would not benefit from any further imaging since she had CT 2 days ago for the same pain. Gave the patient Haldol, later followed by Ativan and Percocet. On reexamination she was resting comfortably and stated her  abdominal pain was gone. Discussed importance of f/u with PCP and GI scheduled for this week and urology f/u scheduled soon. Patient discharged in satisfactory condition.  Final Clinical Impressions(s) / ED Diagnoses   Final diagnoses:  None    New Prescriptions New Prescriptions   No medications on file     Sharlett Iles, MD 07/19/16 [phone]

## 2016-07-20 ENCOUNTER — Emergency Department (HOSPITAL_COMMUNITY)
Admission: EM | Admit: 2016-07-20 | Discharge: 2016-07-20 | Disposition: A | Discharge status: Home / Self Care | Payer: Medicaid Other | Attending: Emergency Medicine | Admitting: Emergency Medicine

## 2016-07-20 ENCOUNTER — Encounter (HOSPITAL_COMMUNITY): Payer: Self-pay | Admitting: Emergency Medicine

## 2016-07-20 DIAGNOSIS — R109 Unspecified abdominal pain: Secondary | ICD-10-CM | POA: Diagnosis present

## 2016-07-20 DIAGNOSIS — R1115 Cyclical vomiting syndrome unrelated to migraine: Secondary | ICD-10-CM

## 2016-07-20 DIAGNOSIS — G43A1 Cyclical vomiting, intractable: Secondary | ICD-10-CM | POA: Diagnosis not present

## 2016-07-20 DIAGNOSIS — F17210 Nicotine dependence, cigarettes, uncomplicated: Secondary | ICD-10-CM | POA: Insufficient documentation

## 2016-07-20 LAB — COMPREHENSIVE METABOLIC PANEL
ALT: 20 U/L (ref 14–54)
AST: 29 U/L (ref 15–41)
Albumin: 4.5 g/dL (ref 3.5–5.0)
Alkaline Phosphatase: 61 U/L (ref 38–126)
Anion gap: 15 (ref 5–15)
BUN: 9 mg/dL (ref 6–20)
CHLORIDE: 104 mmol/L (ref 101–111)
CO2: 19 mmol/L — ABNORMAL LOW (ref 22–32)
Calcium: 9.6 mg/dL (ref 8.9–10.3)
Creatinine, Ser: 0.99 mg/dL (ref 0.44–1.00)
Glucose, Bld: 141 mg/dL — ABNORMAL HIGH (ref 65–99)
POTASSIUM: 3.4 mmol/L — AB (ref 3.5–5.1)
Sodium: 138 mmol/L (ref 135–145)
Total Bilirubin: 0.6 mg/dL (ref 0.3–1.2)
Total Protein: 7.3 g/dL (ref 6.5–8.1)

## 2016-07-20 LAB — URINALYSIS, ROUTINE W REFLEX MICROSCOPIC
GLUCOSE, UA: 100 mg/dL — AB
Ketones, ur: 40 mg/dL — AB
Nitrite: NEGATIVE
PH: 6 (ref 5.0–8.0)
Specific Gravity, Urine: 1.026 (ref 1.005–1.030)

## 2016-07-20 LAB — CBC
HEMATOCRIT: 41.6 % (ref 36.0–46.0)
Hemoglobin: 13.6 g/dL (ref 12.0–15.0)
MCH: 29.5 pg (ref 26.0–34.0)
MCHC: 32.7 g/dL (ref 30.0–36.0)
MCV: 90.2 fL (ref 78.0–100.0)
Platelets: 386 10*3/uL (ref 150–400)
RBC: 4.61 MIL/uL (ref 3.87–5.11)
RDW: 13.9 % (ref 11.5–15.5)
WBC: 16.9 10*3/uL — AB (ref 4.0–10.5)

## 2016-07-20 LAB — I-STAT BETA HCG BLOOD, ED (MC, WL, AP ONLY): I-stat hCG, quantitative: <5 m[IU]/mL (ref <5)

## 2016-07-20 LAB — URINE MICROSCOPIC-ADD ON

## 2016-07-20 LAB — LIPASE, BLOOD: LIPASE: 20 U/L (ref 11–51)

## 2016-07-20 MED ORDER — PROMETHAZINE HCL 25 MG/ML IJ SOLN
12.5000 mg | Freq: Once | INTRAMUSCULAR | Status: AC
  Administered 2016-07-20: 12.5 mg via INTRAVENOUS
  Filled 2016-07-20: qty 1

## 2016-07-20 MED ORDER — SODIUM CHLORIDE 0.9 % IV SOLN
INTRAVENOUS | Status: DC
  Administered 2016-07-20: 09:00:00 via INTRAVENOUS

## 2016-07-20 MED ORDER — PROMETHAZINE HCL 25 MG PO TABS: 25.0000 mg | ORAL_TABLET | Freq: Four times a day (QID) | ORAL | 1 refills | Status: DC | PRN

## 2016-07-20 MED ORDER — METOCLOPRAMIDE HCL 5 MG/ML IJ SOLN
10.0000 mg | Freq: Once | INTRAMUSCULAR | Status: AC
  Administered 2016-07-20: 10 mg via INTRAVENOUS
  Filled 2016-07-20: qty 2

## 2016-07-20 MED ORDER — ONDANSETRON HCL 4 MG/2ML IJ SOLN
4.0000 mg | Freq: Once | INTRAMUSCULAR | Status: AC | PRN
  Administered 2016-07-20: 4 mg via INTRAVENOUS
  Filled 2016-07-20: qty 2

## 2016-07-20 MED ORDER — SODIUM CHLORIDE 0.9 % IV BOLUS (SEPSIS)
1000.0000 mL | Freq: Once | INTRAVENOUS | Status: AC
  Administered 2016-07-20: 1000 mL via INTRAVENOUS

## 2016-07-20 MED ORDER — HALOPERIDOL LACTATE 5 MG/ML IJ SOLN
5.0000 mg | Freq: Once | INTRAMUSCULAR | Status: AC
  Administered 2016-07-20: 5 mg via INTRAVENOUS
  Filled 2016-07-20: qty 1

## 2016-07-20 MED ORDER — HYDROMORPHONE HCL 1 MG/ML IJ SOLN
1.0000 mg | Freq: Once | INTRAMUSCULAR | Status: AC
  Administered 2016-07-20: 1 mg via INTRAVENOUS
  Filled 2016-07-20: qty 1

## 2016-07-20 MED ORDER — FAMOTIDINE IN NACL 20-0.9 MG/50ML-% IV SOLN
20.0000 mg | Freq: Two times a day (BID) | INTRAVENOUS | Status: DC
  Administered 2016-07-20: 20 mg via INTRAVENOUS
  Filled 2016-07-20: qty 50

## 2016-07-20 NOTE — ED Notes (Signed)
MD at bedside. 

## 2016-07-20 NOTE — ED Notes (Signed)
Pt states that she has not vomited anymore

## 2016-07-20 NOTE — ED Triage Notes (Signed)
30 yo female began vomiting blood 330 this am she states that she has vomited about 15 times.  Pt states that she is having abdominal pain rated at 10 on a 0-10 scale. Pt has bruise on left lower back states that she hit the tub this morning.  Pt states that she has has bloody urine since she had her stent put in her kidney 07/07/16.

## 2016-07-20 NOTE — ED Provider Notes (Signed)
[facility] DEPT Provider Note   CSN: [id number] Arrival date & time: 07/20/16  0702     History   Chief Complaint Chief Complaint  Patient presents with  . Abdominal Pain    HPI Jeanne Mcneil is a 30 y.o. female.  The patient with fourth visit for same complaint this month. Patient with epigastric abdominal pain associated with multiple episodes of vomiting. Patient's symptoms started during the night. Patient vomited at least every 30 minutes. Occasional some streaks of blood but no pure blood. No diarrhea. Patient has referral to GI medicine but has not followed up with them yet. Patient denies fever. Patient had CT scan of the abdomen this month without evidence of any acute findings. Patient also had a recent stent placed by urology. This was 2 weeks ago. Patient's pain is not in the flank area.      Past Medical History:  Diagnosis Date  . Fibroid uterus    fibroid lymphoma  . Gastritis   . Glaucoma   . Hypercalcuria   . Kidney stones     Patient Active Problem List   Diagnosis Date Noted  . Ureteral stone 07/04/2016  . UTI (urinary tract infection) 07/04/2016  . Hydronephrosis of right kidney 07/04/2016  . GERD (gastroesophageal reflux disease) 07/04/2016  . Ureteral calculi 07/04/2016  . Abdominal pain, chronic, epigastric   . Hematemesis 05/16/2016  . Heme + stool 05/16/2016  . Abdominal pain, epigastric 05/16/2016    Past Surgical History:  Procedure Laterality Date  . CYSTOSCOPY W/ URETERAL STENT PLACEMENT Right 07/04/2016   Procedure: CYSTOSCOPY WITH RIGHT RETROGRADE PYELOGRAM, RIGHT URETERAL STENT PLACEMENT;  Surgeon: Ardis Hughs, MD;  Location: [facility];  Service: Urology;  Laterality: Right;  . ESOPHAGOGASTRODUODENOSCOPY N/A 05/17/2016   Procedure: ESOPHAGOGASTRODUODENOSCOPY (EGD);  Surgeon: Danie Binder, MD;  Location: [facility];  Service: Endoscopy;  Laterality: N/A;  830   . kidney stents      OB History    Gravida Para Term  Preterm AB Living   3 2 2   1 2    SAB TAB Ectopic Multiple Live Births   1       2       Home Medications    Prior to Admission medications   Medication Sig Start Date End Date Taking? Authorizing Provider  busPIRone (BUSPAR) 5 MG tablet Take 5 mg by mouth 2 (two) times daily.    Historical Provider, MD  cephALEXin (KEFLEX) 500 MG capsule Take 1 capsule (500 mg total) by mouth 4 (four) times daily. 07/16/16   Olivia Canter Sam, PA-C  indomethacin (INDOCIN) 50 MG capsule Take 1 capsule (50 mg total) by mouth 3 (three) times daily with meals. 07/10/16   Robyn Haber, MD  oxybutynin (DITROPAN) 5 MG tablet Take 1 tablet (5 mg total) by mouth 3 (three) times daily. 07/16/16   Olivia Canter Sam, PA-C  polyethylene glycol (MIRALAX / GLYCOLAX) packet Take 17 g by mouth daily. Patient not taking: Reported on 07/16/2016 07/07/16   Hosie Poisson, MD  promethazine (PHENERGAN) 25 MG tablet Take 1 tablet (25 mg total) by mouth every 6 (six) hours as needed for nausea or vomiting. 07/20/16   Fredia Sorrow, MD  senna-docusate (SENOKOT-S) 8.6-50 MG tablet Take 2 tablets by mouth 2 (two) times daily. Patient not taking: Reported on 07/16/2016 07/07/16   Hosie Poisson, MD  sertraline (ZOLOFT) 50 MG tablet Take 50 mg by mouth daily.    Historical Provider, MD  traMADol Veatrice Bourbon)  50 MG tablet Take 1 tablet (50 mg total) by mouth daily. 07/07/16   Hosie Poisson, MD    Family History Family History  Problem Relation Age of Onset  . Colon cancer Paternal Uncle     Passed away 01/24/15    Social History Social History  Substance Use Topics  . Smoking status: Current Every Day Smoker    Packs/day: 1.50    Types: Cigarettes  . Smokeless tobacco: Never Used     Comment: 3-4 cigarettes daily  . Alcohol use 2.4 oz/week    4 Shots of liquor per week     Comment: occasionally, once every 2-3 months.     Allergies   Codeine; Percocet [oxycodone-acetaminophen]; and Vicodin [hydrocodone-acetaminophen]   Review of  Systems Review of Systems  Constitutional: Negative for fever.  HENT: Negative for congestion.   Eyes: Negative.  Negative for visual disturbance.  Respiratory: Negative for shortness of breath.   Cardiovascular: Negative for chest pain.  Gastrointestinal: Positive for abdominal pain, nausea and vomiting. Negative for diarrhea.  Genitourinary: Negative for dysuria.  Musculoskeletal: Negative for back pain.  Skin: Negative for rash.  Neurological: Negative for headaches.  Hematological: Does not bruise/bleed easily.  Psychiatric/Behavioral: Negative for confusion.     Physical Exam Updated Vital Signs BP 108/74   Pulse (!) 58   Temp 98.3 F (36.8 C) (Oral)   Resp 15   LMP 07/16/2016 (Exact Date)   SpO2 97%   Physical Exam  Constitutional: She is oriented to person, place, and time. She appears well-developed and well-nourished. No distress.  HENT:  Head: Normocephalic and atraumatic.  Mucous membranes dry.  Eyes: EOM are normal. Pupils are equal, round, and reactive to light.  Neck: Normal range of motion. Neck supple.  Cardiovascular: Normal rate and regular rhythm.   Pulmonary/Chest: Effort normal and breath sounds normal. No respiratory distress.  Abdominal: Soft. Bowel sounds are normal. There is no tenderness.  Musculoskeletal: Normal range of motion.  Neurological: She is alert and oriented to person, place, and time. No cranial nerve deficit. She exhibits normal muscle tone. Coordination normal.  Skin: Skin is warm.  Nursing note and vitals reviewed.    ED Treatments / Results  Labs (all labs ordered are listed, but only abnormal results are displayed) Labs Reviewed  COMPREHENSIVE METABOLIC PANEL - Abnormal; Notable for the following:       Result Value   Potassium 3.4 (*)    CO2 19 (*)    Glucose, Bld 141 (*)    All other components within normal limits  CBC - Abnormal; Notable for the following:    WBC 16.9 (*)    All other components within normal  limits  URINALYSIS, ROUTINE W REFLEX MICROSCOPIC (NOT AT [facility]) - Abnormal; Notable for the following:    Color, Urine AMBER (*)    APPearance TURBID (*)    Glucose, UA 100 (*)    Hgb urine dipstick LARGE (*)    Bilirubin Urine SMALL (*)    Ketones, ur 40 (*)    Protein, ur >300 (*)    Leukocytes, UA LARGE (*)    All other components within normal limits  URINE MICROSCOPIC-ADD ON - Abnormal; Notable for the following:    Squamous Epithelial / LPF 6-30 (*)    Bacteria, UA MANY (*)    All other components within normal limits  LIPASE, BLOOD  I-STAT BETA HCG BLOOD, ED ([facility], [facility], [facility] ONLY)    EKG  EKG Interpretation None  Radiology No results found.  Procedures Procedures (including critical care time)  Medications Ordered in ED Medications  0.9 %  sodium chloride infusion ( Intravenous New Bag/Given 07/20/16 0831)  famotidine (PEPCID) IVPB 20 mg premix (0 mg Intravenous Stopped 07/20/16 0911)  promethazine (PHENERGAN) injection 12.5 mg (not administered)  ondansetron (ZOFRAN) injection 4 mg (4 mg Intravenous Given 07/20/16 0743)  sodium chloride 0.9 % bolus 1,000 mL (0 mLs Intravenous Stopped 07/20/16 0906)  HYDROmorphone (DILAUDID) injection 1 mg (1 mg Intravenous Given 07/20/16 0829)  metoCLOPramide (REGLAN) injection 10 mg (10 mg Intravenous Given 07/20/16 0829)  haloperidol lactate (HALDOL) injection 5 mg (5 mg Intravenous Given 07/20/16 0829)  sodium chloride 0.9 % bolus 1,000 mL (1,000 mLs Intravenous New Bag/Given 07/20/16 0906)  sodium chloride 0.9 % bolus 1,000 mL (1,000 mLs Intravenous New Bag/Given 07/20/16 1215)     Initial Impression / Assessment and Plan / ED Course  I have reviewed the triage vital signs and the nursing notes.  Pertinent labs & imaging results that were available during my care of the patient were reviewed by me and considered in my medical decision making (see chart for details).  Clinical Course    Patient seen frequently for the abdominal  pain. But symptoms seem to be somewhat consistent with like a cyclical vomiting type syndrome. Patient with improvement here with Haldol Reglan and hydromorphone. Patient also given some Phenergan at discharge. Patient received 3 L of fluid. Patient feeling better but not completely back to normal. Patient able to drink a Sprite.  Patient's labs of with evidence of a metabolic acidosis most likely just due to dehydration. Renal function normal. No acute abdominal process based on exam.  Final Clinical Impressions(s) / ED Diagnoses   Final diagnoses:  Intractable cyclical vomiting with nausea    New Prescriptions New Prescriptions   PROMETHAZINE (PHENERGAN) 25 MG TABLET    Take 1 tablet (25 mg total) by mouth every 6 (six) hours as needed for nausea or vomiting.     Fredia Sorrow, MD 07/20/16 1239

## 2016-07-20 NOTE — Discharge Instructions (Signed)
Follow-up with GI medicine as scheduled before. Trial of Phenergan every 6 hours to see if it helps control the vomiting cycle. Return for any new or worse symptoms.

## 2016-07-21 ENCOUNTER — Emergency Department (HOSPITAL_COMMUNITY)
Admission: EM | Admit: 2016-07-21 | Discharge: 2016-07-21 | Disposition: A | Discharge status: Home / Self Care | Payer: Medicaid Other | Attending: Emergency Medicine | Admitting: Emergency Medicine

## 2016-07-21 ENCOUNTER — Encounter (HOSPITAL_COMMUNITY): Payer: Self-pay | Admitting: Emergency Medicine

## 2016-07-21 DIAGNOSIS — R1115 Cyclical vomiting syndrome unrelated to migraine: Secondary | ICD-10-CM

## 2016-07-21 DIAGNOSIS — F17210 Nicotine dependence, cigarettes, uncomplicated: Secondary | ICD-10-CM | POA: Insufficient documentation

## 2016-07-21 DIAGNOSIS — Z5181 Encounter for therapeutic drug level monitoring: Secondary | ICD-10-CM | POA: Diagnosis not present

## 2016-07-21 DIAGNOSIS — R112 Nausea with vomiting, unspecified: Secondary | ICD-10-CM | POA: Diagnosis not present

## 2016-07-21 DIAGNOSIS — R1013 Epigastric pain: Secondary | ICD-10-CM

## 2016-07-21 LAB — LIPASE, BLOOD: Lipase: 22 U/L (ref 11–51)

## 2016-07-21 LAB — COMPREHENSIVE METABOLIC PANEL
ALBUMIN: 4 g/dL (ref 3.5–5.0)
ALT: 19 U/L (ref 14–54)
ANION GAP: 12 (ref 5–15)
AST: 24 U/L (ref 15–41)
Alkaline Phosphatase: 54 U/L (ref 38–126)
BUN: <5 mg/dL — ABNORMAL LOW (ref 6–20)
CHLORIDE: 103 mmol/L (ref 101–111)
CO2: 21 mmol/L — AB (ref 22–32)
Calcium: 9.1 mg/dL (ref 8.9–10.3)
Creatinine, Ser: 0.73 mg/dL (ref 0.44–1.00)
GFR calc Af Amer: >60 mL/min (ref >60)
GFR calc non Af Amer: >60 mL/min (ref >60)
GLUCOSE: 118 mg/dL — AB (ref 65–99)
POTASSIUM: 3 mmol/L — AB (ref 3.5–5.1)
SODIUM: 136 mmol/L (ref 135–145)
TOTAL PROTEIN: 6.5 g/dL (ref 6.5–8.1)
Total Bilirubin: 0.7 mg/dL (ref 0.3–1.2)

## 2016-07-21 LAB — CBC WITH DIFFERENTIAL/PLATELET
BASOS ABS: 0 10*3/uL (ref 0.0–0.1)
Basophils Relative: 0 %
Eosinophils Absolute: 0 10*3/uL (ref 0.0–0.7)
Eosinophils Relative: 0 %
HEMATOCRIT: 37.7 % (ref 36.0–46.0)
Hemoglobin: 12.4 g/dL (ref 12.0–15.0)
LYMPHS ABS: 2 10*3/uL (ref 0.7–4.0)
LYMPHS PCT: 13 %
MCH: 30 pg (ref 26.0–34.0)
MCHC: 32.9 g/dL (ref 30.0–36.0)
MCV: 91.1 fL (ref 78.0–100.0)
MONO ABS: 0.6 10*3/uL (ref 0.1–1.0)
Monocytes Relative: 4 %
NEUTROS ABS: 13.6 10*3/uL — AB (ref 1.7–7.7)
Neutrophils Relative %: 83 %
Platelets: 378 10*3/uL (ref 150–400)
RBC: 4.14 MIL/uL (ref 3.87–5.11)
RDW: 13.8 % (ref 11.5–15.5)
WBC: 16.3 10*3/uL — ABNORMAL HIGH (ref 4.0–10.5)

## 2016-07-21 LAB — RAPID URINE DRUG SCREEN, HOSP PERFORMED
AMPHETAMINES: NOT DETECTED
BARBITURATES: NOT DETECTED
Benzodiazepines: NOT DETECTED
Cocaine: NOT DETECTED
Opiates: NOT DETECTED
TETRAHYDROCANNABINOL: POSITIVE — AB

## 2016-07-21 MED ORDER — HYDROCODONE-ACETAMINOPHEN 5-325 MG PO TABS
1.0000 | ORAL_TABLET | Freq: Once | ORAL | Status: AC
  Administered 2016-07-21: 1 via ORAL
  Filled 2016-07-21: qty 1

## 2016-07-21 MED ORDER — HALOPERIDOL LACTATE 5 MG/ML IJ SOLN
5.0000 mg | Freq: Once | INTRAMUSCULAR | Status: AC
Start: 2016-07-21 — End: 2016-07-21
  Administered 2016-07-21: 5 mg via INTRAVENOUS
  Filled 2016-07-21: qty 1

## 2016-07-21 MED ORDER — POTASSIUM CHLORIDE CRYS ER 20 MEQ PO TBCR
40.0000 meq | EXTENDED_RELEASE_TABLET | Freq: Once | ORAL | Status: AC
  Administered 2016-07-21: 40 meq via ORAL
  Filled 2016-07-21 (×2): qty 2

## 2016-07-21 MED ORDER — PROMETHAZINE HCL 25 MG PO TABS
25.0000 mg | ORAL_TABLET | Freq: Four times a day (QID) | ORAL | 0 refills | Status: DC | PRN
Start: 2016-07-21 — End: 2016-08-26

## 2016-07-21 MED ORDER — SODIUM CHLORIDE 0.9 % IV BOLUS (SEPSIS)
1000.0000 mL | Freq: Once | INTRAVENOUS | Status: AC
  Administered 2016-07-21: 1000 mL via INTRAVENOUS

## 2016-07-21 MED ORDER — LORAZEPAM 2 MG/ML IJ SOLN
1.0000 mg | Freq: Once | INTRAMUSCULAR | Status: AC
  Administered 2016-07-21: 1 mg via INTRAVENOUS
  Filled 2016-07-21: qty 1

## 2016-07-21 MED ORDER — GI COCKTAIL ~~LOC~~
30.0000 mL | Freq: Once | ORAL | Status: AC
  Administered 2016-07-21: 30 mL via ORAL
  Filled 2016-07-21: qty 30

## 2016-07-21 MED ORDER — METOCLOPRAMIDE HCL 5 MG/ML IJ SOLN
10.0000 mg | Freq: Once | INTRAMUSCULAR | Status: AC
  Administered 2016-07-21: 10 mg via INTRAVENOUS
  Filled 2016-07-21: qty 2

## 2016-07-21 NOTE — ED Notes (Signed)
Patient waiting on ride.

## 2016-07-21 NOTE — Discharge Instructions (Signed)
Pick up your prescription for phenergan at the [facility]. It will cost less than $10. Do not smoke marijuana anymore as it is likely making your nausea/vomiting worse if not causing it. Finish your antibiotics as prescribed. Take all your other medications as prescribed. Follow up with GI and urology as soon as possible.

## 2016-07-21 NOTE — ED Provider Notes (Signed)
Jeanne Mcneil Provider Note   CSN: [id number] Arrival date & time: 07/21/16  0630     History   Chief Complaint Chief Complaint  Patient presents with  . Abdominal Pain  . Emesis   HPI  ALETTE Mcneil is an 30 y.o. female with history of chronic abdominal pain, gastritis, kidney stones, with a right ureteral stent in place currently who presents to the ED for evaluation of epigastric pain, nausea, and vomiting. This is her fifth ED visit within the past month for similar complaint. I have actually saw pt on 8/27 and she has returned to the ED twice more since then. She was last seen yesterday with negative workup, her presentation thought likely to be secondary to cyclical vomiting syndrome, and discharged home with rx for phenergan. Pt returns today because her pain and n/v has returned. She states she was not able to pick up the phenergan due to cost. She states she didn't even try because she wouldn't be able to afford it. In the ED now she is complaining of epigastric pain and inability to tolerate anything PO. States her symptoms started again yesterday. She states she is also having some dark tarry stools but this has been a chronic intermittent issue for her. She states she has persistent hematuria since her stent placement but has been taking the keflex and ditropan I prescribed her on 8/27 (which she was able to pick up via [facility] program) and states her flank/kidney pain has subsided. She states she has GI follow up this week. Denies fever or chills. Denies other illicit drug use.  Past Medical History:  Diagnosis Date  . Fibroid uterus    fibroid lymphoma  . Gastritis   . Glaucoma   . Hypercalcuria   . Kidney stones     Patient Active Problem List   Diagnosis Date Noted  . Ureteral stone 07/04/2016  . UTI (urinary tract infection) 07/04/2016  . Hydronephrosis of right kidney 07/04/2016  . GERD (gastroesophageal reflux disease) 07/04/2016  . Ureteral calculi  07/04/2016  . Abdominal pain, chronic, epigastric   . Hematemesis 05/16/2016  . Heme + stool 05/16/2016  . Abdominal pain, epigastric 05/16/2016    Past Surgical History:  Procedure Laterality Date  . CYSTOSCOPY W/ URETERAL STENT PLACEMENT Right 07/04/2016   Procedure: CYSTOSCOPY WITH RIGHT RETROGRADE PYELOGRAM, RIGHT URETERAL STENT PLACEMENT;  Surgeon: Ardis Hughs, MD;  Location: [facility] OR[facility];  Service: Urology;  Laterality: Right;  . ESOPHAGOGASTRODUODENOSCOPY N/A 05/17/2016   Procedure: ESOPHAGOGASTRODUODENOSCOPY (EGD);  Surgeon: Danie Binder, MD;  Location: [facility] END[facility] SU[facility];  Service: Endoscopy;  Laterality: N/A;  830   . kidney stents      OB History    Gravida Para Term Preterm AB Living   3 2 2   1 2    SAB TAB Ectopic Multiple Live Births   1       2       Home Medications    Prior to Admission medications   Medication Sig Start Date End Date Taking? Authorizing Provider  busPIRone (BUSPAR) 5 MG tablet Take 5 mg by mouth 2 (two) times daily.    Historical Provider, MD  cephALEXin (KEFLEX) 500 MG capsule Take 1 capsule (500 mg total) by mouth 4 (four) times daily. 07/16/16   Olivia Canter [place] Hedman, PA-C  indomethacin (INDOCIN) 50 MG capsule Take 1 capsule (50 mg total) by mouth 3 (three) times daily with meals. 07/10/16   Robyn Haber, MD  oxybutynin (DITROPAN) 5  MG tablet Take 1 tablet (5 mg total) by mouth 3 (three) times daily. 07/16/16   Olivia Canter Stokely Jeancharles, PA-C  polyethylene glycol (MIRALAX / GLYCOLAX) packet Take 17 g by mouth daily. Patient not taking: Reported on 07/16/2016 07/07/16   Hosie Poisson, MD  promethazine (PHENERGAN) 25 MG tablet Take 1 tablet (25 mg total) by mouth every 6 (six) hours as needed for nausea or vomiting. 07/20/16   Fredia Sorrow, MD  senna-docusate (SENOKOT-S) 8.6-50 MG tablet Take 2 tablets by mouth 2 (two) times daily. Patient not taking: Reported on 07/16/2016 07/07/16   Hosie Poisson, MD  sertraline (ZOLOFT) 50 MG tablet Take 50 mg by mouth daily.     Historical Provider, MD  traMADol (ULTRAM) 50 MG tablet Take 1 tablet (50 mg total) by mouth daily. 07/07/16   Hosie Poisson, MD    Family History Family History  Problem Relation Age of Onset  . Colon cancer Paternal Uncle     Passed away 2015-02-10    Social History Social History  Substance Use Topics  . Smoking status: Current Every Day Smoker    Packs/day: 1.50    Types: Cigarettes  . Smokeless tobacco: Never Used     Comment: 3-4 cigarettes daily  . Alcohol use 2.4 oz/week    4 Shots of liquor per week     Comment: occasionally, once every 2-3 months.     Allergies   Codeine; Percocet [oxycodone-acetaminophen]; and Vicodin [hydrocodone-acetaminophen]   Review of Systems Review of Systems 10 Systems reviewed and are negative for acute change except as noted in the HPI.  Physical Exam Updated Vital Signs BP 172/96   Pulse 60   Temp 98.1 F (36.7 C) (Oral)   Resp 22   Ht 5' 2"  (1.575 m)   Wt 63.5 kg   LMP 07/11/2016   SpO2 98%   BMI 25.61 kg/m   Physical Exam  Constitutional: She is oriented to person, place, and time. No distress.  Tearful, at times dry heaving  HENT:  Head: Atraumatic.  Right Ear: External ear normal.  Left Ear: External ear normal.  Nose: Nose normal.  Eyes: Conjunctivae are normal. No scleral icterus.  Cardiovascular: Normal rate and regular rhythm.   Pulmonary/Chest: Effort normal. No respiratory distress.  Abdominal: She exhibits no distension.  Mild epigastric tenderness no rebound no guarding No CVA tenderness  Neurological: She is alert and oriented to person, place, and time.  Skin: Skin is warm and dry. She is not diaphoretic.  Psychiatric: She has a normal mood and affect. Her behavior is normal.  Nursing note and vitals reviewed.    ED Treatments / Results  Labs (all labs ordered are listed, but only abnormal results are displayed) Labs Reviewed  URINE RAPID DRUG SCREEN, HOSP PERFORMED - Abnormal; Notable for the  following:       Result Value   Tetrahydrocannabinol POSITIVE (*)    All other components within normal limits  COMPREHENSIVE METABOLIC PANEL - Abnormal; Notable for the following:    Potassium 3.0 (*)    CO2 21 (*)    Glucose, Bld 118 (*)    BUN <5 (*)    All other components within normal limits  CBC WITH DIFFERENTIAL/PLATELET - Abnormal; Notable for the following:    WBC 16.3 (*)    Neutro Abs 13.6 (*)    All other components within normal limits  LIPASE, BLOOD    EKG  EKG Interpretation None       Radiology No  results found.  Procedures Procedures (including critical care time)  Medications Ordered in ED Medications  haloperidol lactate (HALDOL) injection 5 mg (5 mg Intravenous Given 07/21/16 0749)  LORazepam (ATIVAN) injection 1 mg (1 mg Intravenous Given 07/21/16 0749)  metoCLOPramide (REGLAN) injection 10 mg (10 mg Intravenous Given 07/21/16 0749)  sodium chloride 0.9 % bolus 1,000 mL (1,000 mLs Intravenous New Bag/Given 07/21/16 0749)  gi cocktail (Maalox,Lidocaine,Donnatal) (30 mLs Oral Given 07/21/16 0748)  potassium chloride SA (K-DUR,KLOR-CON) CR tablet 40 mEq (40 mEq Oral Given 07/21/16 0859)  HYDROcodone-acetaminophen (NORCO/VICODIN) 5-325 MG per tablet 1 tablet (1 tablet Oral Given 07/21/16 0906)     Initial Impression / Assessment and Plan / ED Course  I have reviewed the triage vital signs and the nursing notes.  Pertinent labs & imaging results that were available during my care of the patient were reviewed by me and considered in my medical decision making (see chart for details).  Clinical Course    UDS positive for THC. Suspect cyclical vomiting syndrome. VSS. Abdominal exam non peritoneal and reassuring. States she feels improved. This is pt's fifth ED visit within the past month for similar complaints. She has had multiple negative thorough recent workups. Her potassium is low which we will replete. Pt is now tolerating PO. I spoke with CM who spoke with the  outpatient [place] pharmacy who stated that phenergan 2m tabs will be less than $9. Pt states she will be able to fill this prescription. She states she has pepcid at home for her gastritis. Instructed close GI and urology follow up. Pt stable for discharge.  Final Clinical Impressions(s) / ED Diagnoses   Final diagnoses:  Non-intractable cyclical vomiting with nausea  Epigastric pain    New Prescriptions New Prescriptions   PROMETHAZINE (PHENERGAN) 25 MG TABLET    Take 1 tablet (25 mg total) by mouth every 6 (six) hours as needed for nausea or vomiting.     SAnne Ng PA-C 07/21/16 0[place] MD 07/22/16 [id number]

## 2016-07-21 NOTE — ED Triage Notes (Signed)
Pt in from home with c/o sharp upper abd pain, n/v/d x 2 days. States she was seen yesterday but unable to fill her Phenergan rx. Reports vomitting "30 times in 24 hrs, having 4 episodes of diarrhea, and bleeding in her stomach". Pt reports tarry stools since 0230 this morning. Alert, anxious

## 2016-07-21 NOTE — Discharge Planning (Signed)
EDCM consulted to assist with phenergan discount cards/programs.  EDCM called [place] to find that Phenergan 25MG is $9.00 if you receive #60 or less.  Information relayed to [place], [place].

## 2016-07-22 ENCOUNTER — Emergency Department (HOSPITAL_COMMUNITY)
Admission: EM | Admit: 2016-07-22 | Discharge: 2016-07-22 | Disposition: A | Discharge status: Home / Self Care | Payer: Medicaid Other | Attending: Emergency Medicine | Admitting: Emergency Medicine

## 2016-07-22 ENCOUNTER — Emergency Department (HOSPITAL_COMMUNITY): Payer: Medicaid Other

## 2016-07-22 ENCOUNTER — Encounter (HOSPITAL_COMMUNITY): Payer: Self-pay | Admitting: Emergency Medicine

## 2016-07-22 DIAGNOSIS — Z79899 Other long term (current) drug therapy: Secondary | ICD-10-CM | POA: Diagnosis not present

## 2016-07-22 DIAGNOSIS — F17210 Nicotine dependence, cigarettes, uncomplicated: Secondary | ICD-10-CM | POA: Insufficient documentation

## 2016-07-22 DIAGNOSIS — R52 Pain, unspecified: Secondary | ICD-10-CM

## 2016-07-22 DIAGNOSIS — G8929 Other chronic pain: Secondary | ICD-10-CM | POA: Diagnosis not present

## 2016-07-22 DIAGNOSIS — R112 Nausea with vomiting, unspecified: Secondary | ICD-10-CM | POA: Insufficient documentation

## 2016-07-22 DIAGNOSIS — R109 Unspecified abdominal pain: Secondary | ICD-10-CM

## 2016-07-22 DIAGNOSIS — R1013 Epigastric pain: Secondary | ICD-10-CM | POA: Diagnosis not present

## 2016-07-22 LAB — CBC WITH DIFFERENTIAL/PLATELET
BASOS ABS: 0.1 10*3/uL (ref 0.0–0.1)
Basophils Relative: 1 %
EOS ABS: 0 10*3/uL (ref 0.0–0.7)
EOS PCT: 0 %
HCT: 42 % (ref 36.0–46.0)
Hemoglobin: 14.2 g/dL (ref 12.0–15.0)
LYMPHS PCT: 19 %
Lymphs Abs: 2.6 10*3/uL (ref 0.7–4.0)
MCH: 30 pg (ref 26.0–34.0)
MCHC: 33.8 g/dL (ref 30.0–36.0)
MCV: 88.8 fL (ref 78.0–100.0)
MONO ABS: 1.3 10*3/uL — AB (ref 0.1–1.0)
Monocytes Relative: 9 %
Neutro Abs: 9.8 10*3/uL — ABNORMAL HIGH (ref 1.7–7.7)
Neutrophils Relative %: 71 %
PLATELETS: 380 10*3/uL (ref 150–400)
RBC: 4.73 MIL/uL (ref 3.87–5.11)
RDW: 13.3 % (ref 11.5–15.5)
WBC: 13.7 10*3/uL — AB (ref 4.0–10.5)

## 2016-07-22 LAB — COMPREHENSIVE METABOLIC PANEL
ALK PHOS: 63 U/L (ref 38–126)
ALT: 20 U/L (ref 14–54)
AST: 21 U/L (ref 15–41)
Albumin: 4.5 g/dL (ref 3.5–5.0)
Anion gap: 9 (ref 5–15)
BILIRUBIN TOTAL: 1 mg/dL (ref 0.3–1.2)
BUN: 8 mg/dL (ref 6–20)
CALCIUM: 9.2 mg/dL (ref 8.9–10.3)
CHLORIDE: 99 mmol/L — AB (ref 101–111)
CO2: 24 mmol/L (ref 22–32)
CREATININE: 0.79 mg/dL (ref 0.44–1.00)
Glucose, Bld: 102 mg/dL — ABNORMAL HIGH (ref 65–99)
Potassium: 2.8 mmol/L — ABNORMAL LOW (ref 3.5–5.1)
Sodium: 132 mmol/L — ABNORMAL LOW (ref 135–145)
TOTAL PROTEIN: 7.5 g/dL (ref 6.5–8.1)

## 2016-07-22 LAB — URINALYSIS, ROUTINE W REFLEX MICROSCOPIC
GLUCOSE, UA: NEGATIVE mg/dL
Ketones, ur: 40 mg/dL — AB
NITRITE: NEGATIVE
PH: 7 (ref 5.0–8.0)
Protein, ur: 100 mg/dL — AB
SPECIFIC GRAVITY, URINE: 1.015 (ref 1.005–1.030)

## 2016-07-22 LAB — URINE MICROSCOPIC-ADD ON

## 2016-07-22 LAB — LIPASE, BLOOD: LIPASE: 20 U/L (ref 11–51)

## 2016-07-22 LAB — PREGNANCY, URINE: Preg Test, Ur: NEGATIVE

## 2016-07-22 MED ORDER — POTASSIUM CHLORIDE CRYS ER 20 MEQ PO TBCR: 40.0000 meq | EXTENDED_RELEASE_TABLET | Freq: Once | ORAL | Status: DC

## 2016-07-22 MED ORDER — DIPHENHYDRAMINE HCL 50 MG/ML IJ SOLN
50.0000 mg | Freq: Once | INTRAMUSCULAR | Status: AC
  Administered 2016-07-22: 50 mg via INTRAMUSCULAR
  Filled 2016-07-22: qty 1

## 2016-07-22 MED ORDER — POTASSIUM CHLORIDE 20 MEQ/15ML (10%) PO SOLN
ORAL | Status: AC
  Administered 2016-07-22: 40 meq
  Filled 2016-07-22: qty 30

## 2016-07-22 MED ORDER — HALOPERIDOL LACTATE 5 MG/ML IJ SOLN
5.0000 mg | Freq: Once | INTRAMUSCULAR | Status: AC
  Administered 2016-07-22: 5 mg via INTRAMUSCULAR
  Filled 2016-07-22: qty 1

## 2016-07-22 MED ORDER — GI COCKTAIL ~~LOC~~
30.0000 mL | Freq: Once | ORAL | Status: AC
  Administered 2016-07-22: 30 mL via ORAL
  Filled 2016-07-22: qty 30

## 2016-07-22 MED ORDER — LORAZEPAM 2 MG/ML IJ SOLN
1.0000 mg | Freq: Once | INTRAMUSCULAR | Status: AC
  Administered 2016-07-22: 1 mg via INTRAMUSCULAR
  Filled 2016-07-22: qty 1

## 2016-07-22 MED ORDER — PROMETHAZINE HCL 25 MG/ML IJ SOLN
25.0000 mg | Freq: Once | INTRAMUSCULAR | Status: AC
  Administered 2016-07-22: 25 mg via INTRAMUSCULAR
  Filled 2016-07-22: qty 1

## 2016-07-22 MED ORDER — POTASSIUM CHLORIDE CRYS ER 20 MEQ PO TBCR
40.0000 meq | EXTENDED_RELEASE_TABLET | Freq: Once | ORAL | Status: AC
  Administered 2016-07-22: 40 meq via ORAL
  Filled 2016-07-22: qty 2

## 2016-07-22 MED ORDER — DICYCLOMINE HCL 20 MG PO TABS: 20.0000 mg | ORAL_TABLET | Freq: Four times a day (QID) | ORAL | 0 refills | Status: DC | PRN

## 2016-07-22 MED ORDER — PROMETHAZINE HCL 25 MG RE SUPP: 25.0000 mg | Freq: Four times a day (QID) | RECTAL | 0 refills | Status: DC | PRN

## 2016-07-22 NOTE — ED Triage Notes (Signed)
PT c/o continuing nausea with epigastric pain and lower left sided back pain for a week. PT also states increased urinary frequency. PT also c/o diarrhea starting a week ago.

## 2016-07-22 NOTE — ED Notes (Signed)
Vomited potassium tabs

## 2016-07-22 NOTE — ED Provider Notes (Signed)
[place] DEPT Provider Note   CSN: [id number] Arrival date & time: 07/22/16  1401     History   Chief Complaint Chief Complaint  Patient presents with  . Abdominal Pain    HPI Jeanne Mcneil is a 30 y.o. female.  HPI  Pt was seen at 1425.  Per pt, c/o gradual onset and persistence of constant acute flair of her chronic upper abd "pain" for the past 1+ month.  Has been associated with multiple intermittent episodes of N/V.  Describes the abd pain as "aching."  Denies diarrhea, no fevers, no back pain, no rash, no CP/SOB, no change in pt's chronic intermittent "dark" stools.  The symptoms have been associated with no other complaints. The patient has a significant history of similar symptoms previously, recently being evaluated for this complaint and multiple prior evals for same.  This it pt's 8th ED visit in the past 2 weeks, with most recent ED visit yesterday. Per EPIC chart review: given pt's +UDS for THC, there has been a suspicion for dx cyclical vomiting syndrome.    Past Medical History:  Diagnosis Date  . Abdominal pain, chronic, epigastric   . Esophagitis   . Fibroid uterus    fibroid lymphoma  . Gastritis   . Glaucoma   . Hypercalcuria   . Kidney stones     Patient Active Problem List   Diagnosis Date Noted  . Ureteral stone 07/04/2016  . UTI (urinary tract infection) 07/04/2016  . Hydronephrosis of right kidney 07/04/2016  . GERD (gastroesophageal reflux disease) 07/04/2016  . Ureteral calculi 07/04/2016  . Abdominal pain, chronic, epigastric   . Hematemesis 05/16/2016  . Heme + stool 05/16/2016  . Abdominal pain, epigastric 05/16/2016    Past Surgical History:  Procedure Laterality Date  . CYSTOSCOPY W/ URETERAL STENT PLACEMENT Right 07/04/2016   Procedure: CYSTOSCOPY WITH RIGHT RETROGRADE PYELOGRAM, RIGHT URETERAL STENT PLACEMENT;  Surgeon: Ardis Hughs, MD;  Location: [facility];  Service: Urology;  Laterality: Right;  .  ESOPHAGOGASTRODUODENOSCOPY N/A 05/17/2016   Procedure: ESOPHAGOGASTRODUODENOSCOPY (EGD);  Surgeon: Danie Binder, MD;  Location: [facility];  Service: Endoscopy;  Laterality: N/A;  830   . kidney stents      OB History    Gravida Para Term Preterm AB Living   3 2 2   1 2    SAB TAB Ectopic Multiple Live Births   1       2       Home Medications    Prior to Admission medications   Medication Sig Start Date End Date Taking? Authorizing Provider  busPIRone (BUSPAR) 5 MG tablet Take 5 mg by mouth 2 (two) times daily.   Yes Historical Provider, MD  cephALEXin (KEFLEX) 500 MG capsule Take 1 capsule (500 mg total) by mouth 4 (four) times daily. 07/16/16  Yes Olivia Canter Sam, PA-C  indomethacin (INDOCIN) 50 MG capsule Take 1 capsule (50 mg total) by mouth 3 (three) times daily with meals. 07/10/16  Yes Robyn Haber, MD  oxybutynin (DITROPAN) 5 MG tablet Take 1 tablet (5 mg total) by mouth 3 (three) times daily. 07/16/16  Yes Olivia Canter Sam, PA-C  polyethylene glycol (MIRALAX / GLYCOLAX) packet Take 17 g by mouth daily. 07/07/16  Yes Hosie Poisson, MD  promethazine (PHENERGAN) 25 MG tablet Take 1 tablet (25 mg total) by mouth every 6 (six) hours as needed for nausea or vomiting. 07/21/16  Yes Olivia Canter Sam, PA-C  senna-docusate (SENOKOT-S) 8.6-50 MG tablet Take  2 tablets by mouth 2 (two) times daily. 07/07/16  Yes Hosie Poisson, MD  traMADol (ULTRAM) 50 MG tablet Take 1 tablet (50 mg total) by mouth daily. 07/07/16  Yes Hosie Poisson, MD    Family History Family History  Problem Relation Age of Onset  . Colon cancer Paternal Uncle     Passed away January 29, 2015    Social History Social History  Substance Use Topics  . Smoking status: Current Every Day Smoker    Packs/day: 1.00    Types: Cigarettes  . Smokeless tobacco: Never Used     Comment: 3-4 cigarettes daily  . Alcohol use 2.4 oz/week    4 Shots of liquor per week     Comment: occasionally, once every 2-3 months.     Allergies   Codeine;  Percocet [oxycodone-acetaminophen]; and Vicodin [hydrocodone-acetaminophen]   Review of Systems Review of Systems ROS: Statement: All systems negative except as marked or noted in the HPI; Constitutional: Negative for fever and chills. ; ; Eyes: Negative for eye pain, redness and discharge. ; ; ENMT: Negative for ear pain, hoarseness, nasal congestion, sinus pressure and sore throat. ; ; Cardiovascular: Negative for chest pain, palpitations, diaphoresis, dyspnea and peripheral edema. ; ; Respiratory: Negative for cough, wheezing and stridor. ; ; Gastrointestinal: +N/V, abd pain. Negative for diarrhea, blood in stool, hematemesis, jaundice and rectal bleeding. . ; ; Genitourinary: Negative for dysuria, flank pain and hematuria. ; ; Musculoskeletal: Negative for back pain and neck pain. Negative for swelling and trauma.; ; Skin: Negative for pruritus, rash, abrasions, blisters, bruising and skin lesion.; ; Neuro: Negative for headache, lightheadedness and neck stiffness. Negative for weakness, altered level of consciousness, altered mental status, extremity weakness, paresthesias, involuntary movement, seizure and syncope.      Physical Exam Updated Vital Signs BP (!) 178/113 (BP Location: Right Arm)   Pulse 80   Temp 98.3 F (36.8 C) (Oral)   Resp 20   Ht 5' 2"  (1.575 m)   Wt 140 lb (63.5 kg)   LMP 07/11/2016   SpO2 100%   BMI 25.61 kg/m   Physical Exam 1430: Physical examination:  Nursing notes reviewed; Vital signs and O2 SAT reviewed;  Constitutional: Well developed, Well nourished, Well hydrated, In no acute distress; Head:  Normocephalic, atraumatic; Eyes: EOMI, PERRL, No scleral icterus; ENMT: Mouth and pharynx normal, Mucous membranes moist; Neck: Supple, Full range of motion, No lymphadenopathy; Cardiovascular: Regular rate and rhythm, No murmur, rub, or gallop; Respiratory: Breath sounds clear & equal bilaterally, No rales, rhonchi, wheezes.  Speaking full sentences with ease, Normal  respiratory effort/excursion; Chest: Nontender, Movement normal; Abdomen: +spitting saliva into emesis bag during exam. Soft, +mid-epigastric tenderness to palp. No rebound or guarding when distracted. Nondistended, Normal bowel sounds; Genitourinary: No CVA tenderness; Extremities: Pulses normal, No tenderness, No edema, No calf edema or asymmetry.; Neuro: AA&Ox3, Major CN grossly intact.  Speech clear. No gross focal motor or sensory deficits in extremities. Climbs on and off stretcher easily by herself. Gait steady.; Skin: Color normal, Warm, Dry.   ED Treatments / Results  Labs (all labs ordered are listed, but only abnormal results are displayed)   EKG  EKG Interpretation None       Radiology   Procedures Procedures (including critical care time)  Medications Ordered in ED Medications  diphenhydrAMINE (BENADRYL) injection 50 mg (50 mg Intramuscular Given 07/22/16 1449)  promethazine (PHENERGAN) injection 25 mg (25 mg Intramuscular Given 07/22/16 1448)  LORazepam (ATIVAN) injection 1 mg (1 mg  Intramuscular Given 07/22/16 1448)     Initial Impression / Assessment and Plan / ED Course  I have reviewed the triage vital signs and the nursing notes.  Pertinent labs & imaging results that were available during my care of the patient were reviewed by me and considered in my medical decision making (see chart for details).  MDM Reviewed: previous chart, nursing note and vitals Reviewed previous: labs and CT scan Interpretation: labs and CT scan   Results for orders placed or performed during the hospital encounter of 07/22/16  Comprehensive metabolic panel  Result Value Ref Range   Sodium 132 (L) 135 - 145 mmol/L   Potassium 2.8 (L) 3.5 - 5.1 mmol/L   Chloride 99 (L) 101 - 111 mmol/L   CO2 24 22 - 32 mmol/L   Glucose, Bld 102 (H) 65 - 99 mg/dL   BUN 8 6 - 20 mg/dL   Creatinine, Ser 0.79 0.44 - 1.00 mg/dL   Calcium 9.2 8.9 - 10.3 mg/dL   Total Protein 7.5 6.5 - 8.1 g/dL    Albumin 4.5 3.5 - 5.0 g/dL   AST 21 15 - 41 U/L   ALT 20 14 - 54 U/L   Alkaline Phosphatase 63 38 - 126 U/L   Total Bilirubin 1.0 0.3 - 1.2 mg/dL   GFR calc non Af Amer >60 >60 mL/min   GFR calc Af Amer >60 >60 mL/min   Anion gap 9 5 - 15  Lipase, blood  Result Value Ref Range   Lipase 20 11 - 51 U/L  CBC with Differential  Result Value Ref Range   WBC 13.7 (H) 4.0 - 10.5 K/uL   RBC 4.73 3.87 - 5.11 MIL/uL   Hemoglobin 14.2 12.0 - 15.0 g/dL   HCT 42.0 36.0 - 46.0 %   MCV 88.8 78.0 - 100.0 fL   MCH 30.0 26.0 - 34.0 pg   MCHC 33.8 30.0 - 36.0 g/dL   RDW 13.3 11.5 - 15.5 %   Platelets 380 150 - 400 K/uL   Neutrophils Relative % 71 %   Neutro Abs 9.8 (H) 1.7 - 7.7 K/uL   Lymphocytes Relative 19 %   Lymphs Abs 2.6 0.7 - 4.0 K/uL   Monocytes Relative 9 %   Monocytes Absolute 1.3 (H) 0.1 - 1.0 K/uL   Eosinophils Relative 0 %   Eosinophils Absolute 0.0 0.0 - 0.7 K/uL   Basophils Relative 1 %   Basophils Absolute 0.1 0.0 - 0.1 K/uL  Pregnancy, urine  Result Value Ref Range   Preg Test, Ur NEGATIVE NEGATIVE  Urinalysis, Routine w reflex microscopic  Result Value Ref Range   Color, Urine RED (A) YELLOW   APPearance HAZY (A) CLEAR   Specific Gravity, Urine 1.015 1.005 - 1.030   pH 7.0 5.0 - 8.0   Glucose, UA NEGATIVE NEGATIVE mg/dL   Hgb urine dipstick LARGE (A) NEGATIVE   Bilirubin Urine MODERATE (A) NEGATIVE   Ketones, ur 40 (A) NEGATIVE mg/dL   Protein, ur 100 (A) NEGATIVE mg/dL   Nitrite NEGATIVE NEGATIVE   Leukocytes, UA LARGE (A) NEGATIVE  Urine microscopic-add on  Result Value Ref Range   Squamous Epithelial / LPF 6-30 (A) NONE SEEN   WBC, UA TOO NUMEROUS TO COUNT 0 - 5 WBC/hpf   RBC / HPF TOO NUMEROUS TO COUNT 0 - 5 RBC/hpf   Bacteria, UA FEW (A) NONE SEEN   Ct Renal Stone Study Result Date: 07/22/2016 CLINICAL DATA:  Nausea, abdominal pain, left  spotted back pain, urinary frequency, right renal stent x2 weeks EXAM: CT ABDOMEN AND PELVIS WITHOUT CONTRAST TECHNIQUE:  Multidetector CT imaging of the abdomen and pelvis was performed following the standard protocol without IV contrast. COMPARISON:  07/16/2016 FINDINGS: Lower chest:  Lung bases are clear. Hepatobiliary: Liver is notable for focal fat/altered perfusion along the falciform ligament. Gallbladder is unremarkable. No intrahepatic or extrahepatic ductal dilatation. Pancreas: Within normal limits. Spleen: Within normal limits. Adrenals/Urinary Tract: Adrenal glands are within normal limits. 4 mm nonobstructing left upper pole renal calculus (series 2/ image 25). 3 mm interpolar right renal calculus (series 2/image 31). Indwelling right double pigtail ureteral stent. No ureteral or bladder calculi.  No hydronephrosis. Bladder is within normal limits. Stomach/Bowel: Stomach is within normal limits. No evidence of bowel obstruction. Normal appendix (series 2/ image 56). Vascular/Lymphatic: No evidence of abdominal aortic aneurysm. No suspicious abdominopelvic lymphadenopathy. Reproductive: Retroverted uterus is within normal limits. Bilateral ovaries are within normal limits. Other: No abdominopelvic ascites. Musculoskeletal: Visualized osseous structures are within normal limits. IMPRESSION: Bilateral nonobstructing renal calculi measuring up to 4 mm on the left. No ureteral or bladder calculi. No hydronephrosis. Right double-pigtail ureteral stent. No evidence of bowel obstruction.  Normal appendix. Electronically Signed   By: Julian Hy M.D.   On: 07/22/2016 15:56     1430:  Pt spitting saliva into emesis bag during exam, hunched over and rocking back and forth. I quivered her THC use, and educated pt regarding possible link of THC to chronic abd pain and recurrent N/V. Pt immediately stopped performing all above behaviors and sat up on the stretcher, stating "I don't smoke that much." Plan of care in ED reviewed with pt, including no indication for narcotics. Pt verb understanding.   1715:  Udip appears  contaminated; UC is pending.  T/C to Uro Dr. Alyson Ingles, case discussed, including:  HPI, pertinent PM/SHx, VS/PE, dx testing, ED course and treatment:  Agrees with waiting for UC results before rx any further antibiotics. Pt vomited GI cocktail and potassium. Continues to request "some pain meds," narcotics specifically. Pt made aware again that I will treat her symptoms but not with narcotics. Will dose IM haldol and re-try PO.   1745:  Pt up walking around exam room, talking on phone, NAD. Pt then saw ED RN and I in the room and immediately began to c/o "such bad pain" and "I need pain medicine" and "how about tramadol?" Pt made aware again she will not receive narcotics, as it they are not indicated in chronic abd pain. Concern for drug seeking behavior. Pt given PO potassium without vomiting. Will d/c pt.         Final Clinical Impressions(s) / ED Diagnoses   Final diagnoses:  Pain  Chronic abdominal pain    New Prescriptions New Prescriptions   No medications on file     Francine Graven, DO 07/26/16 1713

## 2016-07-22 NOTE — Discharge Instructions (Signed)
Eat a bland diet, avoiding greasy, fatty, fried foods, as well as spicy and acidic foods or beverages.  Avoid eating within the hour or 2 before going to bed or laying down.  Also avoid teas, colas, coffee, chocolate, pepermint and spearment.  Take over the counter pepcid, one tablet by mouth twice a day, for the next 2 to 3 weeks.  May also take over the counter maalox/mylanta, as directed on packaging, as needed for discomfort.  Take the prescriptions as directed.  Call your regular GI doctor on Tuesday to schedule a follow up appointment this week.  Return to the Emergency Department immediately if worsening.

## 2016-07-24 LAB — URINE CULTURE

## 2016-07-25 ENCOUNTER — Encounter (HOSPITAL_COMMUNITY): Payer: Self-pay

## 2016-07-25 ENCOUNTER — Emergency Department (HOSPITAL_COMMUNITY)
Admission: EM | Admit: 2016-07-25 | Discharge: 2016-07-25 | Disposition: A | Discharge status: Home / Self Care | Payer: Medicaid Other | Attending: Emergency Medicine | Admitting: Emergency Medicine

## 2016-07-25 DIAGNOSIS — R110 Nausea: Secondary | ICD-10-CM | POA: Diagnosis not present

## 2016-07-25 DIAGNOSIS — F17210 Nicotine dependence, cigarettes, uncomplicated: Secondary | ICD-10-CM | POA: Diagnosis not present

## 2016-07-25 DIAGNOSIS — R531 Weakness: Secondary | ICD-10-CM | POA: Diagnosis not present

## 2016-07-25 DIAGNOSIS — R1013 Epigastric pain: Secondary | ICD-10-CM | POA: Diagnosis not present

## 2016-07-25 DIAGNOSIS — Z79899 Other long term (current) drug therapy: Secondary | ICD-10-CM | POA: Diagnosis not present

## 2016-07-25 LAB — URINALYSIS, ROUTINE W REFLEX MICROSCOPIC
GLUCOSE, UA: NEGATIVE mg/dL
Nitrite: POSITIVE — AB
PH: 6 (ref 5.0–8.0)
Protein, ur: 100 mg/dL — AB
SPECIFIC GRAVITY, URINE: 1.025 (ref 1.005–1.030)

## 2016-07-25 LAB — COMPREHENSIVE METABOLIC PANEL
ALBUMIN: 4.7 g/dL (ref 3.5–5.0)
ALT: 15 U/L (ref 14–54)
ANION GAP: 19 — AB (ref 5–15)
AST: 17 U/L (ref 15–41)
Alkaline Phosphatase: 60 U/L (ref 38–126)
BUN: 12 mg/dL (ref 6–20)
CHLORIDE: 91 mmol/L — AB (ref 101–111)
CO2: 22 mmol/L (ref 22–32)
Calcium: 9.8 mg/dL (ref 8.9–10.3)
Creatinine, Ser: 1 mg/dL (ref 0.44–1.00)
GFR calc Af Amer: >60 mL/min (ref >60)
GLUCOSE: 106 mg/dL — AB (ref 65–99)
POTASSIUM: 3.3 mmol/L — AB (ref 3.5–5.1)
Sodium: 132 mmol/L — ABNORMAL LOW (ref 135–145)
Total Bilirubin: 1.1 mg/dL (ref 0.3–1.2)
Total Protein: 7.6 g/dL (ref 6.5–8.1)

## 2016-07-25 LAB — URINE MICROSCOPIC-ADD ON

## 2016-07-25 LAB — I-STAT BETA HCG BLOOD, ED (MC, WL, AP ONLY): I-stat hCG, quantitative: <5 m[IU]/mL (ref <5)

## 2016-07-25 LAB — LIPASE, BLOOD: LIPASE: 23 U/L (ref 11–51)

## 2016-07-25 LAB — CBC
HEMATOCRIT: 45.4 % (ref 36.0–46.0)
HEMOGLOBIN: 15.1 g/dL — AB (ref 12.0–15.0)
MCH: 29.8 pg (ref 26.0–34.0)
MCHC: 33.3 g/dL (ref 30.0–36.0)
MCV: 89.5 fL (ref 78.0–100.0)
Platelets: 464 10*3/uL — ABNORMAL HIGH (ref 150–400)
RBC: 5.07 MIL/uL (ref 3.87–5.11)
RDW: 13.4 % (ref 11.5–15.5)
WBC: 13.8 10*3/uL — AB (ref 4.0–10.5)

## 2016-07-25 LAB — RAPID URINE DRUG SCREEN, HOSP PERFORMED
AMPHETAMINES: NOT DETECTED
BENZODIAZEPINES: NOT DETECTED
Barbiturates: POSITIVE — AB
COCAINE: NOT DETECTED
OPIATES: NOT DETECTED
TETRAHYDROCANNABINOL: POSITIVE — AB

## 2016-07-25 MED ORDER — SODIUM CHLORIDE 0.9 % IV BOLUS (SEPSIS)
1000.0000 mL | Freq: Once | INTRAVENOUS | Status: AC
  Administered 2016-07-25: 1000 mL via INTRAVENOUS

## 2016-07-25 MED ORDER — LORAZEPAM 2 MG/ML IJ SOLN
1.0000 mg | Freq: Once | INTRAMUSCULAR | Status: AC
  Administered 2016-07-25: 1 mg via INTRAVENOUS
  Filled 2016-07-25: qty 1

## 2016-07-25 MED ORDER — FAMOTIDINE IN NACL 20-0.9 MG/50ML-% IV SOLN
20.0000 mg | Freq: Once | INTRAVENOUS | Status: AC
  Administered 2016-07-25: 20 mg via INTRAVENOUS
  Filled 2016-07-25: qty 50

## 2016-07-25 MED ORDER — HALOPERIDOL LACTATE 5 MG/ML IJ SOLN
4.0000 mg | Freq: Once | INTRAMUSCULAR | Status: DC
Start: 2016-07-25 — End: 2016-07-25

## 2016-07-25 MED ORDER — METOCLOPRAMIDE HCL 5 MG/ML IJ SOLN
10.0000 mg | Freq: Once | INTRAMUSCULAR | Status: AC
  Administered 2016-07-25: 10 mg via INTRAVENOUS
  Filled 2016-07-25: qty 2

## 2016-07-25 NOTE — ED Provider Notes (Signed)
[facility] DEPT Provider Note   CSN: [id number] Arrival date & time: 07/25/16  1236     History   Chief Complaint Chief Complaint  Patient presents with  . Weakness    HPI Jeanne Mcneil is a 30 y.o. female who presents feelings of malaise and nausea. Past medical history significant for chronic abdominal pain with N/V, gastritis, GERD, recurrent kidney stones, anxiety/depression. She was recently discharged on 07/07/16 from [facility] after admission for UTI with ureteral stone s/p cystoscopy and right ureteral stent placement. This will be the 6th time she has been evaluated for this problem since her stent placement. She reports generalized weakness due to decreased by mouth intake. She reports a 25 pound weight loss since the middle of August. She has tried numerous medications without much relief of her symptoms. She's tried Zofran, Phenergan, antacids. She states the vomiting has gotten better however nausea has persisted even with liquids. She is still having constant epigastric pain. Reports feeling hot and cold when she lies down, chest pain for the past 3 days, and chronic hematuria since placement of stent. Has appointment to have it removed tomorrow. She had an EGD on 05/17/16 which showed grade A reflux esophagitis with mild gastritis. She currently denies fever, chills, shortness of breath, cough, flank pain, melena/hematochezia, irritative voiding symptoms, vaginal discharge or bleeding.  HPI  Past Medical History:  Diagnosis Date  . Abdominal pain, chronic, epigastric   . Esophagitis   . Fibroid uterus    fibroid lymphoma  . Gastritis   . Glaucoma   . Hypercalcuria   . Kidney stones     Patient Active Problem List   Diagnosis Date Noted  . Ureteral stone 07/04/2016  . UTI (urinary tract infection) 07/04/2016  . Hydronephrosis of right kidney 07/04/2016  . GERD (gastroesophageal reflux disease) 07/04/2016  . Ureteral calculi 07/04/2016  . Abdominal pain,  chronic, epigastric   . Hematemesis 05/16/2016  . Heme + stool 05/16/2016  . Abdominal pain, epigastric 05/16/2016    Past Surgical History:  Procedure Laterality Date  . CYSTOSCOPY W/ URETERAL STENT PLACEMENT Right 07/04/2016   Procedure: CYSTOSCOPY WITH RIGHT RETROGRADE PYELOGRAM, RIGHT URETERAL STENT PLACEMENT;  Surgeon: Ardis Hughs, MD;  Location: [facility] ORS;  Service: Urology;  Laterality: Right;  . ESOPHAGOGASTRODUODENOSCOPY N/A 05/17/2016   Procedure: ESOPHAGOGASTRODUODENOSCOPY (EGD);  Surgeon: Danie Binder, MD;  Location: [facility] ENDO SUITE;  Service: Endoscopy;  Laterality: N/A;  830   . kidney stents      OB History    Gravida Para Term Preterm AB Living   3 2 2   1 2    SAB TAB Ectopic Multiple Live Births   1       2       Home Medications    Prior to Admission medications   Medication Sig Start Date End Date Taking? Authorizing Provider  busPIRone (BUSPAR) 5 MG tablet Take 5 mg by mouth 2 (two) times daily.    Historical Provider, MD  cephALEXin (KEFLEX) 500 MG capsule Take 1 capsule (500 mg total) by mouth 4 (four) times daily. 07/16/16   Olivia Canter Sam, PA-C  dicyclomine (BENTYL) 20 MG tablet Take 1 tablet (20 mg total) by mouth every 6 (six) hours as needed for spasms (abdominal cramping). 07/22/16   Francine Graven, DO  indomethacin (INDOCIN) 50 MG capsule Take 1 capsule (50 mg total) by mouth 3 (three) times daily with meals. 07/10/16   Robyn Haber, MD  oxybutynin (DITROPAN) 5  MG tablet Take 1 tablet (5 mg total) by mouth 3 (three) times daily. 07/16/16   Olivia Canter Sam, PA-C  polyethylene glycol (MIRALAX / GLYCOLAX) packet Take 17 g by mouth daily. 07/07/16   Hosie Poisson, MD  promethazine (PHENERGAN) 25 MG suppository Place 1 suppository (25 mg total) rectally every 6 (six) hours as needed for nausea or vomiting. 07/22/16   Francine Graven, DO  promethazine (PHENERGAN) 25 MG tablet Take 1 tablet (25 mg total) by mouth every 6 (six) hours as needed for nausea or  vomiting. 07/21/16   Olivia Canter Sam, PA-C  senna-docusate (SENOKOT-S) 8.6-50 MG tablet Take 2 tablets by mouth 2 (two) times daily. 07/07/16   Hosie Poisson, MD  traMADol (ULTRAM) 50 MG tablet Take 1 tablet (50 mg total) by mouth daily. 07/07/16   Hosie Poisson, MD    Family History Family History  Problem Relation Age of Onset  . Colon cancer Paternal Uncle     Passed away 01/16/15    Social History Social History  Substance Use Topics  . Smoking status: Current Every Day Smoker    Packs/day: 1.00    Types: Cigarettes  . Smokeless tobacco: Never Used     Comment: 3-4 cigarettes daily  . Alcohol use 2.4 oz/week    4 Shots of liquor per week     Comment: occasionally, once every 2-3 months.     Allergies   Codeine; Percocet [oxycodone-acetaminophen]; and Vicodin [hydrocodone-acetaminophen]   Review of Systems Review of Systems  Constitutional: Positive for unexpected weight change. Negative for chills and fever.  Respiratory: Negative for shortness of breath.   Cardiovascular: Negative for chest pain.  Gastrointestinal: Positive for abdominal pain and nausea. Negative for constipation, diarrhea and vomiting.  Genitourinary: Positive for hematuria. Negative for vaginal discharge.  All other systems reviewed and are negative.    Physical Exam Updated Vital Signs BP 141/99 (BP Location: Right Arm)   Pulse 97   Temp 98.4 F (36.9 C) (Oral)   Resp 18   Ht 5' 2"  (1.575 m)   Wt 61.9 kg   LMP 07/11/2016 Comment: NEG U preg today  SpO2 97%   BMI 24.97 kg/m   Physical Exam  Constitutional: She is oriented to person, place, and time. She appears well-developed and well-nourished. No distress.  HENT:  Head: Normocephalic and atraumatic.  Eyes: Conjunctivae are normal. Pupils are equal, round, and reactive to light. Right eye exhibits no discharge. Left eye exhibits no discharge. No scleral icterus.  Neck: Normal range of motion. Neck supple.  Cardiovascular: Regular rhythm.   Tachycardia present.  Exam reveals no gallop and no friction rub.   No murmur heard. Pulmonary/Chest: Effort normal and breath sounds normal. No respiratory distress. She has no wheezes. She has no rales. She exhibits no tenderness.  Abdominal: Soft. Bowel sounds are normal. She exhibits no distension and no mass. There is tenderness. There is no rebound and no guarding. No hernia.  Generalized tenderness with maximal point of tenderness in epigastric area. There is delayed reaction with her expression of tenderness on palpation  Musculoskeletal: She exhibits no edema.  Neurological: She is alert and oriented to person, place, and time.  Skin: Skin is warm and dry.  Psychiatric: Her affect is blunt. Her speech is rapid and/or pressured.  She is somewhat abrupt and frustrated but calm and cooperative at this time  Nursing note and vitals reviewed.    ED Treatments / Results  Labs (all labs ordered are listed, but  only abnormal results are displayed) Labs Reviewed  COMPREHENSIVE METABOLIC PANEL - Abnormal; Notable for the following:       Result Value   Sodium 132 (*)    Potassium 3.3 (*)    Chloride 91 (*)    Glucose, Bld 106 (*)    Anion gap 19 (*)    All other components within normal limits  CBC - Abnormal; Notable for the following:    WBC 13.8 (*)    Hemoglobin 15.1 (*)    Platelets 464 (*)    All other components within normal limits  URINALYSIS, ROUTINE W REFLEX MICROSCOPIC (NOT AT [facility]) - Abnormal; Notable for the following:    Color, Urine RED (*)    APPearance TURBID (*)    Hgb urine dipstick LARGE (*)    Bilirubin Urine LARGE (*)    Ketones, ur >80 (*)    Protein, ur 100 (*)    Nitrite POSITIVE (*)    Leukocytes, UA LARGE (*)    All other components within normal limits  URINE RAPID DRUG SCREEN, HOSP PERFORMED - Abnormal; Notable for the following:    Tetrahydrocannabinol POSITIVE (*)    Barbiturates POSITIVE (*)    All other components within normal limits    URINE MICROSCOPIC-ADD ON - Abnormal; Notable for the following:    Squamous Epithelial / LPF 6-30 (*)    Bacteria, UA FEW (*)    All other components within normal limits  LIPASE, BLOOD  I-STAT BETA HCG BLOOD, ED ([facility], [facility], [facility] ONLY)    EKG  EKG Interpretation None       Radiology No results found.  Procedures Procedures (including critical care time)  Medications Ordered in ED Medications  metoCLOPramide (REGLAN) injection 10 mg (10 mg Intravenous Given 07/25/16 1832)  famotidine (PEPCID) IVPB 20 mg premix (0 mg Intravenous Stopped 07/25/16 2029)  LORazepam (ATIVAN) injection 1 mg (1 mg Intravenous Given 07/25/16 2000)  sodium chloride 0.9 % bolus 1,000 mL (0 mLs Intravenous Stopped 07/25/16 1951)  sodium chloride 0.9 % bolus 1,000 mL (0 mLs Intravenous Stopped 07/25/16 2116)    Initial Impression / Assessment and Plan / ED Course  I have reviewed the triage vital signs and the nursing notes.  Pertinent labs & imaging results that were available during my care of the patient were reviewed by me and considered in my medical decision making (see chart for details).  Clinical Course   30 year old female presents with acute on chronic abdominal pain with ongoing nausea and weight loss. Patient is afebrile, not tachycardic or tachypneic, normotensive, and not hypoxic. Labs are at baseline. Lactic acid is normal. UA appears overall at baseline - now has nitrites and >80 ketone. She is on antibiotics and schedule have her stent removed tomorrow at [facility]. After 2 L IV fluid, Reglan, Ativan, Pepcid patient feels improved. She is asking to be admitted however discussed with her that her labs and vital signs are encouraging and I do not want her to miss her appointment with Urology tomorrow. Encouraged her to follow up with GI as well for this ongoing problem. Of note  UDS remarkable for THC and Barbituates. Patient is NAD, non-toxic, with stable VS. Patient is informed of clinical course,  understands medical decision making process, and agrees with plan. Opportunity for questions provided and all questions answered. Return precautions given.  Final Clinical Impressions(s) / ED Diagnoses   Final diagnoses:  Weakness  Epigastric pain  Nausea    New Prescriptions New  Prescriptions   No medications on file     Recardo Evangelist, PA-C 07/25/16 [place], MD 07/26/16 [phone]

## 2016-07-25 NOTE — ED Triage Notes (Signed)
Pt  Chronic nausea, abdominal pain and reports has not been able to eat anything in 3 weeks.  Pt states "I feel my body is shutting down".

## 2016-07-26 ENCOUNTER — Encounter: Payer: Self-pay | Admitting: Nurse Practitioner

## 2016-07-26 ENCOUNTER — Ambulatory Visit (INDEPENDENT_AMBULATORY_CARE_PROVIDER_SITE_OTHER): Payer: Medicaid Other | Admitting: Nurse Practitioner

## 2016-07-26 VITALS — BP 153/104 | HR 92 | Temp 98.0°F | Ht 62.0 in | Wt 137.8 lb

## 2016-07-26 DIAGNOSIS — R112 Nausea with vomiting, unspecified: Secondary | ICD-10-CM | POA: Diagnosis not present

## 2016-07-26 DIAGNOSIS — R1013 Epigastric pain: Secondary | ICD-10-CM

## 2016-07-26 DIAGNOSIS — R195 Other fecal abnormalities: Secondary | ICD-10-CM

## 2016-07-26 DIAGNOSIS — G8929 Other chronic pain: Secondary | ICD-10-CM | POA: Diagnosis not present

## 2016-07-26 MED ORDER — LIDOCAINE VISCOUS 2 % MT SOLN: 15.0000 mL | Freq: Four times a day (QID) | OROMUCOSAL | 1 refills | Status: DC | PRN

## 2016-07-26 MED ORDER — DEXLANSOPRAZOLE 60 MG PO CPDR: 60.0000 mg | DELAYED_RELEASE_CAPSULE | Freq: Every day | ORAL | 0 refills | Status: DC

## 2016-07-26 NOTE — Patient Instructions (Signed)
1. I'm giving you samples of Dexilant (an acid blocker). Take this once a day, in the morning, on an empty stomach. 2. I've sent in viscous lidocaine. Drink 15 mL of this up to 3-4 times a day as needed for upper abdominal pain. 3. Bring your stool test back to our office 4. Return for follow-up in 2-4 weeks.

## 2016-07-26 NOTE — Progress Notes (Signed)
Referring Provider: No ref. provider found Primary Care Physician:  Bonney Aid, PA-C Primary GI:  Dr. Oneida Alar  Chief Complaint  Patient presents with  . Emesis    since 8/18 with blood  . Blood In Stools    HPI:   Jeanne Mcneil is a 30 y.o. female who presents for persistent nausea and vomiting as well as blood in stools. The patient has been seen approximately 8 times in the emergency department in the past 10 days (since 07/16/2016) or epigastric pain, nausea and vomiting. Her workup was essentially within benign each time. Urine drug screen positive for THC concerning for possible cannabinoid hyperemesis syndrome/cyclic vomiting syndrome. Recent EGD completed 05/17/2016 found LA grade a esophagitis and mild gastritis. Previously placed on AcipHex but stated "it is not strong enough". She does not appear to be on a PPI currently. Zofran wasn't effective for emergency department and Phenergan seem to help but stated she did not even try to fill it because it she knows it would be too expensive. She would told it would be about $9 and she stated she could afford that and would have it filled. Asked emergency department visit she was told she would not be given any narcotic pain medications DG drug-seeking behavior.  She was recently admitted to the hospital for nephrolithiasis and ureter stent placement by urology and should've had that removed recently. Was on antibiotics for this. Recent labs show persistent leukocytosis although improved from baseline, normal kidney and liver function, normal lipase. CT of the abdomen and pelvis completed 05/14/2016 found no acute abnormality identified, bilateral nonobstructing renal calculi increased from prior CT, uterine fibroid and endometrial abnormality. She has had 3 follow-up renal stones studies since then, most recently on 07/22/2016 which found bilateral nonobstructing renal calculi measuring up to 4 mm on the left with no ureteral or bladder  calculi, no hydronephrosis. No evidence of bowel obstruction, normal appendix. Liver with noted focal fat/altered perfusion lung the falciform ligament. No other acute process.  Today she states she got the Phenergan and "it doesn't work anyway. I'm in so much pain right now." Has appointment for stent removal tomorrow. States she's lost over 30 pounds subjectively in the past month. We last saw her 6/27 and her weight was 150 lb, today it is 137 lb. States she's never been on a PPI. Has taken generic Pepcid and Prilosec which has not helped. States she's never had Carafate although it is listed in her medications history. Has constant nausea. Vomits 4-5 times a day. Has seen [facility] about 2 weeks ago prior to ER visit. States it happens whenever she uses the bathroom "which isn't often because I can't eat anything." Urine "dark brown from blood with the stent." Abdominal pain is epigastric and periumbilical. "Everything makes it worse." And "pain killer" is the only thing that makes it better. Denies chest pain, dyspnea, dizziness, lightheadedness, syncope, near syncope. Denies any other upper or lower GI symptoms.  States marijuana is only when she's nauseated and it doesn't make her sick. Issues only with eating, which seems to contradict what she previously stated.  Past Medical History:  Diagnosis Date  . Abdominal pain, chronic, epigastric   . Esophagitis   . Fibroid uterus    fibroid lymphoma  . Gastritis   . Glaucoma   . Hypercalcuria   . Kidney stones     Past Surgical History:  Procedure Laterality Date  . CYSTOSCOPY W/ URETERAL STENT PLACEMENT Right 07/04/2016  Procedure: CYSTOSCOPY WITH RIGHT RETROGRADE PYELOGRAM, RIGHT URETERAL STENT PLACEMENT;  Surgeon: Ardis Hughs, MD;  Location: [facility] ORS;  Service: Urology;  Laterality: Right;  . ESOPHAGOGASTRODUODENOSCOPY N/A 05/17/2016   Procedure: ESOPHAGOGASTRODUODENOSCOPY (EGD);  Surgeon: Danie Binder, MD;  Location: [facility];   Service: Endoscopy;  Laterality: N/A;  830   . kidney stents      Current Outpatient Prescriptions  Medication Sig Dispense Refill  . busPIRone (BUSPAR) 5 MG tablet Take 5 mg by mouth 2 (two) times daily.    . cephALEXin (KEFLEX) 500 MG capsule Take 1 capsule (500 mg total) by mouth 4 (four) times daily. 28 capsule 0  . dicyclomine (BENTYL) 20 MG tablet Take 1 tablet (20 mg total) by mouth every 6 (six) hours as needed for spasms (abdominal cramping). 15 tablet 0  . indomethacin (INDOCIN) 50 MG capsule Take 1 capsule (50 mg total) by mouth 3 (three) times daily with meals. (Patient taking differently: Take 50 mg by mouth 2 (two) times daily as needed for moderate pain. ) 20 capsule 0  . oxybutynin (DITROPAN) 5 MG tablet Take 1 tablet (5 mg total) by mouth 3 (three) times daily. 30 tablet 0  . promethazine (PHENERGAN) 25 MG tablet Take 1 tablet (25 mg total) by mouth every 6 (six) hours as needed for nausea or vomiting. 30 tablet 0  . sertraline (ZOLOFT) 50 MG tablet Take 50 mg by mouth daily.     No current facility-administered medications for this visit.     Allergies as of 07/26/2016 - Review Complete 07/26/2016  Allergen Reaction Noted  . Keflex [cephalexin] Other (See Comments) 07/25/2016  . Codeine Hives, Itching, and Other (See Comments) 05/17/2013  . Percocet [oxycodone-acetaminophen] Hives and Itching 09/16/2012  . Vicodin [hydrocodone-acetaminophen] Hives and Itching 09/16/2012    Family History  Problem Relation Age of Onset  . Colon cancer Paternal Uncle     Passed away 23-Jan-2015    Social History   Social History  . Marital status: Single    Spouse name: N/A  . Number of children: N/A  . Years of education: N/A   Social History Main Topics  . Smoking status: Current Every Day Smoker    Packs/day: 1.00    Types: Cigarettes  . Smokeless tobacco: Never Used     Comment: 3-4 cigarettes daily  . Alcohol use 2.4 oz/week    4 Shots of liquor per week     Comment: No  ETOH in 4 months; Previously: occasionally, once every 2-3 months.  . Drug use: No     Comment: "Only when I feel nauseated and can't function"  . Sexual activity: Yes    Birth control/ protection: None   Other Topics Concern  . None   Social History Narrative  . None    Review of Systems: 10-point ROS negative except as per HPI.   Physical Exam: BP (!) 153/104   Pulse 92   Temp 98 F (36.7 C) (Oral)   Ht 5' 2"  (1.575 m)   Wt 137 lb 12.8 oz (62.5 kg)   LMP 07/11/2016 (Exact Date) Comment: NEG U preg today  BMI 25.20 kg/m  General:   Alert and oriented. Pleasant and cooperative. Well-nourished and well-developed.  Head:  Normocephalic and atraumatic. Eyes:  Without icterus, sclera clear and conjunctiva pink.  Ears:  Normal auditory acuity. Cardiovascular:  S1, S2 present without murmurs appreciated. Extremities without clubbing or edema. Respiratory:  Clear to auscultation bilaterally. No wheezes, rales, or  rhonchi. No distress.  Gastrointestinal:  +BS, soft, and non-distended. Generalized abdominal TTP. No HSM noted. No guarding or rebound. No masses appreciated.  Rectal:  Deferred  Musculoskalatal:  Symmetrical without gross deformities. Neurologic:  Alert and oriented x4;  grossly normal neurologically. Psych:  Alert and cooperative. Normal mood and affect. Heme/Lymph/Immune: No excessive bruising noted.    07/26/2016 3:25 PM   Disclaimer: This note was dictated with voice recognition software. Similar sounding words can inadvertently be transcribed and may not be corrected upon review.

## 2016-07-26 NOTE — Assessment & Plan Note (Signed)
Patient describes persistent nausea and vomiting. States Phenergan and Zofran did not help. She does smoke marijuana and the question of cannabinoid hyperemesis syndrome has been raised. Some element of nausea and vomiting likely due to exacerbated GERD, esophagitis, gastritis. We will attempt to better manage her symptoms from these aspect as noted above. Return for follow-up in 2-4 weeks.

## 2016-07-26 NOTE — Assessment & Plan Note (Addendum)
Patient with seemingly persistent and chronic epigastric pain. There is inconsistency in the medication she has taken as she seems to be somewhat noncompliant. Has tried Prilosec and Pepcid over-the-counter without efficacy. At this point her epigastric pain seems most likely due to GERD, gastritis, esophagitis. This is demonstrated on her recent EGD which was otherwise normal, which is reassuring. She has had an extensive evaluation in the emergency department including multiple CAT scans, hospital admission, multiple labs. This point I'll start her on Dexilant 60 mg daily samples to last 2 weeks. I will also send an viscous lidocaine 15 mL every 8 hours as needed for pain. She is to call us in 1-1/2 weeks and let us know how she is doing. Return for follow-up in 2-4 weeks. If she continues with the same symptoms she may need tertiary care referral.

## 2016-07-26 NOTE — Assessment & Plan Note (Signed)
Patient notes hematochezia but is a poor historian and unable to really state how often or how many times. She did have heme positive stool in the emergency department. At this point I'll check an iFOBT to follow-up. She is 30 years old. Can consider colonoscopy depending on symptom progression. Return for follow-up in 2-4 weeks.

## 2016-07-26 NOTE — Progress Notes (Signed)
CC'ED TO PCP 

## 2016-07-26 NOTE — Addendum Note (Signed)
Addended by: Marlou Porch on: 07/26/2016 04:00 PM   Modules accepted: Orders

## 2016-07-31 ENCOUNTER — Other Ambulatory Visit: Payer: Self-pay | Admitting: Urology

## 2016-08-01 ENCOUNTER — Encounter (HOSPITAL_BASED_OUTPATIENT_CLINIC_OR_DEPARTMENT_OTHER): Payer: Self-pay | Admitting: *Deleted

## 2016-08-01 NOTE — Progress Notes (Signed)
NPO AFTER MN.  ARRIVE AT 1000.  CURRENT LAB RESULTS IN CHART AND EPIC.  WILL TAKE DEXILANT, ZOLOFT, AND BUSPAR AM DOS W/ SIPS OF WATER AND IF NEEDED TAKE TYLENOL.

## 2016-08-02 ENCOUNTER — Encounter (HOSPITAL_BASED_OUTPATIENT_CLINIC_OR_DEPARTMENT_OTHER)
Admission: RE | Disposition: A | Discharge status: Home / Self Care | Payer: Self-pay | Source: Ambulatory Visit | Attending: Urology

## 2016-08-02 ENCOUNTER — Ambulatory Visit (HOSPITAL_BASED_OUTPATIENT_CLINIC_OR_DEPARTMENT_OTHER): Payer: Medicaid Other | Admitting: Anesthesiology

## 2016-08-02 ENCOUNTER — Ambulatory Visit (HOSPITAL_BASED_OUTPATIENT_CLINIC_OR_DEPARTMENT_OTHER)
Admission: RE | Admit: 2016-08-02 | Discharge: 2016-08-02 | Disposition: A | Discharge status: Home / Self Care | Payer: Medicaid Other | Source: Ambulatory Visit | Attending: Urology | Admitting: Urology

## 2016-08-02 ENCOUNTER — Encounter (HOSPITAL_BASED_OUTPATIENT_CLINIC_OR_DEPARTMENT_OTHER): Payer: Self-pay | Admitting: *Deleted

## 2016-08-02 DIAGNOSIS — Z8051 Family history of malignant neoplasm of kidney: Secondary | ICD-10-CM | POA: Insufficient documentation

## 2016-08-02 DIAGNOSIS — K219 Gastro-esophageal reflux disease without esophagitis: Secondary | ICD-10-CM | POA: Diagnosis not present

## 2016-08-02 DIAGNOSIS — Z841 Family history of disorders of kidney and ureter: Secondary | ICD-10-CM | POA: Insufficient documentation

## 2016-08-02 DIAGNOSIS — G4730 Sleep apnea, unspecified: Secondary | ICD-10-CM | POA: Diagnosis not present

## 2016-08-02 DIAGNOSIS — I10 Essential (primary) hypertension: Secondary | ICD-10-CM | POA: Diagnosis not present

## 2016-08-02 DIAGNOSIS — F17200 Nicotine dependence, unspecified, uncomplicated: Secondary | ICD-10-CM | POA: Diagnosis not present

## 2016-08-02 DIAGNOSIS — N201 Calculus of ureter: Secondary | ICD-10-CM | POA: Diagnosis present

## 2016-08-02 HISTORY — DX: Dorsalgia, unspecified: M54.9

## 2016-08-02 HISTORY — DX: Major depressive disorder, single episode, unspecified: F32.9

## 2016-08-02 HISTORY — PX: CYSTOSCOPY WITH RETROGRADE PYELOGRAM, URETEROSCOPY AND STENT PLACEMENT: SHX5789

## 2016-08-02 HISTORY — DX: Personal history of urinary calculi: Z87.442

## 2016-08-02 HISTORY — DX: Leiomyoma of uterus, unspecified: D25.9

## 2016-08-02 HISTORY — DX: Anxiety disorder, unspecified: F41.9

## 2016-08-02 HISTORY — DX: Constipation, unspecified: K59.00

## 2016-08-02 HISTORY — DX: Hematuria, unspecified: R31.9

## 2016-08-02 HISTORY — DX: Gastro-esophageal reflux disease without esophagitis: K21.9

## 2016-08-02 HISTORY — DX: Calculus of kidney: N20.0

## 2016-08-02 HISTORY — DX: Personal history of other diseases of the digestive system: Z87.19

## 2016-08-02 HISTORY — DX: Personal history of other diseases of the female genital tract: Z87.42

## 2016-08-02 HISTORY — DX: Depression, unspecified: F32.A

## 2016-08-02 HISTORY — DX: Unspecified hydronephrosis: N13.30

## 2016-08-02 HISTORY — DX: Urgency of urination: R39.15

## 2016-08-02 HISTORY — DX: Calculus of ureter: N20.1

## 2016-08-02 HISTORY — DX: Unspecified glaucoma: H40.9

## 2016-08-02 SURGERY — CYSTOURETEROSCOPY, WITH RETROGRADE PYELOGRAM AND STENT INSERTION
Anesthesia: General | Site: Renal | Laterality: Right

## 2016-08-02 MED ORDER — IOHEXOL 300 MG/ML  SOLN
INTRAMUSCULAR | Status: DC | PRN
  Administered 2016-08-02: 20 mL

## 2016-08-02 MED ORDER — PROPOFOL 10 MG/ML IV BOLUS
INTRAVENOUS | Status: AC
  Filled 2016-08-02: qty 20

## 2016-08-02 MED ORDER — HYDROMORPHONE HCL 1 MG/ML IJ SOLN
0.2500 mg | INTRAMUSCULAR | Status: DC | PRN
  Filled 2016-08-02: qty 1

## 2016-08-02 MED ORDER — PROPOFOL 10 MG/ML IV BOLUS
INTRAVENOUS | Status: AC
Start: 2016-08-02 — End: 2016-08-02
  Filled 2016-08-02: qty 20

## 2016-08-02 MED ORDER — FENTANYL CITRATE (PF) 100 MCG/2ML IJ SOLN
INTRAMUSCULAR | Status: AC
  Filled 2016-08-02: qty 2

## 2016-08-02 MED ORDER — PROMETHAZINE HCL 25 MG/ML IJ SOLN
6.2500 mg | INTRAMUSCULAR | Status: DC | PRN
  Filled 2016-08-02: qty 1

## 2016-08-02 MED ORDER — DEXAMETHASONE SODIUM PHOSPHATE 4 MG/ML IJ SOLN
INTRAMUSCULAR | Status: DC | PRN
Start: 2016-08-02 — End: 2016-08-02
  Administered 2016-08-02: 10 mg via INTRAVENOUS

## 2016-08-02 MED ORDER — PROPOFOL 10 MG/ML IV BOLUS
INTRAVENOUS | Status: DC | PRN
  Administered 2016-08-02: 50 mg via INTRAVENOUS
  Administered 2016-08-02: 200 mg via INTRAVENOUS
  Administered 2016-08-02: 100 mg via INTRAVENOUS

## 2016-08-02 MED ORDER — MEPERIDINE HCL 25 MG/ML IJ SOLN
6.2500 mg | INTRAMUSCULAR | Status: DC | PRN
  Filled 2016-08-02: qty 1

## 2016-08-02 MED ORDER — ONDANSETRON HCL 4 MG/2ML IJ SOLN
INTRAMUSCULAR | Status: AC
  Filled 2016-08-02: qty 2

## 2016-08-02 MED ORDER — PHENAZOPYRIDINE HCL 200 MG PO TABS: 200.0000 mg | ORAL_TABLET | Freq: Three times a day (TID) | ORAL | 0 refills | Status: DC | PRN

## 2016-08-02 MED ORDER — MIDAZOLAM HCL 5 MG/5ML IJ SOLN
INTRAMUSCULAR | Status: DC | PRN
  Administered 2016-08-02: 2 mg via INTRAVENOUS

## 2016-08-02 MED ORDER — CIPROFLOXACIN IN D5W 400 MG/200ML IV SOLN
400.0000 mg | INTRAVENOUS | Status: AC
  Administered 2016-08-02: 400 mg via INTRAVENOUS
  Filled 2016-08-02: qty 200

## 2016-08-02 MED ORDER — ONDANSETRON HCL 4 MG/2ML IJ SOLN
INTRAMUSCULAR | Status: DC | PRN
  Administered 2016-08-02: 4 mg via INTRAVENOUS

## 2016-08-02 MED ORDER — LIDOCAINE 2% (20 MG/ML) 5 ML SYRINGE: INTRAMUSCULAR | Status: DC | PRN

## 2016-08-02 MED ORDER — LACTATED RINGERS IV SOLN
INTRAVENOUS | Status: DC
  Administered 2016-08-02 (×2): via INTRAVENOUS
  Filled 2016-08-02: qty 1000

## 2016-08-02 MED ORDER — FENTANYL CITRATE (PF) 100 MCG/2ML IJ SOLN
INTRAMUSCULAR | Status: DC | PRN
  Administered 2016-08-02: 25 ug via INTRAVENOUS
  Administered 2016-08-02: 50 ug via INTRAVENOUS
  Administered 2016-08-02 (×3): 25 ug via INTRAVENOUS
  Administered 2016-08-02: 50 ug via INTRAVENOUS

## 2016-08-02 MED ORDER — CIPROFLOXACIN IN D5W 400 MG/200ML IV SOLN
INTRAVENOUS | Status: AC
  Filled 2016-08-02: qty 200

## 2016-08-02 MED ORDER — TRAMADOL HCL 50 MG PO TABS: 50.0000 mg | ORAL_TABLET | Freq: Four times a day (QID) | ORAL | 0 refills | Status: DC | PRN

## 2016-08-02 MED ORDER — MIDAZOLAM HCL 2 MG/2ML IJ SOLN
INTRAMUSCULAR | Status: AC
  Filled 2016-08-02: qty 2

## 2016-08-02 MED ORDER — DEXAMETHASONE SODIUM PHOSPHATE 10 MG/ML IJ SOLN
INTRAMUSCULAR | Status: AC
  Filled 2016-08-02: qty 1

## 2016-08-02 MED ORDER — LIDOCAINE HCL (CARDIAC) 20 MG/ML IV SOLN
INTRAVENOUS | Status: DC | PRN
  Administered 2016-08-02: 100 mg via INTRAVENOUS

## 2016-08-02 MED ORDER — SODIUM CHLORIDE 0.9 % IR SOLN
Status: DC | PRN
  Administered 2016-08-02: 4000 mL

## 2016-08-02 MED ORDER — CIPROFLOXACIN HCL 500 MG PO TABS
500.0000 mg | ORAL_TABLET | Freq: Once | ORAL | 0 refills | Status: AC
Start: 2016-08-02 — End: 2016-08-02

## 2016-08-02 MED ORDER — LIDOCAINE 2% (20 MG/ML) 5 ML SYRINGE
INTRAMUSCULAR | Status: AC
  Filled 2016-08-02: qty 5

## 2016-08-02 MED ORDER — GENTAMICIN SULFATE 40 MG/ML IJ SOLN
5.0000 mg/kg | INTRAVENOUS | Status: AC
  Administered 2016-08-02: 280 mg via INTRAVENOUS
  Filled 2016-08-02: qty 7

## 2016-08-02 SURGICAL SUPPLY — 30 items
BAG DRAIN URO-CYSTO SKYTR STRL (DRAIN) ×3 IMPLANT
BASKET DAKOTA 1.9FR 11X120 (BASKET) IMPLANT
BASKET LASER NITINOL 1.9FR (BASKET) IMPLANT
BASKET STNLS GEMINI 4WIRE 3FR (BASKET) IMPLANT
BASKET STONE 1.7 NGAGE (UROLOGICAL SUPPLIES) ×3 IMPLANT
BASKET ZERO TIP NITINOL 2.4FR (BASKET) IMPLANT
CATH URET 5FR 28IN OPEN ENDED (CATHETERS) ×3 IMPLANT
CATH URET DUAL LUMEN 6-10FR 50 (CATHETERS) IMPLANT
CLOTH BEACON ORANGE TIMEOUT ST (SAFETY) ×3 IMPLANT
FIBER LASER TRAC TIP (UROLOGICAL SUPPLIES) IMPLANT
GLOVE BIO SURGEON STRL SZ7.5 (GLOVE) ×3 IMPLANT
GOWN STRL REUS W/ TWL XL LVL3 (GOWN DISPOSABLE) ×1 IMPLANT
GOWN STRL REUS W/TWL XL LVL3 (GOWN DISPOSABLE) ×2
GUIDEWIRE 0.038 PTFE COATED (WIRE) IMPLANT
GUIDEWIRE ANG ZIPWIRE 038X150 (WIRE) IMPLANT
GUIDEWIRE STR DUAL SENSOR (WIRE) ×6 IMPLANT
IV NS IRRIG 3000ML ARTHROMATIC (IV SOLUTION) ×6 IMPLANT
KIT BALLIN UROMAX 15FX10 (LABEL) IMPLANT
KIT BALLN UROMAX 15FX4 (MISCELLANEOUS) IMPLANT
KIT BALLN UROMAX 26 75X4 (MISCELLANEOUS)
KIT ROOM TURNOVER WOR (KITS) ×3 IMPLANT
MANIFOLD NEPTUNE II (INSTRUMENTS) IMPLANT
NS IRRIG 500ML POUR BTL (IV SOLUTION) IMPLANT
PACK CYSTO (CUSTOM PROCEDURE TRAY) ×3 IMPLANT
SET HIGH PRES BAL DIL (LABEL)
SHEATH ACCESS URETERAL 38CM (SHEATH) IMPLANT
STENT URET 6FRX24 CONTOUR (STENTS) ×6 IMPLANT
TUBE CONNECTING 12'X1/4 (SUCTIONS)
TUBE CONNECTING 12X1/4 (SUCTIONS) IMPLANT
TUBE FEEDING 8FR 16IN STR KANG (MISCELLANEOUS) IMPLANT

## 2016-08-02 NOTE — Transfer of Care (Signed)
Immediate Anesthesia Transfer of Care Note  Patient: Jeanne Mcneil  Procedure(s) Performed: Procedure(s) (LRB): CYSTOSCOPY WITH RIGHT  RETROGRADE PYELOGRAM, URETEROSCOPY,  AND STENT EXCHANGE (Right)  Patient Location: PACU  Anesthesia Type: General  Level of Consciousness: awake, sedated, patient cooperative and responds to stimulation  Airway & Oxygen Therapy: Patient Spontanous Breathing and Patient connected to [place] oxygen  Post-op Assessment: Report given to PACU RN, Post -op Vital signs reviewed and stable and Patient moving all extremities  Post vital signs: Reviewed and stable  Complications: No apparent anesthesia complications

## 2016-08-02 NOTE — Op Note (Signed)
Preoperative diagnosis: right ureteral calculus  Postoperative diagnosis: right ureteral calculus  Procedure:  1. Cystoscopy 2. right ureteroscopy and stone removal 3. right 26F x 24 ureteral stent exchange  4. right retrograde pyelography with interpretation  Surgeon: Ardis Hughs, MD  Anesthesia: General  Complications: None  Intraoperative findings: The patient's ureteral stent was encrusted and somewhat difficult to remove, although it was removed intact. A right retrograde pyelography demonstrated a filling defect within the right proximalt ureter consistent with the patient's known calculus without other abnormalities.  EBL: Minimal  Specimens: 1. right ureteral calculus  Disposition of specimens: [facility] for stone analysis  Indication: Jeanne Mcneil is a 30 y.o.   patient with a history of a right ureteral stone with poorly controlled pain and evidence of a urinary tract infection with impending sepsis. She subsequently underwent a stent placement approximately one month ago. She presents today for further management. After reviewing the management options for treatment, the patient elected to proceed with the above surgical procedure(s). We have discussed the potential benefits and risks of the procedure, side effects of the proposed treatment, the likelihood of the patient achieving the goals of the procedure, and any potential problems that might occur during the procedure or recuperation. Informed consent has been obtained.   Description of procedure:  The patient was taken to the operating room and general anesthesia was induced.  The patient was placed in the dorsal lithotomy position, prepped and draped in the usual sterile fashion, and preoperative antibiotics were administered. A preoperative time-out was performed.   Cystourethroscopy was performed.  The patient's urethra was examined and was  normal. The bladder was then systematically  examined in its entirety. There was no evidence for any bladder tumors, stones, or other mucosal pathology.  The patient's stent was encrusted at the distal aspect and there was significant edema around the patient's right ureteral orifice. I was able to manipulate the encrusted stent enough to pull down to the urethral meatus and then passed a 0.38 sensor wire through the stent. I had to pass the wire up backwards so the stiffer aspect of the wire would break through some of the encrustation within and along the stent.  Once distended and completely removed the stent was exchanged for a 5 [place] opened ureteral catheter and retrograde pyelogram was performed with the above findings. I then reintroduced the 0.38 sensor wire through the open-ended urethral catheter backed the catheter out over the wire. The 6 Fr semirigid ureteroscope was then advanced into the ureter next to the guidewire and the calculus was identified.   The stone was at the proximal ureter/UPJ area and upon attempt to basket the stone the stone was pushed into the renal pelvis. I then advanced a second wire through the rigid ureteroscope and backed the ureteroscope out over the wire. I then advanced a flexible ureteroscope over the wire and into the right proximal ureter. Pyeloscopy was then performed and the stones identified.  All stones were then removed from the ureter with an N-gage nitinol basket.  Reinspection of the ureter revealed no remaining visible stones or fragments.   The wire was then backloaded through the cystoscope and a ureteral stent was advance over the wire using Seldinger technique.  The stent was positioned appropriately under fluoroscopic and cystoscopic guidance.  The wire was then removed with an adequate stent curl noted in the renal pelvis as well as in the bladder.  The bladder was then emptied and the  procedure ended.  The patient appeared to tolerate the procedure well and without complications.  The  patient was able to be awakened and transferred to the recovery unit in satisfactory condition.   Disposition: The tether of the stent was left on and tucked inside the patient's vagina.  Instructions for removing the stent have been provided to the patient. This has been scheduled for followup in 6 weeks with a renal ultrasound.

## 2016-08-02 NOTE — Anesthesia Procedure Notes (Signed)
Procedure Name: LMA Insertion Date/Time: 08/02/2016 12:01 PM Performed by: Justice Rocher Pre-anesthesia Checklist: Patient identified, Emergency Drugs available, Suction available and Patient being monitored Patient Re-evaluated:Patient Re-evaluated prior to inductionOxygen Delivery Method: Circle system utilized Preoxygenation: Pre-oxygenation with 100% oxygen Intubation Type: IV induction Ventilation: Mask ventilation without difficulty LMA: LMA inserted LMA Size: 4.0 Number of attempts: 1 Airway Equipment and Method: Bite block Placement Confirmation: positive ETCO2 Tube secured with: Tape Dental Injury: Teeth and Oropharynx as per pre-operative assessment

## 2016-08-02 NOTE — H&P (Signed)
f/u for obstructing stone  HPI: Jeanne Mcneil is a 30 year-old female established patient who is here for further eval and management of an obstructing stone.  The patient was last seen 07/04/16.   The patient's stone is on her right side. The stone was 50m in proximal ureter. There are additional stones within the urinary tract. They are located non-obstrucing stones bilaterally.   The patient has not passed their stone since their last visit. The patient is complaining of groin pain and progressive voiding symptoms. The patient denies fevers, chills, nausea, vomiting, flank pain, groin pain, or progressive voiding symptoms. The patient underwent no imaging prior to today's appointment.   [facility]. She has been to ED twice since with stomach pain and left flank pain. CT scans have been unremarkable.     ALLERGIES: Codeine - Itching, Hives Dust - Itching Grass Pollen - Itching, sneezing    MEDICATIONS: Oxybutynin Chloride 5 mg tablet  Buspirone Hcl 5 mg tablet  Cephalexin 500 mg capsule  Dexilant 60 mg capsule, delayed release, biphasic  Dicyclomine Hcl 20 mg tablet  Indomethacin 50 mg capsule  Promethazine Hcl 25 mg tablet  Sertraline Hcl 50 mg tablet     GU PSH: Cystoscopy Insert Stent - 2009 Cystoscopy Ureteroscopy - 2009      PSH Notes: Cystoscopy With Ureteroscopy Left, Cystoscopy With Insertion Of Ureteral Stent Left, Surgically Induced Abortion - By Dilation And Evacuation   NON-GU PSH: Abortion - 2009    GU PMH: Calculus Ureter, Calculus of left ureter - 2014 Hematuria, Unspec, Hematuria - 2014 Hydronephrosis Unspec, Hydronephrosis - 2014      PMH Notes:  1898-11-20 00:00:00 - Note: Normal Routine History And Physical Adult  2007-11-26 16:16:37 - Note: Urinary Calculus On The Left   NON-GU PMH: Personal history of other diseases of the nervous system and sense organs, History of glaucoma - 2014 Acute  gastric ulcer with hemorrhage Anxiety disorder, unspecified Depression Essential (primary) hypertension Gastro-esophageal reflux disease without esophagitis Other specified glaucoma Sleep apnea, unspecified    FAMILY HISTORY: 1 Daughter - Daughter 1 son - Son Blood In Urine - Grandfather, Uncle Kidney Cancer - Grandfather nephrolithiasis - Uncle, Grandfather Prostate Cancer - Uncle renal failure - Grandfather   SOCIAL HISTORY: Marital Status: Single Current Smoking Status: Patient smokes. Has smoked since 07/22/2003. Smokes less than 1/2 pack per day.  Has never drank.  Drinks 1 caffeinated drink per day. Patient's occupation iPharmacist, hospital     Notes: Caffeine Use, Marital History - Single, Tobacco Use, Alcohol Use   REVIEW OF SYSTEMS:    GU Review Female:   Patient denies frequent urination, hard to postpone urination, burning /pain with urination, get up at night to urinate, leakage of urine, stream starts and stops, trouble starting your stream, have to strain to urinate, and currently pregnant.  Gastrointestinal (Upper):   Patient reports nausea, vomiting, and indigestion/ heartburn.   Gastrointestinal (Lower):   Patient reports constipation and diarrhea.   Constitutional:   Patient reports night sweats, weight loss, and fatigue. Patient denies fever.  Skin:   Patient reports skin rash/ lesion. Patient denies itching.  Eyes:   Patient reports blurred vision. Patient denies double vision.  Ears/ Nose/ Throat:   Patient reports sore throat. Patient denies sinus problems.  Hematologic/Lymphatic:   Patient reports easy bruising. Patient denies swollen glands.  Cardiovascular:   Patient reports chest pains. Patient denies leg swelling.  Respiratory:  Patient reports shortness of breath. Patient denies cough.  Endocrine:   Patient reports excessive thirst.   Musculoskeletal:   Patient reports back pain. Patient denies joint pain.  Neurological:   Patient reports  headaches and dizziness.   Psychologic:   Patient reports depression and anxiety.    VITAL SIGNS:      07/27/2016 12:54 PM  Weight 136 lb / 61.69 kg  Height 62 in / 157.48 cm  BP 111/68 mmHg  Pulse 97 /min  Temperature 98.2 F / 37 C  BMI 24.9 kg/m   MULTI-SYSTEM PHYSICAL EXAMINATION:    Constitutional: Well-nourished. No physical deformities. Normally developed. Good grooming.  Respiratory: No labored breathing, no use of accessory muscles. CTA  Cardiovascular: Normal temperature, normal extremity pulses, no swelling, no varicosities. RRR     PAST DATA REVIEWED:  Source Of History:  Patient  Records Review:   Previous Doctor Records, Previous Patient Records  Urine Test Review:   Urinalysis, Urine Culture and Sensitivity  X-Ray Review: C.T. Abdomen/Pelvis: Reviewed Films. Discussed With Patient.     PROCEDURES:          Urinalysis w/Scope - 81001 Dipstick Dipstick Cont'd Micro  Specimen: Voided Bilirubin: Neg WBC/hpf: 20-40/hpf  Color: Red Ketones: 1+ RBC/hpf: >60/hpf  Appearance: Cloudy Blood: 3+ Bacteria: Moderate (26-50/hpf)  Specific Gravity: 1.025 Protein: 3+ Cystals: NS (Not Seen)  pH: 6.0 Urobilinogen: 1.0 Casts: NS (Not Seen)  Glucose: Neg Nitrites: Positive Trichomonas: Not Present    Leukocyte Esterase: 3+ Mucous: Present      Epithelial Cells: 0-5/hpf      Yeast: NS (Not Seen)      Sperm: Not Present    ASSESSMENT:      ICD-10 Details  1 GU:   Calculus Kidney and Ureter - N20.2    PLAN:           Orders Labs Urine Culture and Sensitivity          Document Letter(s):  Created for Patient: Clinical Summary    I went over the treatment options for their stone. We discussed ongoing medical expulsion therapy, ESWL and ureteroscopy. Ultimately the patient and I agreed that ureterscopy is the best option. I went over this surgery with the patient in detail. The patient understands after being put to sleep, we would proceed with a telescope to access the  stone and potentially use a laser to fragment the stone before removing it with a basket. After removing the stone the patient will require temporary stent placement in the ureter. This is an outpatient procedure. I also discussed the potential of not being able to gain access safely into the ureter/kidney. This would require that a stent be placed and then the patient rescheduled several weeks later for a second attempt. They also understand the small risks of ureteral trauma causing a stricture or permanent damage. I also explained the risk of urinary tract infection. Having gone over the procedure itself, the expected outcome, and the risks/benefits the patient has agreed to proceed.

## 2016-08-02 NOTE — Discharge Instructions (Signed)
°  Post Anesthesia Home Care Instructions  Activity: Get plenty of rest for the remainder of the day. A responsible adult should stay with you for 24 hours following the procedure.  For the next 24 hours, DO NOT: -Drive a car -Paediatric nurse -Drink alcoholic beverages -Take any medication unless instructed by your physician -Make any legal decisions or sign important papers.  Meals: Start with liquid foods such as gelatin or soup. Progress to regular foods as tolerated. Avoid greasy, spicy, heavy foods. If nausea and/or vomiting occur, drink only clear liquids until the nausea and/or vomiting subsides. Call your physician if vomiting continues.  Special Instructions/Symptoms: Your throat may feel dry or sore from the anesthesia or the breathing tube placed in your throat during surgery. If this causes discomfort, gargle with warm salt water. The discomfort should disappear within 24 hours.  If you had a scopolamine patch placed behind your ear for the management of post- operative nausea and/or vomiting:  1. The medication in the patch is effective for 72 hours, after which it should be removed.  Wrap patch in a tissue and discard in the trash. Wash hands thoroughly with soap and water. 2. You may remove the patch earlier than 72 hours if you experience unpleasant side effects which may include dry mouth, dizziness or visual disturbances. 3. Avoid touching the patch. Wash your hands with soap and water after contact with the patch.   DISCHARGE INSTRUCTIONS FOR KIDNEY STONE/URETERAL STENT   MEDICATIONS:  1.  Resume all your other meds from home - except do not take any extra narcotic pain meds that you may have at home.  2. Pyridium is to help with the burning/stinging when you urinate. 3. Tramadol is for moderate/severe pain, otherwise taking upto 1000 mg every 6 hours of plainTylenol will help treat your pain.   4. Take Cipro one hour prior to removal of your stent.   ACTIVITY:  1.  No strenuous activity x 1week  2. No driving while on narcotic pain medications  3. Drink plenty of water  4. Continue to walk at home - you can still get blood clots when you are at home, so keep active, but don't over do it.  5. May return to work/school tomorrow or when you feel ready   BATHING:  1. You can shower and we recommend daily showers  2. You have a string coming from your urethra: The stent string is attached to your ureteral stent. Do not pull on this.   SIGNS/SYMPTOMS TO CALL:  Please call us if you have a fever greater than 101.5, uncontrolled nausea/vomiting, uncontrolled pain, dizziness, unable to urinate, bloody urine, chest pain, shortness of breath, leg swelling, leg pain, redness around wound, drainage from wound, or any other concerns or questions.   You can reach [place] at [phone].   FOLLOW-UP:  1. You have an appointment in 6 weeks with a ultrasound of your kidneys prior.   2. You have a string attached to your stent, you may remove it on  Monday Sept 18th . To do this, pull the strings until the stents are completely removed. You may feel an odd sensation in your back.

## 2016-08-02 NOTE — Anesthesia Preprocedure Evaluation (Addendum)
Anesthesia Evaluation  Patient identified by MRN, date of birth, ID band Patient awake  General Assessment Comment:Patient not on a beta blocker.  Reviewed: Allergy & Precautions, NPO status , Patient's Chart, lab work & pertinent test results  Airway Mallampati: II  TM Distance: >3 FB Neck ROM: Full    Dental  (+) Teeth Intact   Pulmonary Current Smoker,    breath sounds clear to auscultation       Cardiovascular negative cardio ROS   Rhythm:Regular Rate:Normal     Neuro/Psych PSYCHIATRIC DISORDERS Anxiety Depression    GI/Hepatic Neg liver ROS, hiatal hernia, GERD  Medicated and Poorly Controlled,  Endo/Other    Renal/GU Renal diseaseRenal calculi, mild hydronephrosis, UTI     Musculoskeletal   Abdominal   Peds  Hematology   Anesthesia Other Findings   Reproductive/Obstetrics                             Anesthesia Physical  Anesthesia Plan  ASA: II  Anesthesia Plan: General   Post-op Pain Management:    Induction: Intravenous  Airway Management Planned: LMA  Additional Equipment:   Intra-op Plan:   Post-operative Plan: Extubation in OR  Informed Consent: I have reviewed the patients History and Physical, chart, labs and discussed the procedure including the risks, benefits and alternatives for the proposed anesthesia with the patient or authorized representative who has indicated his/her understanding and acceptance.   Dental advisory given  Plan Discussed with: CRNA  Anesthesia Plan Comments:         Anesthesia Quick Evaluation

## 2016-08-03 ENCOUNTER — Encounter (HOSPITAL_BASED_OUTPATIENT_CLINIC_OR_DEPARTMENT_OTHER): Payer: Self-pay | Admitting: Urology

## 2016-08-03 LAB — URINE CULTURE: CULTURE: NO GROWTH

## 2016-08-03 NOTE — Anesthesia Postprocedure Evaluation (Signed)
Anesthesia Post Note  Patient: Jeanne Mcneil  Procedure(s) Performed: Procedure(s) (LRB): CYSTOSCOPY WITH RIGHT  RETROGRADE PYELOGRAM, URETEROSCOPY,  AND STENT EXCHANGE (Right)  Patient location during evaluation: PACU Anesthesia Type: General Level of consciousness: awake Pain management: pain level controlled Vital Signs Assessment: post-procedure vital signs reviewed and stable Respiratory status: spontaneous breathing Cardiovascular status: stable Postop Assessment: no signs of nausea or vomiting Anesthetic complications: no    Last Vitals:  Vitals:   08/02/16 1345 08/02/16 1415  BP: 138/86 (!) 131/97  Pulse: 78 72  Resp: (!) 23 20  Temp:  36.8 C    Last Pain:  Vitals:   08/02/16 1350  TempSrc:   PainSc: 0-No pain                 Shaira Sova

## 2016-08-09 ENCOUNTER — Telehealth: Payer: Self-pay

## 2016-08-09 NOTE — Telephone Encounter (Signed)
Pt called and said she ran out of Dexilant samples that she was given. She has an upcoming appt with [facility] on 08/31/16. She wants to know if she can get more Dexilant or if she needs to wait until upcoming appt?

## 2016-08-10 NOTE — Telephone Encounter (Signed)
We are currently out of Dexilant samples. We can send in an Rx to her pharmacy if she would like.

## 2016-08-10 NOTE — Telephone Encounter (Signed)
Called and left message to be given to pt.

## 2016-08-26 ENCOUNTER — Emergency Department (HOSPITAL_COMMUNITY)
Admission: EM | Admit: 2016-08-26 | Discharge: 2016-08-26 | Disposition: A | Discharge status: Home / Self Care | Payer: Medicaid Other | Attending: Physician Assistant | Admitting: Physician Assistant

## 2016-08-26 ENCOUNTER — Encounter (HOSPITAL_COMMUNITY): Payer: Self-pay | Admitting: Emergency Medicine

## 2016-08-26 DIAGNOSIS — R112 Nausea with vomiting, unspecified: Secondary | ICD-10-CM | POA: Diagnosis not present

## 2016-08-26 DIAGNOSIS — N939 Abnormal uterine and vaginal bleeding, unspecified: Secondary | ICD-10-CM | POA: Diagnosis not present

## 2016-08-26 DIAGNOSIS — G8929 Other chronic pain: Secondary | ICD-10-CM

## 2016-08-26 DIAGNOSIS — F17210 Nicotine dependence, cigarettes, uncomplicated: Secondary | ICD-10-CM | POA: Diagnosis not present

## 2016-08-26 DIAGNOSIS — R200 Anesthesia of skin: Secondary | ICD-10-CM | POA: Insufficient documentation

## 2016-08-26 DIAGNOSIS — R1010 Upper abdominal pain, unspecified: Secondary | ICD-10-CM | POA: Insufficient documentation

## 2016-08-26 DIAGNOSIS — R109 Unspecified abdominal pain: Secondary | ICD-10-CM

## 2016-08-26 LAB — CBC
HCT: 37.2 % (ref 36.0–46.0)
Hemoglobin: 12.6 g/dL (ref 12.0–15.0)
MCH: 29.2 pg (ref 26.0–34.0)
MCHC: 33.9 g/dL (ref 30.0–36.0)
MCV: 86.1 fL (ref 78.0–100.0)
PLATELETS: 365 10*3/uL (ref 150–400)
RBC: 4.32 MIL/uL (ref 3.87–5.11)
RDW: 13.6 % (ref 11.5–15.5)
WBC: 14.3 10*3/uL — AB (ref 4.0–10.5)

## 2016-08-26 LAB — COMPREHENSIVE METABOLIC PANEL
ALK PHOS: 68 U/L (ref 38–126)
ALT: 37 U/L (ref 14–54)
AST: 35 U/L (ref 15–41)
Albumin: 4.4 g/dL (ref 3.5–5.0)
Anion gap: 18 — ABNORMAL HIGH (ref 5–15)
BUN: 15 mg/dL (ref 6–20)
CALCIUM: 9.4 mg/dL (ref 8.9–10.3)
CO2: 15 mmol/L — ABNORMAL LOW (ref 22–32)
CREATININE: 0.84 mg/dL (ref 0.44–1.00)
Chloride: 104 mmol/L (ref 101–111)
Glucose, Bld: 162 mg/dL — ABNORMAL HIGH (ref 65–99)
Potassium: 2.9 mmol/L — ABNORMAL LOW (ref 3.5–5.1)
Sodium: 137 mmol/L (ref 135–145)
TOTAL PROTEIN: 7.6 g/dL (ref 6.5–8.1)
Total Bilirubin: 0.6 mg/dL (ref 0.3–1.2)

## 2016-08-26 LAB — I-STAT CHEM 8, ED
BUN: 16 mg/dL (ref 6–20)
CALCIUM ION: 1.09 mmol/L — AB (ref 1.15–1.40)
CREATININE: 0.6 mg/dL (ref 0.44–1.00)
Chloride: 104 mmol/L (ref 101–111)
Glucose, Bld: 125 mg/dL — ABNORMAL HIGH (ref 65–99)
HCT: 38 % (ref 36.0–46.0)
Hemoglobin: 12.9 g/dL (ref 12.0–15.0)
Potassium: 3.7 mmol/L (ref 3.5–5.1)
Sodium: 142 mmol/L (ref 135–145)
TCO2: 26 mmol/L (ref 0–100)

## 2016-08-26 LAB — URINE MICROSCOPIC-ADD ON

## 2016-08-26 LAB — LIPASE, BLOOD: Lipase: 22 U/L (ref 11–51)

## 2016-08-26 LAB — URINALYSIS, ROUTINE W REFLEX MICROSCOPIC
Glucose, UA: NEGATIVE mg/dL
Ketones, ur: >80 mg/dL — AB
Nitrite: NEGATIVE
PROTEIN: 30 mg/dL — AB
Specific Gravity, Urine: 1.031 — ABNORMAL HIGH (ref 1.005–1.030)
pH: 6.5 (ref 5.0–8.0)

## 2016-08-26 LAB — I-STAT BETA HCG BLOOD, ED (MC, WL, AP ONLY)

## 2016-08-26 LAB — I-STAT TROPONIN, ED: TROPONIN I, POC: 0 ng/mL (ref 0.00–0.08)

## 2016-08-26 MED ORDER — POTASSIUM CHLORIDE 10 MEQ/100ML IV SOLN
10.0000 meq | Freq: Once | INTRAVENOUS | Status: AC
  Administered 2016-08-26: 10 meq via INTRAVENOUS
  Filled 2016-08-26: qty 100

## 2016-08-26 MED ORDER — ONDANSETRON 4 MG PO TBDP: 4.0000 mg | ORAL_TABLET | Freq: Three times a day (TID) | ORAL | 0 refills | Status: DC | PRN

## 2016-08-26 MED ORDER — PROMETHAZINE HCL 25 MG RE SUPP: 25.0000 mg | Freq: Four times a day (QID) | RECTAL | 0 refills | Status: DC | PRN

## 2016-08-26 MED ORDER — SODIUM CHLORIDE 0.9 % IV BOLUS (SEPSIS)
1000.0000 mL | Freq: Once | INTRAVENOUS | Status: AC
Start: 2016-08-26 — End: 2016-08-26
  Administered 2016-08-26: 1000 mL via INTRAVENOUS

## 2016-08-26 MED ORDER — FAMOTIDINE IN NACL 20-0.9 MG/50ML-% IV SOLN
20.0000 mg | Freq: Once | INTRAVENOUS | Status: AC
  Administered 2016-08-26: 20 mg via INTRAVENOUS
  Filled 2016-08-26: qty 50

## 2016-08-26 MED ORDER — HALOPERIDOL LACTATE 5 MG/ML IJ SOLN
2.0000 mg | Freq: Once | INTRAMUSCULAR | Status: AC
  Administered 2016-08-26: 2 mg via INTRAVENOUS
  Filled 2016-08-26: qty 1

## 2016-08-26 NOTE — ED Provider Notes (Signed)
[facility] DEPT Provider Note   CSN: [id number] Arrival date & time: 08/26/16  1044     History   Chief Complaint Chief Complaint  Patient presents with  . Abdominal Pain  . Nausea    HPI Jeanne Mcneil is a 30 y.o. female.  Patient is a 30 year old female with history of reflux, kidney stones, anxiety and chronic abdominal pain he presents the ED with complaints of abdominal pain, onset 5 AM. Patient reports when she woke up this morning she started to have severe upper abdominal pain. She notes this feels similar to her chronic abdominal pain but states it is worse and states it does not feel like pain related to her kidneys. She notes the pain is constant and worse with certain movements. Endorses associated nausea, nonbloody vomiting and nonbloody diarrhea. She notes she has been able to keep anything down since onset. She states she tried to take Phenergan at home but vomited immediately after. She also reports having associated midsternal chest pain that is present with episodes of vomiting. Denies fever, chills, headache, lightheadedness, dizziness, cough, shortness of breath, wheezing, palpitations, hematemesis, urinary symptoms, flank pain, vaginal discharge. She notes she started her menstrual cycle last week and is still having light spotting. Patient also reports having intermittent numbness to her left hand.  Endorses abdominal surgical history of ureter stent.       Past Medical History:  Diagnosis Date  . Anxiety   . Back pain   . Constipation   . Depression   . GERD (gastroesophageal reflux disease)   . Glaucoma of both eyes   . Hematuria   . History of abnormal cervical Pap smear   . History of chronic gastritis   . History of kidney stones   . Hydronephrosis, right   . Renal calculi    bilateral per ct 0902-2017  . Right ureteral stone   . Urgency of urination   . Uterine fibroid     Patient Active Problem List   Diagnosis Date Noted  . Nausea  with vomiting 07/26/2016  . Ureteral stone 07/04/2016  . UTI (urinary tract infection) 07/04/2016  . Hydronephrosis of right kidney 07/04/2016  . GERD (gastroesophageal reflux disease) 07/04/2016  . Ureteral calculi 07/04/2016  . Abdominal pain, chronic, epigastric   . Hematemesis 05/16/2016  . Heme + stool 05/16/2016  . Abdominal pain, epigastric 05/16/2016    Past Surgical History:  Procedure Laterality Date  . CYSTOSCOPY W/ URETERAL STENT PLACEMENT Right 07/04/2016   Procedure: CYSTOSCOPY WITH RIGHT RETROGRADE PYELOGRAM, RIGHT URETERAL STENT PLACEMENT;  Surgeon: Ardis Hughs, MD;  Location: [facility];  Service: Urology;  Laterality: Right;  . CYSTOSCOPY WITH RETROGRADE PYELOGRAM, URETEROSCOPY AND STENT PLACEMENT Left 11/28/2007  . CYSTOSCOPY WITH RETROGRADE PYELOGRAM, URETEROSCOPY AND STENT PLACEMENT Right 08/02/2016   Procedure: CYSTOSCOPY WITH RIGHT  RETROGRADE PYELOGRAM, URETEROSCOPY,  AND STENT EXCHANGE;  Surgeon: Ardis Hughs, MD;  Location: [facility];  Service: Urology;  Laterality: Right;  . ESOPHAGOGASTRODUODENOSCOPY N/A 05/17/2016   Procedure: ESOPHAGOGASTRODUODENOSCOPY (EGD);  Surgeon: Danie Binder, MD;  Location: [facility];  Service: Endoscopy;  Laterality: N/A;  71     OB History    Gravida Para Term Preterm AB Living   3 2 2   1 2    SAB TAB Ectopic Multiple Live Births   1       2       Home Medications    Prior to Admission medications   Medication  Sig Start Date End Date Taking? Authorizing Provider  acetaminophen (TYLENOL) 500 MG tablet Take 1,000 mg by mouth every 6 (six) hours as needed.    Historical Provider, MD  Bisacodyl (LAXATIVE PO) Take by mouth as needed.    Historical Provider, MD  busPIRone (BUSPAR) 5 MG tablet Take 5 mg by mouth 2 (two) times daily.    Historical Provider, MD  dexlansoprazole (DEXILANT) 60 MG capsule Take 1 capsule (60 mg total) by mouth daily. Patient taking differently: Take 60 mg by mouth every  morning.  07/26/16   Carlis Stable, NP  dicyclomine (BENTYL) 20 MG tablet Take 1 tablet (20 mg total) by mouth every 6 (six) hours as needed for spasms (abdominal cramping). 07/22/16   Francine Graven, DO  diphenhydramine-acetaminophen (TYLENOL PM) 25-500 MG TABS tablet Take 1 tablet by mouth at bedtime as needed.    Historical Provider, MD  docusate sodium (COLACE) 100 MG capsule Take 100 mg by mouth as needed for mild constipation.    Historical Provider, MD  lidocaine (XYLOCAINE) 2 % solution Use as directed 15 mLs in the mouth or throat every 6 (six) hours as needed for mouth pain. 07/26/16   Carlis Stable, NP  ondansetron (ZOFRAN ODT) 4 MG disintegrating tablet Take 1 tablet (4 mg total) by mouth every 8 (eight) hours as needed for nausea or vomiting. 08/26/16   Nona Dell, PA-C  oxybutynin (DITROPAN) 5 MG tablet Take 1 tablet (5 mg total) by mouth 3 (three) times daily. 07/16/16   Olivia Canter Sam, PA-C  phenazopyridine (PYRIDIUM) 200 MG tablet Take 1 tablet (200 mg total) by mouth 3 (three) times daily as needed for pain. 08/02/16   Ardis Hughs, MD  promethazine (PHENERGAN) 25 MG suppository Place 1 suppository (25 mg total) rectally every 6 (six) hours as needed for nausea or vomiting. 08/26/16   Nona Dell, PA-C  sertraline (ZOLOFT) 50 MG tablet Take 50 mg by mouth every morning.     Historical Provider, MD  traMADol (ULTRAM) 50 MG tablet Take 1-2 tablets (50-100 mg total) by mouth every 6 (six) hours as needed for moderate pain. 08/02/16   Ardis Hughs, MD    Family History Family History  Problem Relation Age of Onset  . Colon cancer Paternal Uncle     Passed away 01/30/2015    Social History Social History  Substance Use Topics  . Smoking status: Current Every Day Smoker    Packs/day: 0.50    Years: 20.00    Types: Cigarettes  . Smokeless tobacco: Never Used  . Alcohol use 2.4 oz/week    4 Shots of liquor per week     Comment: No ETOH in 4 months; Previously:  occasionally, once every 2-3 months.     Allergies   Keflex [cephalexin]; Codeine; Percocet [oxycodone-acetaminophen]; Tape; and Vicodin [hydrocodone-acetaminophen]   Review of Systems Review of Systems  Gastrointestinal: Positive for abdominal pain, diarrhea, nausea and vomiting.  Genitourinary: Positive for vaginal bleeding (on menstural cycle).  Neurological: Positive for numbness (intermittent left hand).  All other systems reviewed and are negative.    Physical Exam Updated Vital Signs BP 184/99 (BP Location: Right Arm)   Pulse 62   Temp 97.4 F (36.3 C) (Oral)   Resp 16   SpO2 99%   Physical Exam  Constitutional: She is oriented to person, place, and time. She appears well-developed and well-nourished.  Patient is tearful and hyperventilating intermittently throughout exam and moaning intermittently. She is  rolling around in the bed yelling out about her pain.   HENT:  Head: Normocephalic and atraumatic.  Mouth/Throat: Oropharynx is clear and moist. No oropharyngeal exudate.  Eyes: Conjunctivae and EOM are normal. Right eye exhibits no discharge. Left eye exhibits no discharge. No scleral icterus.  Neck: Normal range of motion. Neck supple.  Cardiovascular: Normal rate, regular rhythm, normal heart sounds and intact distal pulses.   Pulmonary/Chest: Effort normal and breath sounds normal. No respiratory distress. She has no wheezes. She has no rales. She exhibits no tenderness.  Abdominal: Soft. Bowel sounds are normal. She exhibits no distension and no mass. There is tenderness (epigastric and RUQ TTP). There is no rebound and no guarding. No hernia.  No CVA tenderness  Musculoskeletal: Normal range of motion. She exhibits no edema.  Full range of motion of neck and back. Full range of motion of bilateral upper and lower extremities, with 5/5 strength. Sensation intact. 2+ radial and PT pulses. Cap refill <2 seconds.   Neurological: She is alert and oriented to person,  place, and time. She has normal strength. No sensory deficit.  Skin: Skin is warm and dry. Capillary refill takes less than 2 seconds.  Nursing note and vitals reviewed.    ED Treatments / Results  Labs (all labs ordered are listed, but only abnormal results are displayed) Labs Reviewed  COMPREHENSIVE METABOLIC PANEL - Abnormal; Notable for the following:       Result Value   Potassium 2.9 (*)    CO2 15 (*)    Glucose, Bld 162 (*)    Anion gap 18 (*)    All other components within normal limits  CBC - Abnormal; Notable for the following:    WBC 14.3 (*)    All other components within normal limits  URINALYSIS, ROUTINE W REFLEX MICROSCOPIC (NOT AT [facility]) - Abnormal; Notable for the following:    Color, Urine AMBER (*)    APPearance HAZY (*)    Specific Gravity, Urine 1.031 (*)    Hgb urine dipstick LARGE (*)    Bilirubin Urine SMALL (*)    Ketones, ur >80 (*)    Protein, ur 30 (*)    Leukocytes, UA TRACE (*)    All other components within normal limits  URINE MICROSCOPIC-ADD ON - Abnormal; Notable for the following:    Squamous Epithelial / LPF 0-5 (*)    Bacteria, UA RARE (*)    All other components within normal limits  I-STAT CHEM 8, ED - Abnormal; Notable for the following:    Glucose, Bld 125 (*)    Calcium, Ion 1.09 (*)    All other components within normal limits  LIPASE, BLOOD  I-STAT BETA HCG BLOOD, ED ([facility], [facility], [facility] ONLY)  I-STAT TROPOININ, ED    EKG  EKG Interpretation None       Radiology No results found.  Procedures Procedures (including critical care time)  Medications Ordered in ED Medications  sodium chloride 0.9 % bolus 1,000 mL (0 mLs Intravenous Stopped 08/26/16 1419)  haloperidol lactate (HALDOL) injection 2 mg (2 mg Intravenous Given 08/26/16 1156)  potassium chloride 10 mEq in 100 mL IVPB (0 mEq Intravenous Stopped 08/26/16 1337)  famotidine (PEPCID) IVPB 20 mg premix (0 mg Intravenous Stopped 08/26/16 1336)     Initial Impression /  Assessment and Plan / ED Course  I have reviewed the triage vital signs and the nursing notes.  Pertinent labs & imaging results that were available during my care of  the patient were reviewed by me and considered in my medical decision making (see chart for details).  Clinical Course    Patient presents with upper abdominal pain with associated nausea, vomiting and diarrhea and states her pain is consistent with her chronic abdominal pain. Patient also endorses having anxiety and reports having intermittent numbness to her right hand since onset of her abdominal pain. VSS. On initial evaluation patient appears mildly anxious and is intermittently hyperventilating, crying and yelling about her pain. Exam revealed tenderness over epigastric and right upper quadrant, no peritoneal signs. Remaining exam unremarkable. No neuro deficits. Patient given IV fluids and Haldol.  Initial labs showed potassium 2.9, CO2 15, anion gap 18, WBC 14.3. Suspect patient's and gap is likely related to episode of hyperventilation related to her anxiety. S/p 1L IVF, AG improved to 15. Pregnancy negative. Patient given IV potassium supplementation in the ED, repeat labs revealed potassium improved to 3.7. On reevaluation patient reports her pain and nausea have improved, denies any episodes of vomiting. Patient tolerating by mouth. Suspect patient's symptoms are likely related to her chronic abdominal pain and do not feel any further workup or imaging is warranted at this time. Discussed results and plan for discharge with patient. Plan to discharge patient home with by mouth and suppository antiemetics. Patient reports she has a follow-up appointment with her gastroenterologist on 10/12. Advised patient to follow up at her scheduled appointment. Discussed return precautions.  Final Clinical Impressions(s) / ED Diagnoses   Final diagnoses:  Chronic abdominal pain  Nausea and vomiting, intractability of vomiting not  specified, unspecified vomiting type    New Prescriptions Discharge Medication List as of 08/26/2016  4:11 PM    START taking these medications   Details  ondansetron (ZOFRAN ODT) 4 MG disintegrating tablet Take 1 tablet (4 mg total) by mouth every 8 (eight) hours as needed for nausea or vomiting., Starting Sat 08/26/2016, Print    promethazine (PHENERGAN) 25 MG suppository Place 1 suppository (25 mg total) rectally every 6 (six) hours as needed for nausea or vomiting., Starting Sat 08/26/2016, Print         Chesley Noon [place], [place] 08/26/16 [place], MD 08/28/16 [phone]

## 2016-08-26 NOTE — ED Triage Notes (Addendum)
Pt brought by EMS, c/o intermittent epigastric pain and RLQ pain tender to palpation, emesis onset today 0530. Pt observed placing fingers in throat to self-induce emesis. Pt adds intermittent chest pain onset today at 0500, correlates with emesis, new onset now of left hand numbness. Pt is hyperventilating and highly anxious. Hx GI issues and recent renal stent placement, reports symptoms are unlike anything she has felt before. EMS administered 4 mg Zofran IV en route. Pt attempted to take her prescribed Phenergan this morning but vomited immediately after swallowing.

## 2016-08-26 NOTE — Discharge Instructions (Signed)
Take your medication as prescribed as needed for nausea. Continue drinking fluids at home to remain hydrated. Follow-up with your gastroenterologist at your scheduled appointment next week for further management of your chronic abdominal pain. Please return to the Emergency Department if symptoms worsen or new onset of fever, new/worsening abdominal pain, vomiting, blood in vomit or stool, unable to keep fluids down, chest pain, difficulty breathing.

## 2016-08-26 NOTE — ED Notes (Signed)
Bed: [facility] Expected date:  Expected time:  Means of arrival:  Comments: EMS-N/V

## 2016-08-31 ENCOUNTER — Encounter: Payer: Self-pay | Admitting: Gastroenterology

## 2016-08-31 ENCOUNTER — Telehealth: Payer: Self-pay | Admitting: Gastroenterology

## 2016-08-31 ENCOUNTER — Ambulatory Visit: Payer: Self-pay | Admitting: Gastroenterology

## 2016-08-31 NOTE — Telephone Encounter (Signed)
PT WAS A NO SHOW AND LETTER SENT

## 2016-08-31 NOTE — Progress Notes (Deleted)
   Subjective:    Patient ID: Jeanne Mcneil, female    DOB: Jan 15, 1986, 30 y.o.   MRN: [id number]  HPI    Review of Systems     Objective:   Physical Exam        Assessment & Plan:

## 2016-08-31 NOTE — Telephone Encounter (Signed)
REVIEWED. PT WAS A NO SHOW TODAY. RECORDS REVIEWED JAN 2017 TO PRESENT.

## 2016-10-10 ENCOUNTER — Emergency Department (HOSPITAL_COMMUNITY): Payer: Medicaid Other

## 2016-10-10 ENCOUNTER — Encounter (HOSPITAL_COMMUNITY): Payer: Self-pay | Admitting: *Deleted

## 2016-10-10 ENCOUNTER — Emergency Department (HOSPITAL_COMMUNITY)
Admission: EM | Admit: 2016-10-10 | Discharge: 2016-10-10 | Disposition: A | Discharge status: Home / Self Care | Payer: Medicaid Other | Attending: Emergency Medicine | Admitting: Emergency Medicine

## 2016-10-10 DIAGNOSIS — R109 Unspecified abdominal pain: Secondary | ICD-10-CM | POA: Insufficient documentation

## 2016-10-10 DIAGNOSIS — Z87448 Personal history of other diseases of urinary system: Secondary | ICD-10-CM

## 2016-10-10 DIAGNOSIS — Z79899 Other long term (current) drug therapy: Secondary | ICD-10-CM | POA: Insufficient documentation

## 2016-10-10 DIAGNOSIS — Z87442 Personal history of urinary calculi: Secondary | ICD-10-CM | POA: Insufficient documentation

## 2016-10-10 DIAGNOSIS — F17210 Nicotine dependence, cigarettes, uncomplicated: Secondary | ICD-10-CM | POA: Insufficient documentation

## 2016-10-10 DIAGNOSIS — R935 Abnormal findings on diagnostic imaging of other abdominal regions, including retroperitoneum: Secondary | ICD-10-CM | POA: Insufficient documentation

## 2016-10-10 LAB — COMPREHENSIVE METABOLIC PANEL
ALT: 43 U/L (ref 14–54)
ANION GAP: 17 — AB (ref 5–15)
AST: 40 U/L (ref 15–41)
Albumin: 4.8 g/dL (ref 3.5–5.0)
Alkaline Phosphatase: 65 U/L (ref 38–126)
BUN: 15 mg/dL (ref 6–20)
CHLORIDE: 103 mmol/L (ref 101–111)
CO2: 19 mmol/L — AB (ref 22–32)
Calcium: 9.7 mg/dL (ref 8.9–10.3)
Creatinine, Ser: 0.98 mg/dL (ref 0.44–1.00)
Glucose, Bld: 198 mg/dL — ABNORMAL HIGH (ref 65–99)
POTASSIUM: 3.3 mmol/L — AB (ref 3.5–5.1)
SODIUM: 139 mmol/L (ref 135–145)
Total Bilirubin: 0.9 mg/dL (ref 0.3–1.2)
Total Protein: 7.8 g/dL (ref 6.5–8.1)

## 2016-10-10 LAB — URINE MICROSCOPIC-ADD ON

## 2016-10-10 LAB — URINALYSIS, ROUTINE W REFLEX MICROSCOPIC
Glucose, UA: NEGATIVE mg/dL
KETONES UR: 15 mg/dL — AB
NITRITE: NEGATIVE
PH: 6.5 (ref 5.0–8.0)
Protein, ur: 100 mg/dL — AB
SPECIFIC GRAVITY, URINE: 1.035 — AB (ref 1.005–1.030)

## 2016-10-10 LAB — CBC WITH DIFFERENTIAL/PLATELET
BASOS ABS: 0 10*3/uL (ref 0.0–0.1)
Basophils Relative: 0 %
EOS ABS: 0 10*3/uL (ref 0.0–0.7)
Eosinophils Relative: 0 %
HEMATOCRIT: 38.1 % (ref 36.0–46.0)
Hemoglobin: 13 g/dL (ref 12.0–15.0)
LYMPHS ABS: 3.1 10*3/uL (ref 0.7–4.0)
LYMPHS PCT: 11 %
MCH: 29.4 pg (ref 26.0–34.0)
MCHC: 34.1 g/dL (ref 30.0–36.0)
MCV: 86.2 fL (ref 78.0–100.0)
MONOS PCT: 5 %
Monocytes Absolute: 1.4 10*3/uL — ABNORMAL HIGH (ref 0.1–1.0)
NEUTROS ABS: 23.5 10*3/uL — AB (ref 1.7–7.7)
Neutrophils Relative %: 84 %
Platelets: 402 10*3/uL — ABNORMAL HIGH (ref 150–400)
RBC: 4.42 MIL/uL (ref 3.87–5.11)
RDW: 14.5 % (ref 11.5–15.5)
WBC: 28 10*3/uL — AB (ref 4.0–10.5)

## 2016-10-10 LAB — RAPID URINE DRUG SCREEN, HOSP PERFORMED
Amphetamines: NOT DETECTED
BARBITURATES: NOT DETECTED
Benzodiazepines: NOT DETECTED
COCAINE: NOT DETECTED
Opiates: NOT DETECTED
TETRAHYDROCANNABINOL: POSITIVE — AB

## 2016-10-10 LAB — I-STAT BETA HCG BLOOD, ED (MC, WL, AP ONLY)

## 2016-10-10 LAB — LIPASE, BLOOD: LIPASE: 15 U/L (ref 11–51)

## 2016-10-10 MED ORDER — LORAZEPAM 2 MG/ML IJ SOLN
1.0000 mg | Freq: Once | INTRAMUSCULAR | Status: AC
  Administered 2016-10-10: 1 mg via INTRAVENOUS
  Filled 2016-10-10: qty 1

## 2016-10-10 MED ORDER — SODIUM CHLORIDE 0.9 % IV BOLUS (SEPSIS): 1000.0000 mL | Freq: Once | INTRAVENOUS | Status: DC

## 2016-10-10 MED ORDER — HYDROMORPHONE HCL 1 MG/ML IJ SOLN
1.0000 mg | Freq: Once | INTRAMUSCULAR | Status: AC
  Administered 2016-10-10: 1 mg via INTRAVENOUS
  Filled 2016-10-10: qty 1

## 2016-10-10 MED ORDER — OXYCODONE-ACETAMINOPHEN 5-325 MG PO TABS: 2.0000 | ORAL_TABLET | ORAL | 0 refills | Status: DC | PRN

## 2016-10-10 MED ORDER — SODIUM CHLORIDE 0.9 % IV BOLUS (SEPSIS)
1000.0000 mL | Freq: Once | INTRAVENOUS | Status: AC
  Administered 2016-10-10: 1000 mL via INTRAVENOUS

## 2016-10-10 MED ORDER — PROMETHAZINE HCL 25 MG PO TABS: 25.0000 mg | ORAL_TABLET | Freq: Four times a day (QID) | ORAL | 0 refills | Status: DC | PRN

## 2016-10-10 MED ORDER — SODIUM CHLORIDE 0.9 % IV SOLN: INTRAVENOUS | Status: DC

## 2016-10-10 MED ORDER — PROMETHAZINE HCL 25 MG/ML IJ SOLN
25.0000 mg | Freq: Once | INTRAMUSCULAR | Status: AC
  Administered 2016-10-10: 25 mg via INTRAVENOUS
  Filled 2016-10-10: qty 1

## 2016-10-10 NOTE — Discharge Instructions (Signed)
Return here if you develop fever or worsening symptoms

## 2016-10-10 NOTE — ED Provider Notes (Addendum)
[facility] DEPT Provider Note   CSN: [id number] Arrival date & time: 10/10/16  2440     History   Chief Complaint Chief Complaint  Patient presents with  . Abdominal Pain  . Flank Pain    HPI Jeanne Mcneil is a 30 y.o. female.  30 year old female with history of renal colic as well as chronic abdominal pain presents with 2 day history of left-sided flank pain similar to her prior kidney stones. States that she passed a kidney stone yesterday. She continues to endorse colicky pain without hematuria or dysuria. Has had emesis which is been nonbilious or bloody. Denies any vaginal bleeding or discharge. Has used her home medications without relief.      Past Medical History:  Diagnosis Date  . Anxiety   . Back pain   . Constipation   . Depression   . GERD (gastroesophageal reflux disease)   . Glaucoma of both eyes   . Hematuria   . History of abnormal cervical Pap smear   . History of chronic gastritis   . History of kidney stones   . Hydronephrosis, right   . Renal calculi    bilateral per ct 0902-2017  . Right ureteral stone   . Urgency of urination   . Uterine fibroid     Patient Active Problem List   Diagnosis Date Noted  . Nausea with vomiting 07/26/2016  . Ureteral stone 07/04/2016  . UTI (urinary tract infection) 07/04/2016  . Hydronephrosis of right kidney 07/04/2016  . GERD (gastroesophageal reflux disease) 07/04/2016  . Ureteral calculi 07/04/2016  . Abdominal pain, chronic, epigastric   . Hematemesis 05/16/2016  . Heme + stool 05/16/2016  . Abdominal pain, epigastric 05/16/2016    Past Surgical History:  Procedure Laterality Date  . CYSTOSCOPY W/ URETERAL STENT PLACEMENT Right 07/04/2016   Procedure: CYSTOSCOPY WITH RIGHT RETROGRADE PYELOGRAM, RIGHT URETERAL STENT PLACEMENT;  Surgeon: Ardis Hughs, MD;  Location: [facility];  Service: Urology;  Laterality: Right;  . CYSTOSCOPY WITH RETROGRADE PYELOGRAM, URETEROSCOPY AND STENT PLACEMENT  Left 11/28/2007  . CYSTOSCOPY WITH RETROGRADE PYELOGRAM, URETEROSCOPY AND STENT PLACEMENT Right 08/02/2016   Procedure: CYSTOSCOPY WITH RIGHT  RETROGRADE PYELOGRAM, URETEROSCOPY,  AND STENT EXCHANGE;  Surgeon: Ardis Hughs, MD;  Location: [facility];  Service: Urology;  Laterality: Right;  . ESOPHAGOGASTRODUODENOSCOPY N/A 05/17/2016   Procedure: ESOPHAGOGASTRODUODENOSCOPY (EGD);  Surgeon: Danie Binder, MD;  Location: [facility];  Service: Endoscopy;  Laterality: N/A;  76     OB History    Gravida Para Term Preterm AB Living   3 2 2   1 2    SAB TAB Ectopic Multiple Live Births   1       2       Home Medications    Prior to Admission medications   Medication Sig Start Date End Date Taking? Authorizing Provider  acetaminophen (TYLENOL) 500 MG tablet Take 1,000 mg by mouth every 6 (six) hours as needed.    Historical Provider, MD  Bisacodyl (LAXATIVE PO) Take by mouth as needed.    Historical Provider, MD  busPIRone (BUSPAR) 5 MG tablet Take 5 mg by mouth 2 (two) times daily.    Historical Provider, MD  dexlansoprazole (DEXILANT) 60 MG capsule Take 1 capsule (60 mg total) by mouth daily. Patient taking differently: Take 60 mg by mouth every morning.  07/26/16   Carlis Stable, NP  dicyclomine (BENTYL) 20 MG tablet Take 1 tablet (20 mg total) by mouth  every 6 (six) hours as needed for spasms (abdominal cramping). 07/22/16   Francine Graven, DO  diphenhydramine-acetaminophen (TYLENOL PM) 25-500 MG TABS tablet Take 1 tablet by mouth at bedtime as needed.    Historical Provider, MD  docusate sodium (COLACE) 100 MG capsule Take 100 mg by mouth as needed for mild constipation.    Historical Provider, MD  lidocaine (XYLOCAINE) 2 % solution Use as directed 15 mLs in the mouth or throat every 6 (six) hours as needed for mouth pain. 07/26/16   Carlis Stable, NP  ondansetron (ZOFRAN ODT) 4 MG disintegrating tablet Take 1 tablet (4 mg total) by mouth every 8 (eight) hours as needed for  nausea or vomiting. 08/26/16   Nona Dell, PA-C  oxybutynin (DITROPAN) 5 MG tablet Take 1 tablet (5 mg total) by mouth 3 (three) times daily. 07/16/16   Olivia Canter Sam, PA-C  phenazopyridine (PYRIDIUM) 200 MG tablet Take 1 tablet (200 mg total) by mouth 3 (three) times daily as needed for pain. 08/02/16   Ardis Hughs, MD  promethazine (PHENERGAN) 25 MG suppository Place 1 suppository (25 mg total) rectally every 6 (six) hours as needed for nausea or vomiting. 08/26/16   Nona Dell, PA-C  sertraline (ZOLOFT) 50 MG tablet Take 50 mg by mouth every morning.     Historical Provider, MD  traMADol (ULTRAM) 50 MG tablet Take 1-2 tablets (50-100 mg total) by mouth every 6 (six) hours as needed for moderate pain. 08/02/16   Ardis Hughs, MD    Family History Family History  Problem Relation Age of Onset  . Colon cancer Paternal Uncle     Passed away 2015-01-31    Social History Social History  Substance Use Topics  . Smoking status: Current Every Day Smoker    Packs/day: 0.50    Years: 20.00    Types: Cigarettes  . Smokeless tobacco: Never Used  . Alcohol use 2.4 oz/week    4 Shots of liquor per week     Comment: No ETOH in 4 months; Previously: occasionally, once every 2-3 months.     Allergies   Keflex [cephalexin]; Codeine; Percocet [oxycodone-acetaminophen]; Tape; and Vicodin [hydrocodone-acetaminophen]   Review of Systems Review of Systems  All other systems reviewed and are negative.    Physical Exam Updated Vital Signs BP (!) 133/110 (BP Location: Right Arm)   Pulse 104   Temp 97.5 F (36.4 C) (Oral)   Resp 17   SpO2 98%   Physical Exam  Constitutional: She is oriented to person, place, and time. She appears well-developed and well-nourished.  Non-toxic appearance. No distress.  HENT:  Head: Normocephalic and atraumatic.  Eyes: Conjunctivae, EOM and lids are normal. Pupils are equal, round, and reactive to light.  Neck: Normal range of  motion. Neck supple. No tracheal deviation present. No thyroid mass present.  Cardiovascular: Normal rate, regular rhythm and normal heart sounds.  Exam reveals no gallop.   No murmur heard. Pulmonary/Chest: Effort normal and breath sounds normal. No stridor. No respiratory distress. She has no decreased breath sounds. She has no wheezes. She has no rhonchi. She has no rales.  Abdominal: Soft. Normal appearance and bowel sounds are normal. She exhibits no distension. There is no tenderness. There is CVA tenderness. There is no rigidity, no rebound and no guarding.  Musculoskeletal: Normal range of motion. She exhibits no edema or tenderness.  Neurological: She is alert and oriented to person, place, and time. She has normal strength. No  cranial nerve deficit or sensory deficit. GCS eye subscore is 4. GCS verbal subscore is 5. GCS motor subscore is 6.  Skin: Skin is warm and dry. No abrasion and no rash noted.  Psychiatric: Her speech is normal and behavior is normal. Her mood appears anxious.  Nursing note and vitals reviewed.    ED Treatments / Results  Labs (all labs ordered are listed, but only abnormal results are displayed) Labs Reviewed - No data to display  EKG  EKG Interpretation None       Radiology No results found.  Procedures Procedures (including critical care time)  Medications Ordered in ED Medications - No data to display   Initial Impression / Assessment and Plan / ED Course  I have reviewed the triage vital signs and the nursing notes.  Pertinent labs & imaging results that were available during my care of the patient were reviewed by me and considered in my medical decision making (see chart for details).  Clinical Course    Patient given IV fluids and pain medication feels better. Her ultrasound showed no signs of hydronephrosis. States that she did pass a kidney stone a day ago and likely this is the cause of her hematuria. She has no signs of infection  and urine. Does have a moderate leukocytosis on CBC but she denies any fever or abdominal discomfort. Have instructed the patient to follow-up with her urologist and return here should she develop a fever or worsening symptoms. .  Final Clinical Impressions(s) / ED Diagnoses   Final diagnoses:  None    New Prescriptions New Prescriptions   No medications on file     Lacretia Leigh, MD 10/10/16 [place], MD 10/10/16 1159

## 2016-10-10 NOTE — ED Notes (Signed)
Bed: [facility] Expected date:  Expected time:  Means of arrival:  Comments: EMS-abdominal pain

## 2016-10-10 NOTE — ED Triage Notes (Signed)
Per EMS - patient c/o abdominal pain and left flank pain that is worse today.  Patient also c/o nausea and vomiting.  Patient has hx of renal calculi and abdominal pain with N/V.  Patient has not treated symptoms at home.  She is out of her Phenergan prescription.  Patient received 8 mg Zofran IVP en route with EMS.  She states she still has nausea, but she stopped dry heaving with EMS.  Patient also had 150 mcg of Fentanyl with EMS. After medications were administered, patient was observed putting her fingers down her throat.  Patient was also observed doing that upon arrival to ED exam room.  Patient denies that Fentanyl addressed her pain.  Vitals with EMS 116/96, HR 78, RR 16, 98% on RA.

## 2016-10-11 ENCOUNTER — Emergency Department (HOSPITAL_COMMUNITY): Payer: Self-pay

## 2016-10-11 ENCOUNTER — Encounter (HOSPITAL_COMMUNITY): Payer: Self-pay

## 2016-10-11 ENCOUNTER — Observation Stay (HOSPITAL_COMMUNITY)
Admission: EM | Admit: 2016-10-11 | Discharge: 2016-10-12 | Disposition: A | Discharge status: Home / Self Care | Payer: Self-pay | Attending: Internal Medicine | Admitting: Internal Medicine

## 2016-10-11 DIAGNOSIS — E876 Hypokalemia: Secondary | ICD-10-CM | POA: Diagnosis present

## 2016-10-11 DIAGNOSIS — Z885 Allergy status to narcotic agent status: Secondary | ICD-10-CM | POA: Insufficient documentation

## 2016-10-11 DIAGNOSIS — K920 Hematemesis: Secondary | ICD-10-CM | POA: Insufficient documentation

## 2016-10-11 DIAGNOSIS — N200 Calculus of kidney: Secondary | ICD-10-CM

## 2016-10-11 DIAGNOSIS — Z8249 Family history of ischemic heart disease and other diseases of the circulatory system: Secondary | ICD-10-CM | POA: Insufficient documentation

## 2016-10-11 DIAGNOSIS — K439 Ventral hernia without obstruction or gangrene: Secondary | ICD-10-CM | POA: Insufficient documentation

## 2016-10-11 DIAGNOSIS — K219 Gastro-esophageal reflux disease without esophagitis: Secondary | ICD-10-CM | POA: Insufficient documentation

## 2016-10-11 DIAGNOSIS — K449 Diaphragmatic hernia without obstruction or gangrene: Secondary | ICD-10-CM | POA: Insufficient documentation

## 2016-10-11 DIAGNOSIS — F17210 Nicotine dependence, cigarettes, uncomplicated: Secondary | ICD-10-CM | POA: Insufficient documentation

## 2016-10-11 DIAGNOSIS — H409 Unspecified glaucoma: Secondary | ICD-10-CM | POA: Insufficient documentation

## 2016-10-11 DIAGNOSIS — Z841 Family history of disorders of kidney and ureter: Secondary | ICD-10-CM | POA: Insufficient documentation

## 2016-10-11 DIAGNOSIS — K5730 Diverticulosis of large intestine without perforation or abscess without bleeding: Secondary | ICD-10-CM | POA: Insufficient documentation

## 2016-10-11 DIAGNOSIS — Z91048 Other nonmedicinal substance allergy status: Secondary | ICD-10-CM | POA: Insufficient documentation

## 2016-10-11 DIAGNOSIS — R739 Hyperglycemia, unspecified: Secondary | ICD-10-CM | POA: Diagnosis present

## 2016-10-11 DIAGNOSIS — F329 Major depressive disorder, single episode, unspecified: Secondary | ICD-10-CM | POA: Insufficient documentation

## 2016-10-11 DIAGNOSIS — N12 Tubulo-interstitial nephritis, not specified as acute or chronic: Principal | ICD-10-CM | POA: Diagnosis present

## 2016-10-11 DIAGNOSIS — R030 Elevated blood-pressure reading, without diagnosis of hypertension: Secondary | ICD-10-CM | POA: Diagnosis present

## 2016-10-11 DIAGNOSIS — R109 Unspecified abdominal pain: Secondary | ICD-10-CM

## 2016-10-11 DIAGNOSIS — Z800 Family history of malignant neoplasm of digestive organs: Secondary | ICD-10-CM | POA: Insufficient documentation

## 2016-10-11 DIAGNOSIS — Z87442 Personal history of urinary calculi: Secondary | ICD-10-CM | POA: Insufficient documentation

## 2016-10-11 DIAGNOSIS — N23 Unspecified renal colic: Secondary | ICD-10-CM | POA: Diagnosis present

## 2016-10-11 DIAGNOSIS — G8929 Other chronic pain: Secondary | ICD-10-CM | POA: Diagnosis present

## 2016-10-11 DIAGNOSIS — I10 Essential (primary) hypertension: Secondary | ICD-10-CM | POA: Insufficient documentation

## 2016-10-11 DIAGNOSIS — Z881 Allergy status to other antibiotic agents status: Secondary | ICD-10-CM | POA: Insufficient documentation

## 2016-10-11 DIAGNOSIS — Z79899 Other long term (current) drug therapy: Secondary | ICD-10-CM | POA: Insufficient documentation

## 2016-10-11 DIAGNOSIS — R1013 Epigastric pain: Secondary | ICD-10-CM

## 2016-10-11 DIAGNOSIS — F411 Generalized anxiety disorder: Secondary | ICD-10-CM | POA: Diagnosis present

## 2016-10-11 LAB — URINALYSIS, ROUTINE W REFLEX MICROSCOPIC
Glucose, UA: NEGATIVE mg/dL
Ketones, ur: >80 mg/dL — AB
NITRITE: NEGATIVE
PROTEIN: 100 mg/dL — AB
Specific Gravity, Urine: 1.036 — ABNORMAL HIGH (ref 1.005–1.030)
pH: 7.5 (ref 5.0–8.0)

## 2016-10-11 LAB — COMPREHENSIVE METABOLIC PANEL
ALK PHOS: 67 U/L (ref 38–126)
ALT: 43 U/L (ref 14–54)
ANION GAP: 14 (ref 5–15)
AST: 41 U/L (ref 15–41)
Albumin: 4.8 g/dL (ref 3.5–5.0)
BILIRUBIN TOTAL: 1.2 mg/dL (ref 0.3–1.2)
BUN: 15 mg/dL (ref 6–20)
CALCIUM: 9.4 mg/dL (ref 8.9–10.3)
CO2: 20 mmol/L — AB (ref 22–32)
CREATININE: 0.88 mg/dL (ref 0.44–1.00)
Chloride: 101 mmol/L (ref 101–111)
Glucose, Bld: 143 mg/dL — ABNORMAL HIGH (ref 65–99)
Potassium: 3.1 mmol/L — ABNORMAL LOW (ref 3.5–5.1)
Sodium: 135 mmol/L (ref 135–145)
TOTAL PROTEIN: 7.7 g/dL (ref 6.5–8.1)

## 2016-10-11 LAB — CBC
HCT: 36.8 % (ref 36.0–46.0)
HEMOGLOBIN: 12.2 g/dL (ref 12.0–15.0)
MCH: 29 pg (ref 26.0–34.0)
MCHC: 33.2 g/dL (ref 30.0–36.0)
MCV: 87.4 fL (ref 78.0–100.0)
PLATELETS: 386 10*3/uL (ref 150–400)
RBC: 4.21 MIL/uL (ref 3.87–5.11)
RDW: 14.8 % (ref 11.5–15.5)
WBC: 18.4 10*3/uL — AB (ref 4.0–10.5)

## 2016-10-11 LAB — LIPASE, BLOOD: Lipase: 18 U/L (ref 11–51)

## 2016-10-11 LAB — URINE MICROSCOPIC-ADD ON

## 2016-10-11 LAB — URINE CULTURE

## 2016-10-11 LAB — I-STAT BETA HCG BLOOD, ED (MC, WL, AP ONLY)

## 2016-10-11 MED ORDER — ONDANSETRON HCL 4 MG PO TABS
4.0000 mg | ORAL_TABLET | Freq: Four times a day (QID) | ORAL | Status: DC | PRN
  Administered 2016-10-12: 4 mg via ORAL
  Filled 2016-10-11: qty 1

## 2016-10-11 MED ORDER — OXYCODONE-ACETAMINOPHEN 5-325 MG PO TABS
2.0000 | ORAL_TABLET | ORAL | Status: DC | PRN
  Administered 2016-10-11 – 2016-10-12 (×2): 2 via ORAL
  Filled 2016-10-11 (×2): qty 2

## 2016-10-11 MED ORDER — SERTRALINE HCL 50 MG PO TABS
50.0000 mg | ORAL_TABLET | Freq: Every morning | ORAL | Status: DC
  Administered 2016-10-12: 50 mg via ORAL
  Filled 2016-10-11: qty 1

## 2016-10-11 MED ORDER — PANTOPRAZOLE SODIUM 40 MG PO TBEC
40.0000 mg | DELAYED_RELEASE_TABLET | Freq: Every day | ORAL | Status: DC
  Administered 2016-10-11 – 2016-10-12 (×2): 40 mg via ORAL
  Filled 2016-10-11 (×2): qty 1

## 2016-10-11 MED ORDER — ONDANSETRON 4 MG PO TBDP
4.0000 mg | ORAL_TABLET | Freq: Once | ORAL | Status: AC | PRN
  Administered 2016-10-11: 4 mg via ORAL
  Filled 2016-10-11: qty 1

## 2016-10-11 MED ORDER — ONDANSETRON HCL 4 MG/2ML IJ SOLN
4.0000 mg | Freq: Once | INTRAMUSCULAR | Status: AC
  Administered 2016-10-11: 4 mg via INTRAVENOUS
  Filled 2016-10-11: qty 2

## 2016-10-11 MED ORDER — DEXTROSE 5 % IV SOLN
1.0000 g | INTRAVENOUS | Status: DC
  Filled 2016-10-11: qty 10

## 2016-10-11 MED ORDER — DIPHENHYDRAMINE HCL 25 MG PO CAPS: 25.0000 mg | ORAL_CAPSULE | ORAL | Status: DC | PRN

## 2016-10-11 MED ORDER — ACETAMINOPHEN 325 MG PO TABS: 650.0000 mg | ORAL_TABLET | Freq: Four times a day (QID) | ORAL | Status: DC | PRN

## 2016-10-11 MED ORDER — ONDANSETRON HCL 4 MG/2ML IJ SOLN
4.0000 mg | Freq: Three times a day (TID) | INTRAMUSCULAR | Status: AC | PRN
  Administered 2016-10-11: 4 mg via INTRAVENOUS
  Filled 2016-10-11 (×2): qty 2

## 2016-10-11 MED ORDER — HYDROMORPHONE HCL 1 MG/ML IJ SOLN
1.0000 mg | INTRAMUSCULAR | Status: DC | PRN
  Administered 2016-10-11: 1 mg via INTRAVENOUS
  Filled 2016-10-11: qty 1

## 2016-10-11 MED ORDER — OXYBUTYNIN CHLORIDE 5 MG PO TABS
5.0000 mg | ORAL_TABLET | Freq: Three times a day (TID) | ORAL | Status: DC
  Administered 2016-10-11 – 2016-10-12 (×2): 5 mg via ORAL
  Filled 2016-10-11 (×2): qty 1

## 2016-10-11 MED ORDER — POTASSIUM CHLORIDE IN NACL 40-0.9 MEQ/L-% IV SOLN
INTRAVENOUS | Status: DC
  Administered 2016-10-11 – 2016-10-12 (×2): 100 mL/h via INTRAVENOUS
  Filled 2016-10-11 (×3): qty 1000

## 2016-10-11 MED ORDER — MORPHINE SULFATE (PF) 4 MG/ML IV SOLN
4.0000 mg | Freq: Once | INTRAVENOUS | Status: AC
  Administered 2016-10-11: 4 mg via INTRAVENOUS
  Filled 2016-10-11: qty 1

## 2016-10-11 MED ORDER — POTASSIUM CHLORIDE CRYS ER 20 MEQ PO TBCR
40.0000 meq | EXTENDED_RELEASE_TABLET | Freq: Once | ORAL | Status: DC
  Filled 2016-10-11: qty 2

## 2016-10-11 MED ORDER — DIPHENHYDRAMINE HCL 25 MG PO CAPS
50.0000 mg | ORAL_CAPSULE | Freq: Once | ORAL | Status: AC
Start: 2016-10-11 — End: 2016-10-11
  Administered 2016-10-11: 50 mg via ORAL
  Filled 2016-10-11: qty 2

## 2016-10-11 MED ORDER — POLYETHYLENE GLYCOL 3350 17 G PO PACK
17.0000 g | PACK | Freq: Every day | ORAL | Status: DC
  Administered 2016-10-11 – 2016-10-12 (×2): 17 g via ORAL
  Filled 2016-10-11 (×2): qty 1

## 2016-10-11 MED ORDER — MORPHINE SULFATE (PF) 4 MG/ML IV SOLN
4.0000 mg | Freq: Once | INTRAVENOUS | Status: AC
Start: 2016-10-11 — End: 2016-10-11
  Administered 2016-10-11: 4 mg via INTRAVENOUS
  Filled 2016-10-11: qty 1

## 2016-10-11 MED ORDER — IOPAMIDOL (ISOVUE-300) INJECTION 61%
INTRAVENOUS | Status: AC
  Filled 2016-10-11: qty 100

## 2016-10-11 MED ORDER — ACETAMINOPHEN 650 MG RE SUPP: 650.0000 mg | Freq: Four times a day (QID) | RECTAL | Status: DC | PRN

## 2016-10-11 MED ORDER — KETOROLAC TROMETHAMINE 30 MG/ML IJ SOLN
30.0000 mg | Freq: Four times a day (QID) | INTRAMUSCULAR | Status: DC | PRN
  Administered 2016-10-12: 30 mg via INTRAVENOUS
  Filled 2016-10-11: qty 1

## 2016-10-11 MED ORDER — PHENAZOPYRIDINE HCL 200 MG PO TABS
200.0000 mg | ORAL_TABLET | Freq: Three times a day (TID) | ORAL | Status: DC | PRN
  Filled 2016-10-11 (×3): qty 1

## 2016-10-11 MED ORDER — PANTOPRAZOLE SODIUM 40 MG IV SOLR
40.0000 mg | Freq: Once | INTRAVENOUS | Status: AC
  Administered 2016-10-11: 40 mg via INTRAVENOUS
  Filled 2016-10-11: qty 40

## 2016-10-11 MED ORDER — IOPAMIDOL (ISOVUE-300) INJECTION 61%
100.0000 mL | Freq: Once | INTRAVENOUS | Status: AC | PRN
  Administered 2016-10-11: 100 mL via INTRAVENOUS

## 2016-10-11 MED ORDER — SODIUM CHLORIDE 0.9 % IJ SOLN
INTRAMUSCULAR | Status: AC
  Filled 2016-10-11: qty 50

## 2016-10-11 MED ORDER — DEXTROSE 5 % IV SOLN
1.0000 g | Freq: Once | INTRAVENOUS | Status: AC
  Administered 2016-10-11: 1 g via INTRAVENOUS
  Filled 2016-10-11: qty 10

## 2016-10-11 MED ORDER — BUSPIRONE HCL 5 MG PO TABS
5.0000 mg | ORAL_TABLET | Freq: Two times a day (BID) | ORAL | Status: DC
Start: 2016-10-11 — End: 2016-10-12
  Administered 2016-10-11 – 2016-10-12 (×2): 5 mg via ORAL
  Filled 2016-10-11 (×2): qty 1

## 2016-10-11 MED ORDER — SODIUM CHLORIDE 0.9 % IV BOLUS (SEPSIS)
1000.0000 mL | Freq: Once | INTRAVENOUS | Status: AC
  Administered 2016-10-11: 1000 mL via INTRAVENOUS

## 2016-10-11 MED ORDER — ONDANSETRON HCL 4 MG/2ML IJ SOLN
4.0000 mg | Freq: Four times a day (QID) | INTRAMUSCULAR | Status: DC | PRN
  Administered 2016-10-12: 4 mg via INTRAVENOUS

## 2016-10-11 MED ORDER — DICYCLOMINE HCL 20 MG PO TABS: 20.0000 mg | ORAL_TABLET | Freq: Four times a day (QID) | ORAL | Status: DC | PRN

## 2016-10-11 NOTE — ED Provider Notes (Signed)
[facility] DEPT Provider Note   CSN: [id number] Arrival date & time: 10/11/16  1133     History   Chief Complaint Chief Complaint  Patient presents with  . Abdominal Pain  . Emesis    HPI Jeanne Mcneil is a 30 y.o. female.  30 year old Caucasian female past medical history significant for anxiety, GERD, nephrolithiasis, chronic abdominal pain that presents to the ED today by EMS from the [facility] with complaints of chronic abdominal pain, left flank plain and nausea/vomiting. Patient states that her acute pain started approximately 3 days ago. States she passed a kidney stone 2 days ago. Patient endorses left flank pain that radiates to her lower abdomen. She also complains of epigastric pain that is chronic for her. Patient states she's had 10-12 episodes of nonbloody, nonbilious emesis since last night. She was seen in the ED yesterday for same. The time she had ultrasound showed no hydronephrosis. She was given IV fluids and pain medicine and felt better. She did have a moderate leukocytosis. Patient describes the pain as "sharp". Patient was given Phenergan and Percocet yesterday for the pain but states is not helping her pain today. Patient states she has a history of kidney stones and this feels the same. She denies any fevers but endorses chills. Patient also complains of urinary urgency, frequency, hematuria, dysuria. Patient is currently on her menstrual cycle. She denies any vaginal discharge or pelvic pain. Patient has a significant history of similar symptoms has been evaluated in ED for same several times. This is patient's 12 ED visits in the past 6 months. [facility] [place] [facility] [place] and had a LEEP several times due to emesis and dry heaving. In route by EMS patient does not follow directions and continue to drink water and forced himself to "vomit" in route. Patient denies any fever, headache, vision changes, lightheadedness, dizziness, chest pain, shortness of  breath, change in bowel habits, vaginal symptoms, numbness/tingling, hematachezia, or melena.           Past Medical History:  Diagnosis Date  . Anxiety   . Back pain   . Constipation   . Depression   . GERD (gastroesophageal reflux disease)   . Glaucoma of both eyes   . Hematuria   . History of abnormal cervical Pap smear   . History of chronic gastritis   . History of kidney stones   . Hydronephrosis, right   . Renal calculi    bilateral per ct [id number]  . Right ureteral stone   . Urgency of urination   . Uterine fibroid     Patient Active Problem List   Diagnosis Date Noted  . Nausea with vomiting 07/26/2016  . Ureteral stone 07/04/2016  . UTI (urinary tract infection) 07/04/2016  . Hydronephrosis of right kidney 07/04/2016  . GERD (gastroesophageal reflux disease) 07/04/2016  . Ureteral calculi 07/04/2016  . Abdominal pain, chronic, epigastric   . Hematemesis 05/16/2016  . Heme + stool 05/16/2016  . Abdominal pain, epigastric 05/16/2016    Past Surgical History:  Procedure Laterality Date  . CYSTOSCOPY W/ URETERAL STENT PLACEMENT Right 07/04/2016   Procedure: CYSTOSCOPY WITH RIGHT RETROGRADE PYELOGRAM, RIGHT URETERAL STENT PLACEMENT;  Surgeon: Ardis Hughs, MD;  Location: [facility];  Service: Urology;  Laterality: Right;  . CYSTOSCOPY WITH RETROGRADE PYELOGRAM, URETEROSCOPY AND STENT PLACEMENT Left 11/28/2007  . CYSTOSCOPY WITH RETROGRADE PYELOGRAM, URETEROSCOPY AND STENT PLACEMENT Right 08/02/2016   Procedure: CYSTOSCOPY WITH RIGHT  RETROGRADE PYELOGRAM, URETEROSCOPY,  AND STENT EXCHANGE;  Surgeon: Ardis Hughs, MD;  Location: [facility];  Service: Urology;  Laterality: Right;  . ESOPHAGOGASTRODUODENOSCOPY N/A 05/17/2016   Procedure: ESOPHAGOGASTRODUODENOSCOPY (EGD);  Surgeon: Danie Binder, MD;  Location: [facility];  Service: Endoscopy;  Laterality: N/A;  54     OB History    Gravida Para Term Preterm AB Living   3 2 2   1 2      SAB TAB Ectopic Multiple Live Births   1       2       Home Medications    Prior to Admission medications   Medication Sig Start Date End Date Taking? Authorizing Provider  acetaminophen (TYLENOL) 500 MG tablet Take 1,000 mg by mouth every 6 (six) hours as needed.   Yes Historical Provider, MD  busPIRone (BUSPAR) 5 MG tablet Take 5 mg by mouth 2 (two) times daily.   Yes Historical Provider, MD  dexlansoprazole (DEXILANT) 60 MG capsule Take 1 capsule (60 mg total) by mouth daily. Patient taking differently: Take 60 mg by mouth every morning.  07/26/16  Yes Carlis Stable, NP  ondansetron (ZOFRAN ODT) 4 MG disintegrating tablet Take 1 tablet (4 mg total) by mouth every 8 (eight) hours as needed for nausea or vomiting. 08/26/16  Yes Nona Dell, PA-C  oxybutynin (DITROPAN) 5 MG tablet Take 1 tablet (5 mg total) by mouth 3 (three) times daily. 07/16/16  Yes Olivia Canter Sam, PA-C  oxyCODONE-acetaminophen (PERCOCET/ROXICET) 5-325 MG tablet Take 2 tablets by mouth every 4 (four) hours as needed for severe pain. 10/10/16  Yes Lacretia Leigh, MD  phenazopyridine (PYRIDIUM) 200 MG tablet Take 1 tablet (200 mg total) by mouth 3 (three) times daily as needed for pain. 08/02/16  Yes Ardis Hughs, MD  promethazine (PHENERGAN) 25 MG suppository Place 1 suppository (25 mg total) rectally every 6 (six) hours as needed for nausea or vomiting. 08/26/16  Yes Nona Dell, PA-C  promethazine (PHENERGAN) 25 MG tablet Take 1 tablet (25 mg total) by mouth every 6 (six) hours as needed for nausea or vomiting. 10/10/16  Yes Lacretia Leigh, MD  sertraline (ZOLOFT) 50 MG tablet Take 50 mg by mouth every morning.    Yes Historical Provider, MD  dicyclomine (BENTYL) 20 MG tablet Take 1 tablet (20 mg total) by mouth every 6 (six) hours as needed for spasms (abdominal cramping). Patient not taking: Reported on 10/11/2016 07/22/16   Francine Graven, DO  diphenhydramine-acetaminophen (TYLENOL PM) 25-500 MG  TABS tablet Take 1 tablet by mouth at bedtime as needed.    Historical Provider, MD  lidocaine (XYLOCAINE) 2 % solution Use as directed 15 mLs in the mouth or throat every 6 (six) hours as needed for mouth pain. Patient not taking: Reported on 10/11/2016 07/26/16   Carlis Stable, NP  traMADol (ULTRAM) 50 MG tablet Take 1-2 tablets (50-100 mg total) by mouth every 6 (six) hours as needed for moderate pain. Patient not taking: Reported on 10/11/2016 08/02/16   Ardis Hughs, MD    Family History Family History  Problem Relation Age of Onset  . Colon cancer Paternal Uncle     Passed away 02-11-15    Social History Social History  Substance Use Topics  . Smoking status: Current Every Day Smoker    Packs/day: 0.50    Years: 20.00    Types: Cigarettes  . Smokeless tobacco: Never Used  . Alcohol use 2.4 oz/week    4 Shots of liquor per  week     Comment: No ETOH in 4 months; Previously: occasionally, once every 2-3 months.     Allergies   Keflex [cephalexin]; Codeine; Percocet [oxycodone-acetaminophen]; Tape; and Vicodin [hydrocodone-acetaminophen]   Review of Systems Review of Systems  Constitutional: Positive for chills. Negative for fever.  HENT: Negative for congestion.   Eyes: Negative for visual disturbance.  Respiratory: Negative for cough and shortness of breath.   Cardiovascular: Negative for chest pain and palpitations.  Gastrointestinal: Positive for abdominal pain (chronic), nausea and vomiting. Negative for blood in stool and diarrhea.  Genitourinary: Positive for dysuria, flank pain (left), frequency, hematuria, urgency and vaginal bleeding (currenlty on menstrual cycle). Negative for vaginal discharge and vaginal pain.  Musculoskeletal: Positive for back pain (left flank pain).  Skin: Negative.   Neurological: Negative for dizziness, weakness, light-headedness, numbness and headaches.  All other systems reviewed and are negative.    Physical Exam Updated Vital  Signs BP 173/100 (BP Location: Right Arm)   Pulse 88   Temp 97.4 F (36.3 C) (Oral)   Resp 18   SpO2 97%   Physical Exam  Constitutional: She appears well-developed and well-nourished. No distress.  Patientin the floor holding her left flank and abdomen. She appears uncomfortable. She is currently crying in the room due to the pain.  HENT:  Head: Normocephalic and atraumatic.  Mouth/Throat: Uvula is midline and oropharynx is clear and moist. Mucous membranes are not dry.  Eyes: Conjunctivae are normal. Pupils are equal, round, and reactive to light. Right eye exhibits no discharge. Left eye exhibits no discharge. No scleral icterus.  Neck: Normal range of motion. Neck supple. No thyromegaly present.  Cardiovascular: Normal rate, regular rhythm, normal heart sounds and intact distal pulses.  Exam reveals no gallop and no friction rub.   No murmur heard. Pulmonary/Chest: Effort normal and breath sounds normal. No respiratory distress.  Abdominal: Soft. Bowel sounds are normal. She exhibits no distension. There is generalized tenderness. There is CVA tenderness (left). There is no rigidity, no rebound, no guarding, no tenderness at McBurney's point and negative Murphy's sign.  Musculoskeletal: Normal range of motion.  Lymphadenopathy:    She has no cervical adenopathy.  Neurological: She is alert.  Skin: Skin is warm and dry. Capillary refill takes less than 2 seconds.  Nursing note and vitals reviewed.    ED Treatments / Results  Labs (all labs ordered are listed, but only abnormal results are displayed) Labs Reviewed  COMPREHENSIVE METABOLIC PANEL - Abnormal; Notable for the following:       Result Value   Potassium 3.1 (*)    CO2 20 (*)    Glucose, Bld 143 (*)    All other components within normal limits  CBC - Abnormal; Notable for the following:    WBC 18.4 (*)    All other components within normal limits  URINALYSIS, ROUTINE W REFLEX MICROSCOPIC (NOT AT [facility]) - Abnormal;  Notable for the following:    Color, Urine RED (*)    APPearance CLOUDY (*)    Specific Gravity, Urine 1.036 (*)    Hgb urine dipstick LARGE (*)    Bilirubin Urine SMALL (*)    Ketones, ur >80 (*)    Protein, ur 100 (*)    Leukocytes, UA MODERATE (*)    All other components within normal limits  URINE MICROSCOPIC-ADD ON - Abnormal; Notable for the following:    Squamous Epithelial / LPF 0-5 (*)    Bacteria, UA MANY (*)  All other components within normal limits  URINE CULTURE  LIPASE, BLOOD  I-STAT BETA HCG BLOOD, ED ([facility], [facility], [facility] ONLY)    EKG  EKG Interpretation None       Radiology US Renal  Result Date: 10/10/2016 CLINICAL DATA:  Left flank pain EXAM: RENAL ULTRASOUND COMPARISON:  CT abdomen and pelvis July 22, 2016 FINDINGS: Right Kidney: Length: 11.7 cm. Echogenicity and renal cortical thickness are within normal limits. No mass, perinephric fluid, or hydronephrosis visualized. No sonographically demonstrable calculus or ureterectasis. Left Kidney: Length: 10.7 cm. Echogenicity and renal cortical thickness are within normal limits. No mass, perinephric fluid, or hydronephrosis visualized. No sonographically demonstrable calculus or ureterectasis. Bladder: Empty and cannot be assessed by ultrasound at this time. IMPRESSION: Normal appearing kidneys bilaterally. Electronically Signed   By: Lowella Grip III M.D.   On: 10/10/2016 10:39    Procedures Procedures (including critical care time)  Medications Ordered in ED Medications  sodium chloride 0.9 % bolus 1,000 mL (not administered)  ondansetron (ZOFRAN) injection 4 mg (not administered)  morphine 4 MG/ML injection 4 mg (not administered)  ondansetron (ZOFRAN-ODT) disintegrating tablet 4 mg (4 mg Oral Given 10/11/16 1239)     Initial Impression / Assessment and Plan / ED Course  I have reviewed the triage vital signs and the nursing notes.  Pertinent labs & imaging results that were available during my care  of the patient were reviewed by me and considered in my medical decision making (see chart for details).  Clinical Course   Patient presents to the ED with right flank pain, n/v, and chronic epigastric pain. Patient with leukocytosis of 18. CT was performed that showed  4X4 millimeter calculus in the left kidney which was nonobstructing and without hydronephrosis and a hiatal hernia. There were no ureteral calculi seen. UA consistent with UTI. Urine culture sent. I spoke with urology Dr. Donnella Sham that feels this is not an infected stone. He feels history and sympstoms are consistent with complicated pyelo and suggest outpatient abx with close follow up in the outpatient setting. Given patient continued flank pain and ongoing n/v despite pain and nausea meds I feel patient needs admission for pyelonephritis and IV fluids, pain control and abx. Patient was sene and examined by Dr. Lita Mains who agrees with the above plan. Patient is agreeable to the above plan. Spoke with Dr. Rockne Menghini who is agree to admit patient. Patient is hemodynamically stable at this time she is afebrile. She is hypertensive likely due to pain, but needs close follow up in the outpatient setting by a pcp.    Final Clinical Impressions(s) / ED Diagnoses   Final diagnoses:  Pyelonephritis  Chronic abdominal pain    New Prescriptions New Prescriptions   No medications on file     Doristine Devoid, PA-C 10/11/16 2315    Julianne Rice, MD 10/13/16 [phone]

## 2016-10-11 NOTE — H&P (Addendum)
History and Physical:    PARALEE Mcneil   BHA:[id number] DOB: 11-19-1986 DOA: 10/11/2016  Referring MD/provider: Doristine Devoid, PA-C PCP: Bonney Aid, PA-C [facility] Urologist: Dr. Louis Meckel  Patient coming from: Home  Chief Complaint: Left flank pain.  History of Present Illness:   Jeanne Mcneil is an 30 y.o. female with PMH of nephrolithiasis and renal colic as well as chronic abdominal pain presents with 2 day history of left-sided flank pain similar to her prior kidney stones. States that she passed a kidney stone yesterday (small brown arrow shaped stone in ziplock baggie noted). She continues to endorse colicky pain 97/35 at its worst without hematuria or dysuria. Patient ran out of her usual medications and therefore was unable to treat her symptoms at home. She was given 8 mg of Zofran and fentanyl via EMS and Dilaudid in the ED. After being given medications, the patient was observed to be putting her fingers down her throat. She has had 9 ED visits in the past 6 months and 2 hospital admissions. Currently pain free after being medicated in the ED. according to the EDP, she had emesis which is been nonbilious or bloody. Denies any vaginal bleeding or discharge. Sees Dr. Louis Meckel and has had multiple pyelograms and ureteral stents. She says there is a strong family history of hypercalciuria.  ED Course:  The patient Had a CT of the abdomen showed a 4X4 millimeter calculus in the left kidney which was nonobstructing and without hydronephrosis. No ureteral calculi were found. Urinalysis showed too numerous to count WBCs, 6-30 WBCs and many bacteria. She was referred for admission secondary to ongoing nausea/vomiting and need for IV antibiotics.  ROS:   Review of Systems  Constitutional: Positive for chills and malaise/fatigue. Negative for fever.  HENT: Positive for sore throat.        Congestion left ear.  Eyes: Negative.   Respiratory: Negative for cough and  shortness of breath.   Cardiovascular: Positive for chest pain.       Associated with vomiting.  Gastrointestinal: Positive for abdominal pain, constipation, nausea and vomiting.  Genitourinary: Positive for dysuria and flank pain.       Pressure feeling with urination.  Skin: Negative.   Neurological: Negative.   Endo/Heme/Allergies: Negative.   Psychiatric/Behavioral: Negative.      Past Medical History:   Past Medical History:  Diagnosis Date  . Anxiety   . Back pain   . Constipation   . Depression   . GERD (gastroesophageal reflux disease)   . Glaucoma of both eyes   . Hematuria   . History of abnormal cervical Pap smear   . History of chronic gastritis   . History of kidney stones   . Hydronephrosis, right   . Renal calculi    bilateral per ct [id number]  . Right ureteral stone   . Urgency of urination   . Uterine fibroid     Past Surgical History:   Past Surgical History:  Procedure Laterality Date  . CYSTOSCOPY W/ URETERAL STENT PLACEMENT Right 07/04/2016   Procedure: CYSTOSCOPY WITH RIGHT RETROGRADE PYELOGRAM, RIGHT URETERAL STENT PLACEMENT;  Surgeon: Ardis Hughs, MD;  Location: [facility];  Service: Urology;  Laterality: Right;  . CYSTOSCOPY WITH RETROGRADE PYELOGRAM, URETEROSCOPY AND STENT PLACEMENT Left 11/28/2007  . CYSTOSCOPY WITH RETROGRADE PYELOGRAM, URETEROSCOPY AND STENT PLACEMENT Right 08/02/2016   Procedure: CYSTOSCOPY WITH RIGHT  RETROGRADE PYELOGRAM, URETEROSCOPY,  AND STENT EXCHANGE;  Surgeon: Ardis Hughs, MD;  Location: [facility];  Service: Urology;  Laterality: Right;  . ESOPHAGOGASTRODUODENOSCOPY N/A 05/17/2016   Procedure: ESOPHAGOGASTRODUODENOSCOPY (EGD);  Surgeon: Danie Binder, MD;  Location: [facility];  Service: Endoscopy;  Laterality: N/A;  830     Social History:   Social History   Social History  . Marital status: Single    Spouse name: N/A  . Number of children: 2  . Years of education: 11    Occupational History  . Unemployed    Social History Main Topics  . Smoking status: Current Every Day Smoker    Packs/day: 0.50    Years: 20.00    Types: Cigarettes  . Smokeless tobacco: Never Used  . Alcohol use 2.4 oz/week    4 Shots of liquor per week     Comment: No ETOH in 4 months; Previously: occasionally, once every 2-3 months.  . Drug use:     Types: Marijuana, Barbituates     Comment: "Only when I feel nauseated and can't function"  . Sexual activity: Yes    Birth control/ protection: None   Other Topics Concern  . Not on file   Social History Narrative   Lives with her father and step-mother. Unemployed.    Allergies   Keflex [cephalexin]; Codeine; Percocet [oxycodone-acetaminophen]; Tape; and Vicodin [hydrocodone-acetaminophen]  Family history:   Family History  Problem Relation Age of Onset  . Colon cancer Paternal Uncle     Passed away 2015/01/28  . Nephrolithiasis Father   . Hypertension Father   . Nephrolithiasis Sister   . Nephrolithiasis Sister   . Nephrolithiasis Sister   . Hypertension Mother     Current Medications:   Prior to Admission medications   Medication Sig Start Date End Date Taking? Authorizing Provider  acetaminophen (TYLENOL) 500 MG tablet Take 1,000 mg by mouth every 6 (six) hours as needed.   Yes Historical Provider, MD  busPIRone (BUSPAR) 5 MG tablet Take 5 mg by mouth 2 (two) times daily.   Yes Historical Provider, MD  dexlansoprazole (DEXILANT) 60 MG capsule Take 1 capsule (60 mg total) by mouth daily. Patient taking differently: Take 60 mg by mouth every morning.  07/26/16  Yes Carlis Stable, NP  ondansetron (ZOFRAN ODT) 4 MG disintegrating tablet Take 1 tablet (4 mg total) by mouth every 8 (eight) hours as needed for nausea or vomiting. 08/26/16  Yes Nona Dell, PA-C  oxybutynin (DITROPAN) 5 MG tablet Take 1 tablet (5 mg total) by mouth 3 (three) times daily. 07/16/16  Yes Olivia Canter Sam, PA-C  oxyCODONE-acetaminophen  (PERCOCET/ROXICET) 5-325 MG tablet Take 2 tablets by mouth every 4 (four) hours as needed for severe pain. 10/10/16  Yes Lacretia Leigh, MD  phenazopyridine (PYRIDIUM) 200 MG tablet Take 1 tablet (200 mg total) by mouth 3 (three) times daily as needed for pain. 08/02/16  Yes Ardis Hughs, MD  promethazine (PHENERGAN) 25 MG suppository Place 1 suppository (25 mg total) rectally every 6 (six) hours as needed for nausea or vomiting. 08/26/16  Yes Nona Dell, PA-C  promethazine (PHENERGAN) 25 MG tablet Take 1 tablet (25 mg total) by mouth every 6 (six) hours as needed for nausea or vomiting. 10/10/16  Yes Lacretia Leigh, MD  sertraline (ZOLOFT) 50 MG tablet Take 50 mg by mouth every morning.    Yes Historical Provider, MD  dicyclomine (BENTYL) 20 MG tablet Take 1 tablet (20 mg total) by mouth every 6 (six) hours as needed for spasms (abdominal cramping).  Patient not taking: Reported on 10/11/2016 07/22/16   Francine Graven, DO  diphenhydramine-acetaminophen (TYLENOL PM) 25-500 MG TABS tablet Take 1 tablet by mouth at bedtime as needed.    Historical Provider, MD  lidocaine (XYLOCAINE) 2 % solution Use as directed 15 mLs in the mouth or throat every 6 (six) hours as needed for mouth pain. Patient not taking: Reported on 10/11/2016 07/26/16   Carlis Stable, NP  traMADol (ULTRAM) 50 MG tablet Take 1-2 tablets (50-100 mg total) by mouth every 6 (six) hours as needed for moderate pain. Patient not taking: Reported on 10/11/2016 08/02/16   Ardis Hughs, MD    Physical Exam:   Vitals:   10/11/16 1137 10/11/16 1150 10/11/16 1808 10/11/16 1940  BP: (!) 153/125 173/100 (!) 163/110 (!) 161/99  Pulse: (!) 45 88 82 61  Resp: 18  18 18   Temp: 97.4 F (36.3 C)  98 F (36.7 C) 97.8 F (36.6 C)  TempSrc: Oral  Oral Oral  SpO2: 97%  100% 100%     Physical Exam: Blood pressure (!) 161/99, pulse 61, temperature 97.8 F (36.6 C), temperature source Oral, resp. rate 18, SpO2 100 %. Gen: No  acute distress. Head: Normocephalic, atraumatic. Eyes: Pupils equal, round and reactive to light. Extraocular movements intact.  Sclerae nonicteric. No lid lag. Mouth: Oropharynx reveals moist mucous membranes. Dentition is good. Neck: Supple, no thyromegaly, no lymphadenopathy, no jugular venous distention. Chest: Lungs are clear to auscultation with good air movement. No rales, rhonchi or wheezes.  CV: Heart sounds are regular with an S1, S2. No murmurs, rubs, clicks, or gallops.  Abdomen: Soft, tender left lower quadrant and flank with normal active bowel sounds. No hepatosplenomegaly or palpable masses. Extremities: Extremities are without clubbing, edema, or cyanosis. Pedal pulses 2+.  Skin: Warm and dry. No rashes, lesions or wounds. Multiple tattoos. Neuro: Alert and oriented times 3; grossly nonfocal.  Psych: Insight is good and judgment is appropriate. Mood and affect normal.   Data Review:    Labs: Basic Metabolic Panel:  Recent Labs Lab 10/10/16 1009 10/11/16 1248  NA 139 135  K 3.3* 3.1*  CL 103 101  CO2 19* 20*  GLUCOSE 198* 143*  BUN 15 15  CREATININE 0.98 0.88  CALCIUM 9.7 9.4   Liver Function Tests:  Recent Labs Lab 10/10/16 1009 10/11/16 1248  AST 40 41  ALT 43 43  ALKPHOS 65 67  BILITOT 0.9 1.2  PROT 7.8 7.7  ALBUMIN 4.8 4.8    Recent Labs Lab 10/10/16 1009 10/11/16 1248  LIPASE 15 18   No results for input(s): AMMONIA in the last 168 hours. CBC:  Recent Labs Lab 10/10/16 1009 10/11/16 1248  WBC 28.0* 18.4*  NEUTROABS 23.5*  --   HGB 13.0 12.2  HCT 38.1 36.8  MCV 86.2 87.4  PLT 402* 386    Urinalysis    Component Value Date/Time   COLORURINE RED (A) 10/11/2016 1201   APPEARANCEUR CLOUDY (A) 10/11/2016 1201   LABSPEC 1.036 (H) 10/11/2016 1201   PHURINE 7.5 10/11/2016 1201   GLUCOSEU NEGATIVE 10/11/2016 1201   HGBUR LARGE (A) 10/11/2016 1201   BILIRUBINUR SMALL (A) 10/11/2016 1201   KETONESUR >80 (A) 10/11/2016 1201    PROTEINUR 100 (A) 10/11/2016 1201   UROBILINOGEN 0.2 07/02/2015 1757   NITRITE NEGATIVE 10/11/2016 1201   LEUKOCYTESUR MODERATE (A) 10/11/2016 1201      Radiographic Studies Personally reviewed: Ct Abdomen Pelvis W Contrast  Result Date: 10/11/2016 CLINICAL DATA:  Abdominal wall/left flank region pain with nausea and vomiting EXAM: CT ABDOMEN AND PELVIS WITH CONTRAST TECHNIQUE: Multidetector CT imaging of the abdomen and pelvis was performed using the standard protocol following bolus administration of intravenous contrast. CONTRAST:  157m ISOVUE-300 IOPAMIDOL (ISOVUE-300) INJECTION 61% COMPARISON:  July 22, 2016 FINDINGS: Lower chest: There is posterior bibasilar atelectatic change. Lung bases otherwise are clear. There is a small hiatal hernia. Hepatobiliary: There is fatty infiltration near the fissure for the ligamentum teres. Beyond this focal fatty infiltration, no liver lesions are apparent. Gallbladder wall is not appreciably thickened. There is no biliary duct dilatation. Pancreas: There is no pancreatic mass or inflammatory focus. Spleen: No splenic lesions are evident. Adrenals/Urinary Tract: Adrenals bilaterally appear unremarkable. There is a cyst in the upper pole of the left kidney measuring 1.2 x 1.2 cm. There is no hydronephrosis on either side. There is a 4 x 4 mm calculus in the mid left kidney. No ureteral calculi are evident on either side. Urinary bladder is midline with wall thickness within normal limits. Stomach/Bowel: There is no appreciable bowel wall or mesenteric thickening. There are occasional sigmoid diverticula without diverticulitis. Colon is largely collapsed. There is no bowel obstruction. No free air or portal venous air. Vascular/Lymphatic: There is no abdominal aortic aneurysm. No vascular lesions are evident. There is no appreciable adenopathy in the abdomen or pelvis. Reproductive: Uterus is anteverted. There is no evident pelvic mass or pelvic fluid  collection. Other: The appendix appears normal. There is no ascites or abscess in the abdomen or pelvis. There is a minimal ventral hernia containing only fat. Musculoskeletal: There is lumbar dextroscoliosis. There are no blastic or lytic bone lesions. There is no intramuscular or abdominal wall lesion. IMPRESSION: 4 x 4 mm calculus in the left kidney, nonobstructing. No hydronephrosis. No ureteral calculus on either side. Occasional sigmoid diverticula without diverticulitis. No bowel obstruction. No abscess. Appendix appears normal. Small hiatal hernia present. Minimal ventral hernia containing only fat. Electronically Signed   By: WLowella GripIII M.D.   On: 10/11/2016 16:43   [place]  Result Date: 10/10/2016 CLINICAL DATA:  Left flank pain EXAM: RENAL ULTRASOUND COMPARISON:  CT abdomen and pelvis July 22, 2016 FINDINGS: Right Kidney: Length: 11.7 cm. Echogenicity and renal cortical thickness are within normal limits. No mass, perinephric fluid, or hydronephrosis visualized. No sonographically demonstrable calculus or ureterectasis. Left Kidney: Length: 10.7 cm. Echogenicity and renal cortical thickness are within normal limits. No mass, perinephric fluid, or hydronephrosis visualized. No sonographically demonstrable calculus or ureterectasis. Bladder: Empty and cannot be assessed by ultrasound at this time. IMPRESSION: Normal appearing kidneys bilaterally. Electronically Signed   By: WLowella GripIII M.D.   On: 10/10/2016 10:39    EKG: None performed.   Assessment/Plan:   Principal Problem:   Pyelonephritis in the setting of left nephrolithiasis and renal colic on the left The patient will be observed overnight and treated with Rocephin. If her WBC down and she can tolerate oral antibiotics, she can likely be discharged home tomorrow on oral therapy.Will treat pain with IV Toradol and nausea with IV Zofran. Continue Ditropan. Continue home dose of oxycodone as needed for  pain.  Active Problems:   Chronic abdominal pain Continue PPI and Bentyl as needed.    Hypokalemia Likely from GI losses secondary to nausea/vomiting. We'll add potassium to her IV fluids.    Hyperglycemia Check hemoglobin A1c.    Elevated blood pressure reading Follow-up with PCP for evaluation of hypertension. Blood pressure  high, but may be related to renal colic.    Anxiety disorder Continue Zoloft and BuSpar.   Other information:   DVT prophylaxis: Low risk patient, early ambulation. Code Status: Full code. Family Communication: No family at the bedside. Disposition Plan: Home 10/12/16 if can tolerate switch to oral antibiotics. Consults called: Telephone consultation with urologist felt the patient can be managed with outpatient follow-up. Admission status: Observation.   The medical decision making on this patient was of high complexity with multiple data points to review including old records.  Cartrell Bentsen [facility] Pager [phone] Cell: [phone]   If 7PM-7AM, please contact night-coverage [email or web address] Password [facility] 10/11/2016, 8:57 PM

## 2016-10-11 NOTE — Progress Notes (Signed)
Pt with [facility] ED visits x 10 and admission x 1 in the last 6 months [facility] with dry heaving Pt noted during a 10 minute time frame as ED CM was at nursing station, to come out of her room looking for ED RN, asking for ED RN and going to restroom near room #10 x 2 with audible sounds similar to sob but pt noted to walk without dizziness without complications. Once pt would return to her room she would be noted pacing vs resting Pt without ED CP nor [facility] referral availability Email to [email or web address]

## 2016-10-11 NOTE — ED Triage Notes (Signed)
Per EMS, Pt, from [place], c/o chronic abdominal pain, L flank pain, and n/v.  Pain score 10/10.  Pt was seen yesterday at [facility] [address] for same.  Pt reports that she was at the [facility] and left her medication in the car.  Pt dry heaving en route.  EMS reports that Pt would not follow directions, continually drank water, and forced herself to "vomit" en route.  Hx of kidney stones.

## 2016-10-11 NOTE — Progress Notes (Signed)
Patient noted to have been seen in the ED 9 times within the last six months.  EDCM spoke to patient at bedside.  Patient reports she doesn't have any way to get to her doctor appointments.  She reports her pcp is located at [facility].  She reports she hasn't been there, "in a long time."  She does not drive.  She reports her best friend drives but lives in [place].  She reports she cannot take a bus because [place] outside city limits.  She reports she cannot take a cab because she doesn't have the money to take a cab.  EDCM strongly encouraged patient to ask family and friends to assist her in getting her to her doctor's appointments.  EDCM discussed the importance of getting to her doctor's appointments for her health.  Patient verbalized understanding.

## 2016-10-12 DIAGNOSIS — F411 Generalized anxiety disorder: Secondary | ICD-10-CM

## 2016-10-12 DIAGNOSIS — R739 Hyperglycemia, unspecified: Secondary | ICD-10-CM

## 2016-10-12 DIAGNOSIS — N12 Tubulo-interstitial nephritis, not specified as acute or chronic: Principal | ICD-10-CM

## 2016-10-12 DIAGNOSIS — R030 Elevated blood-pressure reading, without diagnosis of hypertension: Secondary | ICD-10-CM

## 2016-10-12 DIAGNOSIS — N200 Calculus of kidney: Secondary | ICD-10-CM

## 2016-10-12 LAB — BASIC METABOLIC PANEL
Anion gap: 4 — ABNORMAL LOW (ref 5–15)
BUN: 11 mg/dL (ref 6–20)
CHLORIDE: 107 mmol/L (ref 101–111)
CO2: 25 mmol/L (ref 22–32)
CREATININE: 0.67 mg/dL (ref 0.44–1.00)
Calcium: 8.3 mg/dL — ABNORMAL LOW (ref 8.9–10.3)
GFR calc Af Amer: >60 mL/min (ref >60)
GFR calc non Af Amer: >60 mL/min (ref >60)
GLUCOSE: 87 mg/dL (ref 65–99)
Potassium: 3.5 mmol/L (ref 3.5–5.1)
SODIUM: 136 mmol/L (ref 135–145)

## 2016-10-12 LAB — CBC
HCT: 30.4 % — ABNORMAL LOW (ref 36.0–46.0)
Hemoglobin: 10 g/dL — ABNORMAL LOW (ref 12.0–15.0)
MCH: 29.2 pg (ref 26.0–34.0)
MCHC: 32.9 g/dL (ref 30.0–36.0)
MCV: 88.6 fL (ref 78.0–100.0)
PLATELETS: 275 10*3/uL (ref 150–400)
RBC: 3.43 MIL/uL — ABNORMAL LOW (ref 3.87–5.11)
RDW: 15.1 % (ref 11.5–15.5)
WBC: 11.7 10*3/uL — ABNORMAL HIGH (ref 4.0–10.5)

## 2016-10-12 MED ORDER — KETOROLAC TROMETHAMINE 10 MG PO TABS: 10.0000 mg | ORAL_TABLET | Freq: Four times a day (QID) | ORAL | 0 refills | Status: DC | PRN

## 2016-10-12 MED ORDER — GI COCKTAIL ~~LOC~~
30.0000 mL | Freq: Once | ORAL | Status: AC
  Administered 2016-10-12: 30 mL via ORAL
  Filled 2016-10-12: qty 30

## 2016-10-12 MED ORDER — CIPROFLOXACIN HCL 500 MG PO TABS: 500.0000 mg | ORAL_TABLET | Freq: Two times a day (BID) | ORAL | 0 refills | Status: DC

## 2016-10-12 NOTE — Discharge Summary (Signed)
Physician Discharge Summary  Jeanne Mcneil MLY:[id number] DOB: 1986-08-10 DOA: 10/11/2016  PCP: Bonney Aid, PA-C  Admit date: 10/11/2016 Discharge date: 10/12/2016  Admitted From: Home Disposition:  Home  Recommendations for Outpatient Follow-up:  1. Follow up with PCP in 1-2 weeks 2. Follow up with Urology as scheduled  Discharge Condition:Stable CODE STATUS:Full Diet recommendation: Regular   Brief/Interim Summary: 30 y.o. female with PMH of nephrolithiasis and renal colic as well as chronic abdominal pain presents with 2 day history of left-sided flank pain similar to her prior kidney stones. States that she passed a kidney stone yesterday (small brown arrow shaped stone in ziplock baggie noted). She continues to endorse colicky pain 81/27 at its worst without hematuria or dysuria. Patient ran out of her usual medications and therefore was unable to treat her symptoms at home. She was given 8 mg of Zofran and fentanyl via EMS and Dilaudid in the ED. After being given medications, the patient was observed to be putting her fingers down her throat. She has had 9 ED visits in the past 6 months and 2 hospital admissions. Currently pain free after being medicated in the ED. according to the EDP, she had emesis which is been nonbilious or bloody. Denies any vaginal bleeding or discharge. Sees Dr. Louis Meckel and has had multiple pyelograms and ureteral stents. She says there is a strong family history of hypercalciuria.    Pyelonephritis in the setting of left nephrolithiasis and renal colic on the left The patient was observed overnight and treated with Rocephin. White blood cells have improved markedly, pain better controlled with Toradol. Patient is followed by urology for prior history of renal stones. Will have patient follow-up with her urologist as scheduled. We will complete course of antibiotics with ciprofloxacin. At this time, urine cultures pending pending. Prior urine cultures were  unremarkable.    Chronic abdominal pain Continued PPI and Bentyl as needed.    Hypokalemia Likely from GI losses secondary to nausea/vomiting.     Hyperglycemia A1c was ordered, still pending at time of discharge    Elevated blood pressure reading Follow-up with PCP for evaluation of hypertension. Blood pressure high, but may be related to renal colic.    Anxiety disorder Continue Zoloft and BuSpar.  Discharge Diagnoses:  Principal Problem:   Pyelonephritis Active Problems:   Abdominal pain, chronic, epigastric   Hypokalemia   Hyperglycemia   Elevated blood pressure reading   Left nephrolithiasis   Renal colic on left side   Generalized anxiety disorder    Discharge Instructions     Medication List    STOP taking these medications   lidocaine 2 % solution Commonly known as:  XYLOCAINE   traMADol 50 MG tablet Commonly known as:  ULTRAM     TAKE these medications   acetaminophen 500 MG tablet Commonly known as:  TYLENOL Take 1,000 mg by mouth every 6 (six) hours as needed.   busPIRone 5 MG tablet Commonly known as:  BUSPAR Take 5 mg by mouth 2 (two) times daily.   ciprofloxacin 500 MG tablet Commonly known as:  CIPRO Take 1 tablet (500 mg total) by mouth 2 (two) times daily.   dexlansoprazole 60 MG capsule Commonly known as:  DEXILANT Take 1 capsule (60 mg total) by mouth daily. What changed:  when to take this   dicyclomine 20 MG tablet Commonly known as:  BENTYL Take 1 tablet (20 mg total) by mouth every 6 (six) hours as needed for spasms (abdominal cramping).  diphenhydramine-acetaminophen 25-500 MG Tabs tablet Commonly known as:  TYLENOL PM Take 1 tablet by mouth at bedtime as needed.   ketorolac 10 MG tablet Commonly known as:  TORADOL Take 1 tablet (10 mg total) by mouth every 6 (six) hours as needed.   ondansetron 4 MG disintegrating tablet Commonly known as:  ZOFRAN ODT Take 1 tablet (4 mg total) by mouth every 8 (eight) hours  as needed for nausea or vomiting.   oxybutynin 5 MG tablet Commonly known as:  DITROPAN Take 1 tablet (5 mg total) by mouth 3 (three) times daily.   oxyCODONE-acetaminophen 5-325 MG tablet Commonly known as:  PERCOCET/ROXICET Take 2 tablets by mouth every 4 (four) hours as needed for severe pain.   phenazopyridine 200 MG tablet Commonly known as:  PYRIDIUM Take 1 tablet (200 mg total) by mouth 3 (three) times daily as needed for pain.   promethazine 25 MG suppository Commonly known as:  PHENERGAN Place 1 suppository (25 mg total) rectally every 6 (six) hours as needed for nausea or vomiting.   promethazine 25 MG tablet Commonly known as:  PHENERGAN Take 1 tablet (25 mg total) by mouth every 6 (six) hours as needed for nausea or vomiting.   sertraline 50 MG tablet Commonly known as:  ZOLOFT Take 50 mg by mouth every morning.      Follow-up Information    Bonney Aid, PA-C. Schedule an appointment as soon as possible for a visit in 1 week(s).   Specialty:  General Practice Contact information: [address] [place] [address] [phone]        Ardis Hughs, MD. Schedule an appointment as soon as possible for a visit.   Specialty:  Urology Contact information: [address]   [address] [phone]          Allergies  Allergen Reactions  . Keflex [Cephalexin] Other (See Comments)    Bladder spasms  . Codeine Hives, Itching and Other (See Comments)    Able to take Dilaudid and Ultram.  . Tape Other (See Comments)    Regular tape burns skin can only use paper tape  . Vicodin [Hydrocodone-Acetaminophen] Hives and Itching    Procedures/Studies: Ct Abdomen Pelvis W Contrast  Result Date: 10/11/2016 CLINICAL DATA:  Abdominal wall/left flank region pain with nausea and vomiting EXAM: CT ABDOMEN AND PELVIS WITH CONTRAST TECHNIQUE: Multidetector CT imaging of the abdomen and pelvis was performed using the standard protocol following  bolus administration of intravenous contrast. CONTRAST:  124m ISOVUE-300 IOPAMIDOL (ISOVUE-300) INJECTION 61% COMPARISON:  July 22, 2016 FINDINGS: Lower chest: There is posterior bibasilar atelectatic change. Lung bases otherwise are clear. There is a small hiatal hernia. Hepatobiliary: There is fatty infiltration near the fissure for the ligamentum teres. Beyond this focal fatty infiltration, no liver lesions are apparent. Gallbladder wall is not appreciably thickened. There is no biliary duct dilatation. Pancreas: There is no pancreatic mass or inflammatory focus. Spleen: No splenic lesions are evident. Adrenals/Urinary Tract: Adrenals bilaterally appear unremarkable. There is a cyst in the upper pole of the left kidney measuring 1.2 x 1.2 cm. There is no hydronephrosis on either side. There is a 4 x 4 mm calculus in the mid left kidney. No ureteral calculi are evident on either side. Urinary bladder is midline with wall thickness within normal limits. Stomach/Bowel: There is no appreciable bowel wall or mesenteric thickening. There are occasional sigmoid diverticula without diverticulitis. Colon is largely collapsed. There is no bowel obstruction. No free air or portal  venous air. Vascular/Lymphatic: There is no abdominal aortic aneurysm. No vascular lesions are evident. There is no appreciable adenopathy in the abdomen or pelvis. Reproductive: Uterus is anteverted. There is no evident pelvic mass or pelvic fluid collection. Other: The appendix appears normal. There is no ascites or abscess in the abdomen or pelvis. There is a minimal ventral hernia containing only fat. Musculoskeletal: There is lumbar dextroscoliosis. There are no blastic or lytic bone lesions. There is no intramuscular or abdominal wall lesion. IMPRESSION: 4 x 4 mm calculus in the left kidney, nonobstructing. No hydronephrosis. No ureteral calculus on either side. Occasional sigmoid diverticula without diverticulitis. No bowel obstruction.  No abscess. Appendix appears normal. Small hiatal hernia present. Minimal ventral hernia containing only fat. Electronically Signed   By: Lowella Grip III M.D.   On: 10/11/2016 16:43   US Renal  Result Date: 10/10/2016 CLINICAL DATA:  Left flank pain EXAM: RENAL ULTRASOUND COMPARISON:  CT abdomen and pelvis July 22, 2016 FINDINGS: Right Kidney: Length: 11.7 cm. Echogenicity and renal cortical thickness are within normal limits. No mass, perinephric fluid, or hydronephrosis visualized. No sonographically demonstrable calculus or ureterectasis. Left Kidney: Length: 10.7 cm. Echogenicity and renal cortical thickness are within normal limits. No mass, perinephric fluid, or hydronephrosis visualized. No sonographically demonstrable calculus or ureterectasis. Bladder: Empty and cannot be assessed by ultrasound at this time. IMPRESSION: Normal appearing kidneys bilaterally. Electronically Signed   By: Lowella Grip III M.D.   On: 10/10/2016 10:39    Subjective: No complaints at present  Discharge Exam: Vitals:   10/12/16 0459 10/12/16 1338  BP: 111/61 118/71  Pulse: 60 (!) 58  Resp: 16 16  Temp: 98.6 F (37 C) 98.4 F (36.9 C)   Vitals:   10/11/16 1808 10/11/16 1940 10/12/16 0459 10/12/16 1338  BP: (!) 163/110 (!) 161/99 111/61 118/71  Pulse: 82 61 60 (!) 58  Resp: 18 18 16 16   Temp: 98 F (36.7 C) 97.8 F (36.6 C) 98.6 F (37 C) 98.4 F (36.9 C)  TempSrc: Oral Oral Oral Oral  SpO2: 100% 100% 98% 96%    General: Pt is alert, awake, not in acute distress Cardiovascular: RRR, S1/S2 +, no rubs, no gallops Respiratory: CTA bilaterally, no wheezing, no rhonchi Abdominal: Soft, NT, ND, bowel sounds + Extremities: no edema, no cyanosis   The results of significant diagnostics from this hospitalization (including imaging, microbiology, ancillary and laboratory) are listed below for reference.     Microbiology: Recent Results (from the past 240 hour(s))  Urine culture      Status: Abnormal   Collection Time: 10/10/16  9:53 AM  Result Value Ref Range Status   Specimen Description URINE, RANDOM  Final   Special Requests NONE  Final   Culture MULTIPLE SPECIES PRESENT, SUGGEST RECOLLECTION (A)  Final   Report Status 10/11/2016 FINAL  Final     Labs: BNP (last 3 results) No results for input(s): BNP in the last 8760 hours. Basic Metabolic Panel:  Recent Labs Lab 10/10/16 1009 10/11/16 1248 10/12/16 0509  NA 139 135 136  K 3.3* 3.1* 3.5  CL 103 101 107  CO2 19* 20* 25  GLUCOSE 198* 143* 87  BUN 15 15 11   CREATININE 0.98 0.88 0.67  CALCIUM 9.7 9.4 8.3*   Liver Function Tests:  Recent Labs Lab 10/10/16 1009 10/11/16 1248  AST 40 41  ALT 43 43  ALKPHOS 65 67  BILITOT 0.9 1.2  PROT 7.8 7.7  ALBUMIN 4.8 4.8  Recent Labs Lab 10/10/16 1009 10/11/16 1248  LIPASE 15 18   No results for input(s): AMMONIA in the last 168 hours. CBC:  Recent Labs Lab 10/10/16 1009 10/11/16 1248 10/12/16 0509  WBC 28.0* 18.4* 11.7*  NEUTROABS 23.5*  --   --   HGB 13.0 12.2 10.0*  HCT 38.1 36.8 30.4*  MCV 86.2 87.4 88.6  PLT 402* 386 275   Cardiac Enzymes: No results for input(s): CKTOTAL, CKMB, CKMBINDEX, TROPONINI in the last 168 hours. BNP: Invalid input(s): POCBNP CBG: No results for input(s): GLUCAP in the last 168 hours. D-Dimer No results for input(s): DDIMER in the last 72 hours. Hgb A1c No results for input(s): HGBA1C in the last 72 hours. Lipid Profile No results for input(s): CHOL, HDL, LDLCALC, TRIG, CHOLHDL, LDLDIRECT in the last 72 hours. Thyroid function studies No results for input(s): TSH, T4TOTAL, T3FREE, THYROIDAB in the last 72 hours.  Invalid input(s): FREET3 Anemia work up No results for input(s): VITAMINB12, FOLATE, FERRITIN, TIBC, IRON, RETICCTPCT in the last 72 hours. Urinalysis    Component Value Date/Time   COLORURINE RED (A) 10/11/2016 1201   APPEARANCEUR CLOUDY (A) 10/11/2016 1201   LABSPEC 1.036 (H)  10/11/2016 1201   PHURINE 7.5 10/11/2016 1201   GLUCOSEU NEGATIVE 10/11/2016 1201   HGBUR LARGE (A) 10/11/2016 1201   BILIRUBINUR SMALL (A) 10/11/2016 1201   KETONESUR >80 (A) 10/11/2016 1201   PROTEINUR 100 (A) 10/11/2016 1201   UROBILINOGEN 0.2 07/02/2015 1757   NITRITE NEGATIVE 10/11/2016 1201   LEUKOCYTESUR MODERATE (A) 10/11/2016 1201   Sepsis Labs Invalid input(s): PROCALCITONIN,  WBC,  LACTICIDVEN Microbiology Recent Results (from the past 240 hour(s))  Urine culture     Status: Abnormal   Collection Time: 10/10/16  9:53 AM  Result Value Ref Range Status   Specimen Description URINE, RANDOM  Final   Special Requests NONE  Final   Culture MULTIPLE SPECIES PRESENT, SUGGEST RECOLLECTION (A)  Final   Report Status 10/11/2016 FINAL  Final     SIGNED:   Donne Hazel, MD  [facility] 10/12/2016, 5:50 PM  If 7PM-7AM, please contact night-coverage [email or web address] Password [email or web address]

## 2016-10-13 LAB — URINE CULTURE

## 2016-10-13 LAB — HEMOGLOBIN A1C
Hgb A1c MFr Bld: 5.5 % (ref 4.8–5.6)
MEAN PLASMA GLUCOSE: 111 mg/dL

## 2016-10-16 ENCOUNTER — Emergency Department (HOSPITAL_COMMUNITY)
Admission: EM | Admit: 2016-10-16 | Discharge: 2016-10-16 | Disposition: A | Discharge status: Home / Self Care | Payer: Self-pay | Attending: Emergency Medicine | Admitting: Emergency Medicine

## 2016-10-16 ENCOUNTER — Emergency Department (HOSPITAL_COMMUNITY): Payer: Self-pay

## 2016-10-16 ENCOUNTER — Encounter (HOSPITAL_COMMUNITY): Payer: Self-pay | Admitting: *Deleted

## 2016-10-16 DIAGNOSIS — F17210 Nicotine dependence, cigarettes, uncomplicated: Secondary | ICD-10-CM | POA: Insufficient documentation

## 2016-10-16 DIAGNOSIS — Z79899 Other long term (current) drug therapy: Secondary | ICD-10-CM | POA: Insufficient documentation

## 2016-10-16 DIAGNOSIS — R109 Unspecified abdominal pain: Secondary | ICD-10-CM | POA: Insufficient documentation

## 2016-10-16 DIAGNOSIS — N200 Calculus of kidney: Secondary | ICD-10-CM | POA: Insufficient documentation

## 2016-10-16 LAB — COMPREHENSIVE METABOLIC PANEL
ALT: 34 U/L (ref 14–54)
AST: 33 U/L (ref 15–41)
Albumin: 4.4 g/dL (ref 3.5–5.0)
Alkaline Phosphatase: 58 U/L (ref 38–126)
Anion gap: 14 (ref 5–15)
BUN: 10 mg/dL (ref 6–20)
CHLORIDE: 105 mmol/L (ref 101–111)
CO2: 18 mmol/L — AB (ref 22–32)
CREATININE: 0.87 mg/dL (ref 0.44–1.00)
Calcium: 10.1 mg/dL (ref 8.9–10.3)
Glucose, Bld: 140 mg/dL — ABNORMAL HIGH (ref 65–99)
Potassium: 3.9 mmol/L (ref 3.5–5.1)
Sodium: 137 mmol/L (ref 135–145)
Total Bilirubin: 0.3 mg/dL (ref 0.3–1.2)
Total Protein: 6.9 g/dL (ref 6.5–8.1)

## 2016-10-16 LAB — URINALYSIS, ROUTINE W REFLEX MICROSCOPIC
Bilirubin Urine: NEGATIVE
GLUCOSE, UA: NEGATIVE mg/dL
Ketones, ur: 15 mg/dL — AB
LEUKOCYTES UA: NEGATIVE
Nitrite: NEGATIVE
PH: 8.5 — AB (ref 5.0–8.0)
Protein, ur: 100 mg/dL — AB
SPECIFIC GRAVITY, URINE: 1.023 (ref 1.005–1.030)

## 2016-10-16 LAB — LIPASE, BLOOD: LIPASE: 23 U/L (ref 11–51)

## 2016-10-16 LAB — URINE MICROSCOPIC-ADD ON

## 2016-10-16 LAB — CBC
HEMATOCRIT: 42.1 % (ref 36.0–46.0)
Hemoglobin: 13.8 g/dL (ref 12.0–15.0)
MCH: 29 pg (ref 26.0–34.0)
MCHC: 32.8 g/dL (ref 30.0–36.0)
MCV: 88.4 fL (ref 78.0–100.0)
Platelets: 390 10*3/uL (ref 150–400)
RBC: 4.76 MIL/uL (ref 3.87–5.11)
RDW: 14.7 % (ref 11.5–15.5)
WBC: 20.4 10*3/uL — AB (ref 4.0–10.5)

## 2016-10-16 MED ORDER — PROMETHAZINE HCL 25 MG/ML IJ SOLN
25.0000 mg | Freq: Once | INTRAMUSCULAR | Status: AC
  Administered 2016-10-16: 25 mg via INTRAVENOUS
  Filled 2016-10-16: qty 1

## 2016-10-16 MED ORDER — ONDANSETRON 4 MG PO TBDP
4.0000 mg | ORAL_TABLET | Freq: Once | ORAL | Status: DC | PRN
  Filled 2016-10-16: qty 1

## 2016-10-16 MED ORDER — HALOPERIDOL LACTATE 5 MG/ML IJ SOLN
2.0000 mg | Freq: Once | INTRAMUSCULAR | Status: AC
  Administered 2016-10-16: 2 mg via INTRAVENOUS
  Filled 2016-10-16: qty 1

## 2016-10-16 MED ORDER — ONDANSETRON 4 MG PO TBDP
ORAL_TABLET | ORAL | Status: AC
  Filled 2016-10-16: qty 1

## 2016-10-16 MED ORDER — NAPROXEN 500 MG PO TABS: 500.0000 mg | ORAL_TABLET | Freq: Two times a day (BID) | ORAL | 0 refills | Status: DC

## 2016-10-16 MED ORDER — KETOROLAC TROMETHAMINE 30 MG/ML IJ SOLN
30.0000 mg | Freq: Once | INTRAMUSCULAR | Status: AC
  Administered 2016-10-16: 30 mg via INTRAVENOUS
  Filled 2016-10-16: qty 1

## 2016-10-16 MED ORDER — PROMETHAZINE HCL 25 MG PO TABS: 25.0000 mg | ORAL_TABLET | Freq: Four times a day (QID) | ORAL | 0 refills | Status: DC | PRN

## 2016-10-16 NOTE — Discharge Instructions (Signed)
Read the information below.  Use the prescribed medication as directed.  Please discuss all new medications with your pharmacist.  You may return to the Emergency Department at any time for worsening condition or any new symptoms that concern you.   If you develop high fevers, worsening abdominal pain, uncontrolled vomiting, or are unable to tolerate fluids by mouth, return to the ER for a recheck.  ° °

## 2016-10-16 NOTE — ED Notes (Signed)
Pt was very rude and said she felt the same as she did when she got here. She said she did not want me to check her blood pressure she just wanted to get out of here.

## 2016-10-16 NOTE — ED Notes (Signed)
Pt transported to US

## 2016-10-16 NOTE — ED Triage Notes (Signed)
Pt reports abdominal pain and recent kidney stone. Pt reports N/V/D as well

## 2016-10-16 NOTE — ED Notes (Signed)
Pt moaning loudly and crying to the point that she is gagging. Raquel Sarna, [place] notified. Pt states she came here via POV and didn't take her daily meds or call her GI doctor. Pt continues to yell, "I need help"!

## 2016-10-16 NOTE — ED Notes (Signed)
Pt continuing to come up to Registration and stating that she is in pain. Pt given a blanket.

## 2016-10-16 NOTE — ED Notes (Signed)
Pt unable to stay still, due to pain. Unable to get a full tube for the CBC. Informed Nikki - RN.

## 2016-10-16 NOTE — ED Notes (Signed)
Hooked patient up to the monitor patient waiting on provider

## 2016-10-16 NOTE — ED Provider Notes (Signed)
[facility] DEPT Provider Note   CSN: [id number] Arrival date & time: 10/16/16  1107     History   Chief Complaint Chief Complaint  Patient presents with  . Abdominal Pain    HPI Jeanne Mcneil is a 30 y.o. female.  HPI   Pt with hx chronic abdominal pain, kidney stones, GERD with recent admission for pyelonephritis p/w left flank pain, N/V/D, dysuria, urinary frequency and urgency that began at 5am today.  States this is similar to the pain she was admitted with earlier this month.  States she feels like she is passing a kidney stone.    Past Medical History:  Diagnosis Date  . Anxiety   . Back pain   . Constipation   . Depression   . GERD (gastroesophageal reflux disease)   . Glaucoma of both eyes   . Hematuria   . History of abnormal cervical Pap smear   . History of chronic gastritis   . History of kidney stones   . Hydronephrosis, right   . Renal calculi    bilateral per ct 0902-2017  . Right ureteral stone   . Urgency of urination   . Uterine fibroid     Patient Active Problem List   Diagnosis Date Noted  . Pyelonephritis 10/11/2016  . Hypokalemia 10/11/2016  . Hyperglycemia 10/11/2016  . Elevated blood pressure reading 10/11/2016  . Left nephrolithiasis 10/11/2016  . Renal colic on left side 42/59/5638  . Generalized anxiety disorder 10/11/2016  . Nausea with vomiting 07/26/2016  . Ureteral stone 07/04/2016  . UTI (urinary tract infection) 07/04/2016  . Hydronephrosis of right kidney 07/04/2016  . GERD (gastroesophageal reflux disease) 07/04/2016  . Ureteral calculi 07/04/2016  . Abdominal pain, chronic, epigastric   . Hematemesis 05/16/2016  . Heme + stool 05/16/2016  . Abdominal pain, epigastric 05/16/2016    Past Surgical History:  Procedure Laterality Date  . CYSTOSCOPY W/ URETERAL STENT PLACEMENT Right 07/04/2016   Procedure: CYSTOSCOPY WITH RIGHT RETROGRADE PYELOGRAM, RIGHT URETERAL STENT PLACEMENT;  Surgeon: Ardis Hughs, MD;   Location: [facility];  Service: Urology;  Laterality: Right;  . CYSTOSCOPY WITH RETROGRADE PYELOGRAM, URETEROSCOPY AND STENT PLACEMENT Left 11/28/2007  . CYSTOSCOPY WITH RETROGRADE PYELOGRAM, URETEROSCOPY AND STENT PLACEMENT Right 08/02/2016   Procedure: CYSTOSCOPY WITH RIGHT  RETROGRADE PYELOGRAM, URETEROSCOPY,  AND STENT EXCHANGE;  Surgeon: Ardis Hughs, MD;  Location: [facility];  Service: Urology;  Laterality: Right;  . ESOPHAGOGASTRODUODENOSCOPY N/A 05/17/2016   Procedure: ESOPHAGOGASTRODUODENOSCOPY (EGD);  Surgeon: Danie Binder, MD;  Location: [facility];  Service: Endoscopy;  Laterality: N/A;  830   . INDUCED ABORTION  2009    OB History    Gravida Para Term Preterm AB Living   3 2 2   1 2    SAB TAB Ectopic Multiple Live Births   1       2       Home Medications    Prior to Admission medications   Medication Sig Start Date End Date Taking? Authorizing Provider  acetaminophen (TYLENOL) 500 MG tablet Take 1,000 mg by mouth every 6 (six) hours as needed.   Yes Historical Provider, MD  busPIRone (BUSPAR) 5 MG tablet Take 5 mg by mouth 2 (two) times daily.   Yes Historical Provider, MD  ciprofloxacin (CIPRO) 500 MG tablet Take 1 tablet (500 mg total) by mouth 2 (two) times daily. 10/12/16  Yes Donne Hazel, MD  diphenhydramine-acetaminophen (TYLENOL PM) 25-500 MG TABS tablet Take  1 tablet by mouth at bedtime as needed.   Yes Historical Provider, MD  ondansetron (ZOFRAN ODT) 4 MG disintegrating tablet Take 1 tablet (4 mg total) by mouth every 8 (eight) hours as needed for nausea or vomiting. 08/26/16  Yes Nona Dell, PA-C  oxyCODONE-acetaminophen (PERCOCET/ROXICET) 5-325 MG tablet Take 2 tablets by mouth every 4 (four) hours as needed for severe pain. 10/10/16  Yes Lacretia Leigh, MD  phenazopyridine (PYRIDIUM) 200 MG tablet Take 1 tablet (200 mg total) by mouth 3 (three) times daily as needed for pain. 08/02/16  Yes Ardis Hughs, MD  sertraline  (ZOLOFT) 50 MG tablet Take 50 mg by mouth every morning.    Yes Historical Provider, MD  dexlansoprazole (DEXILANT) 60 MG capsule Take 1 capsule (60 mg total) by mouth daily. Patient not taking: Reported on 10/16/2016 07/26/16   Carlis Stable, NP  dicyclomine (BENTYL) 20 MG tablet Take 1 tablet (20 mg total) by mouth every 6 (six) hours as needed for spasms (abdominal cramping). Patient not taking: Reported on 10/16/2016 07/22/16   Francine Graven, DO  ketorolac (TORADOL) 10 MG tablet Take 1 tablet (10 mg total) by mouth every 6 (six) hours as needed. Patient not taking: Reported on 10/16/2016 10/12/16   Donne Hazel, MD  naproxen (NAPROSYN) 500 MG tablet Take 1 tablet (500 mg total) by mouth 2 (two) times daily. 10/16/16   Clayton Bibles, PA-C  oxybutynin (DITROPAN) 5 MG tablet Take 1 tablet (5 mg total) by mouth 3 (three) times daily. Patient not taking: Reported on 10/16/2016 07/16/16   Olivia Canter Sam, PA-C  promethazine (PHENERGAN) 25 MG tablet Take 1 tablet (25 mg total) by mouth every 6 (six) hours as needed for nausea or vomiting. 10/16/16   Clayton Bibles, PA-C    Family History Family History  Problem Relation Age of Onset  . Colon cancer Paternal Uncle     Passed away 01/20/2015  . Nephrolithiasis Father   . Hypertension Father   . Nephrolithiasis Sister   . Nephrolithiasis Sister   . Nephrolithiasis Sister   . Hypertension Mother     Social History Social History  Substance Use Topics  . Smoking status: Current Every Day Smoker    Packs/day: 0.50    Years: 20.00    Types: Cigarettes  . Smokeless tobacco: Never Used  . Alcohol use 2.4 oz/week    4 Shots of liquor per week     Comment: No ETOH in 4 months; Previously: occasionally, once every 2-3 months.     Allergies   Keflex [cephalexin]; Codeine; Tape; and Vicodin [hydrocodone-acetaminophen]   Review of Systems Review of Systems  All other systems reviewed and are negative.    Physical Exam Updated Vital Signs BP 137/74    Pulse 81   Temp 98.2 F (36.8 C) (Oral)   Resp 16   SpO2 98%   Physical Exam  Constitutional: She appears well-developed and well-nourished. No distress.  HENT:  Head: Normocephalic and atraumatic.  Neck: Neck supple.  Cardiovascular: Normal rate and regular rhythm.   Pulmonary/Chest: Effort normal and breath sounds normal. No respiratory distress. She has no wheezes. She has no rales.  Abdominal: Soft. She exhibits no distension. There is no tenderness. There is no rebound and no guarding.  Mild diffuse tenderness of left flank.  +CVA tenderness on left  Neurological: She is alert.  Skin: She is not diaphoretic.  Nursing note and vitals reviewed.    ED Treatments / Results  Labs (  all labs ordered are listed, but only abnormal results are displayed) Labs Reviewed  COMPREHENSIVE METABOLIC PANEL - Abnormal; Notable for the following:       Result Value   CO2 18 (*)    Glucose, Bld 140 (*)    All other components within normal limits  CBC - Abnormal; Notable for the following:    WBC 20.4 (*)    All other components within normal limits  URINALYSIS, ROUTINE W REFLEX MICROSCOPIC (NOT AT [facility]) - Abnormal; Notable for the following:    APPearance CLOUDY (*)    pH 8.5 (*)    Hgb urine dipstick MODERATE (*)    Ketones, ur 15 (*)    Protein, ur 100 (*)    All other components within normal limits  URINE MICROSCOPIC-ADD ON - Abnormal; Notable for the following:    Squamous Epithelial / LPF 6-30 (*)    Bacteria, UA FEW (*)    All other components within normal limits  URINE CULTURE  LIPASE, BLOOD    EKG  EKG Interpretation None       Radiology US Renal  Result Date: 10/16/2016 CLINICAL DATA:  LEFT flank pain. EXAM: RENAL / URINARY TRACT ULTRASOUND COMPLETE COMPARISON:  CT abdomen and pelvis October 11, 2016 FINDINGS: Right Kidney: Length: 12.4 cm. Echogenicity within normal limits. No mass or hydronephrosis visualized. Left Kidney: Length: 11.6 cm. Echogenicity within  normal limits. No mass or hydronephrosis visualized. 7 mm echogenic upper pole nephrolithiasis. 10 mm anechoic LEFT upper pole simple cyst. Bladder: Appears normal for degree of bladder distention. IMPRESSION: 7 mm nonobstructing LEFT nephrolithiasis. Electronically Signed   By: Elon Alas M.D.   On: 10/16/2016 17:28    Procedures Procedures (including critical care time)  Medications Ordered in ED Medications  ondansetron (ZOFRAN-ODT) disintegrating tablet 4 mg (4 mg Oral Not Given 10/16/16 1544)  ketorolac (TORADOL) 30 MG/ML injection 30 mg (30 mg Intravenous Given 10/16/16 1501)  promethazine (PHENERGAN) injection 25 mg (25 mg Intravenous Given 10/16/16 1501)  haloperidol lactate (HALDOL) injection 2 mg (2 mg Intravenous Given 10/16/16 1746)     Initial Impression / Assessment and Plan / ED Course  I have reviewed the triage vital signs and the nursing notes.  Pertinent labs & imaging results that were available during my care of the patient were reviewed by me and considered in my medical decision making (see chart for details).  Clinical Course    Afebrile nontoxic patient with left flank pain, N/V/D, urinary symptoms.  Pt has had multiple ED visits and 2 admissions over the past 6 months, frequent abdominal pain complaints.  Labs remarkable for leukocytosis.  UA does not appear infected, sent for culture.  US renal demonstrates kidney but not ureteral stone, as previous CT.  Pt got relief with medications in ED and tolerated PO.  Pt does have history of making herself throw up, will send home with phenergan as this provided significant relief in ED.  D/C home with PCP and urology follow up.  Discussed result, findings, treatment, and follow up  with patient.  Pt given return precautions.  Pt verbalizes understanding and agrees with plan.       Final Clinical Impressions(s) / ED Diagnoses   Final diagnoses:  Acute left flank pain    New Prescriptions New Prescriptions    NAPROXEN (NAPROSYN) 500 MG TABLET    Take 1 tablet (500 mg total) by mouth 2 (two) times daily.   PROMETHAZINE (PHENERGAN) 25 MG TABLET  Take 1 tablet (25 mg total) by mouth every 6 (six) hours as needed for nausea or vomiting.     Clayton Bibles, PA-C 10/16/16 1959    Merrily Pew, MD 10/17/16 [phone]

## 2016-10-17 LAB — URINE CULTURE: Culture: <10000 — AB

## 2016-10-18 ENCOUNTER — Emergency Department (HOSPITAL_COMMUNITY)
Admission: EM | Admit: 2016-10-18 | Discharge: 2016-10-18 | Disposition: A | Discharge status: Home / Self Care | Payer: Medicaid Other | Attending: Emergency Medicine | Admitting: Emergency Medicine

## 2016-10-18 ENCOUNTER — Emergency Department (HOSPITAL_COMMUNITY): Payer: Medicaid Other

## 2016-10-18 ENCOUNTER — Encounter (HOSPITAL_COMMUNITY): Payer: Self-pay

## 2016-10-18 DIAGNOSIS — Z79899 Other long term (current) drug therapy: Secondary | ICD-10-CM | POA: Insufficient documentation

## 2016-10-18 DIAGNOSIS — N390 Urinary tract infection, site not specified: Secondary | ICD-10-CM | POA: Insufficient documentation

## 2016-10-18 DIAGNOSIS — K2950 Unspecified chronic gastritis without bleeding: Secondary | ICD-10-CM

## 2016-10-18 DIAGNOSIS — F17210 Nicotine dependence, cigarettes, uncomplicated: Secondary | ICD-10-CM | POA: Insufficient documentation

## 2016-10-18 LAB — CBC
HEMATOCRIT: 41.1 % (ref 36.0–46.0)
HEMOGLOBIN: 14.1 g/dL (ref 12.0–15.0)
MCH: 29.3 pg (ref 26.0–34.0)
MCHC: 34.3 g/dL (ref 30.0–36.0)
MCV: 85.3 fL (ref 78.0–100.0)
Platelets: 483 10*3/uL — ABNORMAL HIGH (ref 150–400)
RBC: 4.82 MIL/uL (ref 3.87–5.11)
RDW: 14.6 % (ref 11.5–15.5)
WBC: 23.3 10*3/uL — AB (ref 4.0–10.5)

## 2016-10-18 LAB — URINE MICROSCOPIC-ADD ON

## 2016-10-18 LAB — DIFFERENTIAL
BASOS ABS: 0 10*3/uL (ref 0.0–0.1)
Basophils Relative: 0 %
EOS ABS: 0 10*3/uL (ref 0.0–0.7)
Eosinophils Relative: 0 %
LYMPHS PCT: 16 %
Lymphs Abs: 3.7 10*3/uL (ref 0.7–4.0)
Monocytes Absolute: 2.3 10*3/uL — ABNORMAL HIGH (ref 0.1–1.0)
Monocytes Relative: 10 %
NEUTROS ABS: 17.3 10*3/uL — AB (ref 1.7–7.7)
Neutrophils Relative %: 74 %

## 2016-10-18 LAB — COMPREHENSIVE METABOLIC PANEL
ALT: 54 U/L (ref 14–54)
ANION GAP: 17 — AB (ref 5–15)
AST: 38 U/L (ref 15–41)
Albumin: 5.5 g/dL — ABNORMAL HIGH (ref 3.5–5.0)
Alkaline Phosphatase: 68 U/L (ref 38–126)
BILIRUBIN TOTAL: 1 mg/dL (ref 0.3–1.2)
BUN: 28 mg/dL — ABNORMAL HIGH (ref 6–20)
CHLORIDE: 95 mmol/L — AB (ref 101–111)
CO2: 22 mmol/L (ref 22–32)
Calcium: 9.8 mg/dL (ref 8.9–10.3)
Creatinine, Ser: 1.4 mg/dL — ABNORMAL HIGH (ref 0.44–1.00)
GFR, EST AFRICAN AMERICAN: 57 mL/min — AB (ref >60)
GFR, EST NON AFRICAN AMERICAN: 49 mL/min — AB (ref >60)
Glucose, Bld: 145 mg/dL — ABNORMAL HIGH (ref 65–99)
POTASSIUM: 3.5 mmol/L (ref 3.5–5.1)
Sodium: 134 mmol/L — ABNORMAL LOW (ref 135–145)
TOTAL PROTEIN: 8.5 g/dL — AB (ref 6.5–8.1)

## 2016-10-18 LAB — URINALYSIS, ROUTINE W REFLEX MICROSCOPIC
GLUCOSE, UA: 100 mg/dL — AB
Ketones, ur: NEGATIVE mg/dL
LEUKOCYTES UA: NEGATIVE
NITRITE: NEGATIVE
PH: 5.5 (ref 5.0–8.0)
PROTEIN: 100 mg/dL — AB
Specific Gravity, Urine: 1.03 (ref 1.005–1.030)

## 2016-10-18 LAB — I-STAT TROPONIN, ED: Troponin i, poc: 0.01 ng/mL (ref 0.00–0.08)

## 2016-10-18 LAB — LIPASE, BLOOD: LIPASE: 15 U/L (ref 11–51)

## 2016-10-18 LAB — POC URINE PREG, ED: PREG TEST UR: NEGATIVE

## 2016-10-18 MED ORDER — METOCLOPRAMIDE HCL 10 MG PO TABS: 10.0000 mg | ORAL_TABLET | Freq: Four times a day (QID) | ORAL | 0 refills | Status: DC

## 2016-10-18 MED ORDER — SULFAMETHOXAZOLE-TRIMETHOPRIM 800-160 MG PO TABS: 1.0000 | ORAL_TABLET | Freq: Two times a day (BID) | ORAL | 0 refills | Status: DC

## 2016-10-18 MED ORDER — SODIUM CHLORIDE 0.9 % IV BOLUS (SEPSIS)
1000.0000 mL | Freq: Once | INTRAVENOUS | Status: AC
  Administered 2016-10-18: 1000 mL via INTRAVENOUS

## 2016-10-18 MED ORDER — PROMETHAZINE HCL 25 MG PO TABS: 25.0000 mg | ORAL_TABLET | Freq: Four times a day (QID) | ORAL | 0 refills | Status: DC | PRN

## 2016-10-18 MED ORDER — ONDANSETRON HCL 4 MG/2ML IJ SOLN
4.0000 mg | Freq: Once | INTRAMUSCULAR | Status: AC | PRN
  Administered 2016-10-18: 4 mg via INTRAVENOUS
  Filled 2016-10-18: qty 2

## 2016-10-18 MED ORDER — ONDANSETRON 8 MG PO TBDP: 8.0000 mg | ORAL_TABLET | Freq: Three times a day (TID) | ORAL | 0 refills | Status: DC | PRN

## 2016-10-18 MED ORDER — HALOPERIDOL LACTATE 5 MG/ML IJ SOLN
5.0000 mg | Freq: Once | INTRAMUSCULAR | Status: AC
  Administered 2016-10-18: 5 mg via INTRAVENOUS
  Filled 2016-10-18: qty 1

## 2016-10-18 MED ORDER — PROMETHAZINE HCL 25 MG RE SUPP: 25.0000 mg | Freq: Four times a day (QID) | RECTAL | 0 refills | Status: DC | PRN

## 2016-10-18 MED ORDER — DIPHENHYDRAMINE HCL 50 MG/ML IJ SOLN
25.0000 mg | Freq: Once | INTRAMUSCULAR | Status: AC
  Administered 2016-10-18: 25 mg via INTRAVENOUS
  Filled 2016-10-18: qty 1

## 2016-10-18 MED ORDER — GI COCKTAIL ~~LOC~~
30.0000 mL | Freq: Once | ORAL | Status: AC
  Administered 2016-10-18: 30 mL via ORAL
  Filled 2016-10-18: qty 30

## 2016-10-18 MED ORDER — FENTANYL CITRATE (PF) 100 MCG/2ML IJ SOLN
50.0000 ug | Freq: Once | INTRAMUSCULAR | Status: AC
  Administered 2016-10-18: 50 ug via INTRAVENOUS
  Filled 2016-10-18: qty 2

## 2016-10-18 NOTE — ED Notes (Signed)
Bed: FR10 Expected date:  Expected time:  Means of arrival:  Comments: 30 yr old chest pain, hx of kidney stone/anxiety

## 2016-10-18 NOTE — ED Notes (Signed)
ED Provider at bedside. 

## 2016-10-18 NOTE — ED Provider Notes (Addendum)
[facility] DEPT Provider Note   CSN: [id number] Arrival date & time: 10/18/16  8016     History   Chief Complaint Chief Complaint  Patient presents with  . Chest Pain  . Abdominal Pain  . Nausea    HPI Jeanne Mcneil is a 30 y.o. female.  HPI Pt comes in with cc of n/v/abd pain/chest pain/dib. Reports that 3 am she started having severe, sudden L sided chest pain, and epigastric pain. Pt has had n/v. Emesis x 5. Pt thinks there is blood in her emesis. Pt also is having some urinary urgency. There has been recent antibiotics for pyelonephritis. Pt has had hx of renal stones. She reports with renal stones she does get urinary symptoms and back pain. She denies any fevers. Pt's urine cultures from recent admission were normal. Pt also having chest pain, L sided, sharp, constant. Pain is not worse with inspiration or with palpation or movement. Pt has no hx of chest pain. No new cough. Pt has no hx of PE, DVT and denies any exogenous estrogen use, long distance travels or surgery in the past 6 weeks, active cancer, recent immobilization.  Pt has hx of chronic abd pain. She reports that her current pain is similar. The pain is associated with nausea and emesis. EGD from the recent past reviewed.   Past Medical History:  Diagnosis Date  . Anxiety   . Back pain   . Constipation   . Depression   . GERD (gastroesophageal reflux disease)   . Glaucoma of both eyes   . Hematuria   . History of abnormal cervical Pap smear   . History of chronic gastritis   . History of kidney stones   . Hydronephrosis, right   . Renal calculi    bilateral per ct 0902-2017  . Right ureteral stone   . Urgency of urination   . Uterine fibroid     Patient Active Problem List   Diagnosis Date Noted  . Pyelonephritis 10/11/2016  . Hypokalemia 10/11/2016  . Hyperglycemia 10/11/2016  . Elevated blood pressure reading 10/11/2016  . Left nephrolithiasis 10/11/2016  . Renal colic on left side  55/37/4827  . Generalized anxiety disorder 10/11/2016  . Nausea with vomiting 07/26/2016  . Ureteral stone 07/04/2016  . UTI (urinary tract infection) 07/04/2016  . Hydronephrosis of right kidney 07/04/2016  . GERD (gastroesophageal reflux disease) 07/04/2016  . Ureteral calculi 07/04/2016  . Abdominal pain, chronic, epigastric   . Hematemesis 05/16/2016  . Heme + stool 05/16/2016  . Abdominal pain, epigastric 05/16/2016    Past Surgical History:  Procedure Laterality Date  . CYSTOSCOPY W/ URETERAL STENT PLACEMENT Right 07/04/2016   Procedure: CYSTOSCOPY WITH RIGHT RETROGRADE PYELOGRAM, RIGHT URETERAL STENT PLACEMENT;  Surgeon: Ardis Hughs, MD;  Location: [facility];  Service: Urology;  Laterality: Right;  . CYSTOSCOPY WITH RETROGRADE PYELOGRAM, URETEROSCOPY AND STENT PLACEMENT Left 11/28/2007  . CYSTOSCOPY WITH RETROGRADE PYELOGRAM, URETEROSCOPY AND STENT PLACEMENT Right 08/02/2016   Procedure: CYSTOSCOPY WITH RIGHT  RETROGRADE PYELOGRAM, URETEROSCOPY,  AND STENT EXCHANGE;  Surgeon: Ardis Hughs, MD;  Location: [facility];  Service: Urology;  Laterality: Right;  . ESOPHAGOGASTRODUODENOSCOPY N/A 05/17/2016   Procedure: ESOPHAGOGASTRODUODENOSCOPY (EGD);  Surgeon: Danie Binder, MD;  Location: [facility];  Service: Endoscopy;  Laterality: N/A;  830   . INDUCED ABORTION  2009    OB History    Gravida Para Term Preterm AB Living   3 2 2   1  2  SAB TAB Ectopic Multiple Live Births   1       2       Home Medications    Prior to Admission medications   Medication Sig Start Date End Date Taking? Authorizing Provider  acetaminophen (TYLENOL) 500 MG tablet Take 1,000 mg by mouth every 6 (six) hours as needed.   Yes Historical Provider, MD  busPIRone (BUSPAR) 5 MG tablet Take 5 mg by mouth 2 (two) times daily.   Yes Historical Provider, MD  dicyclomine (BENTYL) 20 MG tablet Take 1 tablet (20 mg total) by mouth every 6 (six) hours as needed for spasms  (abdominal cramping). 07/22/16  Yes Francine Graven, DO  diphenhydramine-acetaminophen (TYLENOL PM) 25-500 MG TABS tablet Take 1 tablet by mouth at bedtime as needed (sleep/pain).    Yes Historical Provider, MD  sertraline (ZOLOFT) 50 MG tablet Take 50 mg by mouth every morning.    Yes Historical Provider, MD  ciprofloxacin (CIPRO) 500 MG tablet Take 1 tablet (500 mg total) by mouth 2 (two) times daily. Patient not taking: Reported on 10/18/2016 10/12/16   Donne Hazel, MD  dexlansoprazole (DEXILANT) 60 MG capsule Take 1 capsule (60 mg total) by mouth daily. Patient not taking: Reported on 10/18/2016 07/26/16   Carlis Stable, NP  metoCLOPramide (REGLAN) 10 MG tablet Take 1 tablet (10 mg total) by mouth every 6 (six) hours. 10/18/16   Varney Biles, MD  naproxen (NAPROSYN) 500 MG tablet Take 1 tablet (500 mg total) by mouth 2 (two) times daily. Patient not taking: Reported on 10/18/2016 10/16/16   Clayton Bibles, PA-C  ondansetron (ZOFRAN ODT) 8 MG disintegrating tablet Take 1 tablet (8 mg total) by mouth every 8 (eight) hours as needed for nausea. 10/18/16   Varney Biles, MD  promethazine (PHENERGAN) 25 MG suppository Place 1 suppository (25 mg total) rectally every 6 (six) hours as needed for nausea. 10/18/16   Varney Biles, MD  promethazine (PHENERGAN) 25 MG tablet Take 1 tablet (25 mg total) by mouth every 6 (six) hours as needed for nausea. 10/18/16   Varney Biles, MD  sulfamethoxazole-trimethoprim (BACTRIM DS,SEPTRA DS) 800-160 MG tablet Take 1 tablet by mouth 2 (two) times daily. 10/18/16 10/25/16  Varney Biles, MD    Family History Family History  Problem Relation Age of Onset  . Colon cancer Paternal Uncle     Passed away 02-04-15  . Nephrolithiasis Father   . Hypertension Father   . Nephrolithiasis Sister   . Nephrolithiasis Sister   . Nephrolithiasis Sister   . Hypertension Mother     Social History Social History  Substance Use Topics  . Smoking status: Current Every Day  Smoker    Packs/day: 0.50    Years: 20.00    Types: Cigarettes  . Smokeless tobacco: Never Used  . Alcohol use 2.4 oz/week    4 Shots of liquor per week     Comment: No ETOH in 4 months; Previously: occasionally, once every 2-3 months.     Allergies   Keflex [cephalexin]; Codeine; Tape; and Vicodin [hydrocodone-acetaminophen]   Review of Systems Review of Systems  ROS 10 Systems reviewed and are negative for acute change except as noted in the HPI.     Physical Exam Updated Vital Signs BP 122/96   Pulse 82   Temp 97.6 F (36.4 C) (Oral)   Resp 18   Ht 5' 2"  (1.575 m)   Wt 150 lb (68 kg)   LMP 09/17/2016 (Approximate)   SpO2 99%  BMI 27.44 kg/m   Physical Exam  Constitutional: She is oriented to person, place, and time. She appears well-developed.  HENT:  Head: Normocephalic and atraumatic.  Eyes: Conjunctivae and EOM are normal. Pupils are equal, round, and reactive to light.  Neck: Normal range of motion. Neck supple.  Cardiovascular: Regular rhythm and normal heart sounds.   tachycardia  Pulmonary/Chest: Effort normal and breath sounds normal. No respiratory distress.  Abdominal: Soft. Bowel sounds are normal. She exhibits no distension. There is tenderness. There is no rebound and no guarding.  Generalized -worst over the epigastric region  Neurological: She is alert and oriented to person, place, and time.  Skin: Skin is warm and dry. Capillary refill takes 2 to 3 seconds.  Nursing note and vitals reviewed.    ED Treatments / Results  Labs (all labs ordered are listed, but only abnormal results are displayed) Labs Reviewed  COMPREHENSIVE METABOLIC PANEL - Abnormal; Notable for the following:       Result Value   Sodium 134 (*)    Chloride 95 (*)    Glucose, Bld 145 (*)    BUN 28 (*)    Creatinine, Ser 1.40 (*)    Total Protein 8.5 (*)    Albumin 5.5 (*)    GFR calc non Af Amer 49 (*)    GFR calc Af Amer 57 (*)    Anion gap 17 (*)    All  other components within normal limits  CBC - Abnormal; Notable for the following:    WBC 23.3 (*)    Platelets 483 (*)    All other components within normal limits  URINALYSIS, ROUTINE W REFLEX MICROSCOPIC (NOT AT [facility]) - Abnormal; Notable for the following:    APPearance TURBID (*)    Glucose, UA 100 (*)    Hgb urine dipstick LARGE (*)    Bilirubin Urine SMALL (*)    Protein, ur 100 (*)    All other components within normal limits  DIFFERENTIAL - Abnormal; Notable for the following:    Neutro Abs 17.3 (*)    Monocytes Absolute 2.3 (*)    All other components within normal limits  URINE MICROSCOPIC-ADD ON - Abnormal; Notable for the following:    Squamous Epithelial / LPF 6-30 (*)    Bacteria, UA MANY (*)    Casts HYALINE CASTS (*)    All other components within normal limits  URINE CULTURE  LIPASE, BLOOD  I-STAT TROPOININ, ED  POC URINE PREG, ED    EKG  EKG Interpretation  Date/Time:  Wednesday October 18 2016 07:20:42 EST Ventricular Rate:  94 PR Interval:    QRS Duration: 87 QT Interval:  367 QTC Calculation: 459 R Axis:   82 Text Interpretation:  Sinus rhythm normal ekg  No acute changes No old tracing to compare Confirmed by Kathrynn Humble, MD, Thelma Comp [phone]) on 10/18/2016 8:02:48 AM       Radiology Dg Chest 2 View  Result Date: 10/18/2016 CLINICAL DATA:  New onset mid upper chest pain associated with shortness of breath and nausea and vomiting this morning. Current smoker EXAM: CHEST  2 VIEW COMPARISON:  PA and lateral chest x-ray of Mar 23, 2011 FINDINGS: The lungs are mildly hyperinflated but clear. There is no pneumothorax or pleural effusion. The heart and pulmonary vascularity are normal. The mediastinum is normal in width. The bony thorax is unremarkable. IMPRESSION: Mild hyperinflation which may be voluntary or may reflect bronchitic/reactive airway changes. No pneumonia nor other acute cardiopulmonary abnormality.  Electronically Signed   By: David  Martinique M.D.    On: 10/18/2016 07:44   US Renal  Result Date: 10/16/2016 CLINICAL DATA:  LEFT flank pain. EXAM: RENAL / URINARY TRACT ULTRASOUND COMPLETE COMPARISON:  CT abdomen and pelvis October 11, 2016 FINDINGS: Right Kidney: Length: 12.4 cm. Echogenicity within normal limits. No mass or hydronephrosis visualized. Left Kidney: Length: 11.6 cm. Echogenicity within normal limits. No mass or hydronephrosis visualized. 7 mm echogenic upper pole nephrolithiasis. 10 mm anechoic LEFT upper pole simple cyst. Bladder: Appears normal for degree of bladder distention. IMPRESSION: 7 mm nonobstructing LEFT nephrolithiasis. Electronically Signed   By: Elon Alas M.D.   On: 10/16/2016 17:28    Procedures Procedures (including critical care time)  Medications Ordered in ED Medications  ondansetron (ZOFRAN) injection 4 mg (4 mg Intravenous Given 10/18/16 0710)  gi cocktail (Maalox,Lidocaine,Donnatal) (30 mLs Oral Given 10/18/16 0759)  sodium chloride 0.9 % bolus 1,000 mL (0 mLs Intravenous Stopped 10/18/16 0915)  fentaNYL (SUBLIMAZE) injection 50 mcg (50 mcg Intravenous Given 10/18/16 0814)  haloperidol lactate (HALDOL) injection 5 mg (5 mg Intravenous Given 10/18/16 0814)  diphenhydrAMINE (BENADRYL) injection 25 mg (25 mg Intravenous Given 10/18/16 0814)  sodium chloride 0.9 % bolus 1,000 mL (0 mLs Intravenous Stopped 10/18/16 1023)     Initial Impression / Assessment and Plan / ED Course  I have reviewed the triage vital signs and the nursing notes.  Pertinent labs & imaging results that were available during my care of the patient were reviewed by me and considered in my medical decision making (see chart for details).  Clinical Course as of Oct 18 1114  Wed Oct 18, 2016  7412 Pt feels a lot better. Her emesis has resolved. Still has some nausea. PO challenge passed thus far. HR in the 80s. Labs do show rising Cr - likely pre-renal. She also has leukocytosis, but it appears that she has had elevated  whites for some time now, and there is no new symptoms. We will d/c post 2 liters of NS. Strict ER return precautions have been discussed, and patient is agreeing with the plan and is comfortable with the workup done and the recommendations from the ER. Repeat lytes requested. Pt to see her GI. ? Gastroparesis.  [AN]  1114 PO challenge passed. We have decided to send her with bactrim after having a discussion of the results. The decision was based on shared decision making.  PCP in 1 week for repeat lytes advised.  [AN]    Clinical Course User Index [AN] Varney Biles, MD    Pt comes in with abd pain and chest pain.  Abd pain with nausea/emesis. This is not new, but rather chronic. ? Gastroparesis/Gastritis. Will need GI to optimize. Labs ordered. Pain meds, GI cocktail ordered. We will also give Haldol. CT scan of the abd from the last 3 months reviewed.  Chest pain is atypical. No hard cardiac risk factors or PE risk factors. CXR is clear. We will get trop.   Urinary sx. Pt is having some urinary urgency. Hx of pyelonephritis and renal stones. Urine cultures have been neg.   Final Clinical Impressions(s) / ED Diagnoses   Final diagnoses:  Lower urinary tract infectious disease  Chronic gastritis without bleeding, unspecified gastritis type    New Prescriptions New Prescriptions   METOCLOPRAMIDE (REGLAN) 10 MG TABLET    Take 1 tablet (10 mg total) by mouth every 6 (six) hours.   ONDANSETRON (ZOFRAN ODT) 8 MG DISINTEGRATING TABLET  Take 1 tablet (8 mg total) by mouth every 8 (eight) hours as needed for nausea.   PROMETHAZINE (PHENERGAN) 25 MG SUPPOSITORY    Place 1 suppository (25 mg total) rectally every 6 (six) hours as needed for nausea.   PROMETHAZINE (PHENERGAN) 25 MG TABLET    Take 1 tablet (25 mg total) by mouth every 6 (six) hours as needed for nausea.   SULFAMETHOXAZOLE-TRIMETHOPRIM (BACTRIM DS,SEPTRA DS) 800-160 MG TABLET    Take 1 tablet by mouth 2 (two) times daily.      Varney Biles, MD 10/18/16 [place], MD 10/18/16 1115

## 2016-10-18 NOTE — ED Notes (Signed)
Patient transported to X-ray 

## 2016-10-18 NOTE — ED Notes (Addendum)
Attempted to review discharge instructions with patient. Patient states "I know what to do." Patient declined having any more questions.

## 2016-10-18 NOTE — ED Notes (Signed)
Unable to get accurate blood pressure due to patient moving.

## 2016-10-18 NOTE — ED Triage Notes (Signed)
Pt brought in by EMS c/o of Chest Pain x past 2 hours described as tightness with SOB. Pt has had abd pain x 3 days with N/V,  c/o pressure with urination. Pt had a kidney stone recently as per EMS.  Pt had 4 mg of Zofran, and 100 mg of fentanyl

## 2016-10-18 NOTE — Discharge Instructions (Signed)
Take the antibiotics. Please return to the ER if your symptoms worsen; you have increased pain, fevers, chills, inability to keep any medications down. Otherwise see the outpatient doctor as requested.

## 2016-10-18 NOTE — ED Notes (Signed)
Patient given water to drink for PO challenge.

## 2016-10-19 LAB — URINE CULTURE: Culture: 20000 — AB

## 2016-10-20 ENCOUNTER — Emergency Department (HOSPITAL_COMMUNITY)
Admission: EM | Admit: 2016-10-20 | Discharge: 2016-10-20 | Disposition: A | Discharge status: Home / Self Care | Payer: Medicaid Other | Attending: Emergency Medicine | Admitting: Emergency Medicine

## 2016-10-20 DIAGNOSIS — F17210 Nicotine dependence, cigarettes, uncomplicated: Secondary | ICD-10-CM | POA: Insufficient documentation

## 2016-10-20 DIAGNOSIS — Z79899 Other long term (current) drug therapy: Secondary | ICD-10-CM | POA: Insufficient documentation

## 2016-10-20 DIAGNOSIS — K2970 Gastritis, unspecified, without bleeding: Secondary | ICD-10-CM | POA: Insufficient documentation

## 2016-10-20 LAB — COMPREHENSIVE METABOLIC PANEL
ALT: 33 U/L (ref 14–54)
AST: 21 U/L (ref 15–41)
Albumin: 5 g/dL (ref 3.5–5.0)
Alkaline Phosphatase: 58 U/L (ref 38–126)
Anion gap: 13 (ref 5–15)
BUN: 19 mg/dL (ref 6–20)
CHLORIDE: 92 mmol/L — AB (ref 101–111)
CO2: 27 mmol/L (ref 22–32)
CREATININE: 1.06 mg/dL — AB (ref 0.44–1.00)
Calcium: 9.3 mg/dL (ref 8.9–10.3)
Glucose, Bld: 134 mg/dL — ABNORMAL HIGH (ref 65–99)
POTASSIUM: 2.9 mmol/L — AB (ref 3.5–5.1)
SODIUM: 132 mmol/L — AB (ref 135–145)
Total Bilirubin: 1.3 mg/dL — ABNORMAL HIGH (ref 0.3–1.2)
Total Protein: 7.8 g/dL (ref 6.5–8.1)

## 2016-10-20 LAB — URINALYSIS, ROUTINE W REFLEX MICROSCOPIC
GLUCOSE, UA: NEGATIVE mg/dL
KETONES UR: NEGATIVE mg/dL
Nitrite: POSITIVE — AB
PH: 5.5 (ref 5.0–8.0)
PROTEIN: 100 mg/dL — AB
Specific Gravity, Urine: 1.031 — ABNORMAL HIGH (ref 1.005–1.030)

## 2016-10-20 LAB — CBC WITH DIFFERENTIAL/PLATELET
Basophils Absolute: 0.1 10*3/uL (ref 0.0–0.1)
Basophils Relative: 0 %
EOS ABS: 0 10*3/uL (ref 0.0–0.7)
Eosinophils Relative: 0 %
HEMATOCRIT: 39.5 % (ref 36.0–46.0)
HEMOGLOBIN: 13.3 g/dL (ref 12.0–15.0)
LYMPHS ABS: 3 10*3/uL (ref 0.7–4.0)
LYMPHS PCT: 17 %
MCH: 29.2 pg (ref 26.0–34.0)
MCHC: 33.7 g/dL (ref 30.0–36.0)
MCV: 86.8 fL (ref 78.0–100.0)
MONOS PCT: 8 %
Monocytes Absolute: 1.4 10*3/uL — ABNORMAL HIGH (ref 0.1–1.0)
NEUTROS PCT: 75 %
Neutro Abs: 13.3 10*3/uL — ABNORMAL HIGH (ref 1.7–7.7)
Platelets: 435 10*3/uL — ABNORMAL HIGH (ref 150–400)
RBC: 4.55 MIL/uL (ref 3.87–5.11)
RDW: 14.5 % (ref 11.5–15.5)
WBC: 17.8 10*3/uL — AB (ref 4.0–10.5)

## 2016-10-20 LAB — URINE MICROSCOPIC-ADD ON

## 2016-10-20 LAB — LIPASE, BLOOD: Lipase: 16 U/L (ref 11–51)

## 2016-10-20 MED ORDER — GI COCKTAIL ~~LOC~~
30.0000 mL | Freq: Once | ORAL | Status: AC
  Administered 2016-10-20: 30 mL via ORAL
  Filled 2016-10-20: qty 30

## 2016-10-20 MED ORDER — OMEPRAZOLE 20 MG PO CPDR: 20.0000 mg | DELAYED_RELEASE_CAPSULE | Freq: Every day | ORAL | 0 refills | Status: DC

## 2016-10-20 MED ORDER — ONDANSETRON 4 MG PO TBDP
4.0000 mg | ORAL_TABLET | Freq: Once | ORAL | Status: AC | PRN
  Administered 2016-10-20: 4 mg via ORAL
  Filled 2016-10-20: qty 1

## 2016-10-20 MED ORDER — GI COCKTAIL ~~LOC~~: 30.0000 mL | Freq: Three times a day (TID) | ORAL | 0 refills | Status: DC | PRN

## 2016-10-20 MED ORDER — KETOROLAC TROMETHAMINE 30 MG/ML IJ SOLN
30.0000 mg | Freq: Once | INTRAMUSCULAR | Status: AC
  Administered 2016-10-20: 30 mg via INTRAVENOUS
  Filled 2016-10-20: qty 1

## 2016-10-20 MED ORDER — ONDANSETRON HCL 4 MG/2ML IJ SOLN
4.0000 mg | Freq: Once | INTRAMUSCULAR | Status: DC
  Filled 2016-10-20: qty 2

## 2016-10-20 MED ORDER — SODIUM CHLORIDE 0.9 % IV BOLUS (SEPSIS)
1000.0000 mL | Freq: Once | INTRAVENOUS | Status: AC
  Administered 2016-10-20: 1000 mL via INTRAVENOUS

## 2016-10-20 NOTE — ED Notes (Signed)
Pt is c/o nausea and vomiting that began this morning with abdominal pain  10/10. Pt is restless and unable to stay still on stretcher. Pt reports that she has not taken anything for pain or nausea, and that she feels pressure with urination.

## 2016-10-20 NOTE — Discharge Instructions (Signed)
Prilosec and GI cocktail as prescribed.  Call Dr. Oneida Alar' office this morning to arrange a follow-up appointment.

## 2016-10-20 NOTE — ED Notes (Signed)
Per EMS- Pt c/o upper abdominal pain +NVD for past week. Hx of chronic pain.

## 2016-10-20 NOTE — ED Provider Notes (Signed)
[facility] DEPT Provider Note   CSN: [id number] Arrival date & time: 10/20/16  5427     History   Chief Complaint Chief Complaint  Patient presents with  . Abdominal Pain    HPI Jeanne Mcneil is a 30 y.o. female.  Patient is a 30 year old female with history of chronic abdominal pain, kidney stones, hiatal hernia. She presents for evaluation of severe abdominal pain and vomiting that started 2 days ago. Her symptoms are rapidly worsening. She denies any fevers or chills. She reports feeling "dehydrated".   The history is provided by the patient.  Abdominal Pain   This is a chronic problem. The problem occurs constantly. The pain is located in the epigastric region. The pain is severe. Associated symptoms include vomiting. Pertinent negatives include fever and constipation. Nothing aggravates the symptoms. Nothing relieves the symptoms.    Past Medical History:  Diagnosis Date  . Anxiety   . Back pain   . Constipation   . Depression   . GERD (gastroesophageal reflux disease)   . Glaucoma of both eyes   . Hematuria   . History of abnormal cervical Pap smear   . History of chronic gastritis   . History of kidney stones   . Hydronephrosis, right   . Renal calculi    bilateral per ct 0902-2017  . Right ureteral stone   . Urgency of urination   . Uterine fibroid     Patient Active Problem List   Diagnosis Date Noted  . Pyelonephritis 10/11/2016  . Hypokalemia 10/11/2016  . Hyperglycemia 10/11/2016  . Elevated blood pressure reading 10/11/2016  . Left nephrolithiasis 10/11/2016  . Renal colic on left side 05/13/7627  . Generalized anxiety disorder 10/11/2016  . Nausea with vomiting 07/26/2016  . Ureteral stone 07/04/2016  . UTI (urinary tract infection) 07/04/2016  . Hydronephrosis of right kidney 07/04/2016  . GERD (gastroesophageal reflux disease) 07/04/2016  . Ureteral calculi 07/04/2016  . Abdominal pain, chronic, epigastric   . Hematemesis 05/16/2016    . Heme + stool 05/16/2016  . Abdominal pain, epigastric 05/16/2016    Past Surgical History:  Procedure Laterality Date  . CYSTOSCOPY W/ URETERAL STENT PLACEMENT Right 07/04/2016   Procedure: CYSTOSCOPY WITH RIGHT RETROGRADE PYELOGRAM, RIGHT URETERAL STENT PLACEMENT;  Surgeon: Ardis Hughs, MD;  Location: [facility];  Service: Urology;  Laterality: Right;  . CYSTOSCOPY WITH RETROGRADE PYELOGRAM, URETEROSCOPY AND STENT PLACEMENT Left 11/28/2007  . CYSTOSCOPY WITH RETROGRADE PYELOGRAM, URETEROSCOPY AND STENT PLACEMENT Right 08/02/2016   Procedure: CYSTOSCOPY WITH RIGHT  RETROGRADE PYELOGRAM, URETEROSCOPY,  AND STENT EXCHANGE;  Surgeon: Ardis Hughs, MD;  Location: [facility];  Service: Urology;  Laterality: Right;  . ESOPHAGOGASTRODUODENOSCOPY N/A 05/17/2016   Procedure: ESOPHAGOGASTRODUODENOSCOPY (EGD);  Surgeon: Danie Binder, MD;  Location: [facility];  Service: Endoscopy;  Laterality: N/A;  830   . INDUCED ABORTION  2009    OB History    Gravida Para Term Preterm AB Living   3 2 2   1 2    SAB TAB Ectopic Multiple Live Births   1       2       Home Medications    Prior to Admission medications   Medication Sig Start Date End Date Taking? Authorizing Provider  acetaminophen (TYLENOL) 500 MG tablet Take 1,000 mg by mouth every 6 (six) hours as needed.    Historical Provider, MD  busPIRone (BUSPAR) 5 MG tablet Take 5 mg by mouth 2 (two) times  daily.    Historical Provider, MD  ciprofloxacin (CIPRO) 500 MG tablet Take 1 tablet (500 mg total) by mouth 2 (two) times daily. Patient not taking: Reported on 10/18/2016 10/12/16   Donne Hazel, MD  dexlansoprazole (DEXILANT) 60 MG capsule Take 1 capsule (60 mg total) by mouth daily. Patient not taking: Reported on 10/18/2016 07/26/16   Carlis Stable, NP  dicyclomine (BENTYL) 20 MG tablet Take 1 tablet (20 mg total) by mouth every 6 (six) hours as needed for spasms (abdominal cramping). 07/22/16   Francine Graven, DO   diphenhydramine-acetaminophen (TYLENOL PM) 25-500 MG TABS tablet Take 1 tablet by mouth at bedtime as needed (sleep/pain).     Historical Provider, MD  metoCLOPramide (REGLAN) 10 MG tablet Take 1 tablet (10 mg total) by mouth every 6 (six) hours. 10/18/16   Varney Biles, MD  naproxen (NAPROSYN) 500 MG tablet Take 1 tablet (500 mg total) by mouth 2 (two) times daily. Patient not taking: Reported on 10/18/2016 10/16/16   Clayton Bibles, PA-C  ondansetron (ZOFRAN ODT) 8 MG disintegrating tablet Take 1 tablet (8 mg total) by mouth every 8 (eight) hours as needed for nausea. 10/18/16   Varney Biles, MD  promethazine (PHENERGAN) 25 MG suppository Place 1 suppository (25 mg total) rectally every 6 (six) hours as needed for nausea. 10/18/16   Varney Biles, MD  promethazine (PHENERGAN) 25 MG tablet Take 1 tablet (25 mg total) by mouth every 6 (six) hours as needed for nausea. 10/18/16   Varney Biles, MD  sertraline (ZOLOFT) 50 MG tablet Take 50 mg by mouth every morning.     Historical Provider, MD  sulfamethoxazole-trimethoprim (BACTRIM DS,SEPTRA DS) 800-160 MG tablet Take 1 tablet by mouth 2 (two) times daily. 10/18/16 10/25/16  Varney Biles, MD    Family History Family History  Problem Relation Age of Onset  . Colon cancer Paternal Uncle     Passed away 01/29/15  . Nephrolithiasis Father   . Hypertension Father   . Nephrolithiasis Sister   . Nephrolithiasis Sister   . Nephrolithiasis Sister   . Hypertension Mother     Social History Social History  Substance Use Topics  . Smoking status: Current Every Day Smoker    Packs/day: 0.50    Years: 20.00    Types: Cigarettes  . Smokeless tobacco: Never Used  . Alcohol use 2.4 oz/week    4 Shots of liquor per week     Comment: No ETOH in 4 months; Previously: occasionally, once every 2-3 months.     Allergies   Keflex [cephalexin]; Codeine; Tape; and Vicodin [hydrocodone-acetaminophen]   Review of Systems Review of Systems   Constitutional: Negative for fever.  Gastrointestinal: Positive for abdominal pain and vomiting. Negative for constipation.  All other systems reviewed and are negative.    Physical Exam Updated Vital Signs BP 138/88 (BP Location: Left Arm)   Pulse 106   Temp 97.6 F (36.4 C) (Oral)   Resp 18   LMP 09/17/2016 (Approximate)   SpO2 99%   Physical Exam  Constitutional: She is oriented to person, place, and time. She appears well-developed and well-nourished. No distress.  Patient is extremely dramatic. She is sobbing, tearful, and writhing all over the exam table.  HENT:  Head: Normocephalic and atraumatic.  Neck: Normal range of motion. Neck supple.  Cardiovascular: Normal rate and regular rhythm.  Exam reveals no gallop and no friction rub.   No murmur heard. Pulmonary/Chest: Effort normal and breath sounds normal. No respiratory distress.  She has no wheezes.  Abdominal: Soft. Bowel sounds are normal. She exhibits no distension. There is tenderness. There is no rebound and no guarding.  There is tenderness to palpation in the epigastric region.  Musculoskeletal: Normal range of motion.  Neurological: She is alert and oriented to person, place, and time.  Skin: Skin is warm and dry. She is not diaphoretic.  Nursing note and vitals reviewed.    ED Treatments / Results  Labs (all labs ordered are listed, but only abnormal results are displayed) Labs Reviewed  URINALYSIS, ROUTINE W REFLEX MICROSCOPIC (NOT AT [facility]) - Abnormal; Notable for the following:       Result Value   Color, Urine ORANGE (*)    APPearance TURBID (*)    Specific Gravity, Urine 1.031 (*)    Hgb urine dipstick LARGE (*)    Bilirubin Urine MODERATE (*)    Protein, ur 100 (*)    Nitrite POSITIVE (*)    Leukocytes, UA SMALL (*)    All other components within normal limits  URINE MICROSCOPIC-ADD ON    EKG  EKG Interpretation None       Radiology Dg Chest 2 View  Result Date:  10/18/2016 CLINICAL DATA:  New onset mid upper chest pain associated with shortness of breath and nausea and vomiting this morning. Current smoker EXAM: CHEST  2 VIEW COMPARISON:  PA and lateral chest x-ray of Mar 23, 2011 FINDINGS: The lungs are mildly hyperinflated but clear. There is no pneumothorax or pleural effusion. The heart and pulmonary vascularity are normal. The mediastinum is normal in width. The bony thorax is unremarkable. IMPRESSION: Mild hyperinflation which may be voluntary or may reflect bronchitic/reactive airway changes. No pneumonia nor other acute cardiopulmonary abnormality. Electronically Signed   By: David  Martinique M.D.   On: 10/18/2016 07:44    Procedures Procedures (including critical care time)  Medications Ordered in ED Medications  gi cocktail (Maalox,Lidocaine,Donnatal) (not administered)  sodium chloride 0.9 % bolus 1,000 mL (not administered)  ketorolac (TORADOL) 30 MG/ML injection 30 mg (not administered)  ondansetron (ZOFRAN) injection 4 mg (not administered)  ondansetron (ZOFRAN-ODT) disintegrating tablet 4 mg (4 mg Oral Given 10/20/16 0423)     Initial Impression / Assessment and Plan / ED Course  I have reviewed the triage vital signs and the nursing notes.  Pertinent labs & imaging results that were available during my care of the patient were reviewed by me and considered in my medical decision making (see chart for details).  Clinical Course     Patient with history of chronic abdominal pain followed by Dr. Oneida Alar in [place]. She presents today with severe epigastric pain and burning. He is also had episodes of vomiting. She was given a GI cocktail and IV fluids and now appears much better. She tells me she is unable to eat or drink, however I see no evidence of dehydration or other abnormality in her blood work for urinalysis. She is negative for ketones. She does have nitrite positive urine, however this appears to be an ongoing issue for her. With  no symptoms, fever, I see no indication for treatment. Her white count is elevated which is baseline for her and is elevated to a lesser degree today than her last visit.  I see no indication for any further imaging and believe she is appropriate for discharge. She will be given a prescription for a GI cocktail, started on an antacid, and is to follow-up with her gastroenterologist.  Final Clinical Impressions(s) /  ED Diagnoses   Final diagnoses:  None    New Prescriptions New Prescriptions   No medications on file     Veryl Speak, MD 10/20/16 [phone]

## 2016-10-23 ENCOUNTER — Encounter (HOSPITAL_COMMUNITY): Payer: Self-pay | Admitting: Emergency Medicine

## 2016-10-23 ENCOUNTER — Telehealth: Payer: Self-pay | Admitting: Gastroenterology

## 2016-10-23 ENCOUNTER — Ambulatory Visit (INDEPENDENT_AMBULATORY_CARE_PROVIDER_SITE_OTHER): Payer: Self-pay | Admitting: Gastroenterology

## 2016-10-23 ENCOUNTER — Emergency Department (HOSPITAL_COMMUNITY)
Admission: EM | Admit: 2016-10-23 | Discharge: 2016-10-23 | Disposition: A | Discharge status: Home / Self Care | Payer: Medicaid Other | Attending: Dermatology | Admitting: Dermatology

## 2016-10-23 ENCOUNTER — Telehealth: Payer: Self-pay

## 2016-10-23 ENCOUNTER — Encounter: Payer: Self-pay | Admitting: Gastroenterology

## 2016-10-23 VITALS — BP 142/103 | HR 105 | Temp 97.6°F | Ht 62.0 in | Wt 133.0 lb

## 2016-10-23 DIAGNOSIS — F17210 Nicotine dependence, cigarettes, uncomplicated: Secondary | ICD-10-CM | POA: Insufficient documentation

## 2016-10-23 DIAGNOSIS — R1013 Epigastric pain: Secondary | ICD-10-CM

## 2016-10-23 DIAGNOSIS — R109 Unspecified abdominal pain: Secondary | ICD-10-CM | POA: Insufficient documentation

## 2016-10-23 DIAGNOSIS — Z5321 Procedure and treatment not carried out due to patient leaving prior to being seen by health care provider: Secondary | ICD-10-CM | POA: Insufficient documentation

## 2016-10-23 DIAGNOSIS — R112 Nausea with vomiting, unspecified: Secondary | ICD-10-CM

## 2016-10-23 DIAGNOSIS — Z79899 Other long term (current) drug therapy: Secondary | ICD-10-CM | POA: Insufficient documentation

## 2016-10-23 DIAGNOSIS — G8929 Other chronic pain: Secondary | ICD-10-CM

## 2016-10-23 LAB — CBC WITH DIFFERENTIAL/PLATELET
BASOS PCT: 0 %
Basophils Absolute: 0 cells/uL (ref 0–200)
EOS PCT: 0 %
Eosinophils Absolute: 0 cells/uL — ABNORMAL LOW (ref 15–500)
HEMATOCRIT: 44.8 % (ref 35.0–45.0)
Hemoglobin: 15 g/dL (ref 11.7–15.5)
LYMPHS PCT: 10 %
Lymphs Abs: 2620 cells/uL (ref 850–3900)
MCH: 29.4 pg (ref 27.0–33.0)
MCHC: 33.5 g/dL (ref 32.0–36.0)
MCV: 87.7 fL (ref 80.0–100.0)
MONOS PCT: 4 %
MPV: 9.4 fL (ref 7.5–12.5)
Monocytes Absolute: 1048 cells/uL — ABNORMAL HIGH (ref 200–950)
NEUTROS PCT: 86 %
Neutro Abs: 22532 cells/uL — ABNORMAL HIGH (ref 1500–7800)
PLATELETS: 476 10*3/uL — AB (ref 140–400)
RBC: 5.11 MIL/uL — AB (ref 3.80–5.10)
RDW: 14.9 % (ref 11.0–15.0)
WBC: 26.2 10*3/uL — AB (ref 3.8–10.8)

## 2016-10-23 LAB — COMPREHENSIVE METABOLIC PANEL
ALK PHOS: 65 U/L (ref 33–115)
ALT: 19 U/L (ref 6–29)
AST: 17 U/L (ref 10–30)
Albumin: 5 g/dL (ref 3.6–5.1)
BUN: 27 mg/dL — ABNORMAL HIGH (ref 7–25)
CALCIUM: 10.6 mg/dL — AB (ref 8.6–10.2)
CO2: 28 mmol/L (ref 20–31)
CREATININE: 0.98 mg/dL (ref 0.50–1.10)
Chloride: 88 mmol/L — ABNORMAL LOW (ref 98–110)
Glucose, Bld: 120 mg/dL — ABNORMAL HIGH (ref 65–99)
Potassium: 3.4 mmol/L — ABNORMAL LOW (ref 3.5–5.3)
SODIUM: 133 mmol/L — AB (ref 135–146)
Total Bilirubin: 1 mg/dL (ref 0.2–1.2)
Total Protein: 7.9 g/dL (ref 6.1–8.1)

## 2016-10-23 LAB — URINE MICROSCOPIC-ADD ON: WBC, UA: NONE SEEN WBC/hpf (ref 0–5)

## 2016-10-23 LAB — URINALYSIS, ROUTINE W REFLEX MICROSCOPIC
Glucose, UA: NEGATIVE mg/dL
KETONES UR: 15 mg/dL — AB
LEUKOCYTES UA: NEGATIVE
Nitrite: NEGATIVE
PROTEIN: 100 mg/dL — AB
Specific Gravity, Urine: >1.03 — ABNORMAL HIGH (ref 1.005–1.030)
pH: 6 (ref 5.0–8.0)

## 2016-10-23 LAB — LIPASE: LIPASE: 19 U/L (ref 7–60)

## 2016-10-23 MED ORDER — DEXLANSOPRAZOLE 60 MG PO CPDR: 60.0000 mg | DELAYED_RELEASE_CAPSULE | Freq: Every day | ORAL | 3 refills | Status: DC

## 2016-10-23 MED ORDER — OXYCODONE-ACETAMINOPHEN 5-325 MG PO TABS: 1.0000 | ORAL_TABLET | Freq: Four times a day (QID) | ORAL | 0 refills | Status: DC | PRN

## 2016-10-23 NOTE — Telephone Encounter (Signed)
PATIENT PRESCRIPTION WAS CALLED [facility], [facility] NEED[facility] [place]

## 2016-10-23 NOTE — Telephone Encounter (Signed)
Pt was seen in the office today. We are out of dexilant samples. I tried to get it approved through [name] and the medicaid system was telling me she did not have active medicaid. When I went in and asked the pt about it, she said she thought it was still active and then she broke down crying in the room. I explained to her that she needed blood work done and was given a one time rx for pain medication and dexilant was at the pharmacy and if she called social services and got her medicaid straightened out, I would be more than happy to get that approved for her. Pt stated she didn't have any money and was crying. pts mother said she would help her take care of it and will take her for her bloodwork.

## 2016-10-23 NOTE — Progress Notes (Signed)
Primary Care Physician:  Philis Fendt, MD  Primary Gastroenterologist:  Barney Drain, MD   Chief Complaint  Patient presents with  . Abdominal Pain    right side of naval, went to ER 10/20/16 (ER 5x in 1 month)  . Anorexia  . Weight Loss  . Emesis    HPI:  Jeanne Mcneil is a 30 y.o. female here for same day appointment. She has h/o nephrolithiasis, chronic abd pain. She has had numerous ER visits for abdominal pain. Recent admission for pyelonephritis with left flank pain. She states she has had abdominal pain for over 7 months, but it has been really bad the last 2 months. Describes pain in the epigastric area and right abdomen. Associated with N/V. Unable to eat. Reports 30 pound weight loss in last one to two months. Finds it hard to urinate. Has to strain to get stream started. Having spasms in the kidneys. Usually feels kidney stones in her back/flanks. She has h/o kidney stones she she was a kid. C/o pp abd pain. Hurts to swallow. Takes 15 baths per day to "relax my muscles". Marijuana 1-2 times pwer week for past 6 months for nausea and pain. Complains of painful swallowing. Infrequent BM since minimal oral intake. Denies melena, brbpr.   Takes iIbuprofen 1675m at a time.   After patient left the office today, she went to the ER and checked in at 11am but decided to leave without being seen. She went to lab for blood draw.   H/o leukocytosis over 20,000 in 04/2016 and 06/2016. Down t0 11,400 on 07/06/16. Multiple rechecks has been elevated at least 13,000 or higher with highest of 28,000 on 10/10/16 at time of her admission for pyelonephritis. WBC consistently 17,000 to 23,000 since then. Needs to be rechecked in light of acute on chronic pain.   Current Outpatient Prescriptions  Medication Sig Dispense Refill  . busPIRone (BUSPAR) 5 MG tablet Take 5 mg by mouth 2 (two) times daily.    . ciprofloxacin (CIPRO) 500 MG tablet Take 1 tablet (500 mg total) by mouth 2 (two) times daily.  12 tablet 0  . ondansetron (ZOFRAN ODT) 8 MG disintegrating tablet Take 1 tablet (8 mg total) by mouth every 8 (eight) hours as needed for nausea. 20 tablet 0  . promethazine (PHENERGAN) 25 MG tablet Take 1 tablet (25 mg total) by mouth every 6 (six) hours as needed for nausea. 30 tablet 0  . sertraline (ZOLOFT) 50 MG tablet Take 50 mg by mouth every morning.     .       .       No current facility-administered medications for this visit.     Allergies as of 10/23/2016 - Review Complete 10/23/2016  Allergen Reaction Noted  . Keflex [cephalexin] Other (See Comments) 07/25/2016  . Codeine Hives, Itching, and Other (See Comments) 05/17/2013  . Tape Other (See Comments) 08/02/2016  . Vicodin [hydrocodone-acetaminophen] Hives and Itching 09/16/2012    Past Medical History:  Diagnosis Date  . Anxiety   . Back pain   . Constipation   . Depression   . GERD (gastroesophageal reflux disease)   . Glaucoma of both eyes   . Hematuria   . History of abnormal cervical Pap smear   . History of chronic gastritis   . History of kidney stones   . Hydronephrosis, right   . Renal calculi    bilateral per ct 0902-2017  . Right ureteral stone   . Urgency of  urination   . Uterine fibroid     Past Surgical History:  Procedure Laterality Date  . CYSTOSCOPY W/ URETERAL STENT PLACEMENT Right 07/04/2016   Procedure: CYSTOSCOPY WITH RIGHT RETROGRADE PYELOGRAM, RIGHT URETERAL STENT PLACEMENT;  Surgeon: Ardis Hughs, MD;  Location: [facility];  Service: Urology;  Laterality: Right;  . CYSTOSCOPY WITH RETROGRADE PYELOGRAM, URETEROSCOPY AND STENT PLACEMENT Left 11/28/2007  . CYSTOSCOPY WITH RETROGRADE PYELOGRAM, URETEROSCOPY AND STENT PLACEMENT Right 08/02/2016   Procedure: CYSTOSCOPY WITH RIGHT  RETROGRADE PYELOGRAM, URETEROSCOPY,  AND STENT EXCHANGE;  Surgeon: Ardis Hughs, MD;  Location[facility];  Service: Urology;  Laterality: Right;  . ESOPHAGOGASTRODUODENOSCOPY N/A 05/17/2016    Procedure: ESOPHAGOGASTRODUODENOSCOPY (EGD);  Surgeon: Danie Binder, MD;  Location: [facility];  Service: Endoscopy;  Laterality: N/A;  830   . INDUCED ABORTION  01-27-2008    Family History  Problem Relation Age of Onset  . Colon cancer Paternal Uncle     Passed away 01/26/15  . Nephrolithiasis Father   . Hypertension Father   . Nephrolithiasis Sister   . Nephrolithiasis Sister   . Nephrolithiasis Sister   . Hypertension Mother     Social History   Social History  . Marital status: Single    Spouse name: N/A  . Number of children: 2  . Years of education: 11   Occupational History  . Unemployed    Social History Main Topics  . Smoking status: Current Every Day Smoker    Packs/day: 0.50    Years: 20.00    Types: Cigarettes  . Smokeless tobacco: Never Used  . Alcohol use 2.4 oz/week    4 Shots of liquor per week     Comment: No ETOH in 4 months; Previously: occasionally, once every 2-3 months.  . Drug use:     Types: Marijuana, Barbituates     Comment: "Only when I feel nauseated and can't function"  . Sexual activity: Yes    Birth control/ protection: None   Other Topics Concern  . Not on file   Social History Narrative   Lives with her father and step-mother. Unemployed.      ROS:  General: + for anorexia, weight loss. No fever, chills, fatigue, weakness. Eyes: Negative for vision changes.  ENT: Negative for hoarseness, difficulty swallowing , nasal congestion. CV: Negative for chest pain, angina, palpitations, dyspnea on exertion, peripheral edema.  Respiratory: Negative for dyspnea at rest, dyspnea on exertion, cough, sputum, wheezing.  GI: See history of present illness. GU:  Negative for dysuria, hematuria, urinary incontinence, urinary frequency, nocturnal urination. Strain for urination MS: Negative for joint pain, low back pain.  Derm: Negative for rash or itching.  Neuro: Negative for weakness, abnormal sensation, seizure, frequent headaches,  memory loss, confusion.  Psych: Negative for anxiety, depression. frustrated with pain. Denies homocidal/ suicidal ideation, hallucinations. Mother voiced concerns with me privately regarding patient not wanted to live with this pain. I spoke to the patient privately and she denies any desire to hurt herself.  Endo: Negative for unusual weight change.  Heme: Negative for bruising or bleeding. Allergy: Negative for rash or hives.    Physical Examination:  BP (!) 142/103   Pulse (!) 105   Temp 97.6 F (36.4 C) (Oral)   Ht 5' 2"  (1.575 m)   Wt 133 lb (60.3 kg)   LMP 10/19/2016 (Approximate)   BMI 24.33 kg/m    General: tearful, WF, appears uncomfortable, accompanied by mother.  Head: Normocephalic, atraumatic.  Eyes: Conjunctiva pink, no icterus. Mouth: Oropharyngeal mucosa moist and pink , no lesions erythema or exudate. Neck: Supple without thyromegaly, masses, or lymphadenopathy.  Lungs: Clear to auscultation bilaterally.  Heart: Regular rate and rhythm, no murmurs rubs or gallops.  Abdomen: Bowel sounds are normal, moderate epigastric tenderness, nondistended, no hepatosplenomegaly or masses, no abdominal bruits or    hernia , no rebound or guarding.   Rectal: not performed Extremities: No lower extremity edema. No clubbing or deformities.  Neuro: Alert and oriented x 4 , grossly normal neurologically.  Skin: Warm and dry, no rash or jaundice.   Psych: Alert and cooperative, normal mood and affect.  Labs: Reviewed, pertinent labs as noted in hpi.   Imaging Studies: Dg Chest 2 View  Result Date: 10/18/2016 CLINICAL DATA:  New onset mid upper chest pain associated with shortness of breath and nausea and vomiting this morning. Current smoker EXAM: CHEST  2 VIEW COMPARISON:  PA and lateral chest x-ray of Mar 23, 2011 FINDINGS: The lungs are mildly hyperinflated but clear. There is no pneumothorax or pleural effusion. The heart and pulmonary vascularity are normal. The mediastinum  is normal in width. The bony thorax is unremarkable. IMPRESSION: Mild hyperinflation which may be voluntary or may reflect bronchitic/reactive airway changes. No pneumonia nor other acute cardiopulmonary abnormality. Electronically Signed   By: David  Martinique M.D.   On: 10/18/2016 07:44   Ct Abdomen Pelvis W Contrast  Result Date: 10/11/2016 CLINICAL DATA:  Abdominal wall/left flank region pain with nausea and vomiting EXAM: CT ABDOMEN AND PELVIS WITH CONTRAST TECHNIQUE: Multidetector CT imaging of the abdomen and pelvis was performed using the standard protocol following bolus administration of intravenous contrast. CONTRAST:  144m ISOVUE-300 IOPAMIDOL (ISOVUE-300) INJECTION 61% COMPARISON:  July 22, 2016 FINDINGS: Lower chest: There is posterior bibasilar atelectatic change. Lung bases otherwise are clear. There is a small hiatal hernia. Hepatobiliary: There is fatty infiltration near the fissure for the ligamentum teres. Beyond this focal fatty infiltration, no liver lesions are apparent. Gallbladder wall is not appreciably thickened. There is no biliary duct dilatation. Pancreas: There is no pancreatic mass or inflammatory focus. Spleen: No splenic lesions are evident. Adrenals/Urinary Tract: Adrenals bilaterally appear unremarkable. There is a cyst in the upper pole of the left kidney measuring 1.2 x 1.2 cm. There is no hydronephrosis on either side. There is a 4 x 4 mm calculus in the mid left kidney. No ureteral calculi are evident on either side. Urinary bladder is midline with wall thickness within normal limits. Stomach/Bowel: There is no appreciable bowel wall or mesenteric thickening. There are occasional sigmoid diverticula without diverticulitis. Colon is largely collapsed. There is no bowel obstruction. No free air or portal venous air. Vascular/Lymphatic: There is no abdominal aortic aneurysm. No vascular lesions are evident. There is no appreciable adenopathy in the abdomen or pelvis.  Reproductive: Uterus is anteverted. There is no evident pelvic mass or pelvic fluid collection. Other: The appendix appears normal. There is no ascites or abscess in the abdomen or pelvis. There is a minimal ventral hernia containing only fat. Musculoskeletal: There is lumbar dextroscoliosis. There are no blastic or lytic bone lesions. There is no intramuscular or abdominal wall lesion. IMPRESSION: 4 x 4 mm calculus in the left kidney, nonobstructing. No hydronephrosis. No ureteral calculus on either side. Occasional sigmoid diverticula without diverticulitis. No bowel obstruction. No abscess. Appendix appears normal. Small hiatal hernia present. Minimal ventral hernia containing only fat. Electronically Signed   By: WLowella GripIII  M.D.   On: 10/11/2016 16:43   US Renal  Result Date: 10/16/2016 CLINICAL DATA:  LEFT flank pain. EXAM: RENAL / URINARY TRACT ULTRASOUND COMPLETE COMPARISON:  CT abdomen and pelvis October 11, 2016 FINDINGS: Right Kidney: Length: 12.4 cm. Echogenicity within normal limits. No mass or hydronephrosis visualized. Left Kidney: Length: 11.6 cm. Echogenicity within normal limits. No mass or hydronephrosis visualized. 7 mm echogenic upper pole nephrolithiasis. 10 mm anechoic LEFT upper pole simple cyst. Bladder: Appears normal for degree of bladder distention. IMPRESSION: 7 mm nonobstructing LEFT nephrolithiasis. Electronically Signed   By: Elon Alas M.D.   On: 10/16/2016 17:28   US Renal  Result Date: 10/10/2016 CLINICAL DATA:  Left flank pain EXAM: RENAL ULTRASOUND COMPARISON:  CT abdomen and pelvis July 22, 2016 FINDINGS: Right Kidney: Length: 11.7 cm. Echogenicity and renal cortical thickness are within normal limits. No mass, perinephric fluid, or hydronephrosis visualized. No sonographically demonstrable calculus or ureterectasis. Left Kidney: Length: 10.7 cm. Echogenicity and renal cortical thickness are within normal limits. No mass, perinephric fluid, or  hydronephrosis visualized. No sonographically demonstrable calculus or ureterectasis. Bladder: Empty and cannot be assessed by ultrasound at this time. IMPRESSION: Normal appearing kidneys bilaterally. Electronically Signed   By: Lowella Grip III M.D.   On: 10/10/2016 10:39

## 2016-10-23 NOTE — Patient Instructions (Addendum)
1. STAT labs today. 2. One time RX for pain medication provided.  3. START Dexilant once daily in the mornings. May take first dose today. You may open the capsule and take in applesauce. Do not chew.  4. Stop omeprazole.

## 2016-10-23 NOTE — ED Triage Notes (Signed)
All over abd pain.  Sent by her pcp for [place] [id number].  Pt here earlier today and ama from waiting room d/t wait.

## 2016-10-23 NOTE — ED Notes (Signed)
PT stated she was going to the lab across the road and told registration she was leaving.

## 2016-10-23 NOTE — Telephone Encounter (Signed)
Noted. Await blood work.

## 2016-10-23 NOTE — Progress Notes (Addendum)
I spoke to patient and then spoke to her mother at patient's request.  Patient's WBC is up again. Consistently elevated values going back at least to 04/2016 when she started having abdominal pain. Looking at [facility], patient's multiple ER visits started at this time. She reports abdominal pain, n/v off and on since then.   I spoke to Dr. Gala Romney. He advises the patient go to ER given wbc of 26,000 and abd pain, vomiting. Her appendix, gallbladder remain in situ. Also with her urinary issues, currently unable to keep down oral antibiotics. May need repeat CT imaging versus surgical consultation if no apparent source of infection.   I notified Gerald Stabs, triage nurse, regarding above.

## 2016-10-23 NOTE — ED Triage Notes (Signed)
Patient states she just left her PCP and was supposed to go get blood work "but I was just hurting so bad I couldn't take it anymore so I just came here." Complaining of bilateral flank pain x 2 weeks.

## 2016-10-23 NOTE — Assessment & Plan Note (Signed)
30 y/o female with h/o nephrolithiasis since childhood who presents for same day appointment with her mother for acute on chronic abd pain. She has had multiple ER visits especially in the last two months and one overnight admission. She complains of pp N/V, epig pain, right sided abd pain. Unable to eat. Documented 15 pound weight loss since 06/2016. EGD earlier this year with gastritis and mild reflux esophagitis. She admits to intermittent high dose NSAID use (ibuprofen). Has not taken PPI consistently. Recent ER visit with significant hypokalemia and intermittent moderate leukocytosis.   We will send her for STAT labs. Start PPI daily, we were trying to get her Dexilant covered (did well on it previously) but Medicaid is saying she is not active. We had no samples. For now she is advised to take her omeprazole daily. She states she has RX at home that she just filled and has only been on for one day. This transpired after she left the office. Once labs are back, further recommendations to follow. Can go to the ER for fever, worsening abdominal pain, inability to get down liquids. Advised to consume liquid diet for now until nausea and vomiting improved.

## 2016-10-24 ENCOUNTER — Encounter (HOSPITAL_COMMUNITY): Payer: Self-pay | Admitting: *Deleted

## 2016-10-24 ENCOUNTER — Emergency Department (HOSPITAL_COMMUNITY)
Admission: EM | Admit: 2016-10-24 | Discharge: 2016-10-24 | Disposition: A | Discharge status: Home / Self Care | Payer: Medicaid Other | Attending: Emergency Medicine | Admitting: Emergency Medicine

## 2016-10-24 ENCOUNTER — Telehealth: Payer: Self-pay | Admitting: Gastroenterology

## 2016-10-24 ENCOUNTER — Telehealth: Payer: Self-pay | Admitting: General Practice

## 2016-10-24 ENCOUNTER — Emergency Department (HOSPITAL_COMMUNITY): Payer: Medicaid Other

## 2016-10-24 ENCOUNTER — Emergency Department (HOSPITAL_COMMUNITY)
Admission: EM | Admit: 2016-10-24 | Discharge: 2016-10-24 | Disposition: A | Discharge status: Home / Self Care | Payer: Medicaid Other | Attending: Dermatology | Admitting: Dermatology

## 2016-10-24 DIAGNOSIS — F17210 Nicotine dependence, cigarettes, uncomplicated: Secondary | ICD-10-CM | POA: Insufficient documentation

## 2016-10-24 DIAGNOSIS — T39395A Adverse effect of other nonsteroidal anti-inflammatory drugs [NSAID], initial encounter: Secondary | ICD-10-CM

## 2016-10-24 DIAGNOSIS — K2960 Other gastritis without bleeding: Secondary | ICD-10-CM

## 2016-10-24 DIAGNOSIS — R109 Unspecified abdominal pain: Secondary | ICD-10-CM

## 2016-10-24 DIAGNOSIS — N939 Abnormal uterine and vaginal bleeding, unspecified: Secondary | ICD-10-CM

## 2016-10-24 DIAGNOSIS — Z79899 Other long term (current) drug therapy: Secondary | ICD-10-CM | POA: Insufficient documentation

## 2016-10-24 DIAGNOSIS — E876 Hypokalemia: Secondary | ICD-10-CM | POA: Insufficient documentation

## 2016-10-24 DIAGNOSIS — G8929 Other chronic pain: Secondary | ICD-10-CM

## 2016-10-24 DIAGNOSIS — D259 Leiomyoma of uterus, unspecified: Secondary | ICD-10-CM | POA: Insufficient documentation

## 2016-10-24 HISTORY — DX: Polyp of corpus uteri: N84.0

## 2016-10-24 LAB — URINALYSIS, ROUTINE W REFLEX MICROSCOPIC
BILIRUBIN URINE: NEGATIVE
Glucose, UA: NEGATIVE mg/dL
Ketones, ur: 15 mg/dL — AB
Nitrite: POSITIVE — AB
Protein, ur: >300 mg/dL — AB
SPECIFIC GRAVITY, URINE: 1.02 (ref 1.005–1.030)
pH: 6.5 (ref 5.0–8.0)

## 2016-10-24 LAB — CBC WITH DIFFERENTIAL/PLATELET
Basophils Absolute: 0.1 10*3/uL (ref 0.0–0.1)
Basophils Relative: 1 %
Eosinophils Absolute: 0.1 10*3/uL (ref 0.0–0.7)
Eosinophils Relative: 1 %
HEMATOCRIT: 41.7 % (ref 36.0–46.0)
HEMOGLOBIN: 14.1 g/dL (ref 12.0–15.0)
LYMPHS ABS: 4.5 10*3/uL — AB (ref 0.7–4.0)
LYMPHS PCT: 47 %
MCH: 29.6 pg (ref 26.0–34.0)
MCHC: 33.8 g/dL (ref 30.0–36.0)
MCV: 87.4 fL (ref 78.0–100.0)
MONOS PCT: 12 %
Monocytes Absolute: 1.1 10*3/uL — ABNORMAL HIGH (ref 0.1–1.0)
NEUTROS ABS: 3.7 10*3/uL (ref 1.7–7.7)
NEUTROS PCT: 39 %
Platelets: 390 10*3/uL (ref 150–400)
RBC: 4.77 MIL/uL (ref 3.87–5.11)
RDW: 14.4 % (ref 11.5–15.5)
WBC: 9.6 10*3/uL (ref 4.0–10.5)

## 2016-10-24 LAB — COMPREHENSIVE METABOLIC PANEL
ALBUMIN: 4.6 g/dL (ref 3.5–5.0)
ALK PHOS: 55 U/L (ref 38–126)
ALT: 18 U/L (ref 14–54)
ANION GAP: 14 (ref 5–15)
AST: 18 U/L (ref 15–41)
BILIRUBIN TOTAL: 0.9 mg/dL (ref 0.3–1.2)
BUN: 21 mg/dL — ABNORMAL HIGH (ref 6–20)
CALCIUM: 9.5 mg/dL (ref 8.9–10.3)
CO2: 29 mmol/L (ref 22–32)
Chloride: 86 mmol/L — ABNORMAL LOW (ref 101–111)
Creatinine, Ser: 0.76 mg/dL (ref 0.44–1.00)
GLUCOSE: 94 mg/dL (ref 65–99)
Potassium: 2.5 mmol/L — CL (ref 3.5–5.1)
Sodium: 129 mmol/L — ABNORMAL LOW (ref 135–145)
TOTAL PROTEIN: 7.4 g/dL (ref 6.5–8.1)

## 2016-10-24 LAB — LIPASE, BLOOD: Lipase: 25 U/L (ref 11–51)

## 2016-10-24 LAB — PREGNANCY, URINE: Preg Test, Ur: NEGATIVE

## 2016-10-24 LAB — URINALYSIS, MICROSCOPIC (REFLEX)

## 2016-10-24 MED ORDER — GI COCKTAIL ~~LOC~~
30.0000 mL | Freq: Once | ORAL | Status: AC
  Administered 2016-10-24: 30 mL via ORAL
  Filled 2016-10-24: qty 30

## 2016-10-24 MED ORDER — SUCRALFATE 1 GM/10ML PO SUSP: 1.0000 g | Freq: Three times a day (TID) | ORAL | 0 refills | Status: DC

## 2016-10-24 MED ORDER — POTASSIUM CHLORIDE 20 MEQ PO PACK
PACK | ORAL | Status: AC
  Administered 2016-10-24: 40 meq via ORAL
  Filled 2016-10-24: qty 2

## 2016-10-24 MED ORDER — LANSOPRAZOLE 30 MG PO CPDR: 30.0000 mg | DELAYED_RELEASE_CAPSULE | Freq: Every day | ORAL | 0 refills | Status: DC

## 2016-10-24 MED ORDER — POTASSIUM CHLORIDE 10 MEQ/100ML IV SOLN
10.0000 meq | INTRAVENOUS | Status: DC
  Filled 2016-10-24: qty 100

## 2016-10-24 MED ORDER — POTASSIUM CHLORIDE CRYS ER 20 MEQ PO TBCR
40.0000 meq | EXTENDED_RELEASE_TABLET | Freq: Once | ORAL | Status: AC
  Administered 2016-10-24: 40 meq via ORAL
  Filled 2016-10-24: qty 2

## 2016-10-24 MED ORDER — POTASSIUM CHLORIDE 10 MEQ/100ML IV SOLN
INTRAVENOUS | Status: AC
  Filled 2016-10-24: qty 100

## 2016-10-24 MED ORDER — ONDANSETRON 4 MG PO TBDP
4.0000 mg | ORAL_TABLET | Freq: Once | ORAL | Status: AC
  Administered 2016-10-24: 4 mg via ORAL
  Filled 2016-10-24: qty 1

## 2016-10-24 MED ORDER — POTASSIUM CHLORIDE 20 MEQ PO PACK
40.0000 meq | PACK | Freq: Once | ORAL | Status: AC
  Administered 2016-10-24: 40 meq via ORAL

## 2016-10-24 NOTE — Telephone Encounter (Signed)
Patient called and stated she went to the ER last night, however she left due to the long wait.  I spoke with Magda Paganini, triage nurse for [facility] and asked if she could be on the look out for the patient.  I also instructed the patient to go to the ER as soon as possible and she stated she was on her way.

## 2016-10-24 NOTE — ED Notes (Signed)
Cardiac monitoring 12 Lead

## 2016-10-24 NOTE — Telephone Encounter (Signed)
Pain medication was printed and given to the pt. dexilant rx went to the [facility] in [place]. I have tried twice to get dexilant approved with [place] medicaid and it is telling me that she is not enrolled in the plan. I spoke with the pt about this yesterday when she was in the office. See other phone note.

## 2016-10-24 NOTE — ED Provider Notes (Signed)
[facility] DEPT Provider Note   CSN: [id number] Arrival date & time: 10/24/16  1258     History   Chief Complaint No chief complaint on file.   HPI Jeanne Mcneil is a 30 y.o. female.  HPI Patient's been seen multiple times in the emergency department for similar complaints. She was following up with her gastroenterologist and had lab work done. Labs showed elevation in her white blood cell count. She was referred to the emergency department. Patient states she's had ongoing vomiting which is improved with Phenergan. Has not had a bowel movement in a week. She has ongoing left flank pain and left lower quadrant pain. She states she has ongoing epigastric pain. She is currently her menstrual period. States she gets 2 periods a month. She is not seen OB/GYN in greater than 1 year. She denies any vaginal discharge. Denies urinary symptoms. Past Medical History:  Diagnosis Date  . Anxiety   . Back pain   . Constipation   . Depression   . GERD (gastroesophageal reflux disease)   . Glaucoma of both eyes   . Hematuria   . History of abnormal cervical Pap smear   . History of chronic gastritis   . History of kidney stones   . Hydronephrosis, right   . Renal calculi    bilateral per ct [id number]  . Right ureteral stone   . Urgency of urination   . Uterine fibroid   . Uterine polyp     Patient Active Problem List   Diagnosis Date Noted  . Pyelonephritis 10/11/2016  . Hypokalemia 10/11/2016  . Hyperglycemia 10/11/2016  . Elevated blood pressure reading 10/11/2016  . Left nephrolithiasis 10/11/2016  . Renal colic on left side 36/64/4034  . Generalized anxiety disorder 10/11/2016  . Nausea with vomiting 07/26/2016  . Ureteral stone 07/04/2016  . UTI (urinary tract infection) 07/04/2016  . Hydronephrosis of right kidney 07/04/2016  . GERD (gastroesophageal reflux disease) 07/04/2016  . Ureteral calculi 07/04/2016  . Abdominal pain, chronic, epigastric   . Hematemesis  05/16/2016  . Heme + stool 05/16/2016  . Abdominal pain, epigastric 05/16/2016    Past Surgical History:  Procedure Laterality Date  . CYSTOSCOPY W/ URETERAL STENT PLACEMENT Right 07/04/2016   Procedure: CYSTOSCOPY WITH RIGHT RETROGRADE PYELOGRAM, RIGHT URETERAL STENT PLACEMENT;  Surgeon: Ardis Hughs, MD;  Location: [facility];  Service: Urology;  Laterality: Right;  . CYSTOSCOPY WITH RETROGRADE PYELOGRAM, URETEROSCOPY AND STENT PLACEMENT Left 11/28/2007  . CYSTOSCOPY WITH RETROGRADE PYELOGRAM, URETEROSCOPY AND STENT PLACEMENT Right 08/02/2016   Procedure: CYSTOSCOPY WITH RIGHT  RETROGRADE PYELOGRAM, URETEROSCOPY,  AND STENT EXCHANGE;  Surgeon: Ardis Hughs, MD;  Location: [facility];  Service: Urology;  Laterality: Right;  . ESOPHAGOGASTRODUODENOSCOPY N/A 05/17/2016   Procedure: ESOPHAGOGASTRODUODENOSCOPY (EGD);  Surgeon: Danie Binder, MD;  Location: [facility];  Service: Endoscopy;  Laterality: N/A;  830   . INDUCED ABORTION  2009    OB History    Gravida Para Term Preterm AB Living   3 2 2   1 2    SAB TAB Ectopic Multiple Live Births   1       2       Home Medications    Prior to Admission medications   Medication Sig Start Date End Date Taking? Authorizing Provider  busPIRone (BUSPAR) 5 MG tablet Take 5 mg by mouth 2 (two) times daily.   Yes Historical Provider, MD  ondansetron (ZOFRAN ODT) 8 MG disintegrating tablet Take  1 tablet (8 mg total) by mouth every 8 (eight) hours as needed for nausea. 10/18/16  Yes Varney Biles, MD  oxyCODONE-acetaminophen (PERCOCET/ROXICET) 5-325 MG tablet Take 1 tablet by mouth every 6 (six) hours as needed for severe pain. 10/23/16  Yes Mahala Menghini, PA-C  promethazine (PHENERGAN) 25 MG tablet Take 1 tablet (25 mg total) by mouth every 6 (six) hours as needed for nausea. 10/18/16  Yes Varney Biles, MD  sertraline (ZOLOFT) 50 MG tablet Take 50 mg by mouth every morning.    Yes Historical Provider, MD  ciprofloxacin  (CIPRO) 500 MG tablet Take 1 tablet (500 mg total) by mouth 2 (two) times daily. Patient not taking: Reported on 10/24/2016 10/12/16   Donne Hazel, MD  dexlansoprazole (DEXILANT) 60 MG capsule Take 1 capsule (60 mg total) by mouth daily. Patient not taking: Reported on 10/24/2016 10/23/16   Mahala Menghini, PA-C  lansoprazole (PREVACID) 30 MG capsule Take 1 capsule (30 mg total) by mouth daily at 12 noon. 10/24/16   Julianne Rice, MD  sucralfate (CARAFATE) 1 GM/10ML suspension Take 10 mLs (1 g total) by mouth 4 (four) times daily -  with meals and at bedtime. 10/24/16   Julianne Rice, MD    Family History Family History  Problem Relation Age of Onset  . Colon cancer Paternal Uncle     Passed away 2015/01/20  . Nephrolithiasis Father   . Hypertension Father   . Nephrolithiasis Sister   . Nephrolithiasis Sister   . Nephrolithiasis Sister   . Hypertension Mother     Social History Social History  Substance Use Topics  . Smoking status: Current Every Day Smoker    Packs/day: 0.50    Years: 20.00    Types: Cigarettes  . Smokeless tobacco: Never Used     Comment: 5-7 cigarettes daily   . Alcohol use 2.4 oz/week    4 Shots of liquor per week     Comment: No ETOH in 4 months; Previously: occasionally, once every 2-3 months.     Allergies   Keflex [cephalexin]; Codeine; Tape; and Vicodin [hydrocodone-acetaminophen]   Review of Systems Review of Systems  Constitutional: Negative for chills and fever.  Respiratory: Negative for cough and shortness of breath.   Cardiovascular: Negative for chest pain.  Gastrointestinal: Positive for abdominal pain, constipation, nausea and vomiting. Negative for blood in stool and diarrhea.  Genitourinary: Positive for flank pain and vaginal bleeding. Negative for dysuria, frequency, hematuria and vaginal discharge.  Musculoskeletal: Positive for back pain. Negative for neck pain and neck stiffness.  Skin: Negative for rash and wound.  Neurological:  Negative for dizziness, weakness, light-headedness, numbness and headaches.  All other systems reviewed and are negative.    Physical Exam Updated Vital Signs BP 115/82   Pulse 74   Temp 98.1 F (36.7 C) (Oral)   Resp 19   Ht 5' 2"  (1.575 m)   Wt 133 lb (60.3 kg)   LMP 10/19/2016 (Approximate)   SpO2 98%   BMI 24.33 kg/m   Physical Exam  Constitutional: She is oriented to person, place, and time. She appears well-developed and well-nourished.  Patient is in no distress.  HENT:  Head: Normocephalic and atraumatic.  Mouth/Throat: Oropharynx is clear and moist. No oropharyngeal exudate.  Eyes: EOM are normal. Pupils are equal, round, and reactive to light.  Neck: Normal range of motion. Neck supple.  Cardiovascular: Normal rate and regular rhythm.  Exam reveals no gallop and no friction rub.  No murmur heard. Pulmonary/Chest: Effort normal and breath sounds normal. No respiratory distress. She has no wheezes. She has no rales. She exhibits no tenderness.  Abdominal: Soft. Bowel sounds are normal. There is no tenderness. There is no rebound and no guarding.  Genitourinary: No vaginal discharge found.  Genitourinary Comments: Blood in the vaginal vault. No cervical motion tenderness. Mild tenderness to palpation over the fundus of uterus.  Musculoskeletal: Normal range of motion. She exhibits no edema or tenderness.  Neurological: She is alert and oriented to person, place, and time.  Skin: Skin is warm and dry. No rash noted. No erythema.  Psychiatric: She has a normal mood and affect. Her behavior is normal.  Nursing note and vitals reviewed.    ED Treatments / Results  Labs (all labs ordered are listed, but only abnormal results are displayed) Labs Reviewed  CBC WITH DIFFERENTIAL/PLATELET - Abnormal; Notable for the following:       Result Value   Lymphs Abs 4.5 (*)    Monocytes Absolute 1.1 (*)    All other components within normal limits  COMPREHENSIVE METABOLIC  PANEL - Abnormal; Notable for the following:    Sodium 129 (*)    Potassium 2.5 (*)    Chloride 86 (*)    BUN 21 (*)    All other components within normal limits  URINALYSIS, ROUTINE W REFLEX MICROSCOPIC - Abnormal; Notable for the following:    Color, Urine RED (*)    APPearance TURBID (*)    Hgb urine dipstick LARGE (*)    Ketones, ur 15 (*)    Protein, ur >300 (*)    Nitrite POSITIVE (*)    Leukocytes, UA SMALL (*)    All other components within normal limits  URINALYSIS, MICROSCOPIC (REFLEX) - Abnormal; Notable for the following:    Bacteria, UA MANY (*)    Squamous Epithelial / LPF 0-5 (*)    All other components within normal limits  LIPASE, BLOOD  PREGNANCY, URINE    EKG  EKG Interpretation None       Radiology No results found.  Procedures Procedures (including critical care time)  Medications Ordered in ED Medications  gi cocktail (Maalox,Lidocaine,Donnatal) (30 mLs Oral Given 10/24/16 1701)  ondansetron (ZOFRAN-ODT) disintegrating tablet 4 mg (4 mg Oral Given 10/24/16 1701)  potassium chloride SA (K-DUR,KLOR-CON) CR tablet 40 mEq (40 mEq Oral Given 10/24/16 2056)  potassium chloride (KLOR-CON) packet 40 mEq (40 mEq Oral Provided for home use 10/24/16 2115)     Initial Impression / Assessment and Plan / ED Course  I have reviewed the triage vital signs and the nursing notes.  Pertinent labs & imaging results that were available during my care of the patient were reviewed by me and considered in my medical decision making (see chart for details).  Clinical Course    Ultrasound evidence of fibroid. Blood cell count is normal. Patient does have low potassium and will replace in the emergency department. Urine is been sent for culture. Her abdomen is soft without any peritoneal signs. Do not believe that repeat CT is necessary at this point. I've advised her to stop taking Toradol at all as this is likely worsened her gastritis. She will need to be on a PPI and  follow-up with her gastroenterologist. Patient also need to follow-up with a gynecologist.   Final Clinical Impressions(s) / ED Diagnoses   Final diagnoses:  NSAID induced gastritis  Chronic abdominal pain  Hypokalemia  Uterine leiomyoma, unspecified location  Abnormal  uterine bleeding    New Prescriptions Discharge Medication List as of 10/24/2016  9:29 PM    START taking these medications   Details  lansoprazole (PREVACID) 30 MG capsule Take 1 capsule (30 mg total) by mouth daily at 12 noon., Starting Tue 10/24/2016, Print    sucralfate (CARAFATE) 1 GM/10ML suspension Take 10 mLs (1 g total) by mouth 4 (four) times daily -  with meals and at bedtime., Starting Tue 10/24/2016, Print         Julianne Rice, MD 10/28/16 [phone]

## 2016-10-24 NOTE — ED Notes (Signed)
CRITICAL VALUE ALERT  Critical value received:  Potassium 2.5  Date of notification:  10/24/2016  Time of notification:  19:26  Critical value read back: yes  Nurse who received alert:  Rip Harbour RN   MD notified (1st page):  Dr Lita Mains  Time of first page:  19:26  MD notified (2nd page):  Time of second page:  Responding MD:  Dr Lita Mains  Time MD responded:  19:30

## 2016-10-24 NOTE — ED Notes (Addendum)
Pt refused IV and IV K+ administration- pt able to swallow K+ pills after breaking them in small pieces. Dr Lita Mains aware.

## 2016-10-24 NOTE — Telephone Encounter (Signed)
Pt's mother called the office to say that they were still waiting at the hospital for a room and the patient was getting really aggravated with the wait and wanted to call us to see if we could "speed it up".

## 2016-10-24 NOTE — ED Triage Notes (Signed)
Pt's reports pt was seen by Dr. Nona Dell yesterday and had labs done. Pt was called and told that WBC was 26,000 and told to come to ED. Denies pain. Vomiting x1 this morning.

## 2016-10-24 NOTE — ED Notes (Signed)
Pt was seen by PMD yesterday for chronic abd pain that she states has been ongoing x 8 months. States they sent her for bloodwork yesterday and called today and told to come here for admission and IV antibiotics.

## 2016-10-24 NOTE — Progress Notes (Signed)
Noted  

## 2016-10-24 NOTE — ED Notes (Signed)
Called for triage with no response

## 2016-10-24 NOTE — Discharge Instructions (Signed)
Stop taking Toradol and all NSAIDs. Follow-up with your gastroenterologist. Take potassium as instructed tomorrow morning. Follow-up in 2 days with your primary physician to have potassium rechecked. Follow up with OB/GYN regarding uterine fibroids and irregular menstrual bleeding. Return immediately to the emergency department for persistent vomiting or for any concerns.

## 2016-10-24 NOTE — ED Notes (Signed)
Pt has not vomited after taking potassium pills- tolerating well.

## 2016-10-25 NOTE — Progress Notes (Signed)
cc'ed to pcp °

## 2016-10-25 NOTE — Telephone Encounter (Signed)
Patient was able to be moved back to the ER.

## 2016-11-10 NOTE — Telephone Encounter (Addendum)
If patient is still having GI issues, I would offer patient follow up OV, nonurgent.

## 2016-11-11 NOTE — Progress Notes (Signed)
REVIEWED. PT NEEDS TO SEE MENTAL HEALTH.

## 2016-11-16 ENCOUNTER — Encounter: Payer: Self-pay | Admitting: Gastroenterology

## 2016-11-16 NOTE — Telephone Encounter (Signed)
APPT MADE AND LETTER SENT  °

## 2016-11-16 NOTE — Telephone Encounter (Signed)
This is an [facility] pt.  Please schedule ov.

## 2016-12-07 ENCOUNTER — Ambulatory Visit: Payer: Self-pay | Admitting: Gastroenterology

## 2016-12-29 ENCOUNTER — Ambulatory Visit: Payer: Self-pay | Admitting: Gastroenterology

## 2016-12-29 ENCOUNTER — Telehealth: Payer: Self-pay | Admitting: Gastroenterology

## 2016-12-29 NOTE — Telephone Encounter (Signed)
Patient no show x 3

## 2017-01-11 ENCOUNTER — Encounter (HOSPITAL_COMMUNITY): Payer: Self-pay

## 2017-01-11 ENCOUNTER — Emergency Department (HOSPITAL_COMMUNITY): Payer: Self-pay

## 2017-01-11 ENCOUNTER — Emergency Department (HOSPITAL_COMMUNITY)
Admission: EM | Admit: 2017-01-11 | Discharge: 2017-01-11 | Disposition: A | Discharge status: Home / Self Care | Payer: Self-pay | Attending: Emergency Medicine | Admitting: Emergency Medicine

## 2017-01-11 DIAGNOSIS — R112 Nausea with vomiting, unspecified: Secondary | ICD-10-CM | POA: Insufficient documentation

## 2017-01-11 DIAGNOSIS — F17210 Nicotine dependence, cigarettes, uncomplicated: Secondary | ICD-10-CM | POA: Insufficient documentation

## 2017-01-11 DIAGNOSIS — R1011 Right upper quadrant pain: Secondary | ICD-10-CM

## 2017-01-11 LAB — COMPREHENSIVE METABOLIC PANEL
ALBUMIN: 4.4 g/dL (ref 3.5–5.0)
ALK PHOS: 58 U/L (ref 38–126)
ALT: 20 U/L (ref 14–54)
AST: 23 U/L (ref 15–41)
Anion gap: 12 (ref 5–15)
BUN: 11 mg/dL (ref 6–20)
CALCIUM: 10.1 mg/dL (ref 8.9–10.3)
CHLORIDE: 108 mmol/L (ref 101–111)
CO2: 20 mmol/L — AB (ref 22–32)
CREATININE: 1.01 mg/dL — AB (ref 0.44–1.00)
GFR calc Af Amer: >60 mL/min (ref >60)
GFR calc non Af Amer: >60 mL/min (ref >60)
GLUCOSE: 137 mg/dL — AB (ref 65–99)
Potassium: 3.9 mmol/L (ref 3.5–5.1)
Sodium: 140 mmol/L (ref 135–145)
Total Bilirubin: 0.7 mg/dL (ref 0.3–1.2)
Total Protein: 7.4 g/dL (ref 6.5–8.1)

## 2017-01-11 LAB — LIPASE, BLOOD: Lipase: 21 U/L (ref 11–51)

## 2017-01-11 LAB — CBC
HCT: 41.4 % (ref 36.0–46.0)
Hemoglobin: 13.9 g/dL (ref 12.0–15.0)
MCH: 29.1 pg (ref 26.0–34.0)
MCHC: 33.6 g/dL (ref 30.0–36.0)
MCV: 86.8 fL (ref 78.0–100.0)
PLATELETS: 359 10*3/uL (ref 150–400)
RBC: 4.77 MIL/uL (ref 3.87–5.11)
RDW: 17 % — AB (ref 11.5–15.5)
WBC: 14.2 10*3/uL — ABNORMAL HIGH (ref 4.0–10.5)

## 2017-01-11 LAB — I-STAT BETA HCG BLOOD, ED (MC, WL, AP ONLY): I-stat hCG, quantitative: <5 m[IU]/mL (ref <5)

## 2017-01-11 LAB — URINALYSIS, ROUTINE W REFLEX MICROSCOPIC
Bilirubin Urine: NEGATIVE
GLUCOSE, UA: NEGATIVE mg/dL
KETONES UR: NEGATIVE mg/dL
Nitrite: NEGATIVE
PH: 5 (ref 5.0–8.0)
Protein, ur: 100 mg/dL — AB
Specific Gravity, Urine: 1.027 (ref 1.005–1.030)

## 2017-01-11 MED ORDER — DICYCLOMINE HCL 20 MG PO TABS: 20.0000 mg | ORAL_TABLET | Freq: Two times a day (BID) | ORAL | 0 refills | Status: DC

## 2017-01-11 MED ORDER — METOCLOPRAMIDE HCL 5 MG/ML IJ SOLN
10.0000 mg | Freq: Once | INTRAMUSCULAR | Status: DC
  Filled 2017-01-11: qty 2

## 2017-01-11 MED ORDER — SODIUM CHLORIDE 0.9 % IV BOLUS (SEPSIS)
1000.0000 mL | Freq: Once | INTRAVENOUS | Status: AC
  Administered 2017-01-11: 1000 mL via INTRAVENOUS

## 2017-01-11 MED ORDER — ONDANSETRON 4 MG PO TBDP: 4.0000 mg | ORAL_TABLET | Freq: Three times a day (TID) | ORAL | 0 refills | Status: DC | PRN

## 2017-01-11 MED ORDER — METOCLOPRAMIDE HCL 5 MG/ML IJ SOLN
10.0000 mg | Freq: Once | INTRAMUSCULAR | Status: AC
Start: 2017-01-11 — End: 2017-01-11
  Administered 2017-01-11: 10 mg via INTRAVENOUS

## 2017-01-11 MED ORDER — ONDANSETRON 4 MG PO TBDP
ORAL_TABLET | ORAL | Status: AC
  Filled 2017-01-11: qty 1

## 2017-01-11 MED ORDER — KETOROLAC TROMETHAMINE 30 MG/ML IJ SOLN
15.0000 mg | Freq: Once | INTRAMUSCULAR | Status: AC
  Administered 2017-01-11: 15 mg via INTRAVENOUS
  Filled 2017-01-11: qty 1

## 2017-01-11 MED ORDER — ONDANSETRON 4 MG PO TBDP
4.0000 mg | ORAL_TABLET | Freq: Once | ORAL | Status: AC | PRN
  Administered 2017-01-11: 4 mg via ORAL

## 2017-01-11 MED ORDER — HALOPERIDOL LACTATE 5 MG/ML IJ SOLN
5.0000 mg | Freq: Once | INTRAMUSCULAR | Status: AC
  Administered 2017-01-11: 5 mg via INTRAVENOUS
  Filled 2017-01-11: qty 1

## 2017-01-11 NOTE — ED Notes (Signed)
Pt. Continuously making herself vomit. RN attempting to calm patient down by rubbing her back and talking with her.

## 2017-01-11 NOTE — ED Triage Notes (Signed)
Patient arrived by EMS after developing epigastric pain this am 0300 after eating a meal last night that was spicy and had seeds on it, vomiting pta, thinks related to her diverticulitis or gallbladder, NAD

## 2017-01-11 NOTE — ED Notes (Signed)
Pt came back up to triage stating she was going to vomit, pt then started pacing the floor stating "I'm going to freak out, I cant handle this pain." pt crying and hyperventilating in triage room, stating "its coming" referring to her abdominal pain, pt instructed on slow deep breathing

## 2017-01-11 NOTE — ED Notes (Addendum)
Pt arrives to hallway stretcher via wc with c/o abdominal pain. Pt walks away from provided stretcher toward BR,  BR is occupied so directed to another BR . Pt walks with steady gait where she kneels infront of toilet and puts finger down throat to gag herself. Pt urged not to place finger down throat. Pt making loud gagging sounds without emesis.

## 2017-01-11 NOTE — ED Notes (Signed)
Pt. Refusing [place] until toradol given.

## 2017-01-11 NOTE — ED Notes (Signed)
Pt calm and still while awaiting medication.

## 2017-01-11 NOTE — ED Notes (Signed)
Pt walking at bedside. Making loud moaning noises. States I am giving her a hard time when I urge her to return to bed after administration of medication. States It not helping my pain. You arnt supportive of my pain. Demands to see the MD.

## 2017-01-11 NOTE — ED Provider Notes (Signed)
[facility] DEPT Provider Note   CSN: [id number] Arrival date & time: 01/11/17  1156     History   Chief Complaint Chief Complaint  Patient presents with  . Abdominal Pain    HPI Jeanne Mcneil is a 31 y.o. female.  Jeanne Mcneil is a 31 y.o. Female with history of GERD, chronic gastritis, chronic abdominal pain, kidney stones, depression, anxiety, constipation, and hematuria who presents to the ER complaining of epigastric abdominal pain with associated nausea, vomiting and diarrhea since 3 am this morning. She reports she began feeling bad after eating a a spicy meal last night. She denies previous abdominal surgeries. She reports taking phenergan prior to arrival without relief. She denies fevers, coughing, urinary symptoms, chest pain, SOB, hematemesis, hematochezia, or rashes.    The history is provided by the patient and medical records. No language interpreter was used.  Abdominal Pain   Associated symptoms include diarrhea and nausea. Pertinent negatives include fever, vomiting, dysuria, frequency, hematuria and headaches.    Past Medical History:  Diagnosis Date  . Anxiety   . Back pain   . Constipation   . Depression   . GERD (gastroesophageal reflux disease)   . Glaucoma of both eyes   . Hematuria   . History of abnormal cervical Pap smear   . History of chronic gastritis   . History of kidney stones   . Hydronephrosis, right   . Renal calculi    bilateral per ct [id number]  . Right ureteral stone   . Urgency of urination   . Uterine fibroid   . Uterine polyp     Patient Active Problem List   Diagnosis Date Noted  . Pyelonephritis 10/11/2016  . Hypokalemia 10/11/2016  . Hyperglycemia 10/11/2016  . Elevated blood pressure reading 10/11/2016  . Left nephrolithiasis 10/11/2016  . Renal colic on left side 73/53/2992  . Generalized anxiety disorder 10/11/2016  . Nausea with vomiting 07/26/2016  . Ureteral stone 07/04/2016  . UTI (urinary tract  infection) 07/04/2016  . Hydronephrosis of right kidney 07/04/2016  . GERD (gastroesophageal reflux disease) 07/04/2016  . Ureteral calculi 07/04/2016  . Abdominal pain, chronic, epigastric   . Hematemesis 05/16/2016  . Heme + stool 05/16/2016  . Abdominal pain, epigastric 05/16/2016    Past Surgical History:  Procedure Laterality Date  . CYSTOSCOPY W/ URETERAL STENT PLACEMENT Right 07/04/2016   Procedure: CYSTOSCOPY WITH RIGHT RETROGRADE PYELOGRAM, RIGHT URETERAL STENT PLACEMENT;  Surgeon: Ardis Hughs, MD;  Location: [facility];  Service: Urology;  Laterality: Right;  . CYSTOSCOPY WITH RETROGRADE PYELOGRAM, URETEROSCOPY AND STENT PLACEMENT Left 11/28/2007  . CYSTOSCOPY WITH RETROGRADE PYELOGRAM, URETEROSCOPY AND STENT PLACEMENT Right 08/02/2016   Procedure: CYSTOSCOPY WITH RIGHT  RETROGRADE PYELOGRAM, URETEROSCOPY,  AND STENT EXCHANGE;  Surgeon: Ardis Hughs, MD;  Location: [facility];  Service: Urology;  Laterality: Right;  . ESOPHAGOGASTRODUODENOSCOPY N/A 05/17/2016   Procedure: ESOPHAGOGASTRODUODENOSCOPY (EGD);  Surgeon: Danie Binder, MD;  Location: [facility];  Service: Endoscopy;  Laterality: N/A;  830   . INDUCED ABORTION  2009    OB History    Gravida Para Term Preterm AB Living   3 2 2   1 2    SAB TAB Ectopic Multiple Live Births   1       2       Home Medications    Prior to Admission medications   Medication Sig Start Date End Date Taking? Authorizing Provider  busPIRone (BUSPAR) 5 MG tablet  Take 5 mg by mouth 2 (two) times daily.    Historical Provider, MD  ciprofloxacin (CIPRO) 500 MG tablet Take 1 tablet (500 mg total) by mouth 2 (two) times daily. Patient not taking: Reported on 10/24/2016 10/12/16   Donne Hazel, MD  dexlansoprazole (DEXILANT) 60 MG capsule Take 1 capsule (60 mg total) by mouth daily. Patient not taking: Reported on 10/24/2016 10/23/16   Mahala Menghini, PA-C  lansoprazole (PREVACID) 30 MG capsule Take 1 capsule (30 mg  total) by mouth daily at 12 noon. 10/24/16   Julianne Rice, MD  ondansetron (ZOFRAN ODT) 8 MG disintegrating tablet Take 1 tablet (8 mg total) by mouth every 8 (eight) hours as needed for nausea. 10/18/16   Varney Biles, MD  oxyCODONE-acetaminophen (PERCOCET/ROXICET) 5-325 MG tablet Take 1 tablet by mouth every 6 (six) hours as needed for severe pain. 10/23/16   Mahala Menghini, PA-C  promethazine (PHENERGAN) 25 MG tablet Take 1 tablet (25 mg total) by mouth every 6 (six) hours as needed for nausea. 10/18/16   Varney Biles, MD  sertraline (ZOLOFT) 50 MG tablet Take 50 mg by mouth every morning.     Historical Provider, MD  sucralfate (CARAFATE) 1 GM/10ML suspension Take 10 mLs (1 g total) by mouth 4 (four) times daily -  with meals and at bedtime. 10/24/16   Julianne Rice, MD    Family History Family History  Problem Relation Age of Onset  . Colon cancer Paternal Uncle     Passed away 02-10-15  . Nephrolithiasis Father   . Hypertension Father   . Nephrolithiasis Sister   . Nephrolithiasis Sister   . Nephrolithiasis Sister   . Hypertension Mother     Social History Social History  Substance Use Topics  . Smoking status: Current Every Day Smoker    Packs/day: 0.50    Years: 20.00    Types: Cigarettes  . Smokeless tobacco: Never Used     Comment: 5-7 cigarettes daily   . Alcohol use 2.4 oz/week    4 Shots of liquor per week     Comment: No ETOH in 4 months; Previously: occasionally, once every 2-3 months.     Allergies   Keflex [cephalexin]; Codeine; Tape; and Vicodin [hydrocodone-acetaminophen]   Review of Systems Review of Systems  Constitutional: Negative for chills and fever.  HENT: Negative for congestion and sore throat.   Eyes: Negative for visual disturbance.  Respiratory: Negative for cough and shortness of breath.   Cardiovascular: Negative for chest pain.  Gastrointestinal: Positive for abdominal pain, diarrhea and nausea. Negative for abdominal distention,  blood in stool and vomiting.  Genitourinary: Negative for difficulty urinating, dysuria, flank pain, frequency, hematuria and urgency.  Musculoskeletal: Negative for back pain and neck pain.  Skin: Negative for rash.  Neurological: Negative for headaches.     Physical Exam Updated Vital Signs BP 165/100 (BP Location: Right Arm)   Pulse (!) 50   Temp 97.8 F (36.6 C) (Oral)   Resp 16   Ht 5' 2"  (1.575 m)   Wt 63.5 kg   SpO2 100%   BMI 25.61 kg/m   Physical Exam  Constitutional: She appears well-developed and well-nourished. No distress.  Nontoxic appearing. Retching.  HENT:  Head: Normocephalic and atraumatic.  Mouth/Throat: Oropharynx is clear and moist.  Mucous membranes are moist.  Eyes: Conjunctivae are normal. Pupils are equal, round, and reactive to light. Right eye exhibits no discharge. Left eye exhibits no discharge.  Neck: Neck supple.  Cardiovascular:  Normal rate, regular rhythm, normal heart sounds and intact distal pulses.  Exam reveals no gallop and no friction rub.   No murmur heard. Pulmonary/Chest: Effort normal and breath sounds normal. No respiratory distress. She has no wheezes. She has no rales.  Abdominal: Soft. Bowel sounds are normal. She exhibits no distension and no mass. There is tenderness. There is no rebound and no guarding.  Abdomen is soft. Bowel sounds are present. Patient has right upper quadrant epigastric abdominal tenderness to palpation. No peritoneal signs. No psoas or obturator sign.  Musculoskeletal: She exhibits no edema.  Lymphadenopathy:    She has no cervical adenopathy.  Neurological: She is alert. Coordination normal.  Skin: Skin is warm and dry. Capillary refill takes less than 2 seconds. No rash noted. She is not diaphoretic. No erythema. No pallor.  Psychiatric: She has a normal mood and affect. Her behavior is normal.  Nursing note and vitals reviewed.    ED Treatments / Results  Labs (all labs ordered are listed, but  only abnormal results are displayed) Labs Reviewed  COMPREHENSIVE METABOLIC PANEL - Abnormal; Notable for the following:       Result Value   CO2 20 (*)    Glucose, Bld 137 (*)    Creatinine, Ser 1.01 (*)    All other components within normal limits  CBC - Abnormal; Notable for the following:    WBC 14.2 (*)    RDW 17.0 (*)    All other components within normal limits  URINALYSIS, ROUTINE W REFLEX MICROSCOPIC - Abnormal; Notable for the following:    Color, Urine AMBER (*)    APPearance CLOUDY (*)    Hgb urine dipstick LARGE (*)    Protein, ur 100 (*)    Leukocytes, UA SMALL (*)    Bacteria, UA RARE (*)    Squamous Epithelial / LPF 6-30 (*)    All other components within normal limits  LIPASE, BLOOD  I-STAT BETA HCG BLOOD, ED ([facility], [facility], [facility] ONLY)    EKG  EKG Interpretation None       Radiology No results found.  Procedures Procedures (including critical care time)  Medications Ordered in ED Medications  ondansetron (ZOFRAN-ODT) disintegrating tablet 4 mg (4 mg Oral Given 01/11/17 1219)  sodium chloride 0.9 % bolus 1,000 mL (1,000 mLs Intravenous New Bag/Given 01/11/17 1426)  metoCLOPramide (REGLAN) injection 10 mg (10 mg Intravenous Given 01/11/17 1426)  haloperidol lactate (HALDOL) injection 5 mg (5 mg Intravenous Given 01/11/17 1441)  ketorolac (TORADOL) 30 MG/ML injection 15 mg (15 mg Intravenous Given 01/11/17 1509)     Initial Impression / Assessment and Plan / ED Course  I have reviewed the triage vital signs and the nursing notes.  Pertinent labs & imaging results that were available during my care of the patient were reviewed by me and considered in my medical decision making (see chart for details).    This is a 31 y.o. Female with history of GERD, chronic gastritis, chronic abdominal pain, kidney stones, depression, anxiety, constipation, and hematuria who presents to the ER complaining of epigastric abdominal pain with associated nausea, vomiting and diarrhea  since 3 am this morning. She reports she began feeling bad after eating a a spicy meal last night. She denies previous abdominal surgeries. She reports taking phenergan prior to arrival without relief. On exam the patient is afebrile and nontoxic appearing. She is retching and she was observed by nursing staff placing her fingers down her throat causing her to vomit.  On my exam she is not vomiting. She is repeatedly asking for pain medication. Her abdomen is soft and she has right upper quadrant abdominal tenderness to palpation. No peritoneal signs. Mucous numbers are moist. Pregnancy test is negative. Lipase is within normal limits. CMP shows normal liver enzymes and a creatinine of 1.01. CBC is remarkable for white count of 14,200. We'll obtain right upper quadrant ultrasound for her pain and nausea. Patient reports Reglan was not helping for nausea. EKG shows no evidence of QT prolongation. Will provide with Haldol 5 mg as the patient has now tried Phenergan, Zofran and Reglan for her nausea.  Reevaluation patient reports she is feeling much better, but is still having some abdominal pain despite the Haldol. I discussed that she would not be receiving any narcotics today in the emergency department.  At shift change patient is awaiting ultrasound of her right upper quadrant. Patient care signed out to Avie Echevaria, PA-C at shift change. If patient is able to tolerate by mouth and ultrasound is unremarkable patient can likely be discharged with follow-up by PCP.  Final Clinical Impressions(s) / ED Diagnoses   Final diagnoses:  RUQ abdominal pain  Non-intractable vomiting with nausea, unspecified vomiting type    New Prescriptions New Prescriptions   No medications on file     Waynetta Pean, PA-C 01/11/17 [place], MD 01/11/17 [phone]

## 2017-01-11 NOTE — ED Notes (Signed)
Pt given po fluids and tolerating well.

## 2017-01-11 NOTE — ED Provider Notes (Signed)
Patient care was transferred to me by Sula Rumple, PA-C pending Abdominal US with plan to discharge with Bentyl and zofran if negative after successful PO challenge. Scripts have already been written. Discussed negative results with patient and she requested sprite and was eager to have something to drink. Discharge home with PCP follow up. Return precautions discussed.    Emeline General, PA-C 01/11/17 [place], MD 01/12/17 [phone]

## 2017-01-11 NOTE — ED Notes (Signed)
Pt stat she understands instructions. Home stable with steady gait.

## 2017-01-11 NOTE — ED Notes (Signed)
Pt tearful and states, Im dying!"

## 2017-01-11 NOTE — ED Notes (Addendum)
Patient complaining about her sever pain, wanting to lay in floor, etc. Hyperventilating. zofran given and percocet given due to the pain. Percocet NOT given due to contraindication

## 2017-01-15 ENCOUNTER — Inpatient Hospital Stay (HOSPITAL_COMMUNITY)
Admission: EM | Admit: 2017-01-15 | Discharge: 2017-01-18 | DRG: 683 | Disposition: A | Discharge status: Home / Self Care | Payer: Self-pay | Attending: Family Medicine | Admitting: Family Medicine

## 2017-01-15 ENCOUNTER — Encounter (HOSPITAL_COMMUNITY): Payer: Self-pay | Admitting: Emergency Medicine

## 2017-01-15 ENCOUNTER — Emergency Department (HOSPITAL_COMMUNITY): Payer: Self-pay

## 2017-01-15 DIAGNOSIS — Z881 Allergy status to other antibiotic agents status: Secondary | ICD-10-CM

## 2017-01-15 DIAGNOSIS — E860 Dehydration: Secondary | ICD-10-CM | POA: Diagnosis present

## 2017-01-15 DIAGNOSIS — G43A0 Cyclical vomiting, not intractable: Secondary | ICD-10-CM | POA: Diagnosis present

## 2017-01-15 DIAGNOSIS — F1290 Cannabis use, unspecified, uncomplicated: Secondary | ICD-10-CM | POA: Diagnosis present

## 2017-01-15 DIAGNOSIS — R6510 Systemic inflammatory response syndrome (SIRS) of non-infectious origin without acute organ dysfunction: Secondary | ICD-10-CM | POA: Diagnosis present

## 2017-01-15 DIAGNOSIS — F319 Bipolar disorder, unspecified: Secondary | ICD-10-CM | POA: Diagnosis present

## 2017-01-15 DIAGNOSIS — R739 Hyperglycemia, unspecified: Secondary | ICD-10-CM | POA: Diagnosis present

## 2017-01-15 DIAGNOSIS — N202 Calculus of kidney with calculus of ureter: Secondary | ICD-10-CM | POA: Diagnosis present

## 2017-01-15 DIAGNOSIS — N179 Acute kidney failure, unspecified: Principal | ICD-10-CM | POA: Diagnosis present

## 2017-01-15 DIAGNOSIS — Z79899 Other long term (current) drug therapy: Secondary | ICD-10-CM

## 2017-01-15 DIAGNOSIS — E871 Hypo-osmolality and hyponatremia: Secondary | ICD-10-CM | POA: Diagnosis present

## 2017-01-15 DIAGNOSIS — R079 Chest pain, unspecified: Secondary | ICD-10-CM | POA: Diagnosis present

## 2017-01-15 DIAGNOSIS — H409 Unspecified glaucoma: Secondary | ICD-10-CM | POA: Diagnosis present

## 2017-01-15 DIAGNOSIS — K210 Gastro-esophageal reflux disease with esophagitis: Secondary | ICD-10-CM | POA: Diagnosis present

## 2017-01-15 DIAGNOSIS — E876 Hypokalemia: Secondary | ICD-10-CM | POA: Diagnosis present

## 2017-01-15 DIAGNOSIS — D72829 Elevated white blood cell count, unspecified: Secondary | ICD-10-CM | POA: Diagnosis present

## 2017-01-15 DIAGNOSIS — R030 Elevated blood-pressure reading, without diagnosis of hypertension: Secondary | ICD-10-CM | POA: Diagnosis present

## 2017-01-15 DIAGNOSIS — R112 Nausea with vomiting, unspecified: Secondary | ICD-10-CM | POA: Diagnosis present

## 2017-01-15 DIAGNOSIS — K219 Gastro-esophageal reflux disease without esophagitis: Secondary | ICD-10-CM | POA: Diagnosis present

## 2017-01-15 DIAGNOSIS — Z91048 Other nonmedicinal substance allergy status: Secondary | ICD-10-CM

## 2017-01-15 DIAGNOSIS — G8929 Other chronic pain: Secondary | ICD-10-CM | POA: Diagnosis present

## 2017-01-15 DIAGNOSIS — I4581 Long QT syndrome: Secondary | ICD-10-CM | POA: Diagnosis present

## 2017-01-15 DIAGNOSIS — N201 Calculus of ureter: Secondary | ICD-10-CM

## 2017-01-15 DIAGNOSIS — R1010 Upper abdominal pain, unspecified: Secondary | ICD-10-CM | POA: Diagnosis present

## 2017-01-15 DIAGNOSIS — Z885 Allergy status to narcotic agent status: Secondary | ICD-10-CM

## 2017-01-15 DIAGNOSIS — F17210 Nicotine dependence, cigarettes, uncomplicated: Secondary | ICD-10-CM | POA: Diagnosis present

## 2017-01-15 LAB — COMPREHENSIVE METABOLIC PANEL
ALT: 26 U/L (ref 14–54)
AST: 46 U/L — ABNORMAL HIGH (ref 15–41)
Albumin: 5.8 g/dL — ABNORMAL HIGH (ref 3.5–5.0)
Alkaline Phosphatase: 80 U/L (ref 38–126)
Anion gap: 20 — ABNORMAL HIGH (ref 5–15)
BUN: 50 mg/dL — AB (ref 6–20)
CHLORIDE: 82 mmol/L — AB (ref 101–111)
CO2: 26 mmol/L (ref 22–32)
Calcium: 9.8 mg/dL (ref 8.9–10.3)
Creatinine, Ser: 3.44 mg/dL — ABNORMAL HIGH (ref 0.44–1.00)
GFR calc Af Amer: 19 mL/min — ABNORMAL LOW (ref >60)
GFR, EST NON AFRICAN AMERICAN: 17 mL/min — AB (ref >60)
Glucose, Bld: 139 mg/dL — ABNORMAL HIGH (ref 65–99)
POTASSIUM: 2.7 mmol/L — AB (ref 3.5–5.1)
SODIUM: 128 mmol/L — AB (ref 135–145)
Total Bilirubin: 1.1 mg/dL (ref 0.3–1.2)
Total Protein: 9.5 g/dL — ABNORMAL HIGH (ref 6.5–8.1)

## 2017-01-15 LAB — URINALYSIS, ROUTINE W REFLEX MICROSCOPIC
BILIRUBIN URINE: NEGATIVE
Glucose, UA: NEGATIVE mg/dL
Ketones, ur: NEGATIVE mg/dL
Nitrite: NEGATIVE
PROTEIN: 100 mg/dL — AB
Specific Gravity, Urine: 1.017 (ref 1.005–1.030)
pH: 5 (ref 5.0–8.0)

## 2017-01-15 LAB — CBC
HEMATOCRIT: 44.6 % (ref 36.0–46.0)
Hemoglobin: 16.1 g/dL — ABNORMAL HIGH (ref 12.0–15.0)
MCH: 29.2 pg (ref 26.0–34.0)
MCHC: 36.1 g/dL — ABNORMAL HIGH (ref 30.0–36.0)
MCV: 80.8 fL (ref 78.0–100.0)
Platelets: 378 10*3/uL (ref 150–400)
RBC: 5.52 MIL/uL — AB (ref 3.87–5.11)
RDW: 15.5 % (ref 11.5–15.5)
WBC: 17.8 10*3/uL — AB (ref 4.0–10.5)

## 2017-01-15 LAB — LIPASE, BLOOD: LIPASE: 17 U/L (ref 11–51)

## 2017-01-15 LAB — POC URINE PREG, ED: Preg Test, Ur: NEGATIVE

## 2017-01-15 MED ORDER — PROMETHAZINE HCL 25 MG/ML IJ SOLN
25.0000 mg | Freq: Four times a day (QID) | INTRAMUSCULAR | Status: DC | PRN
  Administered 2017-01-15 – 2017-01-17 (×5): 25 mg via INTRAVENOUS
  Filled 2017-01-15 (×6): qty 1

## 2017-01-15 MED ORDER — METOCLOPRAMIDE HCL 10 MG PO TABS
10.0000 mg | ORAL_TABLET | Freq: Four times a day (QID) | ORAL | Status: DC | PRN
  Administered 2017-01-16: 10 mg via ORAL
  Filled 2017-01-15: qty 1

## 2017-01-15 MED ORDER — GI COCKTAIL ~~LOC~~
30.0000 mL | Freq: Once | ORAL | Status: AC
  Administered 2017-01-15: 30 mL via ORAL
  Filled 2017-01-15: qty 30

## 2017-01-15 MED ORDER — SODIUM CHLORIDE 0.9 % IV BOLUS (SEPSIS)
1000.0000 mL | Freq: Once | INTRAVENOUS | Status: AC
  Administered 2017-01-15: 1000 mL via INTRAVENOUS

## 2017-01-15 MED ORDER — ONDANSETRON HCL 4 MG/2ML IJ SOLN
4.0000 mg | Freq: Four times a day (QID) | INTRAMUSCULAR | Status: DC | PRN
  Administered 2017-01-17: 4 mg via INTRAVENOUS
  Filled 2017-01-15: qty 2

## 2017-01-15 MED ORDER — SODIUM CHLORIDE 0.9 % IV SOLN
INTRAVENOUS | Status: DC
  Administered 2017-01-15 – 2017-01-16 (×2): via INTRAVENOUS

## 2017-01-15 MED ORDER — SUCRALFATE 1 GM/10ML PO SUSP
1.0000 g | Freq: Three times a day (TID) | ORAL | Status: DC
  Administered 2017-01-15: 1 g via ORAL
  Filled 2017-01-15: qty 10

## 2017-01-15 MED ORDER — PANTOPRAZOLE SODIUM 40 MG PO TBEC
40.0000 mg | DELAYED_RELEASE_TABLET | Freq: Two times a day (BID) | ORAL | Status: DC
  Administered 2017-01-15 – 2017-01-18 (×6): 40 mg via ORAL
  Filled 2017-01-15 (×6): qty 1

## 2017-01-15 MED ORDER — ALBUTEROL SULFATE (2.5 MG/3ML) 0.083% IN NEBU: 2.5000 mg | INHALATION_SOLUTION | RESPIRATORY_TRACT | Status: DC | PRN

## 2017-01-15 MED ORDER — ONDANSETRON HCL 4 MG PO TABS: 4.0000 mg | ORAL_TABLET | Freq: Four times a day (QID) | ORAL | Status: DC | PRN

## 2017-01-15 MED ORDER — SODIUM CHLORIDE 0.9 % IV SOLN
30.0000 meq | Freq: Once | INTRAVENOUS | Status: DC
  Filled 2017-01-15: qty 15

## 2017-01-15 MED ORDER — POTASSIUM CHLORIDE CRYS ER 20 MEQ PO TBCR
40.0000 meq | EXTENDED_RELEASE_TABLET | ORAL | Status: AC
  Administered 2017-01-15: 40 meq via ORAL
  Filled 2017-01-15: qty 2

## 2017-01-15 MED ORDER — ONDANSETRON HCL 4 MG/2ML IJ SOLN
4.0000 mg | Freq: Once | INTRAMUSCULAR | Status: AC
  Administered 2017-01-15: 4 mg via INTRAVENOUS
  Filled 2017-01-15: qty 2

## 2017-01-15 MED ORDER — HEPARIN SODIUM (PORCINE) 5000 UNIT/ML IJ SOLN
5000.0000 [IU] | Freq: Three times a day (TID) | INTRAMUSCULAR | Status: DC
  Administered 2017-01-15 – 2017-01-17 (×6): 5000 [IU] via SUBCUTANEOUS
  Filled 2017-01-15 (×6): qty 1

## 2017-01-15 MED ORDER — POTASSIUM CHLORIDE 10 MEQ/100ML IV SOLN
10.0000 meq | INTRAVENOUS | Status: AC
  Administered 2017-01-15 – 2017-01-16 (×6): 10 meq via INTRAVENOUS
  Filled 2017-01-15 (×6): qty 100

## 2017-01-15 MED ORDER — BOOST / RESOURCE BREEZE PO LIQD
1.0000 | Freq: Three times a day (TID) | ORAL | Status: DC
  Administered 2017-01-16 – 2017-01-18 (×3): 1 via ORAL

## 2017-01-15 MED ORDER — ACETAMINOPHEN 325 MG PO TABS
650.0000 mg | ORAL_TABLET | Freq: Four times a day (QID) | ORAL | Status: DC | PRN
  Administered 2017-01-17: 650 mg via ORAL
  Filled 2017-01-15: qty 2

## 2017-01-15 MED ORDER — ACETAMINOPHEN 650 MG RE SUPP: 650.0000 mg | Freq: Four times a day (QID) | RECTAL | Status: DC | PRN

## 2017-01-15 MED ORDER — TRAMADOL HCL 50 MG PO TABS
50.0000 mg | ORAL_TABLET | Freq: Once | ORAL | Status: AC
  Administered 2017-01-15: 50 mg via ORAL
  Filled 2017-01-15: qty 1

## 2017-01-15 MED ORDER — POTASSIUM CHLORIDE 10 MEQ/100ML IV SOLN
10.0000 meq | INTRAVENOUS | Status: DC
  Filled 2017-01-15 (×3): qty 100

## 2017-01-15 NOTE — ED Provider Notes (Addendum)
[facility] DEPT Provider Note   CSN: [id number] Arrival date & time: 01/15/17  1634     History   Chief Complaint Chief Complaint  Patient presents with  . Generalized Body Aches  . Emesis    HPI Jeanne Mcneil is a 31 y.o. female.  She complains of muscle cramps in multiple locations which started about 3 days ago and has been getting worse. She rates pain at 8/10. During that same time, she has had persistent nausea and vomiting to the point where she is unable to eat or drink anything at all. She denies fever, chills, sweats. She denies diarrhea. She had been in the ED 4 days ago for nausea and vomiting. She has not been able to take anything at home because of inability to even hold down water.   The history is provided by the patient.  Emesis      Past Medical History:  Diagnosis Date  . Anxiety   . Back pain   . Constipation   . Depression   . GERD (gastroesophageal reflux disease)   . Glaucoma of both eyes   . Hematuria   . History of abnormal cervical Pap smear   . History of chronic gastritis   . History of kidney stones   . Hydronephrosis, right   . Renal calculi    bilateral per ct [id number]  . Right ureteral stone   . Urgency of urination   . Uterine fibroid   . Uterine polyp     Patient Active Problem List   Diagnosis Date Noted  . Pyelonephritis 10/11/2016  . Hypokalemia 10/11/2016  . Hyperglycemia 10/11/2016  . Elevated blood pressure reading 10/11/2016  . Left nephrolithiasis 10/11/2016  . Renal colic on left side 40/06/6760  . Generalized anxiety disorder 10/11/2016  . Nausea with vomiting 07/26/2016  . Ureteral stone 07/04/2016  . UTI (urinary tract infection) 07/04/2016  . Hydronephrosis of right kidney 07/04/2016  . GERD (gastroesophageal reflux disease) 07/04/2016  . Ureteral calculi 07/04/2016  . Abdominal pain, chronic, epigastric   . Hematemesis 05/16/2016  . Heme + stool 05/16/2016  . Abdominal pain, epigastric 05/16/2016     Past Surgical History:  Procedure Laterality Date  . CYSTOSCOPY W/ URETERAL STENT PLACEMENT Right 07/04/2016   Procedure: CYSTOSCOPY WITH RIGHT RETROGRADE PYELOGRAM, RIGHT URETERAL STENT PLACEMENT;  Surgeon: Ardis Hughs, MD;  Location: [facility];  Service: Urology;  Laterality: Right;  . CYSTOSCOPY WITH RETROGRADE PYELOGRAM, URETEROSCOPY AND STENT PLACEMENT Left 11/28/2007  . CYSTOSCOPY WITH RETROGRADE PYELOGRAM, URETEROSCOPY AND STENT PLACEMENT Right 08/02/2016   Procedure: CYSTOSCOPY WITH RIGHT  RETROGRADE PYELOGRAM, URETEROSCOPY,  AND STENT EXCHANGE;  Surgeon: Ardis Hughs, MD;  Location: [facility];  Service: Urology;  Laterality: Right;  . ESOPHAGOGASTRODUODENOSCOPY N/A 05/17/2016   Procedure: ESOPHAGOGASTRODUODENOSCOPY (EGD);  Surgeon: Danie Binder, MD;  Location: [facility];  Service: Endoscopy;  Laterality: N/A;  830   . INDUCED ABORTION  2009    OB History    Gravida Para Term Preterm AB Living   3 2 2   1 2    SAB TAB Ectopic Multiple Live Births   1       2       Home Medications    Prior to Admission medications   Medication Sig Start Date End Date Taking? Authorizing Provider  busPIRone (BUSPAR) 5 MG tablet Take 5 mg by mouth 2 (two) times daily.    Historical Provider, MD  ciprofloxacin (CIPRO) 500 MG  tablet Take 1 tablet (500 mg total) by mouth 2 (two) times daily. Patient not taking: Reported on 10/24/2016 10/12/16   Donne Hazel, MD  dexlansoprazole (DEXILANT) 60 MG capsule Take 1 capsule (60 mg total) by mouth daily. Patient not taking: Reported on 10/24/2016 10/23/16   Mahala Menghini, PA-C  dicyclomine (BENTYL) 20 MG tablet Take 1 tablet (20 mg total) by mouth 2 (two) times daily. 01/11/17   Waynetta Pean, PA-C  lansoprazole (PREVACID) 30 MG capsule Take 1 capsule (30 mg total) by mouth daily at 12 noon. 10/24/16   Julianne Rice, MD  ondansetron (ZOFRAN ODT) 4 MG disintegrating tablet Take 1 tablet (4 mg total) by mouth every 8  (eight) hours as needed for nausea or vomiting. 01/11/17   Waynetta Pean, PA-C  oxyCODONE-acetaminophen (PERCOCET/ROXICET) 5-325 MG tablet Take 1 tablet by mouth every 6 (six) hours as needed for severe pain. 10/23/16   Mahala Menghini, PA-C  promethazine (PHENERGAN) 25 MG tablet Take 1 tablet (25 mg total) by mouth every 6 (six) hours as needed for nausea. 10/18/16   Varney Biles, MD  sertraline (ZOLOFT) 50 MG tablet Take 50 mg by mouth every morning.     Historical Provider, MD  sucralfate (CARAFATE) 1 GM/10ML suspension Take 10 mLs (1 g total) by mouth 4 (four) times daily -  with meals and at bedtime. 10/24/16   Julianne Rice, MD    Family History Family History  Problem Relation Age of Onset  . Colon cancer Paternal Uncle     Passed away 2015/02/07  . Nephrolithiasis Father   . Hypertension Father   . Nephrolithiasis Sister   . Nephrolithiasis Sister   . Nephrolithiasis Sister   . Hypertension Mother     Social History Social History  Substance Use Topics  . Smoking status: Current Every Day Smoker    Packs/day: 0.50    Years: 20.00    Types: Cigarettes  . Smokeless tobacco: Never Used     Comment: 5-7 cigarettes daily   . Alcohol use 2.4 oz/week    4 Shots of liquor per week     Comment: No ETOH in 4 months; Previously: occasionally, once every 2-3 months.     Allergies   Keflex [cephalexin]; Codeine; Tape; and Vicodin [hydrocodone-acetaminophen]   Review of Systems Review of Systems  Gastrointestinal: Positive for vomiting.  All other systems reviewed and are negative.    Physical Exam Updated Vital Signs BP (!) 131/103   Pulse 114   Temp 98 F (36.7 C) (Oral)   Resp 16   SpO2 95%   Physical Exam  Nursing note and vitals reviewed.  31 year old female, resting comfortably and in no acute distress. Vital signs are Significant for tachycardia and hypertension. Oxygen saturation is 95%, which is normal. Head is normocephalic and atraumatic. PERRLA, EOMI.  Oropharynx is clear. Neck is nontender and supple without adenopathy or JVD. Back is nontender and there is no CVA tenderness. Lungs are clear without rales, wheezes, or rhonchi. Chest is nontender. Heart has regular rate and rhythm without murmur. Abdomen is soft, flat, with moderate tenderness across the upper abdomen. There is no rebound or guarding. There are no masses or hepatosplenomegaly and peristalsis is hypoactive. Extremities have no cyanosis or edema, full range of motion is present. Skin is warm and dry without rash. Neurologic: Mental status is normal, cranial nerves are intact, there are no motor or sensory deficits.  ED Treatments / Results  Labs (all labs ordered  are listed, but only abnormal results are displayed) Labs Reviewed  COMPREHENSIVE METABOLIC PANEL - Abnormal; Notable for the following:       Result Value   Sodium 128 (*)    Potassium 2.7 (*)    Chloride 82 (*)    Glucose, Bld 139 (*)    BUN 50 (*)    Creatinine, Ser 3.44 (*)    Total Protein 9.5 (*)    Albumin 5.8 (*)    AST 46 (*)    GFR calc non Af Amer 17 (*)    GFR calc Af Amer 19 (*)    Anion gap 20 (*)    All other components within normal limits  CBC - Abnormal; Notable for the following:    WBC 17.8 (*)    RBC 5.52 (*)    Hemoglobin 16.1 (*)    MCHC 36.1 (*)    All other components within normal limits  LIPASE, BLOOD  URINALYSIS, ROUTINE W REFLEX MICROSCOPIC  POC URINE PREG, ED    EKG  EKG Interpretation  Date/Time:  Monday January 15 2017 20:04:31 EST Ventricular Rate:  77 PR Interval:    QRS Duration: 87 QT Interval:  425 QTC Calculation: 481 R Axis:   85 Text Interpretation:  Sinus rhythm Right atrial enlargement Borderline prolonged QT interval When compared with ECG of 01/11/2017, No significant change was found Confirmed by [facility]  MD, Kimesha Claxton ([id number]) on 01/15/2017 8:08:08 PM       Radiology Ct Abdomen Pelvis Wo Contrast  Result Date: 01/15/2017 CLINICAL DATA:   Abdominal pain and distension.  Nausea and vomiting. EXAM: CT ABDOMEN AND PELVIS WITHOUT CONTRAST TECHNIQUE: Multidetector CT imaging of the abdomen and pelvis was performed following the standard protocol without IV contrast. COMPARISON:  Right upper quadrant ultrasound 01/11/2017, most recent comparison CT 11/10/2016 FINDINGS: Lower chest: The lung bases are clear. Hepatobiliary: Focal fatty infiltration adjacent with falciform ligament. Gallbladder physiologically distended, no calcified stone. No biliary dilatation. Pancreas: No ductal dilatation or inflammation. Spleen: Normal in size without focal abnormality. Adrenals/Urinary Tract: 5 x 6 mm stone in the left renal pelvis, without hydronephrosis or perinephric edema. Punctate stones in the lower left kidney. Simple cyst in the upper left kidney was better assessed on prior contrast-enhanced CT. No right renal stones or obstructive uropathy. Urinary bladder is minimally distended without bladder stone. Normal adrenal glands. Stomach/Bowel: Small hiatal hernia. Stomach is decompressed. No evidence of bowel wall thickening, distention or inflammation. Minimal submucosal fatty infiltration throughout the colon can be seen with chronic inflammation, no acute inflammatory change at this time. Previously described sigmoid colonic diverticula are not well visualized on the current exam. Normal appendix. Vascular/Lymphatic: No significant vascular findings are present. No enlarged abdominal or pelvic lymph nodes. Reproductive: There is a 3.2 cm cyst in the right adnexa, likely physiologic. Left ovaries normal in size. Uterus appears retroverted with probable fibroid. Other: No free air, free fluid, or intra-abdominal fluid collection. Tiny fat containing umbilical hernia. Musculoskeletal: There are no acute or suspicious osseous abnormalities. Unilateral left L5 pars defect. IMPRESSION: 1. Stone in the left renal pelvis measures 6 mm, no frank hydronephrosis or  perinephric edema and to suggest obstructive change. Additional nonobstructing stones in the lower left kidney. 2. A 3.2 cm right ovarian cyst is likely physiologic. Electronically Signed   By: Jeb Levering M.D.   On: 01/15/2017 19:24    Procedures Procedures (including critical care time)  Medications Ordered in ED Medications  potassium chloride 30 mEq  in sodium chloride 0.9 % 265 mL (KCL MULTIRUN) IVPB (not administered)  sodium chloride 0.9 % bolus 1,000 mL (not administered)  ondansetron (ZOFRAN) injection 4 mg (not administered)  gi cocktail (Maalox,Lidocaine,Donnatal) (not administered)     Initial Impression / Assessment and Plan / ED Course  I have reviewed the triage vital signs and the nursing notes.  Pertinent labs & imaging results that were available during my care of the patient were reviewed by me and considered in my medical decision making (see chart for details).  Nausea and vomiting with generalized cramping. Laboratory workup had been initiated prior to my seeing the patient, and she has severe hypokalemia with potassium 2.7. This probably accounts for her vomiting. Also, creatinine and BUN are significantly elevated BUN 50, creatinine 3.44. On February 22, BUN was 11, and creatinine 1.01. She will need to be admitted for acute renal failure which is probably related to dehydration. She started on IV hydration and IV potassium replacement. Because of persistent upper abdominal pain, she will be sent for CT of abdomen and pelvis. Old records are reviewed, confirming ED visit 4 days ago for nausea and vomiting and abdominal pain. Abdominal ultrasound at that point was unremarkable. Last abdominal CT scan was on November 22 which did show presence of diverticuli without diverticulosis, and a left renal calculus.  CT scan shows calculus is now in the proximal ureter but without evidence of hydronephrosis. This may be because of her renal failure and not producing  significant amount of urine to cause hydronephrosis. No other acute finding on abdominal CT. Case is discussed with Dr. Tamala Julian [facility] who agrees to admit the patient.  Final Clinical Impressions(s) / ED Diagnoses   Final diagnoses:  Acute renal failure, unspecified acute renal failure type ([place])  Intractable vomiting with nausea, unspecified vomiting type  Hypokalemia  Hyponatremia  Upper abdominal pain  Ureterolithiasis    New Prescriptions New Prescriptions   No medications on file     Delora Fuel, MD 41/42/39 [id number]    Delora Fuel, MD 23/34/35 [id number]

## 2017-01-15 NOTE — H&P (Signed)
History and Physical    DAWT REEB VZC:[id number] DOB: 29-Aug-1986 DOA: 01/15/2017  Referring MD/NP/PA: Peggye Pitt PCP: Philis Fendt, MD  Patient coming from: Home  Chief Complaint: Cramps  HPI: Jeanne Mcneil is a 30 y.o. female with medical history significant of chronic abdominal pain, nephrolithiasis, renal colic, and GERD; who presents with complaints of cramps for the last 4 days. Patient reports complaints ofcramps that appear to have been occurring throughout her whole body. For the same period in time she reports nausea and vomiting. Unable to keep any food or liquids down.  Tried taking home Phenergan and Zofran without relief of symptoms. Reports associated symptoms of cold sweats, chills, cough, shortness of breath, back/abdominal pain, and decreased urine output. Denies any fever, headache, loss of consciousness, diarrhea, or dysuria. Review of records shows patient had an EGD performed by Dr. Barney Drain in 04/2016 which revealed grade a esophagitis with mild gastritis and medium-size hiatal hernia. Her nausea and vomiting and abdominal pain symptoms were thought to be related to reflux. For her history of kidney stones she sees Dr. Louis Meckel of urology.   ED Course: Upon admission into the emergency department patient was found to be afebrile, pulse 114, respirations 16, blood pressure 131/103, and O2 saturations maintained. Lab work revealed WBC 17.8, hemoglobin 16.1, sodium 128, potassium 2.7, chloride 82, CO2 26, BUN 50, creatinine 3.44. Urinalysis positive for rare bacteria small leukocytes TNTC rbc, 6-30 squamous epithelial cells, and TNTC WBCs. CT scan of the abdomen with stone in the left renal pelvis measuring 6 mm without frank hydronephrosis or edema and a 3.2 right ovarian cyst  was likely physiologic. Patient was given 1 L IV fluids, Zofran, and GI cocktail.   .Review of Systems: As per HPI otherwise 10 point review of systems negative.   Past Medical History:    Diagnosis Date  . Anxiety   . Back pain   . Constipation   . Depression   . GERD (gastroesophageal reflux disease)   . Glaucoma of both eyes   . Hematuria   . History of abnormal cervical Pap smear   . History of chronic gastritis   . History of kidney stones   . Hydronephrosis, right   . Renal calculi    bilateral per ct [id number]  . Right ureteral stone   . Urgency of urination   . Uterine fibroid   . Uterine polyp     Past Surgical History:  Procedure Laterality Date  . CYSTOSCOPY W/ URETERAL STENT PLACEMENT Right 07/04/2016   Procedure: CYSTOSCOPY WITH RIGHT RETROGRADE PYELOGRAM, RIGHT URETERAL STENT PLACEMENT;  Surgeon: Ardis Hughs, MD;  Location: [facility];  Service: Urology;  Laterality: Right;  . CYSTOSCOPY WITH RETROGRADE PYELOGRAM, URETEROSCOPY AND STENT PLACEMENT Left 11/28/2007  . CYSTOSCOPY WITH RETROGRADE PYELOGRAM, URETEROSCOPY AND STENT PLACEMENT Right 08/02/2016   Procedure: CYSTOSCOPY WITH RIGHT  RETROGRADE PYELOGRAM, URETEROSCOPY,  AND STENT EXCHANGE;  Surgeon: Ardis Hughs, MD;  Location: [facility];  Service: Urology;  Laterality: Right;  . ESOPHAGOGASTRODUODENOSCOPY N/A 05/17/2016   Procedure: ESOPHAGOGASTRODUODENOSCOPY (EGD);  Surgeon: Danie Binder, MD;  Location: [facility];  Service: Endoscopy;  Laterality: N/A;  830   . INDUCED ABORTION  2009     reports that she has been smoking Cigarettes.  She has a 10.00 pack-year smoking history. She has never used smokeless tobacco. She reports that she drinks about 2.4 oz of alcohol per week . She reports that she does not  use drugs.  Allergies  Allergen Reactions  . Keflex [Cephalexin] Other (See Comments)    Bladder spasms  . Codeine Hives, Itching and Other (See Comments)    Able to take Dilaudid and Ultram.  . Tape Other (See Comments)    Regular tape burns skin can only use paper tape  . Vicodin [Hydrocodone-Acetaminophen] Hives and Itching    Family History  Problem  Relation Age of Onset  . Colon cancer Paternal Uncle     Passed away 02-13-15  . Nephrolithiasis Father   . Hypertension Father   . Nephrolithiasis Sister   . Nephrolithiasis Sister   . Nephrolithiasis Sister   . Hypertension Mother     Prior to Admission medications   Medication Sig Start Date End Date Taking? Authorizing Provider  busPIRone (BUSPAR) 5 MG tablet Take 5 mg by mouth 2 (two) times daily.    Historical Provider, MD  ciprofloxacin (CIPRO) 500 MG tablet Take 1 tablet (500 mg total) by mouth 2 (two) times daily. Patient not taking: Reported on 10/24/2016 10/12/16   Donne Hazel, MD  dexlansoprazole (DEXILANT) 60 MG capsule Take 1 capsule (60 mg total) by mouth daily. Patient not taking: Reported on 10/24/2016 10/23/16   Mahala Menghini, PA-C  dicyclomine (BENTYL) 20 MG tablet Take 1 tablet (20 mg total) by mouth 2 (two) times daily. 01/11/17   Waynetta Pean, PA-C  lansoprazole (PREVACID) 30 MG capsule Take 1 capsule (30 mg total) by mouth daily at 12 noon. 10/24/16   Julianne Rice, MD  ondansetron (ZOFRAN ODT) 4 MG disintegrating tablet Take 1 tablet (4 mg total) by mouth every 8 (eight) hours as needed for nausea or vomiting. 01/11/17   Waynetta Pean, PA-C  oxyCODONE-acetaminophen (PERCOCET/ROXICET) 5-325 MG tablet Take 1 tablet by mouth every 6 (six) hours as needed for severe pain. 10/23/16   Mahala Menghini, PA-C  promethazine (PHENERGAN) 25 MG tablet Take 1 tablet (25 mg total) by mouth every 6 (six) hours as needed for nausea. 10/18/16   Varney Biles, MD  sertraline (ZOLOFT) 50 MG tablet Take 50 mg by mouth every morning.     Historical Provider, MD  sucralfate (CARAFATE) 1 GM/10ML suspension Take 10 mLs (1 g total) by mouth 4 (four) times daily -  with meals and at bedtime. 10/24/16   Julianne Rice, MD    Physical Exam:   Constitutional: NAD, calm, comfortable Vitals:   01/15/17 1638 01/15/17 1717  BP:  (!) 131/103  Pulse:  114  Resp:  16  Temp:  98 F (36.7 C)    TempSrc:  Oral  SpO2: 96% 95%   Eyes: PERRL, lids and conjunctivae normal ENMT: Mucous membranes are moist. Posterior pharynx clear of any exudate or lesions.Normal dentition.  Neck: normal, supple, no masses, no thyromegaly Respiratory: clear to auscultation bilaterally, no wheezing, no crackles. Normal respiratory effort. No accessory muscle use.  Cardiovascular: Regular rate and rhythm, no murmurs / rubs / gallops. No extremity edema. 2+ pedal pulses. No carotid bruits.  Abdomen: no tenderness, no masses palpated. No hepatosplenomegaly. Bowel sounds positive.  Musculoskeletal: no clubbing / cyanosis. No joint deformity upper and lower extremities. Good ROM, no contractures. Normal muscle tone.  Skin: no rashes, lesions, ulcers. No induration Neurologic: CN 2-12 grossly intact. Sensation intact, DTR normal. Strength 5/5 in all 4.  Psychiatric: Normal judgment and insight. Alert and oriented x 3. Normal mood.     Labs on Admission: I have personally reviewed following labs and imaging studies  CBC:  Recent Labs Lab 01/11/17 1204 01/15/17 1735  WBC 14.2* 17.8*  HGB 13.9 16.1*  HCT 41.4 44.6  MCV 86.8 80.8  PLT 359 267   Basic Metabolic Panel:  Recent Labs Lab 01/11/17 1204 01/15/17 1735  NA 140 128*  K 3.9 2.7*  CL 108 82*  CO2 20* 26  GLUCOSE 137* 139*  BUN 11 50*  CREATININE 1.01* 3.44*  CALCIUM 10.1 9.8   GFR: Estimated Creatinine Clearance: 20.6 mL/min (by C-G formula based on SCr of 3.44 mg/dL (H)). Liver Function Tests:  Recent Labs Lab 01/11/17 1204 01/15/17 1735  AST 23 46*  ALT 20 26  ALKPHOS 58 80  BILITOT 0.7 1.1  PROT 7.4 9.5*  ALBUMIN 4.4 5.8*    Recent Labs Lab 01/11/17 1204 01/15/17 1735  LIPASE 21 17   No results for input(s): AMMONIA in the last 168 hours. Coagulation Profile: No results for input(s): INR, PROTIME in the last 168 hours. Cardiac Enzymes: No results for input(s): CKTOTAL, CKMB, CKMBINDEX, TROPONINI in the last  168 hours. BNP (last 3 results) No results for input(s): PROBNP in the last 8760 hours. HbA1C: No results for input(s): HGBA1C in the last 72 hours. CBG: No results for input(s): GLUCAP in the last 168 hours. Lipid Profile: No results for input(s): CHOL, HDL, LDLCALC, TRIG, CHOLHDL, LDLDIRECT in the last 72 hours. Thyroid Function Tests: No results for input(s): TSH, T4TOTAL, FREET4, T3FREE, THYROIDAB in the last 72 hours. Anemia Panel: No results for input(s): VITAMINB12, FOLATE, FERRITIN, TIBC, IRON, RETICCTPCT in the last 72 hours. Urine analysis:    Component Value Date/Time   COLORURINE AMBER (A) 01/11/2017 1205   APPEARANCEUR CLOUDY (A) 01/11/2017 1205   LABSPEC 1.027 01/11/2017 1205   PHURINE 5.0 01/11/2017 1205   GLUCOSEU NEGATIVE 01/11/2017 1205   HGBUR LARGE (A) 01/11/2017 1205   BILIRUBINUR NEGATIVE 01/11/2017 1205   KETONESUR NEGATIVE 01/11/2017 1205   PROTEINUR 100 (A) 01/11/2017 1205   UROBILINOGEN 0.2 07/02/2015 1757   NITRITE NEGATIVE 01/11/2017 1205   LEUKOCYTESUR SMALL (A) 01/11/2017 1205   Sepsis Labs: No results found for this or any previous visit (from the past 240 hour(s)).   Radiological Exams on Admission: Ct Abdomen Pelvis Wo Contrast  Result Date: 01/15/2017 CLINICAL DATA:  Abdominal pain and distension.  Nausea and vomiting. EXAM: CT ABDOMEN AND PELVIS WITHOUT CONTRAST TECHNIQUE: Multidetector CT imaging of the abdomen and pelvis was performed following the standard protocol without IV contrast. COMPARISON:  Right upper quadrant ultrasound 01/11/2017, most recent comparison CT 11/10/2016 FINDINGS: Lower chest: The lung bases are clear. Hepatobiliary: Focal fatty infiltration adjacent with falciform ligament. Gallbladder physiologically distended, no calcified stone. No biliary dilatation. Pancreas: No ductal dilatation or inflammation. Spleen: Normal in size without focal abnormality. Adrenals/Urinary Tract: 5 x 6 mm stone in the left renal pelvis,  without hydronephrosis or perinephric edema. Punctate stones in the lower left kidney. Simple cyst in the upper left kidney was better assessed on prior contrast-enhanced CT. No right renal stones or obstructive uropathy. Urinary bladder is minimally distended without bladder stone. Normal adrenal glands. Stomach/Bowel: Small hiatal hernia. Stomach is decompressed. No evidence of bowel wall thickening, distention or inflammation. Minimal submucosal fatty infiltration throughout the colon can be seen with chronic inflammation, no acute inflammatory change at this time. Previously described sigmoid colonic diverticula are not well visualized on the current exam. Normal appendix. Vascular/Lymphatic: No significant vascular findings are present. No enlarged abdominal or pelvic lymph nodes. Reproductive: There is a 3.2  cm cyst in the right adnexa, likely physiologic. Left ovaries normal in size. Uterus appears retroverted with probable fibroid. Other: No free air, free fluid, or intra-abdominal fluid collection. Tiny fat containing umbilical hernia. Musculoskeletal: There are no acute or suspicious osseous abnormalities. Unilateral left L5 pars defect. IMPRESSION: 1. Stone in the left renal pelvis measures 6 mm, no frank hydronephrosis or perinephric edema and to suggest obstructive change. Additional nonobstructing stones in the lower left kidney. 2. A 3.2 cm right ovarian cyst is likely physiologic. Electronically Signed   By: Jeb Levering M.D.   On: 01/15/2017 19:24    EKG: Independently reviewed. Sinus rhythm without significant change from previous EKG  Assessment/Plan Intractable nausea and vomiting: Acute on chronic. Previous toxicology screens are positive for marijuana patient has had EGD by GI who thought reflux most likely cause of symptoms. Question exact cause at this time. On differential would include reflux versus cyclic vomiting secondary to marijuana abuse versus withdrawal pain medications  versus gastroenteritis. - Admit to a telemetry bed and - Zofran/Phenergan prn N/V - Clear liquid diet, advance as tolerated - May warrant further workup  Muscle cramps 2/2 Hypokalemia: Patient's initial potassium noted to be 2.7 on admission. - Give potassium chloride 60 mEq IV and 40 mEq by mouth  - Recheck potassium and Magnesium level  in a.m.   Acute renal failure secondary to dehydration: Baseline creatinine previously noted to be within normal limits. Patient presents with a creatinine of 3.44, and a BUN of 50. Given history of nausea and vomiting as likely cause. Imaging studies showed no obstructive cause for symptoms. Ordered to be bolused 2 L of IV fluids in the ED. - IV fluids NS at 131m/hr overnight - Recheck BMP in a.m.  Leukocytosis/Sirs: Acute. WBC elevated at 17.8 on admission. - Follow-up repeat CBC in a.m.   Abnormal UA: Urinalysis is possibly contaminated. - Follow-up urine culture   Transaminitis: Mild elevation of AST to 46. Suspected acute elevation could be related to patient's dehydration although patient admits to drinking alcohol intermittently.  - Recheck CMP in a.m.   Hyponatremia: Sodium 128 on admission. Secondary to nausea and vomiting.  - Continue to monitor   GJerrye Bushywith esophagitis: Patient does not appear to be on any reflux medication. Previously on Carafate and lansoprazole. - protonix  GERD with esophagitis: Patie DVT prophylaxis: Heparin   Code Status: Full Family Communication:  No family present at bedside Disposition Plan:none  Admission status: observation  RNorval MortonMD [facility] Pager [phone] If 7PM-7AM, please contact night-coverage [email or web address] Password [facility] 01/15/2017, 7:32 PM

## 2017-01-15 NOTE — ED Triage Notes (Signed)
PT RECEIVED FROM HOME VIA EMS C/O COUGH, WEAKNESS, N/V, AND ABDOMINAL DISTENTION SINCE LAST WEEK. PT STS SHE WAS SEEN HERE LAST WEEK FOR DIVERTICULITIS. DENIES FEVER.

## 2017-01-15 NOTE — ED Triage Notes (Signed)
Pt reports generalized body cramping, weakness, and emesis for the past 4 days.

## 2017-01-15 NOTE — ED Triage Notes (Signed)
Pt refuse triage standing orders zofran

## 2017-01-16 DIAGNOSIS — E871 Hypo-osmolality and hyponatremia: Secondary | ICD-10-CM | POA: Diagnosis present

## 2017-01-16 DIAGNOSIS — R112 Nausea with vomiting, unspecified: Secondary | ICD-10-CM

## 2017-01-16 DIAGNOSIS — N179 Acute kidney failure, unspecified: Secondary | ICD-10-CM | POA: Diagnosis present

## 2017-01-16 DIAGNOSIS — D72829 Elevated white blood cell count, unspecified: Secondary | ICD-10-CM | POA: Diagnosis present

## 2017-01-16 LAB — COMPREHENSIVE METABOLIC PANEL
ALBUMIN: 3.8 g/dL (ref 3.5–5.0)
ALT: 19 U/L (ref 14–54)
ANION GAP: 10 (ref 5–15)
AST: 31 U/L (ref 15–41)
Alkaline Phosphatase: 54 U/L (ref 38–126)
BUN: 33 mg/dL — AB (ref 6–20)
CHLORIDE: 95 mmol/L — AB (ref 101–111)
CO2: 26 mmol/L (ref 22–32)
Calcium: 8.6 mg/dL — ABNORMAL LOW (ref 8.9–10.3)
Creatinine, Ser: 1.44 mg/dL — ABNORMAL HIGH (ref 0.44–1.00)
GFR calc Af Amer: 55 mL/min — ABNORMAL LOW (ref >60)
GFR calc non Af Amer: 47 mL/min — ABNORMAL LOW (ref >60)
GLUCOSE: 101 mg/dL — AB (ref 65–99)
POTASSIUM: 2.8 mmol/L — AB (ref 3.5–5.1)
Sodium: 131 mmol/L — ABNORMAL LOW (ref 135–145)
Total Bilirubin: 1.1 mg/dL (ref 0.3–1.2)
Total Protein: 6.4 g/dL — ABNORMAL LOW (ref 6.5–8.1)

## 2017-01-16 LAB — CBC
HCT: 34.7 % — ABNORMAL LOW (ref 36.0–46.0)
Hemoglobin: 12 g/dL (ref 12.0–15.0)
MCH: 28.5 pg (ref 26.0–34.0)
MCHC: 34.6 g/dL (ref 30.0–36.0)
MCV: 82.4 fL (ref 78.0–100.0)
PLATELETS: 250 10*3/uL (ref 150–400)
RBC: 4.21 MIL/uL (ref 3.87–5.11)
RDW: 15.9 % — AB (ref 11.5–15.5)
WBC: 9.8 10*3/uL (ref 4.0–10.5)

## 2017-01-16 LAB — MAGNESIUM: MAGNESIUM: 2.5 mg/dL — AB (ref 1.7–2.4)

## 2017-01-16 MED ORDER — RANITIDINE HCL 150 MG/10ML PO SYRP
150.0000 mg | ORAL_SOLUTION | Freq: Every day | ORAL | Status: DC
  Administered 2017-01-16 – 2017-01-17 (×2): 150 mg via ORAL
  Filled 2017-01-16 (×2): qty 10

## 2017-01-16 MED ORDER — ALUM & MAG HYDROXIDE-SIMETH 200-200-20 MG/5ML PO SUSP
20.0000 mL | Freq: Once | ORAL | Status: AC
  Administered 2017-01-16: 20 mL via ORAL
  Filled 2017-01-16: qty 30

## 2017-01-16 MED ORDER — ALUM & MAG HYDROXIDE-SIMETH 200-200-25 MG PO CHEW
1.0000 | CHEWABLE_TABLET | Freq: Once | ORAL | Status: DC
  Filled 2017-01-16: qty 1

## 2017-01-16 MED ORDER — TAB-A-VITE/IRON PO TABS
1.0000 | ORAL_TABLET | Freq: Every day | ORAL | Status: DC
  Administered 2017-01-17 – 2017-01-18 (×2): 1 via ORAL
  Filled 2017-01-16 (×3): qty 1

## 2017-01-16 MED ORDER — METOCLOPRAMIDE HCL 10 MG PO TABS
10.0000 mg | ORAL_TABLET | Freq: Three times a day (TID) | ORAL | Status: DC
  Administered 2017-01-16 – 2017-01-18 (×8): 10 mg via ORAL
  Filled 2017-01-16 (×8): qty 1

## 2017-01-16 MED ORDER — SODIUM CHLORIDE 0.9 % IV SOLN
INTRAVENOUS | Status: DC
  Administered 2017-01-16 – 2017-01-17 (×2): via INTRAVENOUS
  Filled 2017-01-16 (×5): qty 1000

## 2017-01-16 MED ORDER — FAMOTIDINE 40 MG/5ML PO SUSR
20.0000 mg | Freq: Every day | ORAL | Status: DC
  Filled 2017-01-16: qty 2.5

## 2017-01-16 NOTE — Progress Notes (Signed)
PROGRESS NOTE    Jeanne Mcneil  QZE:[id number] DOB: 25-Sep-1986 DOA: 01/15/2017 PCP: Philis Fendt, MD  Outpatient Specialists:     Brief Narrative:  31 year old female Known history of cyclical vomiting Nephrolithiasis with admission 09/2016 Chronic abdominal pain Hyperglycemia Elevated blood pressure without a diagnosis of hypertension Bipolar on medication Multiple emergency room visits  Admitted 01/15/2017 with sinus tach cardia acute kidney injury BUN/creatinine 50/3.4 Hypokalemia 2.2 Concerns for sepsis physiology with WBC of 17 and mild transaminitis Hyponatremia 120  Assessment & Plan:   Principal Problem:   Nausea and vomiting Active Problems:   GERD (gastroesophageal reflux disease)   Hypokalemia   Acute renal failure (HCC)   Hyponatremia   Leukocytosis   Nausea vomiting in setting of cyclical vomiting-note that this is probably secondary to marijuana use. Also has a history of reflux. This seems to be resolved tolerating diet-continue Reglan 10 mg 3 times a day at bedtime and GI cocktail. Will need outpatient management Acute kidney injury-Baseline creatinine 3.4 currently 1.4 continue IV saline repletion continue diet Hypokalemia sodium 128 on admission currently 1 31 monitoring Hypokalemia replacing potassium in IV fluid saline 100 with KCl 40 mEq in the infusion.  Monitor labs in a.m. Reflux continue Protonix 40 twice a day-if persisting symptoms may need to add stepup therapy and an H2 blocker as well Chronic Abdominal pain-would not give any further narcotics as this can small bowel and this was explained to the patient clearly     Consultants:   none  Procedures:   none  Antimicrobials:   none    Subjective:  fair 1 episode vomit in am No cp/n/ now Did not eat much overnight  Objective: Vitals:   01/16/17 0529 01/16/17 0830 01/16/17 1340 01/16/17 1501  BP: 118/77 (!) 133/109 (!) 149/92 (!) 164/88  Pulse: 81 75 71 72  Resp: 18  18 18    Temp: 97.3 F (36.3 C) 97.5 F (36.4 C) 99.3 F (37.4 C)   TempSrc: Oral Oral Oral   SpO2: 97% 99% 98%   Weight:      Height:        Intake/Output Summary (Last 24 hours) at 01/16/17 1545 Last data filed at 01/16/17 0900  Gross per 24 hour  Intake          1381.67 ml  Output              400 ml  Net           981.67 ml   Filed Weights   01/15/17 2252  Weight: 60.5 kg (133 lb 6.4 oz)    Examination:  General exam: Appears calm and comfortable  Respiratory system: Clear to auscultation. Respiratory effort normal. Cardiovascular system: S1 & S2 heard, RRR. No JVD, murmurs, rubs, gallops or clicks. No pedal edema. Gastrointestinal system: Abdomen is nondistended, soft and nontender Central nervous system: Alert and oriented. No focal neurological deficits. Extremities: Symmetric 5 x 5 power. Skin: No rashes, lesions or ulcers Psychiatry: Judgement and insight appear normal. Mood & affect appropriate.     Data Reviewed: I have personally reviewed following labs and imaging studies  CBC:  Recent Labs Lab 01/11/17 1204 01/15/17 1735 01/16/17 0519  WBC 14.2* 17.8* 9.8  HGB 13.9 16.1* 12.0  HCT 41.4 44.6 34.7*  MCV 86.8 80.8 82.4  PLT 359 378 226   Basic Metabolic Panel:  Recent Labs Lab 01/11/17 1204 01/15/17 1735 01/16/17 0519  NA 140 128* 131*  K 3.9 2.7* 2.8*  CL 108 82* 95*  CO2 20* 26 26  GLUCOSE 137* 139* 101*  BUN 11 50* 33*  CREATININE 1.01* 3.44* 1.44*  CALCIUM 10.1 9.8 8.6*  MG  --   --  2.5*   GFR: Estimated Creatinine Clearance: 48.1 mL/min (by C-G formula based on SCr of 1.44 mg/dL (H)). Liver Function Tests:  Recent Labs Lab 01/11/17 1204 01/15/17 1735 01/16/17 0519  AST 23 46* 31  ALT 20 26 19   ALKPHOS 58 80 54  BILITOT 0.7 1.1 1.1  PROT 7.4 9.5* 6.4*  ALBUMIN 4.4 5.8* 3.8    Recent Labs Lab 01/11/17 1204 01/15/17 1735  LIPASE 21 17   No results for input(s): AMMONIA in the last 168 hours. Coagulation  Profile: No results for input(s): INR, PROTIME in the last 168 hours. Cardiac Enzymes: No results for input(s): CKTOTAL, CKMB, CKMBINDEX, TROPONINI in the last 168 hours. BNP (last 3 results) No results for input(s): PROBNP in the last 8760 hours. HbA1C: No results for input(s): HGBA1C in the last 72 hours. CBG: No results for input(s): GLUCAP in the last 168 hours. Lipid Profile: No results for input(s): CHOL, HDL, LDLCALC, TRIG, CHOLHDL, LDLDIRECT in the last 72 hours. Thyroid Function Tests: No results for input(s): TSH, T4TOTAL, FREET4, T3FREE, THYROIDAB in the last 72 hours. Anemia Panel: No results for input(s): VITAMINB12, FOLATE, FERRITIN, TIBC, IRON, RETICCTPCT in the last 72 hours. Urine analysis:    Component Value Date/Time   COLORURINE YELLOW 01/15/2017 1719   APPEARANCEUR CLOUDY (A) 01/15/2017 1719   LABSPEC 1.017 01/15/2017 1719   PHURINE 5.0 01/15/2017 1719   GLUCOSEU NEGATIVE 01/15/2017 1719   HGBUR LARGE (A) 01/15/2017 1719   BILIRUBINUR NEGATIVE 01/15/2017 1719   KETONESUR NEGATIVE 01/15/2017 1719   PROTEINUR 100 (A) 01/15/2017 1719   UROBILINOGEN 0.2 07/02/2015 1757   NITRITE NEGATIVE 01/15/2017 1719   LEUKOCYTESUR SMALL (A) 01/15/2017 1719   Sepsis Labs: @LABRCNTIP (procalcitonin:4,lacticidven:4)  )No results found for this or any previous visit (from the past 240 hour(s)).       Radiology Studies: Ct Abdomen Pelvis Wo Contrast  Result Date: 01/15/2017 CLINICAL DATA:  Abdominal pain and distension.  Nausea and vomiting. EXAM: CT ABDOMEN AND PELVIS WITHOUT CONTRAST TECHNIQUE: Multidetector CT imaging of the abdomen and pelvis was performed following the standard protocol without IV contrast. COMPARISON:  Right upper quadrant ultrasound 01/11/2017, most recent comparison CT 11/10/2016 FINDINGS: Lower chest: The lung bases are clear. Hepatobiliary: Focal fatty infiltration adjacent with falciform ligament. Gallbladder physiologically distended, no  calcified stone. No biliary dilatation. Pancreas: No ductal dilatation or inflammation. Spleen: Normal in size without focal abnormality. Adrenals/Urinary Tract: 5 x 6 mm stone in the left renal pelvis, without hydronephrosis or perinephric edema. Punctate stones in the lower left kidney. Simple cyst in the upper left kidney was better assessed on prior contrast-enhanced CT. No right renal stones or obstructive uropathy. Urinary bladder is minimally distended without bladder stone. Normal adrenal glands. Stomach/Bowel: Small hiatal hernia. Stomach is decompressed. No evidence of bowel wall thickening, distention or inflammation. Minimal submucosal fatty infiltration throughout the colon can be seen with chronic inflammation, no acute inflammatory change at this time. Previously described sigmoid colonic diverticula are not well visualized on the current exam. Normal appendix. Vascular/Lymphatic: No significant vascular findings are present. No enlarged abdominal or pelvic lymph nodes. Reproductive: There is a 3.2 cm cyst in the right adnexa, likely physiologic. Left ovaries normal in size. Uterus appears retroverted with probable fibroid. Other: No free air, free fluid,  or intra-abdominal fluid collection. Tiny fat containing umbilical hernia. Musculoskeletal: There are no acute or suspicious osseous abnormalities. Unilateral left L5 pars defect. IMPRESSION: 1. Stone in the left renal pelvis measures 6 mm, no frank hydronephrosis or perinephric edema and to suggest obstructive change. Additional nonobstructing stones in the lower left kidney. 2. A 3.2 cm right ovarian cyst is likely physiologic. Electronically Signed   By: Jeb Levering M.D.   On: 01/15/2017 19:24        Scheduled Meds: . feeding supplement  1 Container Oral TID BM  . heparin  5,000 Units Subcutaneous Q8H  . metoCLOPramide  10 mg Oral TID AC & HS  . multivitamins with iron  1 tablet Oral Daily  . pantoprazole  40 mg Oral BID    Continuous Infusions: . sodium chloride 0.9 % 1,000 mL with potassium chloride 40 mEq infusion 100 mL/hr at 01/16/17 1049     LOS: 0 days    Time spent: [place], MD [facility] ([phone]   If 7PM-7AM, please contact night-coverage [email or web address] Password [facility] 01/16/2017, 3:45 PM

## 2017-01-16 NOTE — Progress Notes (Signed)
Initial Nutrition Assessment  DOCUMENTATION CODES:   Not applicable  INTERVENTION:  - Continue Boost Breeze TID, each supplement provides 250 kcal and 9 grams of protein - Will order daily multivitamin with minerals.  - Continue to encourage PO intakes. - RD will monitor for additional nutrition-related needs.  NUTRITION DIAGNOSIS:   Inadequate oral intake related to acute illness, nausea, vomiting, poor appetite as evidenced by per patient/family report.  GOAL:   Patient will meet greater than or equal to 90% of their needs  MONITOR:   PO intake, Supplement acceptance, Diet advancement, Weight trends, Labs, I & O's  REASON FOR ASSESSMENT:   Malnutrition Screening Tool  ASSESSMENT:   31 y.o. female with medical history significant of chronic abdominal pain, nephrolithiasis, renal colic, and GERD; who presents with complaints of cramps for the last 4 days. Patient reports complaints ofcramps that appear to have been occurring throughout her whole body. For the same period in time she reports nausea and vomiting. Unable to keep any food or liquids down.  Tried taking home Phenergan and Zofran without relief of symptoms. Reports associated symptoms of cold sweats, chills, cough, shortness of breath, back/abdominal pain, and decreased urine output. Denies any fever, headache, loss of consciousness, diarrhea, or dysuria. Review of records shows patient had an EGD performed by Dr. Barney Drain in 04/2016 which revealed grade a esophagitis with mild gastritis and medium-size hiatal hernia. Her nausea and vomiting and abdominal pain symptoms were thought to be related to reflux.  Pt seen for MST. BMI indicates normal weight. Diet advanced from CLD to FLD at [facility] today with on documented intakes. Pt reports she had extreme nausea with associated vomiting when she attempted to have breakfast so she did not try to have anything for lunch. Pt was discussed and visited during rounds this AM and she  reported ongoing N/V/abdominal pain x9 months that intermittently flares and that most recent flare has been ongoing x4 days. She was using marijuana, not on a daily basis, x4-5 months PTA but did not use it in the 4 days PTA. Pt reported that she had vomited last night and again this AM. She was using Zofran and Phenergan at home.  During RD visit this afternoon pt reported that current flare has been ongoing x4 days and that during that time she was unable to keep down any foods or drinks. She reports hx of diverticulitis, GERD, and hypercalcemia and she avoids acidic items such as juice and tomato sauce as well as nuts and seeds. Pt reports that two previous flares that were similar to this resulted in her being unable to eat or drink x18 days and x32 days.   She states that her UBW is 160 lbs and that she last weighed this 4 days ago. This is inconsistent with weight hx in the chart which shows CBW is consistent with weight from September-December 2017. Pt preferred that physical assessment not be performed at this time d/t extreme nausea. Pt chugged a glass of water and then expressed "see how fast I drank that, that is going to make me sick." Encouraged pt to take slow sips to avoid exacerbating nausea or self-inducing vomiting.   Medications reviewed; 20 mL Maalox x1 dose today, 10 mg oral Reglan TID, PRN Zofran, 40 mg oral Protonix BID, 10 mEq IV KCl x6 runs yesterday, 40 mEq oral KCl x1 dose yesterday, PRN IV Phenergan. Labs reviewed; Na: 131 mmol/L, K: 2.8 mmol/L, Cl: 95 mmol/L, BUN: 33 mg/dL, creatinine: 1.44  mg/dL, Ca: 8.6 mg/dL, Mg: 2.5 mg/dL, GFR: 47 mL/min.   IVF: NS @ 100 mL/hr.     Diet Order:  Diet full liquid Room service appropriate? Yes; Fluid consistency: Thin  Skin:  Reviewed, no issues  Last BM:  PTA/unknown  Height:   Ht Readings from Last 1 Encounters:  01/15/17 5' 2"  (1.575 m)    Weight:   Wt Readings from Last 1 Encounters:  01/15/17 133 lb 6.4 oz (60.5 kg)     Ideal Body Weight:  50 kg  BMI:  Body mass index is 24.4 kg/m.  Estimated Nutritional Needs:   Kcal:  2831-5176 (25-28 kcal/kg)  Protein:  55-65 grams  Fluid:  >/= 2 L/day  EDUCATION NEEDS:   No education needs identified at this time    Jeanne Matin, MS, RD, LDN, CNSC Inpatient Clinical Dietitian Pager # [phone] After hours/weekend pager # [phone]

## 2017-01-16 NOTE — Progress Notes (Signed)
Patient stated that she had difficulty swallowing and was nauseated a great deal of the time. PCP on call was notified

## 2017-01-17 LAB — BASIC METABOLIC PANEL
Anion gap: 5 (ref 5–15)
BUN: 19 mg/dL (ref 6–20)
CO2: 27 mmol/L (ref 22–32)
Calcium: 8.4 mg/dL — ABNORMAL LOW (ref 8.9–10.3)
Chloride: 103 mmol/L (ref 101–111)
Creatinine, Ser: 0.87 mg/dL (ref 0.44–1.00)
GFR calc Af Amer: >60 mL/min (ref >60)
GFR calc non Af Amer: >60 mL/min (ref >60)
Glucose, Bld: 112 mg/dL — ABNORMAL HIGH (ref 65–99)
Potassium: 3.6 mmol/L (ref 3.5–5.1)
Sodium: 135 mmol/L (ref 135–145)

## 2017-01-17 LAB — URINE CULTURE: Culture: NO GROWTH

## 2017-01-17 LAB — CBC
HCT: 33.9 % — ABNORMAL LOW (ref 36.0–46.0)
Hemoglobin: 11.1 g/dL — ABNORMAL LOW (ref 12.0–15.0)
MCH: 28.3 pg (ref 26.0–34.0)
MCHC: 32.7 g/dL (ref 30.0–36.0)
MCV: 86.5 fL (ref 78.0–100.0)
Platelets: 239 10*3/uL (ref 150–400)
RBC: 3.92 MIL/uL (ref 3.87–5.11)
RDW: 16.3 % — ABNORMAL HIGH (ref 11.5–15.5)
WBC: 6.6 10*3/uL (ref 4.0–10.5)

## 2017-01-17 MED ORDER — OXYCODONE HCL 5 MG PO TABS
5.0000 mg | ORAL_TABLET | ORAL | Status: DC | PRN
  Administered 2017-01-17: 5 mg via ORAL
  Filled 2017-01-17: qty 1

## 2017-01-17 MED ORDER — HYDROCODONE-ACETAMINOPHEN 5-325 MG PO TABS: 1.0000 | ORAL_TABLET | ORAL | Status: DC | PRN

## 2017-01-17 NOTE — Progress Notes (Signed)
Pt ate about 50% of breakfast and became nauseated, she did not vomited.  She states she continues to feel nauseated. Will give her Reglan as ordered. SRP, RN

## 2017-01-17 NOTE — Progress Notes (Signed)
Pt c/o diaphoresis during the night. She needed to change gown 3 X.  Pt also stated that her menses ended on Tue the 20th and she started bleeding again during the night.

## 2017-01-17 NOTE — Progress Notes (Signed)
Pt complaining of increase pain abd area, level 10, MD notified, orders received. SRP, RN

## 2017-01-17 NOTE — Progress Notes (Signed)
PROGRESS NOTE    CARLEA BADOUR  HYW:[id number] DOB: 1986/10/18 DOA: 01/15/2017 PCP: Philis Fendt, MD  Outpatient Specialists:     Brief Narrative:  31 year old female Known history of cyclical vomiting Nephrolithiasis with admission 09/2016 Chronic abdominal pain Hyperglycemia Elevated blood pressure without a diagnosis of hypertension Bipolar on medication Multiple emergency room visits  Admitted 01/15/2017 with sinus tach cardia acute kidney injury BUN/creatinine 50/3.4 Hypokalemia 2.2 Concerns for sepsis physiology with WBC of 17 and mild transaminitis Hyponatremia 120  Assessment & Plan:   Principal Problem:   Nausea and vomiting Active Problems:   GERD (gastroesophageal reflux disease)   Hypokalemia   Chest pain   Acute renal failure (HCC)   Hyponatremia   Leukocytosis   Nausea vomiting in setting of cyclical vomiting-note that this is probably secondary to marijuana use. Also has a history of reflux. Did not tolerate reg diet 2/28--continue Reglan 10 mg 3 times a day at bedtime and GI cocktail. Will need outpatient follow-up Acute kidney injury-Baseline creatinine 3.4 currently 1.4  And has nomralized-saline locked iv 2.28.18 Hyponatremia sodium 128 on admission and has resolved Hypokalemia replacing potassium in IV fluid saline 100 with KCl 40 mEq in the infusion.  Monitor labs in a.m. Reflux continue Protonix 40 twice a day-if persisting symptoms may need to add stepup therapy and an H2 blocker as well Chronic Abdominal pain-would not give any further narcotics as this can small bowel and this was explained to the patient clearly     Consultants:   none  Procedures:   none  Antimicrobials:   none    Subjective:  Fair but some ause after breakfast and needing zofran in addition to reglan  Objective: Vitals:   01/16/17 1340 01/16/17 1501 01/16/17 2137 01/17/17 0646  BP: (!) 149/92 (!) 164/88 117/85 129/76  Pulse: 71 72 64 61  Resp: 18   18 18   Temp: 99.3 F (37.4 C)  98.7 F (37.1 C) 98.1 F (36.7 C)  TempSrc: Oral  Oral Oral  SpO2: 98%  95% 100%  Weight:      Height:        Intake/Output Summary (Last 24 hours) at 01/17/17 1235 Last data filed at 01/17/17 0600  Gross per 24 hour  Intake          2158.33 ml  Output                0 ml  Net          2158.33 ml   Filed Weights   01/15/17 2252  Weight: 60.5 kg (133 lb 6.4 oz)    Examination:  General exam: Appears calm and comfortable  Respiratory system: Clear to auscultation. Respiratory effort normal. Cardiovascular system: S1 & S2 heard, RRR. No JVD, murmurs, rubs, gallops or clicks. No pedal edema. Gastrointestinal system: Abdomen is nondistended, soft and nontender Central nervous system: Alert and oriented. No focal neurological deficits. Extremities: Symmetric 5 x 5 power. Skin: No rashes, lesions or ulcers Psychiatry: Judgement and insight appear normal. Mood & affect appropriate.     Data Reviewed: I have personally reviewed following labs and imaging studies  CBC:  Recent Labs Lab 01/11/17 1204 01/15/17 1735 01/16/17 0519 01/17/17 0533  WBC 14.2* 17.8* 9.8 6.6  HGB 13.9 16.1* 12.0 11.1*  HCT 41.4 44.6 34.7* 33.9*  MCV 86.8 80.8 82.4 86.5  PLT 359 378 250 485   Basic Metabolic Panel:  Recent Labs Lab 01/11/17 1204 01/15/17 1735 01/16/17 0519 01/17/17 0533  NA 140 128* 131* 135  K 3.9 2.7* 2.8* 3.6  CL 108 82* 95* 103  CO2 20* 26 26 27   GLUCOSE 137* 139* 101* 112*  BUN 11 50* 33* 19  CREATININE 1.01* 3.44* 1.44* 0.87  CALCIUM 10.1 9.8 8.6* 8.4*  MG  --   --  2.5*  --    GFR: Estimated Creatinine Clearance: 79.6 mL/min (by C-G formula based on SCr of 0.87 mg/dL). Liver Function Tests:  Recent Labs Lab 01/11/17 1204 01/15/17 1735 01/16/17 0519  AST 23 46* 31  ALT 20 26 19   ALKPHOS 58 80 54  BILITOT 0.7 1.1 1.1  PROT 7.4 9.5* 6.4*  ALBUMIN 4.4 5.8* 3.8    Recent Labs Lab 01/11/17 1204 01/15/17 1735  LIPASE  21 17   No results for input(s): AMMONIA in the last 168 hours. Coagulation Profile: No results for input(s): INR, PROTIME in the last 168 hours. Cardiac Enzymes: No results for input(s): CKTOTAL, CKMB, CKMBINDEX, TROPONINI in the last 168 hours. BNP (last 3 results) No results for input(s): PROBNP in the last 8760 hours. HbA1C: No results for input(s): HGBA1C in the last 72 hours. CBG: No results for input(s): GLUCAP in the last 168 hours. Lipid Profile: No results for input(s): CHOL, HDL, LDLCALC, TRIG, CHOLHDL, LDLDIRECT in the last 72 hours. Thyroid Function Tests: No results for input(s): TSH, T4TOTAL, FREET4, T3FREE, THYROIDAB in the last 72 hours. Anemia Panel: No results for input(s): VITAMINB12, FOLATE, FERRITIN, TIBC, IRON, RETICCTPCT in the last 72 hours. Urine analysis:    Component Value Date/Time   COLORURINE YELLOW 01/15/2017 1719   APPEARANCEUR CLOUDY (A) 01/15/2017 1719   LABSPEC 1.017 01/15/2017 1719   PHURINE 5.0 01/15/2017 1719   GLUCOSEU NEGATIVE 01/15/2017 1719   HGBUR LARGE (A) 01/15/2017 1719   BILIRUBINUR NEGATIVE 01/15/2017 1719   KETONESUR NEGATIVE 01/15/2017 1719   PROTEINUR 100 (A) 01/15/2017 1719   UROBILINOGEN 0.2 07/02/2015 1757   NITRITE NEGATIVE 01/15/2017 1719   LEUKOCYTESUR SMALL (A) 01/15/2017 1719   Sepsis Labs: @LABRCNTIP (procalcitonin:4,lacticidven:4)  ) Recent Results (from the past 240 hour(s))  Culture, Urine     Status: None   Collection Time: 01/15/17  5:19 PM  Result Value Ref Range Status   Specimen Description URINE, CLEAN CATCH  Final   Special Requests NONE  Final   Culture   Final    NO GROWTH Performed at [facility], [place] [address] [place], [place] [address]    Report Status 01/17/2017 FINAL  Final         Radiology Studies: Ct Abdomen Pelvis Wo Contrast  Result Date: 01/15/2017 CLINICAL DATA:  Abdominal pain and distension.  Nausea and vomiting. EXAM: CT ABDOMEN AND PELVIS WITHOUT CONTRAST  TECHNIQUE: Multidetector CT imaging of the abdomen and pelvis was performed following the standard protocol without IV contrast. COMPARISON:  Right upper quadrant ultrasound 01/11/2017, most recent comparison CT 11/10/2016 FINDINGS: Lower chest: The lung bases are clear. Hepatobiliary: Focal fatty infiltration adjacent with falciform ligament. Gallbladder physiologically distended, no calcified stone. No biliary dilatation. Pancreas: No ductal dilatation or inflammation. Spleen: Normal in size without focal abnormality. Adrenals/Urinary Tract: 5 x 6 mm stone in the left renal pelvis, without hydronephrosis or perinephric edema. Punctate stones in the lower left kidney. Simple cyst in the upper left kidney was better assessed on prior contrast-enhanced CT. No right renal stones or obstructive uropathy. Urinary bladder is minimally distended without bladder stone. Normal adrenal glands. Stomach/Bowel: Small hiatal hernia. Stomach is decompressed.  No evidence of bowel wall thickening, distention or inflammation. Minimal submucosal fatty infiltration throughout the colon can be seen with chronic inflammation, no acute inflammatory change at this time. Previously described sigmoid colonic diverticula are not well visualized on the current exam. Normal appendix. Vascular/Lymphatic: No significant vascular findings are present. No enlarged abdominal or pelvic lymph nodes. Reproductive: There is a 3.2 cm cyst in the right adnexa, likely physiologic. Left ovaries normal in size. Uterus appears retroverted with probable fibroid. Other: No free air, free fluid, or intra-abdominal fluid collection. Tiny fat containing umbilical hernia. Musculoskeletal: There are no acute or suspicious osseous abnormalities. Unilateral left L5 pars defect. IMPRESSION: 1. Stone in the left renal pelvis measures 6 mm, no frank hydronephrosis or perinephric edema and to suggest obstructive change. Additional nonobstructing stones in the lower left  kidney. 2. A 3.2 cm right ovarian cyst is likely physiologic. Electronically Signed   By: Jeb Levering M.D.   On: 01/15/2017 19:24        Scheduled Meds: . feeding supplement  1 Container Oral TID BM  . heparin  5,000 Units Subcutaneous Q8H  . metoCLOPramide  10 mg Oral TID AC & HS  . multivitamins with iron  1 tablet Oral Daily  . pantoprazole  40 mg Oral BID  . ranitidine  150 mg Oral QHS   Continuous Infusions:    LOS: 1 day    Time spent: [place], MD [facility] ([facility]   If 7PM-7AM, please contact night-coverage [email or web address] Password [facility] 01/17/2017, 12:35 PM

## 2017-01-18 LAB — BASIC METABOLIC PANEL
Anion gap: 6 (ref 5–15)
BUN: 13 mg/dL (ref 6–20)
CO2: 28 mmol/L (ref 22–32)
Calcium: 8.6 mg/dL — ABNORMAL LOW (ref 8.9–10.3)
Chloride: 103 mmol/L (ref 101–111)
Creatinine, Ser: 0.7 mg/dL (ref 0.44–1.00)
GFR calc Af Amer: >60 mL/min (ref >60)
GFR calc non Af Amer: >60 mL/min (ref >60)
Glucose, Bld: 86 mg/dL (ref 65–99)
Potassium: 3.3 mmol/L — ABNORMAL LOW (ref 3.5–5.1)
Sodium: 137 mmol/L (ref 135–145)

## 2017-01-18 MED ORDER — PANTOPRAZOLE SODIUM 40 MG PO TBEC: 40.0000 mg | DELAYED_RELEASE_TABLET | Freq: Two times a day (BID) | ORAL | 2 refills | Status: DC

## 2017-01-18 MED ORDER — RANITIDINE HCL 150 MG/10ML PO SYRP: 150.0000 mg | ORAL_SOLUTION | Freq: Every day | ORAL | 0 refills | Status: DC

## 2017-01-18 NOTE — Discharge Summary (Addendum)
Physician Discharge Summary  Jeanne Mcneil KCL:[id number] DOB: 1986/05/13 DOA: 01/15/2017  PCP: Philis Fendt, MD  Admit date: 01/15/2017 Discharge date: 01/18/2017  Time spent: 30 minutes  Recommendations for Outpatient Follow-up:  1. Should see primary provider on follow up for pain meds 2. Recommended patient stay till tol diet, patient wished to d/c home to smoke.  Explained clearly on d/c if worsens should follow up with Dr. Oneida Alar her GI  Discharge Diagnoses:  Principal Problem:   Nausea and vomiting Active Problems:   GERD (gastroesophageal reflux disease)   Hypokalemia   Chest pain   Acute renal failure (HCC)   Hyponatremia   Leukocytosis   Discharge Condition: fair  Diet recommendation: regular  Filed Weights   01/15/17 2252  Weight: 60.5 kg (133 lb 6.4 oz)    History of present illness:  31 year old female Known history of cyclical vomiting Nephrolithiasis with admission 09/2016 Chronic abdominal pain Hyperglycemia Elevated blood pressure without a diagnosis of hypertension Bipolar on medication Multiple emergency room visits  Admitted 01/15/2017 with sinus tach cardia acute kidney injury BUN/creatinine 50/3.4 Hypokalemia 2.2 Concerns for sepsis physiology with WBC of 17 and mild transaminitis Hyponatremia 120  Hospital Course:  Nausea vomiting in setting of cyclical vomiting-note that this is probably secondary to marijuana use. Also has a history of reflux. Did not tolerate reg diet 2/28--continue Reglan 10 mg 3 times a day at bedtime and GI cocktail. She should follow with her OP Acute kidney injury-Baseline creatinine 3.4 currently 1.4  And has nomralized-saline locked iv 2.28.18 Hyponatremia sodium 128 on admission and has resolved Hypokalemia replacing potassium in IV fluid saline 100 with KCl 40 mEq in the infusion.  Monitor labs in a.m. Reflux continue Protonix 40 twice a day-added stepup therapy and an H2 blocker as well on d/c Chronic  Abdominal pain-would not give any further narcotics as this can small bowel and this was explained to the patient clearly--SHe wanted to be d/c on 3/1 and wanted to smoke.  She will be given no controlled substances on d/c  Procedures:  none   Consultations:  none  Discharge Exam: Vitals:   01/17/17 2113 01/18/17 0549  BP: (!) 166/107 (!) 132/97  Pulse: 65 71  Resp: 18 18  Temp: 98.9 F (37.2 C) 98.3 F (36.8 C)    General: eomi ncat Cardiovascular:  s1 s 2no m/r/g Respiratory: clear,  abd soft nt nd no rebound no gaurd  Discharge Instructions    Current Discharge Medication List    START taking these medications   Details  pantoprazole (PROTONIX) 40 MG tablet Take 1 tablet (40 mg total) by mouth 2 (two) times daily. Qty: 60 tablet, Refills: 2    ranitidine (ZANTAC) 150 MG/10ML syrup Take 10 mLs (150 mg total) by mouth at bedtime. Qty: 473 mL, Refills: 0      CONTINUE these medications which have NOT CHANGED   Details  metoCLOPramide (REGLAN) 10 MG tablet Take 10 mg by mouth every 6 (six) hours as needed for nausea or vomiting.    promethazine (PHENERGAN) 25 MG tablet Take 1 tablet (25 mg total) by mouth every 6 (six) hours as needed for nausea. Qty: 30 tablet, Refills: 0      STOP taking these medications     lansoprazole (PREVACID) 30 MG capsule        Allergies  Allergen Reactions  . Keflex [Cephalexin] Other (See Comments)    Reaction:  Bladder spasms  . Codeine Hives and Itching  .  Tape Other (See Comments)    Reaction:  Burning   . Vicodin [Hydrocodone-Acetaminophen] Hives and Itching      The results of significant diagnostics from this hospitalization (including imaging, microbiology, ancillary and laboratory) are listed below for reference.    Significant Diagnostic Studies: Ct Abdomen Pelvis Wo Contrast  Result Date: 01/15/2017 CLINICAL DATA:  Abdominal pain and distension.  Nausea and vomiting. EXAM: CT ABDOMEN AND PELVIS WITHOUT  CONTRAST TECHNIQUE: Multidetector CT imaging of the abdomen and pelvis was performed following the standard protocol without IV contrast. COMPARISON:  Right upper quadrant ultrasound 01/11/2017, most recent comparison CT 11/10/2016 FINDINGS: Lower chest: The lung bases are clear. Hepatobiliary: Focal fatty infiltration adjacent with falciform ligament. Gallbladder physiologically distended, no calcified stone. No biliary dilatation. Pancreas: No ductal dilatation or inflammation. Spleen: Normal in size without focal abnormality. Adrenals/Urinary Tract: 5 x 6 mm stone in the left renal pelvis, without hydronephrosis or perinephric edema. Punctate stones in the lower left kidney. Simple cyst in the upper left kidney was better assessed on prior contrast-enhanced CT. No right renal stones or obstructive uropathy. Urinary bladder is minimally distended without bladder stone. Normal adrenal glands. Stomach/Bowel: Small hiatal hernia. Stomach is decompressed. No evidence of bowel wall thickening, distention or inflammation. Minimal submucosal fatty infiltration throughout the colon can be seen with chronic inflammation, no acute inflammatory change at this time. Previously described sigmoid colonic diverticula are not well visualized on the current exam. Normal appendix. Vascular/Lymphatic: No significant vascular findings are present. No enlarged abdominal or pelvic lymph nodes. Reproductive: There is a 3.2 cm cyst in the right adnexa, likely physiologic. Left ovaries normal in size. Uterus appears retroverted with probable fibroid. Other: No free air, free fluid, or intra-abdominal fluid collection. Tiny fat containing umbilical hernia. Musculoskeletal: There are no acute or suspicious osseous abnormalities. Unilateral left L5 pars defect. IMPRESSION: 1. Stone in the left renal pelvis measures 6 mm, no frank hydronephrosis or perinephric edema and to suggest obstructive change. Additional nonobstructing stones in the  lower left kidney. 2. A 3.2 cm right ovarian cyst is likely physiologic. Electronically Signed   By: Jeb Levering M.D.   On: 01/15/2017 19:24   US Abdomen Limited Ruq  Result Date: 01/11/2017 CLINICAL DATA:  RIGHT upper quadrant pain. EXAM: US ABDOMEN LIMITED - RIGHT UPPER QUADRANT COMPARISON:  None. FINDINGS: Gallbladder: No gallstones or wall thickening visualized. No sonographic Murphy sign noted by sonographer. Common bile duct: Diameter: Normal measuring 2.5 mm. Liver: No focal lesion identified. Within normal limits in parenchymal echogenicity. IMPRESSION: Negative exam. Electronically Signed   By: Staci Righter M.D.   On: 01/11/2017 16:29    Microbiology: Recent Results (from the past 240 hour(s))  Culture, Urine     Status: None   Collection Time: 01/15/17  5:19 PM  Result Value Ref Range Status   Specimen Description URINE, CLEAN CATCH  Final   Special Requests NONE  Final   Culture   Final    NO GROWTH Performed at [facility], [address], [place], [place] [address]    Report Status 01/17/2017 FINAL  Final     Labs: Basic Metabolic Panel:  Recent Labs Lab 01/15/17 1735 01/16/17 0519 01/17/17 0533 01/18/17 0532  NA 128* 131* 135 137  K 2.7* 2.8* 3.6 3.3*  CL 82* 95* 103 103  CO2 26 26 27 28   GLUCOSE 139* 101* 112* 86  BUN 50* 33* 19 13  CREATININE 3.44* 1.44* 0.87 0.70  CALCIUM 9.8  8.6* 8.4* 8.6*  MG  --  2.5*  --   --    Liver Function Tests:  Recent Labs Lab 01/15/17 1735 01/16/17 0519  AST 46* 31  ALT 26 19  ALKPHOS 80 54  BILITOT 1.1 1.1  PROT 9.5* 6.4*  ALBUMIN 5.8* 3.8    Recent Labs Lab 01/15/17 1735  LIPASE 17   No results for input(s): AMMONIA in the last 168 hours. CBC:  Recent Labs Lab 01/15/17 1735 01/16/17 0519 01/17/17 0533  WBC 17.8* 9.8 6.6  HGB 16.1* 12.0 11.1*  HCT 44.6 34.7* 33.9*  MCV 80.8 82.4 86.5  PLT 378 250 239   Cardiac Enzymes: No results for input(s): CKTOTAL, CKMB, CKMBINDEX, TROPONINI in  the last 168 hours. BNP: BNP (last 3 results) No results for input(s): BNP in the last 8760 hours.  ProBNP (last 3 results) No results for input(s): PROBNP in the last 8760 hours.  CBG: No results for input(s): GLUCAP in the last 168 hours.     SignedNita Sells MD   [facility] 01/18/2017, 12:30 PM

## 2017-03-31 ENCOUNTER — Emergency Department (HOSPITAL_COMMUNITY)
Admission: EM | Admit: 2017-03-31 | Discharge: 2017-03-31 | Disposition: A | Discharge status: Home / Self Care | Payer: Medicaid Other | Attending: Emergency Medicine | Admitting: Emergency Medicine

## 2017-03-31 ENCOUNTER — Encounter (HOSPITAL_COMMUNITY): Payer: Self-pay | Admitting: Emergency Medicine

## 2017-03-31 DIAGNOSIS — E876 Hypokalemia: Secondary | ICD-10-CM | POA: Insufficient documentation

## 2017-03-31 DIAGNOSIS — R52 Pain, unspecified: Secondary | ICD-10-CM

## 2017-03-31 DIAGNOSIS — R1084 Generalized abdominal pain: Secondary | ICD-10-CM | POA: Insufficient documentation

## 2017-03-31 DIAGNOSIS — F17210 Nicotine dependence, cigarettes, uncomplicated: Secondary | ICD-10-CM | POA: Insufficient documentation

## 2017-03-31 LAB — URINALYSIS, ROUTINE W REFLEX MICROSCOPIC
Bacteria, UA: NONE SEEN
Bilirubin Urine: NEGATIVE
GLUCOSE, UA: NEGATIVE mg/dL
Ketones, ur: 5 mg/dL — AB
NITRITE: NEGATIVE
PH: 6 (ref 5.0–8.0)
Protein, ur: 100 mg/dL — AB
Specific Gravity, Urine: 1.025 (ref 1.005–1.030)

## 2017-03-31 LAB — I-STAT BETA HCG BLOOD, ED (MC, WL, AP ONLY): I-stat hCG, quantitative: <5 m[IU]/mL (ref <5)

## 2017-03-31 LAB — COMPREHENSIVE METABOLIC PANEL
ALBUMIN: 4.2 g/dL (ref 3.5–5.0)
ALK PHOS: 55 U/L (ref 38–126)
ALT: 16 U/L (ref 14–54)
ANION GAP: 13 (ref 5–15)
AST: 29 U/L (ref 15–41)
BILIRUBIN TOTAL: 0.5 mg/dL (ref 0.3–1.2)
BUN: 13 mg/dL (ref 6–20)
CALCIUM: 9.4 mg/dL (ref 8.9–10.3)
CO2: 24 mmol/L (ref 22–32)
Chloride: 102 mmol/L (ref 101–111)
Creatinine, Ser: 0.99 mg/dL (ref 0.44–1.00)
GFR calc Af Amer: >60 mL/min (ref >60)
GFR calc non Af Amer: >60 mL/min (ref >60)
GLUCOSE: 148 mg/dL — AB (ref 65–99)
Potassium: 3.2 mmol/L — ABNORMAL LOW (ref 3.5–5.1)
Sodium: 139 mmol/L (ref 135–145)
TOTAL PROTEIN: 7.2 g/dL (ref 6.5–8.1)

## 2017-03-31 LAB — CBC
HCT: 38.6 % (ref 36.0–46.0)
HEMOGLOBIN: 12.7 g/dL (ref 12.0–15.0)
MCH: 29.2 pg (ref 26.0–34.0)
MCHC: 32.9 g/dL (ref 30.0–36.0)
MCV: 88.7 fL (ref 78.0–100.0)
PLATELETS: 326 10*3/uL (ref 150–400)
RBC: 4.35 MIL/uL (ref 3.87–5.11)
RDW: 15 % (ref 11.5–15.5)
WBC: 12.8 10*3/uL — ABNORMAL HIGH (ref 4.0–10.5)

## 2017-03-31 LAB — LIPASE, BLOOD: Lipase: 32 U/L (ref 11–51)

## 2017-03-31 MED ORDER — POTASSIUM CHLORIDE 20 MEQ/15ML (10%) PO SOLN
40.0000 meq | Freq: Once | ORAL | Status: AC
  Administered 2017-03-31: 40 meq via ORAL
  Filled 2017-03-31: qty 30

## 2017-03-31 MED ORDER — POTASSIUM CHLORIDE 20 MEQ/15ML (10%) PO SOLN
40.0000 meq | Freq: Once | ORAL | Status: AC
Start: 2017-03-31 — End: 2017-03-31
  Administered 2017-03-31: 40 meq via ORAL
  Filled 2017-03-31: qty 30

## 2017-03-31 MED ORDER — METOCLOPRAMIDE HCL 5 MG/ML IJ SOLN
10.0000 mg | Freq: Once | INTRAMUSCULAR | Status: AC
  Administered 2017-03-31: 10 mg via INTRAVENOUS
  Filled 2017-03-31: qty 2

## 2017-03-31 MED ORDER — LORAZEPAM 2 MG/ML IJ SOLN
1.0000 mg | Freq: Once | INTRAMUSCULAR | Status: AC
  Administered 2017-03-31: 1 mg via INTRAVENOUS
  Filled 2017-03-31: qty 1

## 2017-03-31 MED ORDER — SODIUM CHLORIDE 0.9 % IV BOLUS (SEPSIS)
1000.0000 mL | Freq: Once | INTRAVENOUS | Status: AC
  Administered 2017-03-31: 1000 mL via INTRAVENOUS

## 2017-03-31 MED ORDER — KETOROLAC TROMETHAMINE 30 MG/ML IJ SOLN
30.0000 mg | Freq: Once | INTRAMUSCULAR | Status: AC
  Administered 2017-03-31: 30 mg via INTRAVENOUS
  Filled 2017-03-31: qty 1

## 2017-03-31 MED ORDER — POTASSIUM CHLORIDE CRYS ER 20 MEQ PO TBCR
40.0000 meq | EXTENDED_RELEASE_TABLET | Freq: Once | ORAL | Status: DC
  Filled 2017-03-31: qty 2

## 2017-03-31 NOTE — ED Provider Notes (Signed)
[facility] DEPT Provider Note   CSN: [id number] Arrival date & time: 03/31/17  1226     History   Chief Complaint Chief Complaint  Patient presents with  . Seizures    HPI Jeanne Mcneil is a 31 y.o. female.  HPI 31 year old female who presents with sudden onset, recurrent,  body aches, nausea and vomiting, and abdominal pain. She has a history of anxiety, depression, chronic abdominal pain, nephrolithiasis, chronic gastritis, and uterine fibroids. States that she is on her period this week, and usually this "flares up" her fibroids. As a result she has been having increased abdominal pain with nausea and vomiting and decreased by mouth intake starting yesterday. Has had body aches and shaking. States that this is consistent for when she was in renal failure and had high hypercalcemia denies any diarrhea, dysuria, urinary frequency, cough, difficulty breathing, fevers . Nothing makes her symptoms better or worse. She is not taking any medications at home.  Past Medical History:  Diagnosis Date  . Anxiety   . Back pain   . Constipation   . Depression   . GERD (gastroesophageal reflux disease)   . Glaucoma of both eyes   . Hematuria   . History of abnormal cervical Pap smear   . History of chronic gastritis   . History of kidney stones   . Hydronephrosis, right   . Renal calculi    bilateral per ct 0902-2017  . Right ureteral stone   . Urgency of urination   . Uterine fibroid   . Uterine polyp     Patient Active Problem List   Diagnosis Date Noted  . Acute renal failure ([place]) 01/16/2017  . Hyponatremia 01/16/2017  . Leukocytosis 01/16/2017  . Nausea and vomiting 01/15/2017  . Chest pain 01/15/2017  . Pyelonephritis 10/11/2016  . Hypokalemia 10/11/2016  . Hyperglycemia 10/11/2016  . Elevated blood pressure reading 10/11/2016  . Left nephrolithiasis 10/11/2016  . Renal colic on left side 85/63/1497  . Generalized anxiety disorder 10/11/2016  . Nausea with  vomiting 07/26/2016  . Ureteral stone 07/04/2016  . UTI (urinary tract infection) 07/04/2016  . Hydronephrosis of right kidney 07/04/2016  . GERD (gastroesophageal reflux disease) 07/04/2016  . Ureteral calculi 07/04/2016  . Abdominal pain, chronic, epigastric   . Hematemesis 05/16/2016  . Heme + stool 05/16/2016  . Abdominal pain, epigastric 05/16/2016    Past Surgical History:  Procedure Laterality Date  . CYSTOSCOPY W/ URETERAL STENT PLACEMENT Right 07/04/2016   Procedure: CYSTOSCOPY WITH RIGHT RETROGRADE PYELOGRAM, RIGHT URETERAL STENT PLACEMENT;  Surgeon: Ardis Hughs, MD;  Location: [facility];  Service: Urology;  Laterality: Right;  . CYSTOSCOPY WITH RETROGRADE PYELOGRAM, URETEROSCOPY AND STENT PLACEMENT Left 11/28/2007  . CYSTOSCOPY WITH RETROGRADE PYELOGRAM, URETEROSCOPY AND STENT PLACEMENT Right 08/02/2016   Procedure: CYSTOSCOPY WITH RIGHT  RETROGRADE PYELOGRAM, URETEROSCOPY,  AND STENT EXCHANGE;  Surgeon: Ardis Hughs, MD;  Location: [facility];  Service: Urology;  Laterality: Right;  . ESOPHAGOGASTRODUODENOSCOPY N/A 05/17/2016   Procedure: ESOPHAGOGASTRODUODENOSCOPY (EGD);  Surgeon: Danie Binder, MD;  Location: [facility];  Service: Endoscopy;  Laterality: N/A;  830   . INDUCED ABORTION  2009    OB History    Gravida Para Term Preterm AB Living   3 2 2   1 2    SAB TAB Ectopic Multiple Live Births   1       2       Home Medications    Prior to Admission medications  Medication Sig Start Date End Date Taking? Authorizing Provider  pantoprazole (PROTONIX) 40 MG tablet Take 1 tablet (40 mg total) by mouth 2 (two) times daily. 01/18/17  Yes Nita Sells, MD  ranitidine (ZANTAC) 150 MG tablet Take 150 mg by mouth 2 (two) times daily.   Yes [provider]  promethazine (PHENERGAN) 25 MG tablet Take 1 tablet (25 mg total) by mouth every 6 (six) hours as needed for nausea. 10/18/16   Varney Biles, MD  ranitidine (ZANTAC) 150  MG/10ML syrup Take 10 mLs (150 mg total) by mouth at bedtime. Patient not taking: Reported on 03/31/2017 01/18/17   Nita Sells, MD    Family History Family History  Problem Relation Age of Onset  . Colon cancer Paternal Uncle        Passed away 01/29/2015  . Nephrolithiasis Father   . Hypertension Father   . Nephrolithiasis Sister   . Nephrolithiasis Sister   . Nephrolithiasis Sister   . Hypertension Mother     Social History Social History  Substance Use Topics  . Smoking status: Current Every Day Smoker    Packs/day: 0.50    Years: 20.00    Types: Cigarettes  . Smokeless tobacco: Never Used     Comment: 5-7 cigarettes daily   . Alcohol use 2.4 oz/week    4 Shots of liquor per week     Comment: No ETOH in 4 months; Previously: occasionally, once every 2-3 months.     Allergies   Keflex [cephalexin]; Codeine; Tape; and Vicodin [hydrocodone-acetaminophen]   Review of Systems Review of Systems  Constitutional: Positive for fatigue. Negative for fever.  Respiratory: Negative for cough.   Gastrointestinal: Positive for abdominal pain.  Genitourinary: Positive for vaginal bleeding.  Allergic/Immunologic: Negative for immunocompromised state.  Psychiatric/Behavioral: Positive for confusion.  All other systems reviewed and are negative.    Physical Exam Updated Vital Signs BP (!) 156/82   Pulse 74   Temp 98.3 F (36.8 C) (Oral)   Resp 20   LMP 03/31/2017   SpO2 95%   Physical Exam Physical Exam  Nursing note and vitals reviewed. Constitutional: Non-toxic, and in no acute distress Head: Normocephalic and atraumatic.  Mouth/Throat: Oropharynx is clear and moist.  Neck: Normal range of motion. Neck supple.  Cardiovascular: Normal rate and regular rhythm.   Pulmonary/Chest: Effort normal and breath sounds normal.  Abdominal: Soft. There is diffuse tenderness. There is no rebound and no guarding.  Musculoskeletal: Normal range of motion.  Neurological: Alert,  no facial droop, fluent speech, moves all extremities symmetrically Skin: Skin is warm and dry.  Psychiatric: Cooperative   ED Treatments / Results  Labs (all labs ordered are listed, but only abnormal results are displayed) Labs Reviewed  COMPREHENSIVE METABOLIC PANEL - Abnormal; Notable for the following:       Result Value   Potassium 3.2 (*)    Glucose, Bld 148 (*)    All other components within normal limits  CBC - Abnormal; Notable for the following:    WBC 12.8 (*)    All other components within normal limits  URINALYSIS, ROUTINE W REFLEX MICROSCOPIC - Abnormal; Notable for the following:    Color, Urine AMBER (*)    APPearance CLOUDY (*)    Hgb urine dipstick LARGE (*)    Ketones, ur 5 (*)    Protein, ur 100 (*)    Leukocytes, UA SMALL (*)    Squamous Epithelial / LPF TOO NUMEROUS TO COUNT (*)  All other components within normal limits  LIPASE, BLOOD  I-STAT BETA HCG BLOOD, ED ([facility], [facility], [facility] ONLY)    EKG  EKG Interpretation  Date/Time:  Saturday Mar 31 2017 12:32:54 EDT Ventricular Rate:  73 PR Interval:    QRS Duration: 102 QT Interval:  407 QTC Calculation: 449 R Axis:   89 Text Interpretation:  Sinus rhythm Atrial premature complex Nonspecific T abnrm, anterolateral leads no acute changes compared with 07/25/16 Confirmed by Meranda Dechaine MD, Estelene Carmack [phone]) on 03/31/2017 1:56:09 PM       Radiology No results found.  Procedures Procedures (including critical care time)  Medications Ordered in ED Medications  potassium chloride 20 MEQ/15ML (10%) solution 40 mEq (40 mEq Oral Given 03/31/17 1328)  sodium chloride 0.9 % bolus 1,000 mL (0 mLs Intravenous Stopped 03/31/17 1516)  LORazepam (ATIVAN) injection 1 mg (1 mg Intravenous Given 03/31/17 1406)  ketorolac (TORADOL) 30 MG/ML injection 30 mg (30 mg Intravenous Given 03/31/17 1409)  metoCLOPramide (REGLAN) injection 10 mg (10 mg Intravenous Given 03/31/17 1403)  potassium chloride 20 MEQ/15ML (10%) solution 40 mEq (40 mEq  Oral Given 03/31/17 1451)     Initial Impression / Assessment and Plan / ED Course  I have reviewed the triage vital signs and the nursing notes.  Pertinent labs & imaging results that were available during my care of the patient were reviewed by me and considered in my medical decision making (see chart for details).     Presenting with body aches, muscle cramping, malaise, that is consistent with prior history of hypokalemia and hypercalcemia. States that this has worsened in the setting of her menstrual cramps with nausea and vomiting recently.  She is nontoxic. She is tearful, rocking back and forth in bed, appears very anxious. Her blood work shows mild hypokalemia of 3.2. She is a normal creatinine and normal calcium. Diffuse tenderness, worse in suprapubic and epigastric where fibroids and chronic gastritis is. Soft nonsurgical abdomen, related to chronic abdominal pain.  Given IVF, toradol, reglan, and potassium repletion. She was noticed by nurses to stick fingers in throat and subsequently vomiting potassium repletion. On my follow-up, patient is requesting discharge home as she has home phenergan and home oral potassium to take. Strict return and follow-up instructions reviewed. Sheexpressed understanding of all discharge instructions and felt comfortable with the plan of care.   Final Clinical Impressions(s) / ED Diagnoses   Final diagnoses:  Generalized abdominal pain  Body aches  Hypokalemia    New Prescriptions New Prescriptions   No medications on file     Forde Dandy, MD 03/31/17 1520

## 2017-03-31 NOTE — ED Triage Notes (Signed)
Per ems, pt called out for pain all over, and abdominal pain. Hx of hypercalcemia and kidney failure. Pt Is not on dialysis. Per ems pt have "seizure like activity" but remained alert and oriented the whole time. Given 2.5 midazolam. VSS. 12 lead unremarkable. Pt is AAOx4 in NAD.

## 2017-03-31 NOTE — Discharge Instructions (Signed)
Please continue to take your potassium repletion at home.  Continue to take home nausea medications.  Return for worsening symptoms.

## 2017-03-31 NOTE — ED Notes (Signed)
Pt sticking fingers in throat, states "it feels like something's in there that needs to come up." This RN asked pt to stop sticking fingers in throat; pt refuses.

## 2017-03-31 NOTE — ED Notes (Signed)
Pt vomited potassium up, will retry after reglan.

## 2017-03-31 NOTE — ED Notes (Signed)
Pt states vomited after admin of potassium. Pt states she has "potassium I can take at home."

## 2017-04-02 ENCOUNTER — Encounter (HOSPITAL_COMMUNITY): Payer: Self-pay

## 2017-04-02 ENCOUNTER — Inpatient Hospital Stay (HOSPITAL_COMMUNITY)
Admission: EM | Admit: 2017-04-02 | Discharge: 2017-04-04 | DRG: 690 | Discharge status: Against Medical Advice | Payer: Self-pay | Attending: Internal Medicine | Admitting: Internal Medicine

## 2017-04-02 ENCOUNTER — Observation Stay (HOSPITAL_COMMUNITY): Payer: Self-pay

## 2017-04-02 DIAGNOSIS — N200 Calculus of kidney: Secondary | ICD-10-CM

## 2017-04-02 DIAGNOSIS — N390 Urinary tract infection, site not specified: Principal | ICD-10-CM | POA: Diagnosis present

## 2017-04-02 DIAGNOSIS — E860 Dehydration: Secondary | ICD-10-CM | POA: Diagnosis present

## 2017-04-02 DIAGNOSIS — F17210 Nicotine dependence, cigarettes, uncomplicated: Secondary | ICD-10-CM | POA: Diagnosis present

## 2017-04-02 DIAGNOSIS — R110 Nausea: Secondary | ICD-10-CM

## 2017-04-02 DIAGNOSIS — E876 Hypokalemia: Secondary | ICD-10-CM

## 2017-04-02 DIAGNOSIS — R739 Hyperglycemia, unspecified: Secondary | ICD-10-CM | POA: Diagnosis present

## 2017-04-02 DIAGNOSIS — R112 Nausea with vomiting, unspecified: Secondary | ICD-10-CM

## 2017-04-02 DIAGNOSIS — R030 Elevated blood-pressure reading, without diagnosis of hypertension: Secondary | ICD-10-CM | POA: Diagnosis present

## 2017-04-02 DIAGNOSIS — N2889 Other specified disorders of kidney and ureter: Secondary | ICD-10-CM

## 2017-04-02 DIAGNOSIS — R079 Chest pain, unspecified: Secondary | ICD-10-CM | POA: Diagnosis present

## 2017-04-02 DIAGNOSIS — Z8249 Family history of ischemic heart disease and other diseases of the circulatory system: Secondary | ICD-10-CM

## 2017-04-02 DIAGNOSIS — R1110 Vomiting, unspecified: Secondary | ICD-10-CM

## 2017-04-02 DIAGNOSIS — K2950 Unspecified chronic gastritis without bleeding: Secondary | ICD-10-CM

## 2017-04-02 DIAGNOSIS — G8929 Other chronic pain: Secondary | ICD-10-CM | POA: Diagnosis present

## 2017-04-02 DIAGNOSIS — N202 Calculus of kidney with calculus of ureter: Secondary | ICD-10-CM | POA: Diagnosis present

## 2017-04-02 DIAGNOSIS — F1290 Cannabis use, unspecified, uncomplicated: Secondary | ICD-10-CM | POA: Diagnosis present

## 2017-04-02 DIAGNOSIS — R1084 Generalized abdominal pain: Secondary | ICD-10-CM

## 2017-04-02 DIAGNOSIS — N179 Acute kidney failure, unspecified: Secondary | ICD-10-CM | POA: Diagnosis present

## 2017-04-02 DIAGNOSIS — N281 Cyst of kidney, acquired: Secondary | ICD-10-CM | POA: Diagnosis present

## 2017-04-02 LAB — COMPREHENSIVE METABOLIC PANEL
ALK PHOS: 66 U/L (ref 38–126)
ALT: 21 U/L (ref 14–54)
ALT: 19 U/L (ref 14–54)
AST: 33 U/L (ref 15–41)
AST: 23 U/L (ref 15–41)
Albumin: 5.1 g/dL — ABNORMAL HIGH (ref 3.5–5.0)
Albumin: 3.9 g/dL (ref 3.5–5.0)
Alkaline Phosphatase: 48 U/L (ref 38–126)
Anion gap: 19 — ABNORMAL HIGH (ref 5–15)
Anion gap: 14 (ref 5–15)
BUN: 21 mg/dL — AB (ref 6–20)
BUN: 21 mg/dL — AB (ref 6–20)
CALCIUM: 10.1 mg/dL (ref 8.9–10.3)
CHLORIDE: 97 mmol/L — AB (ref 101–111)
CHLORIDE: 101 mmol/L (ref 101–111)
CO2: 24 mmol/L (ref 22–32)
CO2: 26 mmol/L (ref 22–32)
CREATININE: 1.37 mg/dL — AB (ref 0.44–1.00)
CREATININE: 0.95 mg/dL (ref 0.44–1.00)
Calcium: 8.3 mg/dL — ABNORMAL LOW (ref 8.9–10.3)
GFR calc non Af Amer: 50 mL/min — ABNORMAL LOW (ref >60)
GFR calc non Af Amer: >60 mL/min (ref >60)
GFR, EST AFRICAN AMERICAN: 58 mL/min — AB (ref >60)
Glucose, Bld: 224 mg/dL — ABNORMAL HIGH (ref 65–99)
Glucose, Bld: 158 mg/dL — ABNORMAL HIGH (ref 65–99)
Potassium: 2.8 mmol/L — ABNORMAL LOW (ref 3.5–5.1)
Potassium: 2.9 mmol/L — ABNORMAL LOW (ref 3.5–5.1)
SODIUM: 140 mmol/L (ref 135–145)
SODIUM: 141 mmol/L (ref 135–145)
TOTAL PROTEIN: 8.2 g/dL — AB (ref 6.5–8.1)
Total Bilirubin: 0.8 mg/dL (ref 0.3–1.2)
Total Bilirubin: 0.7 mg/dL (ref 0.3–1.2)
Total Protein: 6.6 g/dL (ref 6.5–8.1)

## 2017-04-02 LAB — BASIC METABOLIC PANEL
ANION GAP: 10 (ref 5–15)
Anion gap: 6 (ref 5–15)
BUN: 20 mg/dL (ref 6–20)
BUN: 15 mg/dL (ref 6–20)
CHLORIDE: 104 mmol/L (ref 101–111)
CO2: 26 mmol/L (ref 22–32)
CO2: 26 mmol/L (ref 22–32)
Calcium: 8.2 mg/dL — ABNORMAL LOW (ref 8.9–10.3)
Calcium: 8.1 mg/dL — ABNORMAL LOW (ref 8.9–10.3)
Chloride: 102 mmol/L (ref 101–111)
Creatinine, Ser: 0.93 mg/dL (ref 0.44–1.00)
Creatinine, Ser: 0.66 mg/dL (ref 0.44–1.00)
GFR calc non Af Amer: >60 mL/min (ref >60)
GFR calc non Af Amer: >60 mL/min (ref >60)
GLUCOSE: 117 mg/dL — AB (ref 65–99)
GLUCOSE: 100 mg/dL — AB (ref 65–99)
POTASSIUM: 2.8 mmol/L — AB (ref 3.5–5.1)
POTASSIUM: 3.4 mmol/L — AB (ref 3.5–5.1)
SODIUM: 136 mmol/L (ref 135–145)
Sodium: 138 mmol/L (ref 135–145)

## 2017-04-02 LAB — CBC WITH DIFFERENTIAL/PLATELET
BASOS ABS: 0.1 10*3/uL (ref 0.0–0.1)
Basophils Absolute: 0 10*3/uL (ref 0.0–0.1)
Basophils Relative: 0 %
Basophils Relative: 0 %
EOS PCT: 0 %
Eosinophils Absolute: 0 10*3/uL (ref 0.0–0.7)
Eosinophils Absolute: 0 10*3/uL (ref 0.0–0.7)
Eosinophils Relative: 0 %
HCT: 37.7 % (ref 36.0–46.0)
HEMATOCRIT: 33.4 % — AB (ref 36.0–46.0)
HEMOGLOBIN: 12.8 g/dL (ref 12.0–15.0)
Hemoglobin: 10.8 g/dL — ABNORMAL LOW (ref 12.0–15.0)
LYMPHS ABS: 1.9 10*3/uL (ref 0.7–4.0)
LYMPHS ABS: 1.1 10*3/uL (ref 0.7–4.0)
LYMPHS PCT: 10 %
LYMPHS PCT: 8 %
MCH: 29.6 pg (ref 26.0–34.0)
MCH: 28.5 pg (ref 26.0–34.0)
MCHC: 34 g/dL (ref 30.0–36.0)
MCHC: 32.3 g/dL (ref 30.0–36.0)
MCV: 87.3 fL (ref 78.0–100.0)
MCV: 88.1 fL (ref 78.0–100.0)
Monocytes Absolute: 1.2 10*3/uL — ABNORMAL HIGH (ref 0.1–1.0)
Monocytes Absolute: 0.7 10*3/uL (ref 0.1–1.0)
Monocytes Relative: 6 %
Monocytes Relative: 5 %
NEUTROS ABS: 10.8 10*3/uL — AB (ref 1.7–7.7)
NEUTROS PCT: 84 %
Neutro Abs: 16.1 10*3/uL — ABNORMAL HIGH (ref 1.7–7.7)
Neutrophils Relative %: 87 %
PLATELETS: 270 10*3/uL (ref 150–400)
Platelets: 397 10*3/uL (ref 150–400)
RBC: 4.32 MIL/uL (ref 3.87–5.11)
RBC: 3.79 MIL/uL — AB (ref 3.87–5.11)
RDW: 15.1 % (ref 11.5–15.5)
RDW: 15.2 % (ref 11.5–15.5)
WBC: 19.3 10*3/uL — ABNORMAL HIGH (ref 4.0–10.5)
WBC: 12.5 10*3/uL — AB (ref 4.0–10.5)

## 2017-04-02 LAB — LIPASE, BLOOD: Lipase: 16 U/L (ref 11–51)

## 2017-04-02 LAB — URINALYSIS, ROUTINE W REFLEX MICROSCOPIC
BACTERIA UA: NONE SEEN
Glucose, UA: 50 mg/dL — AB
Ketones, ur: 20 mg/dL — AB
Nitrite: NEGATIVE
PROTEIN: 100 mg/dL — AB
Specific Gravity, Urine: 1.029 (ref 1.005–1.030)
pH: 5 (ref 5.0–8.0)

## 2017-04-02 LAB — RAPID URINE DRUG SCREEN, HOSP PERFORMED
Amphetamines: NOT DETECTED
BARBITURATES: NOT DETECTED
Benzodiazepines: POSITIVE — AB
Cocaine: NOT DETECTED
Opiates: NOT DETECTED
Tetrahydrocannabinol: POSITIVE — AB

## 2017-04-02 LAB — MAGNESIUM: Magnesium: 1.9 mg/dL (ref 1.7–2.4)

## 2017-04-02 LAB — PREGNANCY, URINE: Preg Test, Ur: NEGATIVE

## 2017-04-02 LAB — GLUCOSE, CAPILLARY
Glucose-Capillary: 90 mg/dL (ref 65–99)
Glucose-Capillary: 123 mg/dL — ABNORMAL HIGH (ref 65–99)
Glucose-Capillary: 95 mg/dL (ref 65–99)

## 2017-04-02 MED ORDER — METOCLOPRAMIDE HCL 5 MG/ML IJ SOLN
10.0000 mg | Freq: Once | INTRAMUSCULAR | Status: AC
  Administered 2017-04-02: 10 mg via INTRAVENOUS
  Filled 2017-04-02: qty 2

## 2017-04-02 MED ORDER — KETOROLAC TROMETHAMINE 15 MG/ML IJ SOLN
15.0000 mg | Freq: Four times a day (QID) | INTRAMUSCULAR | Status: DC | PRN
  Administered 2017-04-02 – 2017-04-03 (×2): 15 mg via INTRAVENOUS
  Filled 2017-04-02 (×3): qty 1

## 2017-04-02 MED ORDER — KETOROLAC TROMETHAMINE 15 MG/ML IJ SOLN
15.0000 mg | Freq: Once | INTRAMUSCULAR | Status: AC
  Administered 2017-04-02: 15 mg via INTRAVENOUS
  Filled 2017-04-02: qty 1

## 2017-04-02 MED ORDER — KETOROLAC TROMETHAMINE 30 MG/ML IJ SOLN
30.0000 mg | Freq: Once | INTRAMUSCULAR | Status: AC
  Administered 2017-04-02: 30 mg via INTRAVENOUS
  Filled 2017-04-02: qty 1

## 2017-04-02 MED ORDER — POTASSIUM CHLORIDE 10 MEQ/100ML IV SOLN
10.0000 meq | INTRAVENOUS | Status: AC
  Administered 2017-04-02 (×4): 10 meq via INTRAVENOUS
  Filled 2017-04-02 (×4): qty 100

## 2017-04-02 MED ORDER — DEXTROSE 5 % IV SOLN
1.0000 g | Freq: Every day | INTRAVENOUS | Status: DC
  Administered 2017-04-02 – 2017-04-04 (×3): 1 g via INTRAVENOUS
  Filled 2017-04-02 (×3): qty 10

## 2017-04-02 MED ORDER — POTASSIUM CHLORIDE 10 MEQ/100ML IV SOLN
10.0000 meq | INTRAVENOUS | Status: AC
  Administered 2017-04-02 (×5): 10 meq via INTRAVENOUS
  Filled 2017-04-02 (×5): qty 100

## 2017-04-02 MED ORDER — SODIUM CHLORIDE 0.9 % IV BOLUS (SEPSIS)
1000.0000 mL | Freq: Once | INTRAVENOUS | Status: AC
  Administered 2017-04-02: 1000 mL via INTRAVENOUS

## 2017-04-02 MED ORDER — METOCLOPRAMIDE HCL 5 MG/ML IJ SOLN
5.0000 mg | Freq: Three times a day (TID) | INTRAMUSCULAR | Status: DC
  Administered 2017-04-02 – 2017-04-04 (×7): 5 mg via INTRAVENOUS
  Filled 2017-04-02 (×7): qty 2

## 2017-04-02 MED ORDER — ACETAMINOPHEN 325 MG PO TABS: 650.0000 mg | ORAL_TABLET | Freq: Four times a day (QID) | ORAL | Status: DC | PRN

## 2017-04-02 MED ORDER — PROMETHAZINE HCL 25 MG/ML IJ SOLN
25.0000 mg | Freq: Once | INTRAMUSCULAR | Status: AC
  Administered 2017-04-02: 25 mg via INTRAVENOUS
  Filled 2017-04-02: qty 1

## 2017-04-02 MED ORDER — GADOBENATE DIMEGLUMINE 529 MG/ML IV SOLN
15.0000 mL | Freq: Once | INTRAVENOUS | Status: AC | PRN
  Administered 2017-04-02: 13 mL via INTRAVENOUS

## 2017-04-02 MED ORDER — PROMETHAZINE HCL 25 MG/ML IJ SOLN
12.5000 mg | Freq: Four times a day (QID) | INTRAMUSCULAR | Status: DC | PRN
  Administered 2017-04-02 – 2017-04-03 (×2): 12.5 mg via INTRAVENOUS
  Filled 2017-04-02 (×3): qty 1

## 2017-04-02 MED ORDER — SODIUM CHLORIDE 0.9 % IV SOLN: INTRAVENOUS | Status: DC

## 2017-04-02 MED ORDER — ACETAMINOPHEN 650 MG RE SUPP: 650.0000 mg | Freq: Four times a day (QID) | RECTAL | Status: DC | PRN

## 2017-04-02 MED ORDER — PANTOPRAZOLE SODIUM 40 MG IV SOLR
40.0000 mg | Freq: Every day | INTRAVENOUS | Status: DC
  Administered 2017-04-02 – 2017-04-03 (×2): 40 mg via INTRAVENOUS
  Filled 2017-04-02 (×2): qty 40

## 2017-04-02 MED ORDER — MORPHINE SULFATE (PF) 4 MG/ML IV SOLN
1.0000 mg | INTRAVENOUS | Status: DC | PRN
  Administered 2017-04-02 – 2017-04-03 (×4): 1 mg via INTRAVENOUS
  Filled 2017-04-02 (×4): qty 1

## 2017-04-02 MED ORDER — DIPHENHYDRAMINE HCL 50 MG/ML IJ SOLN
25.0000 mg | Freq: Once | INTRAMUSCULAR | Status: AC
  Administered 2017-04-02: 25 mg via INTRAVENOUS
  Filled 2017-04-02: qty 1

## 2017-04-02 MED ORDER — MAGNESIUM SULFATE 2 GM/50ML IV SOLN
2.0000 g | Freq: Once | INTRAVENOUS | Status: AC
  Administered 2017-04-02: 2 g via INTRAVENOUS
  Filled 2017-04-02: qty 50

## 2017-04-02 MED ORDER — POTASSIUM CHLORIDE IN NACL 20-0.9 MEQ/L-% IV SOLN
INTRAVENOUS | Status: DC
  Administered 2017-04-02 – 2017-04-04 (×7): via INTRAVENOUS
  Filled 2017-04-02 (×9): qty 1000

## 2017-04-02 NOTE — ED Triage Notes (Signed)
States seen at [facility] yesterday for same complaint of abdominal pain and release back today for pain control states severe nausea voiced moaning in triage.

## 2017-04-02 NOTE — H&P (Addendum)
History and Physical    Jeanne Mcneil JIR:[id number] DOB: Oct 21, 1986 DOA: 04/02/2017  PCP: Nolene Ebbs, MD  Patient coming from: Home.  Chief Complaint: Nausea vomiting abdominal pain.  HPI: Jeanne Mcneil is a 31 y.o. female with history of marijuana abuse who was admitted on March 2018 for nausea vomiting with renal failure in the setting of marijuana abuse presents to the ER with complaints of nausea vomiting of the last couple of days. Patient also has been having crampy abdominal discomfort. Denies any diarrhea. Denies any sick contacts or recent travel. Admits to have taken marijuana yesterday. Denies any fever or chills.   ED Course: Patient come to the ER 24 hours ago and was given IV fluids and antiemetics and discharged home. Despite this patient was having vomiting and presents to the ER again. Labs reveal hypokalemia with leukocytosis and UA showing possible UTI. Patient was given 2 L fluid bolus, Reglan and Benadryl in the ER and admitted for further observation. On my exam patient's abdomen appears benign. Patient had some cramps in her lower extremities.  Review of Systems: As per HPI, rest all negative.   Past Medical History:  Diagnosis Date  . Anxiety   . Back pain   . Constipation   . Depression   . GERD (gastroesophageal reflux disease)   . Glaucoma of both eyes   . Hematuria   . History of abnormal cervical Pap smear   . History of chronic gastritis   . History of kidney stones   . Hydronephrosis, right   . Renal calculi    bilateral per ct 0902-2017  . Right ureteral stone   . Urgency of urination   . Uterine fibroid   . Uterine polyp     Past Surgical History:  Procedure Laterality Date  . CYSTOSCOPY W/ URETERAL STENT PLACEMENT Right 07/04/2016   Procedure: CYSTOSCOPY WITH RIGHT RETROGRADE PYELOGRAM, RIGHT URETERAL STENT PLACEMENT;  Surgeon: Ardis Hughs, MD;  Location: [facility];  Service: Urology;  Laterality: Right;  . CYSTOSCOPY WITH  RETROGRADE PYELOGRAM, URETEROSCOPY AND STENT PLACEMENT Left 11/28/2007  . CYSTOSCOPY WITH RETROGRADE PYELOGRAM, URETEROSCOPY AND STENT PLACEMENT Right 08/02/2016   Procedure: CYSTOSCOPY WITH RIGHT  RETROGRADE PYELOGRAM, URETEROSCOPY,  AND STENT EXCHANGE;  Surgeon: Ardis Hughs, MD;  Location: [facility];  Service: Urology;  Laterality: Right;  . ESOPHAGOGASTRODUODENOSCOPY N/A 05/17/2016   Procedure: ESOPHAGOGASTRODUODENOSCOPY (EGD);  Surgeon: Danie Binder, MD;  Location: [facility];  Service: Endoscopy;  Laterality: N/A;  830   . INDUCED ABORTION  02-02-2008     reports that she has been smoking Cigarettes.  She has a 10.00 pack-year smoking history. She has never used smokeless tobacco. She reports that she drinks about 2.4 oz of alcohol per week . She reports that she does not use drugs.  Allergies  Allergen Reactions  . Keflex [Cephalexin] Other (See Comments)    Reaction:  Bladder spasms  . Codeine Hives and Itching  . Tape Other (See Comments)    Reaction:  Burning   . Vicodin [Hydrocodone-Acetaminophen] Hives and Itching    Family History  Problem Relation Age of Onset  . Colon cancer Paternal Uncle        Passed away 2015/02/01  . Nephrolithiasis Father   . Hypertension Father   . Nephrolithiasis Sister   . Nephrolithiasis Sister   . Nephrolithiasis Sister   . Hypertension Mother     Prior to Admission medications   Medication Sig Start  Date End Date Taking? Authorizing Provider  pantoprazole (PROTONIX) 40 MG tablet Take 1 tablet (40 mg total) by mouth 2 (two) times daily. 01/18/17  Yes Nita Sells, MD  promethazine (PHENERGAN) 25 MG tablet Take 1 tablet (25 mg total) by mouth every 6 (six) hours as needed for nausea. 10/18/16  Yes Varney Biles, MD  ranitidine (ZANTAC) 150 MG tablet Take 150 mg by mouth 2 (two) times daily.   Yes [provider]  ranitidine (ZANTAC) 150 MG/10ML syrup Take 10 mLs (150 mg total) by mouth at bedtime. Patient  not taking: Reported on 03/31/2017 01/18/17   Nita Sells, MD    Physical Exam: Vitals:   04/02/17 0019 04/02/17 0020 04/02/17 0312 04/02/17 0325  BP: (!) 140/100  (!) 158/89 (!) 133/104  Pulse: 90  73 74  Resp: (!) 28  17 20   Temp: 97.4 F (36.3 C)   98.2 F (36.8 C)  TempSrc: Oral   Oral  SpO2: 98%  98% 100%  Weight:  60.3 kg (133 lb)  59.1 kg (130 lb 4.7 oz)  Height:  5' 4"  (1.626 m)  5' 2"  (1.575 m)      Constitutional: Moderately built and nourished. Vitals:   04/02/17 0019 04/02/17 0020 04/02/17 0312 04/02/17 0325  BP: (!) 140/100  (!) 158/89 (!) 133/104  Pulse: 90  73 74  Resp: (!) 28  17 20   Temp: 97.4 F (36.3 C)   98.2 F (36.8 C)  TempSrc: Oral   Oral  SpO2: 98%  98% 100%  Weight:  60.3 kg (133 lb)  59.1 kg (130 lb 4.7 oz)  Height:  5' 4"  (1.626 m)  5' 2"  (1.575 m)   Eyes: Anicteric. no pallor. ENMT: No discharge from the ears eyes nose and mouth. Neck: No mass felt. No neck rigidity. Respiratory: No rhonchi or crepitations. Cardiovascular: S1-S2 heard no murmurs appreciated. Abdomen: Soft nontender bowel sounds present. Musculoskeletal: No edema. No joint effusion. Skin: No rash. Skin appears warm. Neurologic: Alert awake oriented to time place and person. Moves all extremities. Psychiatric: Appears normal. Normal affect.   Labs on Admission: I have personally reviewed following labs and imaging studies  CBC:  Recent Labs Lab 03/31/17 1200 04/02/17 0113  WBC 12.8* 19.3*  NEUTROABS  --  16.1*  HGB 12.7 12.8  HCT 38.6 37.7  MCV 88.7 87.3  PLT 326 941   Basic Metabolic Panel:  Recent Labs Lab 03/31/17 1200 04/02/17 0113  NA 139 140  K 3.2* 2.8*  CL 102 97*  CO2 24 24  GLUCOSE 148* 224*  BUN 13 21*  CREATININE 0.99 1.37*  CALCIUM 9.4 10.1   GFR: Estimated Creatinine Clearance: 46.2 mL/min (A) (by C-G formula based on SCr of 1.37 mg/dL (H)). Liver Function Tests:  Recent Labs Lab 03/31/17 1200 04/02/17 0113  AST 29 33    ALT 16 21  ALKPHOS 55 66  BILITOT 0.5 0.8  PROT 7.2 8.2*  ALBUMIN 4.2 5.1*    Recent Labs Lab 03/31/17 1200 04/02/17 0113  LIPASE 32 16   No results for input(s): AMMONIA in the last 168 hours. Coagulation Profile: No results for input(s): INR, PROTIME in the last 168 hours. Cardiac Enzymes: No results for input(s): CKTOTAL, CKMB, CKMBINDEX, TROPONINI in the last 168 hours. BNP (last 3 results) No results for input(s): PROBNP in the last 8760 hours. HbA1C: No results for input(s): HGBA1C in the last 72 hours. CBG: No results for input(s): GLUCAP in the last 168 hours. Lipid  Profile: No results for input(s): CHOL, HDL, LDLCALC, TRIG, CHOLHDL, LDLDIRECT in the last 72 hours. Thyroid Function Tests: No results for input(s): TSH, T4TOTAL, FREET4, T3FREE, THYROIDAB in the last 72 hours. Anemia Panel: No results for input(s): VITAMINB12, FOLATE, FERRITIN, TIBC, IRON, RETICCTPCT in the last 72 hours. Urine analysis:    Component Value Date/Time   COLORURINE AMBER (A) 04/02/2017 0027   APPEARANCEUR CLOUDY (A) 04/02/2017 0027   LABSPEC 1.029 04/02/2017 0027   PHURINE 5.0 04/02/2017 0027   GLUCOSEU 50 (A) 04/02/2017 0027   HGBUR LARGE (A) 04/02/2017 0027   BILIRUBINUR MODERATE (A) 04/02/2017 0027   KETONESUR 20 (A) 04/02/2017 0027   PROTEINUR 100 (A) 04/02/2017 0027   UROBILINOGEN 0.2 07/02/2015 1757   NITRITE NEGATIVE 04/02/2017 0027   LEUKOCYTESUR TRACE (A) 04/02/2017 0027   Sepsis Labs: @LABRCNTIP (procalcitonin:4,lacticidven:4) )No results found for this or any previous visit (from the past 240 hour(s)).   Radiological Exams on Admission: No results found.  EKG: Independently reviewed. Normal sinus rhythm.  Assessment/Plan Active Problems:   UTI (urinary tract infection)   Hypokalemia   Hyperglycemia   Nausea & vomiting    1. Intractable nausea vomiting - cause not clear. Abdomen appears benign. LFTs are normal. Patient does admit to have taken marijuana  which could possibly cause. Urine pregnancy screen is pending if negative will check acute abdominal series. Avoid narcotics. Patient is placed on when necessary Phenergan for nausea vomiting. 2. Hyperglycemia - check hemoglobin A1c. 3. Possible UTI - check urine culture patient is placed on ceftriaxone. 4. Acute renal failure probably from nausea vomiting - patient did receive 2 L normal saline bolus in the ER. Recheck metabolic panel after hydration. Likely prerenal. 5. Hypokalemia - probably from vomiting. Replace and recheck. Check magnesium levels. 6. Leukocytosis - probably reactionary. Patient however has UA showing features consistent with UTI. Patient is on ceftriaxone. Follow CBC. 7. Elevated blood pressure - closely follow blood pressure trends.   DVT prophylaxis: SCDs. Code Status: Full code.  Family Communication: Discussed with patient.  Disposition Plan: Home.  Consults called: None.  Admission status: Observation.    Rise Patience MD [facility] Pager [phone].  If 7PM-7AM, please contact night-coverage [email or web address] Password [facility]  04/02/2017, 4:25 AM

## 2017-04-02 NOTE — Progress Notes (Signed)
PHARMACY NOTE -  ANTIBIOTIC RENAL DOSE ADJUSTMENT   Request received for Pharmacy to assist with antibiotic renal dose adjustment.  Patient has been initiated on Ceftriaxone 1gm iv q24hr   for UTI. SCr 1.37, estimated CrCl 46 ml/min Current dosage is appropriate and need for further dosage adjustment appears unlikely at present. Will sign off at this time.  Please reconsult if a change in clinical status warrants re-evaluation of dosage.

## 2017-04-02 NOTE — ED Provider Notes (Signed)
[place] DEPT Provider Note   CSN: [id number] Arrival date & time: 04/02/17  0018  By signing my name below, I, Ethelle Lyon Long, attest that this documentation has been prepared under the direction and in the presence of [place]. Zaylie Gisler, PA-C. Electronically Signed: Ethelle Lyon Long, Scribe. 04/02/2017. 1:08 AM.  History   Chief Complaint Chief Complaint  Patient presents with  . Abdominal Pain   The history is provided by the patient and medical records. No language interpreter was used.    HPI Comments:  Jeanne Mcneil is a 31 y.o. female with a PMHx of GERD, Anxiety, Uterine Polyp, Chronic Gastritis, Prior Renal Calculi, and Depression, who presents to the Emergency Department complaining of constant, severe abdominal pain onset three days ago. Per chart review, pt was seen [facility] at [facility] ED for same with no improvement of her constant, severe abdominal pain. She states she has decreased urine production as of late with her associated persistent emesis, generalized total body muscle cramps, severe nausea, and CP that she states is similar to past experiences of CP arising when her Potassium is low. She has not been able to keep anything down but is tolerating her secretions well. No alleviating or exacerbating factors noted. Pt denies fever, chills, and any other complaints at this time.   Past Medical History:  Diagnosis Date  . Anxiety   . Back pain   . Constipation   . Depression   . GERD (gastroesophageal reflux disease)   . Glaucoma of both eyes   . Hematuria   . History of abnormal cervical Pap smear   . History of chronic gastritis   . History of kidney stones   . Hydronephrosis, right   . Renal calculi    bilateral per ct 0902-2017  . Right ureteral stone   . Urgency of urination   . Uterine fibroid   . Uterine polyp    Patient Active Problem List   Diagnosis Date Noted  . Nausea & vomiting 04/02/2017  . Acute renal failure ([place]) 01/16/2017   . Hyponatremia 01/16/2017  . Leukocytosis 01/16/2017  . Nausea and vomiting 01/15/2017  . Chest pain 01/15/2017  . Pyelonephritis 10/11/2016  . Hypokalemia 10/11/2016  . Hyperglycemia 10/11/2016  . Elevated blood pressure reading 10/11/2016  . Left nephrolithiasis 10/11/2016  . Renal colic on left side 11/28/3233  . Generalized anxiety disorder 10/11/2016  . Nausea with vomiting 07/26/2016  . Ureteral stone 07/04/2016  . UTI (urinary tract infection) 07/04/2016  . Hydronephrosis of right kidney 07/04/2016  . GERD (gastroesophageal reflux disease) 07/04/2016  . Ureteral calculi 07/04/2016  . Abdominal pain, chronic, epigastric   . Hematemesis 05/16/2016  . Heme + stool 05/16/2016  . Abdominal pain, epigastric 05/16/2016    Past Surgical History:  Procedure Laterality Date  . CYSTOSCOPY W/ URETERAL STENT PLACEMENT Right 07/04/2016   Procedure: CYSTOSCOPY WITH RIGHT RETROGRADE PYELOGRAM, RIGHT URETERAL STENT PLACEMENT;  Surgeon: Ardis Hughs, MD;  Location: [facility];  Service: Urology;  Laterality: Right;  . CYSTOSCOPY WITH RETROGRADE PYELOGRAM, URETEROSCOPY AND STENT PLACEMENT Left 11/28/2007  . CYSTOSCOPY WITH RETROGRADE PYELOGRAM, URETEROSCOPY AND STENT PLACEMENT Right 08/02/2016   Procedure: CYSTOSCOPY WITH RIGHT  RETROGRADE PYELOGRAM, URETEROSCOPY,  AND STENT EXCHANGE;  Surgeon: Ardis Hughs, MD;  Location: [facility];  Service: Urology;  Laterality: Right;  . ESOPHAGOGASTRODUODENOSCOPY N/A 05/17/2016   Procedure: ESOPHAGOGASTRODUODENOSCOPY (EGD);  Surgeon: Danie Binder, MD;  Location: [facility] END[facility] SUITE;  Service: Endoscopy;  Laterality:  N/A;  830   . INDUCED ABORTION  01-16-2008    OB History    Gravida Para Term Preterm AB Living   3 2 2   1 2    SAB TAB Ectopic Multiple Live Births   1       2     Home Medications    Prior to Admission medications   Medication Sig Start Date End Date Taking? Authorizing Provider  pantoprazole (PROTONIX) 40 MG tablet  Take 1 tablet (40 mg total) by mouth 2 (two) times daily. 01/18/17  Yes Nita Sells, MD  promethazine (PHENERGAN) 25 MG tablet Take 1 tablet (25 mg total) by mouth every 6 (six) hours as needed for nausea. 10/18/16  Yes Varney Biles, MD  ranitidine (ZANTAC) 150 MG tablet Take 150 mg by mouth 2 (two) times daily.   Yes [provider]  ranitidine (ZANTAC) 150 MG/10ML syrup Take 10 mLs (150 mg total) by mouth at bedtime. Patient not taking: Reported on 03/31/2017 01/18/17   Nita Sells, MD   Family History Family History  Problem Relation Age of Onset  . Colon cancer Paternal Uncle        Passed away Jan 15, 2015  . Nephrolithiasis Father   . Hypertension Father   . Nephrolithiasis Sister   . Nephrolithiasis Sister   . Nephrolithiasis Sister   . Hypertension Mother    Social History Social History  Substance Use Topics  . Smoking status: Current Every Day Smoker    Packs/day: 0.50    Years: 20.00    Types: Cigarettes  . Smokeless tobacco: Never Used     Comment: 5-7 cigarettes daily   . Alcohol use 2.4 oz/week    4 Shots of liquor per week     Comment: No ETOH in 4 months; Previously: occasionally, once every 2-3 months.   Allergies   Keflex [cephalexin]; Codeine; Tape; and Vicodin [hydrocodone-acetaminophen]   Review of Systems Review of Systems  Constitutional: Positive for appetite change. Negative for chills and fever.  Cardiovascular: Positive for chest pain.  Gastrointestinal: Positive for abdominal pain, nausea and vomiting.  Genitourinary: Positive for decreased urine volume.  Musculoskeletal: Positive for myalgias (generalized).  All other systems reviewed and are negative.  Physical Exam Updated Vital Signs BP (!) 140/100   Pulse 90   Temp 97.4 F (36.3 C) (Oral)   Resp (!) 28   Ht 5' 4"  (1.626 m)   Wt 133 lb (60.3 kg)   LMP 03/31/2017   SpO2 98%   BMI 22.83 kg/m   Physical Exam  Constitutional: She appears well-developed and  well-nourished. She appears distressed.  Awake, alert, nontoxic appearance. Pt dry heaving.   HENT:  Head: Normocephalic and atraumatic.  Mouth/Throat: Oropharynx is clear and moist. Mucous membranes are dry. No oropharyngeal exudate.  Eyes: Conjunctivae are normal. No scleral icterus.  Neck: Normal range of motion. Neck supple.  Cardiovascular: Normal rate, regular rhythm and intact distal pulses.   Pulmonary/Chest: Effort normal and breath sounds normal. Tachypnea noted. No respiratory distress. She has no wheezes.  Equal chest expansion. Tachypneic and hyperventilating.  Abdominal: Soft. Bowel sounds are normal. She exhibits no mass. There is no tenderness. There is no rebound and no guarding.  Musculoskeletal: Normal range of motion. She exhibits no edema.  Neurological: She is alert.  Speech is clear and goal oriented Moves extremities without ataxia  Skin: Skin is warm and dry. She is not diaphoretic.  Psychiatric: She has a normal mood and affect.  Nursing  note and vitals reviewed.  ED Treatments / Results  DIAGNOSTIC STUDIES:  Oxygen Saturation is 98% on RA, normal by my interpretation.    COORDINATION OF CARE:  12:50 AM Discussed treatment plan with pt at bedside including IV Fluids, blood work, UA, anti-emetics, and pt agreed to plan.  Labs (all labs ordered are listed, but only abnormal results are displayed) Labs Reviewed  URINALYSIS, ROUTINE W REFLEX MICROSCOPIC - Abnormal; Notable for the following:       Result Value   Color, Urine AMBER (*)    APPearance CLOUDY (*)    Glucose, UA 50 (*)    Hgb urine dipstick LARGE (*)    Bilirubin Urine MODERATE (*)    Ketones, ur 20 (*)    Protein, ur 100 (*)    Leukocytes, UA TRACE (*)    Squamous Epithelial / LPF TOO NUMEROUS TO COUNT (*)    Crystals PRESENT (*)    All other components within normal limits  CBC WITH DIFFERENTIAL/PLATELET - Abnormal; Notable for the following:    WBC 19.3 (*)    Neutro Abs 16.1 (*)     Monocytes Absolute 1.2 (*)    All other components within normal limits  COMPREHENSIVE METABOLIC PANEL - Abnormal; Notable for the following:    Potassium 2.8 (*)    Chloride 97 (*)    Glucose, Bld 224 (*)    BUN 21 (*)    Creatinine, Ser 1.37 (*)    Total Protein 8.2 (*)    Albumin 5.1 (*)    GFR calc non Af Amer 50 (*)    GFR calc Af Amer 58 (*)    Anion gap 19 (*)    All other components within normal limits  LIPASE, BLOOD    Radiology No results found.  Procedures Procedures (including critical care time)  Medications Ordered in ED Medications  sodium chloride 0.9 % bolus 1,000 mL (not administered)  promethazine (PHENERGAN) injection 25 mg (not administered)  sodium chloride 0.9 % bolus 1,000 mL (1,000 mLs Intravenous New Bag/Given 04/02/17 0115)  ketorolac (TORADOL) 30 MG/ML injection 30 mg (30 mg Intravenous Given 04/02/17 0117)  metoCLOPramide (REGLAN) injection 10 mg (10 mg Intravenous Given 04/02/17 0115)  diphenhydrAMINE (BENADRYL) injection 25 mg (25 mg Intravenous Given 04/02/17 0116)     Initial Impression / Assessment and Plan / ED Course  I have reviewed the triage vital signs and the nursing notes.  Pertinent labs & imaging results that were available during my care of the patient were reviewed by me and considered in my medical decision making (see chart for details).  Clinical Course as of Apr 02 249  Mon Apr 02, 2017  0101 Preg test neg yesterday  [HM]  0101 Contaminated sample Squamous Epithelial / LPF: (!) TOO NUMEROUS TO COUNT [HM]  0101 Noted.  Pt receiving IV fluids Ketones, ur: (!) 20 [HM]  [id number] Patient with continued emesis.  Will admit for rehydration, potassium correction and symptom management  [HM]    Clinical Course User Index [HM] Carolyn Sylvia, Jarrett Soho, PA-C    Pt presents with Chronic gastritis and persistent vomiting. Patient was evaluated at [facility] yesterday with potassium repletion and symptom management. She reports she is  continued to have symptoms overnight. She returned today with an increasing white blood cell count, worsening hypokalemia and acute kidney injury. As Elevated from Yesterday. Patient Appears Dehydrated with Dry Mucous Membranes. Fluids Given along with Toradol Reglan and Benadryl. Patient Continues to Be Symptomatic but on  Repeat Exam Abdomen Remained Soft. I Do Not Believe She Needs a CT Scan at This Time.  Discussed with Dr. Hal Hope who will admit.    Final Clinical Impressions(s) / ED Diagnoses   Final diagnoses:  Generalized abdominal pain  Intractable vomiting with nausea, unspecified vomiting type  Chronic gastritis without bleeding, unspecified gastritis type  Hypokalemia  AKI (acute kidney injury) ([place])  Dehydration    New Prescriptions New Prescriptions   No medications on file    I personally performed the services described in this documentation, which was scribed in my presence. The recorded information has been reviewed and is accurate.     Darrio Bade, Gwenlyn Perking 04/02/17 Clarisa Kindred    Jola Schmidt, MD 04/02/17 [phone]

## 2017-04-02 NOTE — Care Management Note (Signed)
Case Management Note  Patient Details  Name: [name] MRN: [id number] Date of Birth: 1986-09-18  Subjective/Objective:31 y/o f admitted w/nausea, & vomiting.From home. Has pcp, follows @ [place], has pharmacy.                    Action/Plan:d/c plan home.   Expected Discharge Date:  04/04/17               Expected Discharge Plan:  Home/Self Care  In-House Referral:     Discharge planning Services  CM Consult  Post Acute Care Choice:    Choice offered to:     DME Arranged:    DME Agency:     HH Arranged:    HH Agency:     Status of Service:  In process, will continue to follow  If discussed at Long Length of Stay Meetings, dates discussed:    Additional Comments:  Dessa Phi, RN 04/02/2017, 12:18 PM

## 2017-04-02 NOTE — Progress Notes (Signed)
PROGRESS NOTE    Jeanne Mcneil  QTM:[id number] DOB: 06-Feb-1986 DOA: 04/02/2017 PCP: Nolene Ebbs, MD    Brief Narrative: Jeanne Mcneil is a 31 y.o. female with history of marijuana abuse who was admitted on March 2018 for nausea vomiting with renal failure in the setting of marijuana abuse presents to the ER with complaints of nausea vomiting of the last couple of days. Patient also has been having crampy abdominal discomfort. Denies any diarrhea. Denies any sick contacts or recent travel. Admits to have taken marijuana yesterday. Denies any fever or chills.   ED Course: Patient come to the ER 24 hours ago and was given IV fluids and antiemetics and discharged home. Despite this patient was having vomiting and presents to the ER again. Labs reveal hypokalemia with leukocytosis and UA showing possible UTI. Patient was given 2 L fluid bolus, Reglan and Benadryl in the ER and admitted for further observation. On my exam patient's abdomen appears benign. Patient had some cramps in her lower extremities.   Assessment & Plan:   Active Problems:   UTI (urinary tract infection)   Hypokalemia   Hyperglycemia   Nausea & vomiting   1-UTI, prior history of kidney stone, UA with too numerous to count WBC, RBC .  Continue with IV fluids.  IV ceftriaxone.  Follow urine culture.  Check renal US. NPO in case she needs urology procedure.  Toradol for pain.  WBC trending down.   2-Nausea, vomiting.  In setting marihuana use and also UTI.  IV fluids, antiemetics.  KUB; No evidence of bowel obstruction or ileus. Calculus is seen left of lumbar spine corresponding to ureteropelvic junction calculus noted on prior CT scan.   3-Hypokalemia; replete IV and orally.   4-hyperglycemia; follow Hb -A1c.  Check CBG   5-AKI; improved with fluids.      DVT prophylaxis: SCD.  Code Status: full code.  Family Communication: care discussed with patient  Disposition Plan: home at time of discharge    Consultants:   none   Procedures:   Renal US.    Antimicrobials: ceftriaxone    Subjective: Report generalized pain, and abdominal pain, back pain.  Nausea better   Objective: Vitals:   04/02/17 0020 04/02/17 0312 04/02/17 0325 04/02/17 0549  BP:  (!) 158/89 (!) 133/104 (!) 132/92  Pulse:  73 74 65  Resp:  17 20 16   Temp:   98.2 F (36.8 C)   TempSrc:   Oral   SpO2:  98% 100% 100%  Weight: 60.3 kg (133 lb)  59.1 kg (130 lb 4.7 oz)   Height: 5' 4"  (1.626 m)  5' 2"  (1.575 m)     Intake/Output Summary (Last 24 hours) at 04/02/17 0832 Last data filed at 04/02/17 0600  Gross per 24 hour  Intake           381.67 ml  Output               30 ml  Net           351.67 ml   Filed Weights   04/02/17 0020 04/02/17 0325  Weight: 60.3 kg (133 lb) 59.1 kg (130 lb 4.7 oz)    Examination:  General exam: Appears calm and comfortable  Respiratory system: Clear to auscultation. Respiratory effort normal. Cardiovascular system: S1 & S2 heard, RRR. No JVD, murmurs, rubs, gallops or clicks. No pedal edema. Gastrointestinal system: Abdomen is nondistended, soft and nontender. No organomegaly or masses felt. Normal bowel sounds  heard. Back pain, tender B<L Central nervous system: Alert and oriented. No focal neurological deficits. Extremities: Symmetric 5 x 5 power. Skin: No rashes, lesions or ulcers Psychiatry: Judgement and insight appear normal. Mood & affect appropriate.     Data Reviewed: I have personally reviewed following labs and imaging studies  CBC:  Recent Labs Lab 03/31/17 1200 04/02/17 0113 04/02/17 0543  WBC 12.8* 19.3* 12.5*  NEUTROABS  --  16.1* 10.8*  HGB 12.7 12.8 10.8*  HCT 38.6 37.7 33.4*  MCV 88.7 87.3 88.1  PLT 326 397 794   Basic Metabolic Panel:  Recent Labs Lab 03/31/17 1200 04/02/17 0113 04/02/17 0543  NA 139 140 141  K 3.2* 2.8* 2.9*  CL 102 97* 101  CO2 24 24 26   GLUCOSE 148* 224* 158*  BUN 13 21* 21*  CREATININE 0.99 1.37*  0.95  CALCIUM 9.4 10.1 8.3*  MG  --   --  1.9   GFR: Estimated Creatinine Clearance: 66.6 mL/min (by C-G formula based on SCr of 0.95 mg/dL). Liver Function Tests:  Recent Labs Lab 03/31/17 1200 04/02/17 0113 04/02/17 0543  AST 29 33 23  ALT 16 21 19   ALKPHOS 55 66 48  BILITOT 0.5 0.8 0.7  PROT 7.2 8.2* 6.6  ALBUMIN 4.2 5.1* 3.9    Recent Labs Lab 03/31/17 1200 04/02/17 0113  LIPASE 32 16   No results for input(s): AMMONIA in the last 168 hours. Coagulation Profile: No results for input(s): INR, PROTIME in the last 168 hours. Cardiac Enzymes: No results for input(s): CKTOTAL, CKMB, CKMBINDEX, TROPONINI in the last 168 hours. BNP (last 3 results) No results for input(s): PROBNP in the last 8760 hours. HbA1C: No results for input(s): HGBA1C in the last 72 hours. CBG: No results for input(s): GLUCAP in the last 168 hours. Lipid Profile: No results for input(s): CHOL, HDL, LDLCALC, TRIG, CHOLHDL, LDLDIRECT in the last 72 hours. Thyroid Function Tests: No results for input(s): TSH, T4TOTAL, FREET4, T3FREE, THYROIDAB in the last 72 hours. Anemia Panel: No results for input(s): VITAMINB12, FOLATE, FERRITIN, TIBC, IRON, RETICCTPCT in the last 72 hours. Sepsis Labs: No results for input(s): PROCALCITON, LATICACIDVEN in the last 168 hours.  No results found for this or any previous visit (from the past 240 hour(s)).       Radiology Studies: Dg Abd Acute W/chest  Result Date: 04/02/2017 CLINICAL DATA:  Upper abdominal pain. EXAM: DG ABDOMEN ACUTE W/ 1V CHEST COMPARISON:  CT scan of January 15, 2017. FINDINGS: There is no evidence of dilated bowel loops or free intraperitoneal air. Phleboliths are noted in the pelvis. Calculus is noted to the left of the L2-3 disc space which corresponds to ureteropelvic junction calculus seen on prior CT scan. Phleboliths are noted in the pelvis. Heart size and mediastinal contours are within normal limits. Both lungs are clear.  IMPRESSION: No evidence of bowel obstruction or ileus. Calculus is seen left of lumbar spine corresponding to ureteropelvic junction calculus noted on prior CT scan. No acute cardiopulmonary disease. Electronically Signed   By: Marijo Conception, M.D.   On: 04/02/2017 08:04        Scheduled Meds: . pantoprazole (PROTONIX) IV  40 mg Intravenous Daily   Continuous Infusions: . 0.9 % NaCl with KCl 20 mEq / L 100 mL/hr at 04/02/17 0441  . cefTRIAXone (ROCEPHIN)  IV Stopped (04/02/17 0542)  . potassium chloride 10 mEq (04/02/17 0710)     LOS: 0 days    Time spent: 25 minutes.  Elmarie Shiley, MD [facility] Pager [phone]  If 7PM-7AM, please contact night-coverage [email or web address] Password [facility] 04/02/2017, 8:32 AM

## 2017-04-03 ENCOUNTER — Observation Stay (HOSPITAL_COMMUNITY): Payer: Self-pay

## 2017-04-03 DIAGNOSIS — R1084 Generalized abdominal pain: Secondary | ICD-10-CM

## 2017-04-03 LAB — GLUCOSE, CAPILLARY
GLUCOSE-CAPILLARY: 87 mg/dL (ref 65–99)
Glucose-Capillary: 93 mg/dL (ref 65–99)
Glucose-Capillary: 94 mg/dL (ref 65–99)
Glucose-Capillary: 77 mg/dL (ref 65–99)

## 2017-04-03 LAB — URINE CULTURE

## 2017-04-03 LAB — BASIC METABOLIC PANEL
Anion gap: 4 — ABNORMAL LOW (ref 5–15)
BUN: 13 mg/dL (ref 6–20)
CO2: 24 mmol/L (ref 22–32)
Calcium: 8.1 mg/dL — ABNORMAL LOW (ref 8.9–10.3)
Chloride: 110 mmol/L (ref 101–111)
Creatinine, Ser: 0.74 mg/dL (ref 0.44–1.00)
GFR calc Af Amer: >60 mL/min (ref >60)
GLUCOSE: 85 mg/dL (ref 65–99)
POTASSIUM: 4 mmol/L (ref 3.5–5.1)
Sodium: 138 mmol/L (ref 135–145)

## 2017-04-03 LAB — CBC
HCT: 30.7 % — ABNORMAL LOW (ref 36.0–46.0)
Hemoglobin: 10.2 g/dL — ABNORMAL LOW (ref 12.0–15.0)
MCH: 29.7 pg (ref 26.0–34.0)
MCHC: 33.2 g/dL (ref 30.0–36.0)
MCV: 89.2 fL (ref 78.0–100.0)
PLATELETS: 248 10*3/uL (ref 150–400)
RBC: 3.44 MIL/uL — AB (ref 3.87–5.11)
RDW: 15.5 % (ref 11.5–15.5)
WBC: 8.4 10*3/uL (ref 4.0–10.5)

## 2017-04-03 LAB — HEPATIC FUNCTION PANEL
ALK PHOS: 41 U/L (ref 38–126)
ALT: 28 U/L (ref 14–54)
AST: 30 U/L (ref 15–41)
Albumin: 3 g/dL — ABNORMAL LOW (ref 3.5–5.0)
Total Bilirubin: 0.4 mg/dL (ref 0.3–1.2)
Total Protein: 5.4 g/dL — ABNORMAL LOW (ref 6.5–8.1)

## 2017-04-03 MED ORDER — SENNOSIDES-DOCUSATE SODIUM 8.6-50 MG PO TABS
1.0000 | ORAL_TABLET | Freq: Two times a day (BID) | ORAL | Status: DC
  Administered 2017-04-03: 1 via ORAL
  Filled 2017-04-03 (×2): qty 1

## 2017-04-03 MED ORDER — BISACODYL 10 MG RE SUPP: 10.0000 mg | Freq: Once | RECTAL | Status: DC

## 2017-04-03 MED ORDER — MORPHINE SULFATE (PF) 10 MG/ML IV SOLN: 1.0000 mg | Freq: Once | INTRAVENOUS | Status: AC

## 2017-04-03 MED ORDER — PANTOPRAZOLE SODIUM 40 MG IV SOLR
40.0000 mg | Freq: Two times a day (BID) | INTRAVENOUS | Status: DC
  Administered 2017-04-03 – 2017-04-04 (×2): 40 mg via INTRAVENOUS
  Filled 2017-04-03 (×2): qty 40

## 2017-04-03 MED ORDER — PROMETHAZINE HCL 25 MG/ML IJ SOLN
25.0000 mg | Freq: Four times a day (QID) | INTRAMUSCULAR | Status: DC | PRN
  Administered 2017-04-03 – 2017-04-04 (×2): 25 mg via INTRAVENOUS
  Filled 2017-04-03 (×2): qty 1

## 2017-04-03 MED ORDER — MORPHINE SULFATE (PF) 10 MG/ML IV SOLN
2.0000 mg | INTRAVENOUS | Status: DC | PRN
  Administered 2017-04-03 – 2017-04-04 (×4): 2 mg via INTRAVENOUS
  Filled 2017-04-03 (×4): qty 1

## 2017-04-03 MED ORDER — ONDANSETRON HCL 4 MG/2ML IJ SOLN
4.0000 mg | Freq: Four times a day (QID) | INTRAMUSCULAR | Status: DC | PRN
  Administered 2017-04-03 – 2017-04-04 (×2): 4 mg via INTRAVENOUS
  Filled 2017-04-03 (×2): qty 2

## 2017-04-03 MED ORDER — MORPHINE BOLUS VIA INFUSION: 1.0000 mg | Freq: Once | INTRAVENOUS | Status: DC

## 2017-04-03 NOTE — Consult Note (Signed)
Urology Consult   Physician requesting consult: Niel Hummer  Reason for consult: Nephrolithiasis  History of Present Illness: Jeanne Mcneil is a 31 y.o. female with PMH significant for anxiety, GERD, depression, chronic gastritis, nephrolithiasis, and uterine fibroids who was admitted 04/02/17 for persistent N/V.  Her sx began 3 days prior to admission and also included right sided abdominal and back pain.  She had been seen in the [facility] the day before with the same sx and was treated with IVF, antiemetics, and sent home.  RN noted while in the ED the pt was seen sticking her fingers down her throat trying to make herself vomit. She is a chronic marijuana user and states she last smoked it 4-5 days ago.  Labs in the ED revealed WBC 19.3, Cr 1.37, K 2.8, UA TNTC RBCs and WBCs and nitrite negative.  Urine culture sent. Imaging (KUB, RUS, MRI abd) confirms 64m left UPJ stone with no hydro that was present on CT in 12/2016, benign Bosniak I renal cysts in the left upper pole, small amount of pericholecystic fluid, gallbladder wall thickening, and no gallstones.  Tox screen positive for THC and benzodiazepines.   Pt has received IVF, K replacement, Ceftriaxone, phenergan, zofran, toradol, and morphine.  All labs have improved.   Her issues with abdominal pain and intermittent N/V go back to 2012.  She is currently very anxious and constantly moving around in the bed.  She states she has right sided pain "in the front and back" as well as constant nausea and vomiting.  RN noted that when pt tried to take in some clear liquids she immediately started gagging.  When asked if this situation is similar to her previous stone episodes she says "no. That pain is much more severe".  All she is concerned with is getting her EKG leads off so she can sit in the shower.  She repeat this over and over while writhing in the bed. She had her first BM today after none over the past week.  She denies urinary frequency,  urgency, and dysuria.  She has noted intermittent GLeandofor the past 3-4 days. She is certain her issues are due to her gallbladder.    Past Medical History:  Diagnosis Date  . Anxiety   . Back pain   . Constipation   . Depression   . GERD (gastroesophageal reflux disease)   . Glaucoma of both eyes   . Hematuria   . History of abnormal cervical Pap smear   . History of chronic gastritis   . History of kidney stones   . Hydronephrosis, right   . Renal calculi    bilateral per ct 0902-2017  . Right ureteral stone   . Urgency of urination   . Uterine fibroid   . Uterine polyp     Past Surgical History:  Procedure Laterality Date  . CYSTOSCOPY W/ URETERAL STENT PLACEMENT Right 07/04/2016   Procedure: CYSTOSCOPY WITH RIGHT RETROGRADE PYELOGRAM, RIGHT URETERAL STENT PLACEMENT;  Surgeon: BArdis Hughs MD;  Location: [facility];  Service: Urology;  Laterality: Right;  . CYSTOSCOPY WITH RETROGRADE PYELOGRAM, URETEROSCOPY AND STENT PLACEMENT Left 11/28/2007  . CYSTOSCOPY WITH RETROGRADE PYELOGRAM, URETEROSCOPY AND STENT PLACEMENT Right 08/02/2016   Procedure: CYSTOSCOPY WITH RIGHT  RETROGRADE PYELOGRAM, URETEROSCOPY,  AND STENT EXCHANGE;  Surgeon: BArdis Hughs MD;  [facility]  Service: Urology;  Laterality: Right;  . ESOPHAGOGASTRODUODENOSCOPY N/A 05/17/2016   Procedure: ESOPHAGOGASTRODUODENOSCOPY (EGD);  Surgeon: SMarga Melnick  Fields, MD;  Location: [facility];  Service: Endoscopy;  Laterality: N/A;  830   . INDUCED ABORTION  01-22-08     Current Hospital Medications:  Home meds:  Current Meds  Medication Sig  . pantoprazole (PROTONIX) 40 MG tablet Take 1 tablet (40 mg total) by mouth 2 (two) times daily.  . promethazine (PHENERGAN) 25 MG tablet Take 1 tablet (25 mg total) by mouth every 6 (six) hours as needed for nausea.  . ranitidine (ZANTAC) 150 MG tablet Take 150 mg by mouth 2 (two) times daily.    Scheduled Meds: . bisacodyl  10 mg Rectal Once  .  metoCLOPramide (REGLAN) injection  5 mg Intravenous Q8H  . pantoprazole (PROTONIX) IV  40 mg Intravenous Q12H  . senna-docusate  1 tablet Oral BID   Continuous Infusions: . 0.9 % NaCl with KCl 20 mEq / L 125 mL/hr at 04/03/17 1407  . cefTRIAXone (ROCEPHIN)  IV Stopped (04/03/17 0525)   PRN Meds:.acetaminophen **OR** acetaminophen, morphine injection, ondansetron (ZOFRAN) IV, promethazine  Allergies:  Allergies  Allergen Reactions  . Keflex [Cephalexin] Other (See Comments)    Reaction:  Bladder spasms  . Codeine Hives and Itching  . Tape Other (See Comments)    Reaction:  Burning   . Vicodin [Hydrocodone-Acetaminophen] Hives and Itching    Family History  Problem Relation Age of Onset  . Colon cancer Paternal Uncle        Passed away 2015/01/21  . Nephrolithiasis Father   . Hypertension Father   . Nephrolithiasis Sister   . Nephrolithiasis Sister   . Nephrolithiasis Sister   . Hypertension Mother     Social History:  reports that she has been smoking Cigarettes.  She has a 10.00 pack-year smoking history. She has never used smokeless tobacco. She reports that she drinks about 2.4 oz of alcohol per week . She reports that she does not use drugs.  ROS: A complete review of systems was performed.  All systems are negative except for pertinent findings as noted.  Physical Exam:  Vital signs in last 24 hours: Temp:  [97.8 F (36.6 C)-99.2 F (37.3 C)] 97.8 F (36.6 C) (05/15 0453) Pulse Rate:  [46-60] 46 (05/15 0453) Resp:  [16-18] 16 (05/15 0453) BP: (110-134)/(70-73) 134/73 (05/15 0453) SpO2:  [97 %-98 %] 97 % (05/15 0453) Constitutional:  Alert and oriented, anxious Cardiovascular: Regular rate and rhythm Respiratory: Normal respiratory effort GI: Abdomen is soft, mild tenderness over RUQ, nondistended, no abdominal masses GU: No CVA tenderness Lymphatic: No lymphadenopathy Neurologic: Grossly intact, no focal deficits Psychiatric: Normal mood and affect  Laboratory  Data:   Recent Labs  04/02/17 0113 04/02/17 0543 04/03/17 0611  WBC 19.3* 12.5* 8.4  HGB 12.8 10.8* 10.2*  HCT 37.7 33.4* 30.7*  PLT 397 270 248     Recent Labs  04/02/17 0113 04/02/17 0543 04/02/17 1315 04/02/17 1918 04/03/17 0611  NA 140 141 138 136 138  K 2.8* 2.9* 2.8* 3.4* 4.0  CL 97* 101 102 104 110  GLUCOSE 224* 158* 117* 100* 85  BUN 21* 21* 20 15 13   CALCIUM 10.1 8.3* 8.2* 8.1* 8.1*  CREATININE 1.37* 0.95 0.93 0.66 0.74   Drugs of Abuse     Component Value Date/Time   LABOPIA NONE DETECTED 04/02/2017 0415   COCAINSCRNUR NONE DETECTED 04/02/2017 0415   LABBENZ POSITIVE (A) 04/02/2017 0415   AMPHETMU NONE DETECTED 04/02/2017 0415   THCU POSITIVE (A) 04/02/2017 0415   LABBARB NONE DETECTED 04/02/2017  0415     Results for orders placed or performed during the hospital encounter of 04/02/17 (from the past 24 hour(s))  Glucose, capillary     Status: Abnormal   Collection Time: 04/02/17  6:14 PM  Result Value Ref Range   Glucose-Capillary 123 (H) 65 - 99 mg/dL  Basic metabolic panel     Status: Abnormal   Collection Time: 04/02/17  7:18 PM  Result Value Ref Range   Sodium 136 135 - 145 mmol/L   Potassium 3.4 (L) 3.5 - 5.1 mmol/L   Chloride 104 101 - 111 mmol/L   CO2 26 22 - 32 mmol/L   Glucose, Bld 100 (H) 65 - 99 mg/dL   BUN 15 6 - 20 mg/dL   Creatinine, Ser 0.66 0.44 - 1.00 mg/dL   Calcium 8.1 (L) 8.9 - 10.3 mg/dL   GFR calc non Af Amer >60 >60 mL/min   GFR calc Af Amer >60 >60 mL/min   Anion gap 6 5 - 15  Glucose, capillary     Status: None   Collection Time: 04/02/17  9:54 PM  Result Value Ref Range   Glucose-Capillary 95 65 - 99 mg/dL  CBC     Status: Abnormal   Collection Time: 04/03/17  6:11 AM  Result Value Ref Range   WBC 8.4 4.0 - 10.5 K/uL   RBC 3.44 (L) 3.87 - 5.11 MIL/uL   Hemoglobin 10.2 (L) 12.0 - 15.0 g/dL   HCT 30.7 (L) 36.0 - 46.0 %   MCV 89.2 78.0 - 100.0 fL   MCH 29.7 26.0 - 34.0 pg   MCHC 33.2 30.0 - 36.0 g/dL   RDW 15.5  11.5 - 15.5 %   Platelets 248 150 - 400 K/uL  Basic metabolic panel     Status: Abnormal   Collection Time: 04/03/17  6:11 AM  Result Value Ref Range   Sodium 138 135 - 145 mmol/L   Potassium 4.0 3.5 - 5.1 mmol/L   Chloride 110 101 - 111 mmol/L   CO2 24 22 - 32 mmol/L   Glucose, Bld 85 65 - 99 mg/dL   BUN 13 6 - 20 mg/dL   Creatinine, Ser 0.74 0.44 - 1.00 mg/dL   Calcium 8.1 (L) 8.9 - 10.3 mg/dL   GFR calc non Af Amer >60 >60 mL/min   GFR calc Af Amer >60 >60 mL/min   Anion gap 4 (L) 5 - 15  Hepatic function panel     Status: Abnormal   Collection Time: 04/03/17  6:11 AM  Result Value Ref Range   Total Protein 5.4 (L) 6.5 - 8.1 g/dL   Albumin 3.0 (L) 3.5 - 5.0 g/dL   AST 30 15 - 41 U/L   ALT 28 14 - 54 U/L   Alkaline Phosphatase 41 38 - 126 U/L   Total Bilirubin 0.4 0.3 - 1.2 mg/dL   Bilirubin, Direct <0.1 (L) 0.1 - 0.5 mg/dL   Indirect Bilirubin NOT CALCULATED 0.3 - 0.9 mg/dL  Glucose, capillary     Status: None   Collection Time: 04/03/17  7:35 AM  Result Value Ref Range   Glucose-Capillary 93 65 - 99 mg/dL  Glucose, capillary     Status: None   Collection Time: 04/03/17 11:35 AM  Result Value Ref Range   Glucose-Capillary 87 65 - 99 mg/dL   Recent Results (from the past 240 hour(s))  Culture, Urine     Status: None (Preliminary result)   Collection Time: 04/02/17  4:15 AM  Result Value Ref Range Status   Specimen Description URINE, CLEAN CATCH  Final   Special Requests NONE  Final   Culture   Final    CULTURE REINCUBATED FOR BETTER GROWTH Performed at [facility], [address], [place], [place] [address]    Report Status PENDING  Incomplete    Renal Function:  Recent Labs  03/31/17 1200 04/02/17 0113 04/02/17 0543 04/02/17 1315 04/02/17 1918 04/03/17 0611  CREATININE 0.99 1.37* 0.95 0.93 0.66 0.74   Estimated Creatinine Clearance: 79.1 mL/min (by C-G formula based on SCr of 0.74 mg/dL).  Radiologic Imaging: Mr Abdomen W Wo Contrast  Result  Date: 04/02/2017 CLINICAL DATA:  Abdominal pain, body aches. EXAM: MRI ABDOMEN WITHOUT AND WITH CONTRAST TECHNIQUE: Multiplanar multisequence MR imaging of the abdomen was performed both before and after the administration of intravenous contrast. CONTRAST:  20m MULTIHANCE GADOBENATE DIMEGLUMINE 529 MG/ML IV SOLN COMPARISON:  None. FINDINGS: Lower chest:  Lung bases are clear. Hepatobiliary: No focal hepatic lesion. No no duct dilatation. Small amount pericholecystic fluid. No gallstones evident. Pancreas: Normal pancreatic parenchymal intensity. No ductal dilatation or inflammation. Spleen: Normal spleen. Adrenals/urinary tract: Adrenal glands normal. Round nonenhancing lesion in the upper pole of the LEFT kidney measures 11 mm (image 13, series 903). This lesion is hyperintense on T2 weighted imaging and corresponds to the indeterminate lesion on comparison ultrasound. No additional renal lesions. Stomach/Bowel: Stomach and limited of the small bowel is unremarkable Vascular/Lymphatic: Abdominal aortic normal caliber. No retroperitoneal periportal lymphadenopathy. Musculoskeletal: No aggressive osseous lesion IMPRESSION: 1. Benign Bosniak 1 renal cysts the upper pole of the LEFT kidney corresponds to the indeterminate lesion on comparison ultrasound. 2. Small amount pericholecystic fluid.  No gallstones evident. Electronically Signed   By: SSuzy BouchardM.D.   On: 04/02/2017 16:27   [place]al  Result Date: 04/02/2017 CLINICAL DATA:  Nephrolithiasis EXAM: RENAL / URINARY TRACT ULTRASOUND COMPLETE COMPARISON:  CT abdomen and pelvis January 15, 2017 FINDINGS: Right Kidney: Length: 12.6 cm. Echogenicity and renal cortical thickness are within normal limits. No mass, perinephric fluid, or hydronephrosis visualized. No sonographically demonstrable calculus or ureterectasis. Left Kidney: Length: 11.6 cm. Echogenicity and renal cortical thickness are within normal limits. No perinephric fluid or hydronephrosis  visualized. There is a complex mass in the medial upper pole left kidney which contains multiple thickened septations measuring 1.3 x 1.6 x 1.3 cm. No sonographically demonstrable calculus or ureterectasis. Bladder: Appears normal for degree of bladder distention. IMPRESSION: Somewhat complex appearing mass in the medial upper pole left kidney measuring 1.3 x 1.6 x 1.3 cm. Further evaluation with pre and post contrast MRI or CT should be considered. MRI is preferred in younger patients (due to lack of ionizing radiation) and for evaluating calcified lesion(s). No calculi evident.  Study otherwise unremarkable. Electronically Signed   By: WLowella GripIII M.D.   On: 04/02/2017 10:07   Dg Abd Acute W/chest  Result Date: 04/02/2017 CLINICAL DATA:  Upper abdominal pain. EXAM: DG ABDOMEN ACUTE W/ 1V CHEST COMPARISON:  CT scan of January 15, 2017. FINDINGS: There is no evidence of dilated bowel loops or free intraperitoneal air. Phleboliths are noted in the pelvis. Calculus is noted to the left of the L2-3 disc space which corresponds to ureteropelvic junction calculus seen on prior CT scan. Phleboliths are noted in the pelvis. Heart size and mediastinal contours are within normal limits. Both lungs are clear. IMPRESSION: No evidence of bowel obstruction or ileus. Calculus is seen left of  lumbar spine corresponding to ureteropelvic junction calculus noted on prior CT scan. No acute cardiopulmonary disease. Electronically Signed   By: Marijo Conception, M.D.   On: 04/02/2017 08:04   US Abdomen Limited Ruq  Result Date: 04/03/2017 CLINICAL DATA:  Right upper quadrant pain over the last week. EXAM: US ABDOMEN LIMITED - RIGHT UPPER QUADRANT COMPARISON:  MRI 04/02/2017.  Ultrasound 04/02/2017.  CT 01/15/2017. FINDINGS: Gallbladder: Mild gallbladder wall thickening. No shadowing stones. No Murphy sign. Tiny amount of fluid surrounding the gallbladder. Common bile duct: Diameter: 3 mm, normal Liver: No focal lesion  identified. Within normal limits in parenchymal echogenicity. IMPRESSION: Abnormal study showing mild gallbladder wall thickening and fluid around the gallbladder. No evidence of stone or positive Murphy sign. No ductal dilatation. This examination is indeterminate. Consider nuclear medicine correlation. Electronically Signed   By: Nelson Chimes M.D.   On: 04/03/2017 10:47    Impression/Recommendation: Left UPJ stone with no hydroureteronephrosis--this stone has been in its present position since at least Feb of this year.  Her pain is located on the right not the left and she states it is nothing like previous stone episodes.  Her urine does not appear infected as urine will have increased red and white cells due to the stone, however, would continue ceftriaxone until culture is  reviewed.  Her stone will need to be treated in the near future but as it does not appear to be contributing to her sx it does not need to be addressed urgently.     [place], [place] 04/03/2017, 3:36 PM

## 2017-04-03 NOTE — Progress Notes (Signed)
PROGRESS NOTE    Jeanne Mcneil  FVC:[id number] DOB: 12-Aug-1986 DOA: 04/02/2017 PCP: Nolene Ebbs, MD    Brief Narrative: Jeanne Mcneil is a 31 y.o. female with history of marijuana abuse who was admitted on March 2018 for nausea vomiting with renal failure in the setting of marijuana abuse presents to the ER with complaints of nausea vomiting of the last couple of days. Patient also has been having crampy abdominal discomfort. Denies any diarrhea. Denies any sick contacts or recent travel. Admits to have taken marijuana yesterday. Denies any fever or chills.   ED Course: Patient come to the ER 24 hours ago and was given IV fluids and antiemetics and discharged home. Despite this patient was having vomiting and presents to the ER again. Labs reveal hypokalemia with leukocytosis and UA showing possible UTI. Patient was given 2 L fluid bolus, Reglan and Benadryl in the ER and admitted for further observation. On my exam patient's abdomen appears benign. Patient had some cramps in her lower extremities.   Assessment & Plan:   Active Problems:   UTI (urinary tract infection)   Hypokalemia   Hyperglycemia   Nausea & vomiting   1-UTI, prior history of kidney stone, UA with too numerous to count WBC, RBC .  Continue with IV fluids.  IV ceftriaxone.  Urine culture pending.  KUB with; No evidence of bowel obstruction or ileus. Calculus is seen left of lumbar spine corresponding to ureteropelvic junction calculus noted on prior CT scan.  Renal US; complex cyst, no calculi, no hydronephrosis.  Toradol, Morphine for pain.  WBC trending down.  MRI; Bosniak cyst 1.   2-Nausea, vomiting. Prior history of cyclical vomiting.  In setting marijuana use and also UTI. ? Gallbladder  IV fluids, antiemetics.  KUB; No evidence of bowel obstruction or ileus. Calculus is seen left of lumbar spine corresponding to ureteropelvic junction calculus noted on prior CT scan.  Pericholecystic fluid on MRI.  RUQ; gallbladder thickening, pericholecystic fluids.  Will check HIDA>  Surgery consulted.   3-Hypokalemia; resolved.   4-Hyperglycemia; CBG 224 on admission.  follow Hb -A1c pending.   5-AKI; improved with fluids.      DVT prophylaxis: SCD.  Code Status: full code.  Family Communication: care discussed with patient  Disposition Plan: home at time of discharge   Consultants:   none   Procedures:   Renal US.    Antimicrobials: ceftriaxone    Subjective: She was feeling better this morning. Pain improved.  Report back pain, band like. Also relates abdominal pain right side.   This afternoon patient started to vomit again after trying clear diet.   Objective: Vitals:   04/02/17 0549 04/02/17 1405 04/02/17 2152 04/03/17 0453  BP: (!) 132/92  110/70 134/73  Pulse: 65 60 60 (!) 46  Resp: 16 16 18 16   Temp:  98.6 F (37 C) 99.2 F (37.3 C) 97.8 F (36.6 C)  TempSrc:  Oral Oral Oral  SpO2: 100% 100% 98% 97%  Weight:      Height:        Intake/Output Summary (Last 24 hours) at 04/03/17 0900 Last data filed at 04/03/17 0844  Gross per 24 hour  Intake          3350.42 ml  Output             1900 ml  Net          1450.42 ml   Filed Weights   04/02/17 0020 04/02/17 0325  Weight: 60.3 kg (133 lb) 59.1 kg (130 lb 4.7 oz)    Examination:  General exam: NAD Respiratory system: CTA Cardiovascular system: S 1, S 2 RRR Gastrointestinal system: Soft, mild RUQ tenderness.  Central nervous system: non focal  Extremities: no edema Skin: No rashes, lesions or ulcers     Data Reviewed: I have personally reviewed following labs and imaging studies  CBC:  Recent Labs Lab 03/31/17 1200 04/02/17 0113 04/02/17 0543 04/03/17 0611  WBC 12.8* 19.3* 12.5* 8.4  NEUTROABS  --  16.1* 10.8*  --   HGB 12.7 12.8 10.8* 10.2*  HCT 38.6 37.7 33.4* 30.7*  MCV 88.7 87.3 88.1 89.2  PLT 326 397 270 242   Basic Metabolic Panel:  Recent Labs Lab 04/02/17 0113  04/02/17 0543 04/02/17 1315 04/02/17 1918 04/03/17 0611  NA 140 141 138 136 138  K 2.8* 2.9* 2.8* 3.4* 4.0  CL 97* 101 102 104 110  CO2 24 26 26 26 24   GLUCOSE 224* 158* 117* 100* 85  BUN 21* 21* 20 15 13   CREATININE 1.37* 0.95 0.93 0.66 0.74  CALCIUM 10.1 8.3* 8.2* 8.1* 8.1*  MG  --  1.9  --   --   --    GFR: Estimated Creatinine Clearance: 79.1 mL/min (by C-G formula based on SCr of 0.74 mg/dL). Liver Function Tests:  Recent Labs Lab 03/31/17 1200 04/02/17 0113 04/02/17 0543  AST 29 33 23  ALT 16 21 19   ALKPHOS 55 66 48  BILITOT 0.5 0.8 0.7  PROT 7.2 8.2* 6.6  ALBUMIN 4.2 5.1* 3.9    Recent Labs Lab 03/31/17 1200 04/02/17 0113  LIPASE 32 16   No results for input(s): AMMONIA in the last 168 hours. Coagulation Profile: No results for input(s): INR, PROTIME in the last 168 hours. Cardiac Enzymes: No results for input(s): CKTOTAL, CKMB, CKMBINDEX, TROPONINI in the last 168 hours. BNP (last 3 results) No results for input(s): PROBNP in the last 8760 hours. HbA1C: No results for input(s): HGBA1C in the last 72 hours. CBG:  Recent Labs Lab 04/02/17 1222 04/02/17 1814 04/02/17 2154 04/03/17 0735  GLUCAP 90 123* 95 93   Lipid Profile: No results for input(s): CHOL, HDL, LDLCALC, TRIG, CHOLHDL, LDLDIRECT in the last 72 hours. Thyroid Function Tests: No results for input(s): TSH, T4TOTAL, FREET4, T3FREE, THYROIDAB in the last 72 hours. Anemia Panel: No results for input(s): VITAMINB12, FOLATE, FERRITIN, TIBC, IRON, RETICCTPCT in the last 72 hours. Sepsis Labs: No results for input(s): PROCALCITON, LATICACIDVEN in the last 168 hours.  No results found for this or any previous visit (from the past 240 hour(s)).       Radiology Studies: Mr Abdomen W Wo Contrast  Result Date: 04/02/2017 CLINICAL DATA:  Abdominal pain, body aches. EXAM: MRI ABDOMEN WITHOUT AND WITH CONTRAST TECHNIQUE: Multiplanar multisequence MR imaging of the abdomen was performed both  before and after the administration of intravenous contrast. CONTRAST:  51m MULTIHANCE GADOBENATE DIMEGLUMINE 529 MG/ML IV SOLN COMPARISON:  None. FINDINGS: Lower chest:  Lung bases are clear. Hepatobiliary: No focal hepatic lesion. No no duct dilatation. Small amount pericholecystic fluid. No gallstones evident. Pancreas: Normal pancreatic parenchymal intensity. No ductal dilatation or inflammation. Spleen: Normal spleen. Adrenals/urinary tract: Adrenal glands normal. Round nonenhancing lesion in the upper pole of the LEFT kidney measures 11 mm (image 13, series 903). This lesion is hyperintense on T2 weighted imaging and corresponds to the indeterminate lesion on comparison ultrasound. No additional renal lesions. Stomach/Bowel: Stomach and limited of  the small bowel is unremarkable Vascular/Lymphatic: Abdominal aortic normal caliber. No retroperitoneal periportal lymphadenopathy. Musculoskeletal: No aggressive osseous lesion IMPRESSION: 1. Benign Bosniak 1 renal cysts the upper pole of the LEFT kidney corresponds to the indeterminate lesion on comparison ultrasound. 2. Small amount pericholecystic fluid.  No gallstones evident. Electronically Signed   By: Suzy Bouchard M.D.   On: 04/02/2017 16:27   US Renal  Result Date: 04/02/2017 CLINICAL DATA:  Nephrolithiasis EXAM: RENAL / URINARY TRACT ULTRASOUND COMPLETE COMPARISON:  CT abdomen and pelvis January 15, 2017 FINDINGS: Right Kidney: Length: 12.6 cm. Echogenicity and renal cortical thickness are within normal limits. No mass, perinephric fluid, or hydronephrosis visualized. No sonographically demonstrable calculus or ureterectasis. Left Kidney: Length: 11.6 cm. Echogenicity and renal cortical thickness are within normal limits. No perinephric fluid or hydronephrosis visualized. There is a complex mass in the medial upper pole left kidney which contains multiple thickened septations measuring 1.3 x 1.6 x 1.3 cm. No sonographically demonstrable calculus  or ureterectasis. Bladder: Appears normal for degree of bladder distention. IMPRESSION: Somewhat complex appearing mass in the medial upper pole left kidney measuring 1.3 x 1.6 x 1.3 cm. Further evaluation with pre and post contrast MRI or CT should be considered. MRI is preferred in younger patients (due to lack of ionizing radiation) and for evaluating calcified lesion(s). No calculi evident.  Study otherwise unremarkable. Electronically Signed   By: Lowella Grip III M.D.   On: 04/02/2017 10:07   Dg Abd Acute W/chest  Result Date: 04/02/2017 CLINICAL DATA:  Upper abdominal pain. EXAM: DG ABDOMEN ACUTE W/ 1V CHEST COMPARISON:  CT scan of January 15, 2017. FINDINGS: There is no evidence of dilated bowel loops or free intraperitoneal air. Phleboliths are noted in the pelvis. Calculus is noted to the left of the L2-3 disc space which corresponds to ureteropelvic junction calculus seen on prior CT scan. Phleboliths are noted in the pelvis. Heart size and mediastinal contours are within normal limits. Both lungs are clear. IMPRESSION: No evidence of bowel obstruction or ileus. Calculus is seen left of lumbar spine corresponding to ureteropelvic junction calculus noted on prior CT scan. No acute cardiopulmonary disease. Electronically Signed   By: Marijo Conception, M.D.   On: 04/02/2017 08:04        Scheduled Meds: . bisacodyl  10 mg Rectal Once  . metoCLOPramide (REGLAN) injection  5 mg Intravenous Q8H  . pantoprazole (PROTONIX) IV  40 mg Intravenous Daily  . senna-docusate  1 tablet Oral BID   Continuous Infusions: . 0.9 % NaCl with KCl 20 mEq / L 125 mL/hr at 04/03/17 0754  . cefTRIAXone (ROCEPHIN)  IV Stopped (04/03/17 0525)     LOS: 0 days    Time spent: 25 minutes.     Elmarie Shiley, MD [facility] Pager [phone]  If 7PM-7AM, please contact night-coverage [email or web address] Password [email or web address] 04/03/2017, 9:00 AM

## 2017-04-03 NOTE — Consult Note (Signed)
Reason for Consult: Possible cholecystitis CC:  Abdominal pain Referring Physician: B Regalado   Jeanne Mcneil is an 31 y.o. female.  HPI: 31 year old female presents to the ED with recurrent abdominal pain. She has multiple ED visits and admissions for abdominal pain going back to 2012. She presented to the ED yesterday 04/02/17 with abdominal pain.  She has a history of chronic abdominal pain, nephrolithiasis, gastritis, history of uterine fibroids. She was seen in the ED on 03/31/2017. She was noted to be gagging herself in the ED at that time. She returned again on 5/14 with the same complaints. She stated she had decreased urine production, persistent emesis, persistent total body muscle cramps severe nausea, vomiting and chest pain with vomiting. She reports having fever but is never taken her temperature. She says she feels hot and cold at the same time. She reports not being able to eat for long periods 18-35 days at a time. She says during those periods she has severe nausea and vomiting and only drinks enough to wet her lips.  She has a history of marijuana use and in March her nausea and vomitus was attributed to this on her last visit. She has had multiple ED visits and hospital admissions for abdominal complaints since 2012.Marland Kitchen She has admitted to marijuana use day prior to admission 04/01/17 she was admitted for intractable nausea and vomiting,possible UTI. She underwent EGD on 05/17/2016 by Dr. Lovey Newcomer fields, findings:HEMATEMESIS DUE TO LA Grade A reflux esophagitis. MILD Gastritis.  Medium-sized hiatal hernia.  NAUSEA/VOMITING AND EPIGASTRIC PAIN DUE TO REFLUX.  Work up in the ED shows she is afebrile and hypertensive on admission. Potassium is 3.2 on 03/31/17 evaluation. Repeat CMP on 5/13 shows potassium is 2.9. Creatinine is normal glucose elevated at 158. WBC is 12.5. Hemoglobin 10.8, hematocrit 33.4. Urinalysis shows TNTC WBC nitrates are negative. TNTC RBC. Drug screen on admission 5/13  positive for benzodiazepines and TCH. Urine pregnancy is negative. Three-way acute chest and abdomen shows no evidence of bowel obstruction or ileus nephrolithiasis seen on the left. Renal ultrasound shows a complex mass appearing medial upper pole left kidney that is a benign cyst. MR was recommended to evaluate this. MR confirms a benign Bosniak one renal cyst left pole the left kidney corresponds to the indeterminate lesion on comparison the ultrasound. Small amount of pericholecystic fluid with no gallstones evident. A renal ultrasound obtained today 04/02/17 shows mild gallbladder wall thickening no shadowing stones no Murphy sign tiny amount of fluid surrounding the gallbladder. Bile duct was normal at 3 mm. We are asked to see to evaluate for possible cholecystitis. HIDA scan is pending. She reports being 220 pounds last year, and down to the 120s issue. Weight in February 2018 was 133 current weight is 130.  Past Medical History:  Diagnosis Date  . Anxiety   . Back pain   . Constipation   . Depression   . GERD (gastroesophageal reflux disease)   . Glaucoma of both eyes   . Hematuria   . History of abnormal cervical Pap smear   . History of chronic gastritis   . History of kidney stones   . Hydronephrosis, right   . Renal calculi    bilateral per ct [id number]  . Right ureteral stone   . Urgency of urination   . Uterine fibroid   . Uterine polyp     Past Surgical History:  Procedure Laterality Date  . CYSTOSCOPY W/ URETERAL STENT PLACEMENT Right 07/04/2016   Procedure:  CYSTOSCOPY WITH RIGHT RETROGRADE PYELOGRAM, RIGHT URETERAL STENT PLACEMENT;  Surgeon: Ardis Hughs, MD;  Location: [facility];  Service: Urology;  Laterality: Right;  . CYSTOSCOPY WITH RETROGRADE PYELOGRAM, URETEROSCOPY AND STENT PLACEMENT Left 11/28/2007  . CYSTOSCOPY WITH RETROGRADE PYELOGRAM, URETEROSCOPY AND STENT PLACEMENT Right 08/02/2016   Procedure: CYSTOSCOPY WITH RIGHT  RETROGRADE PYELOGRAM, URETEROSCOPY,   AND STENT EXCHANGE;  Surgeon: Ardis Hughs, MD;  Location: [facility];  Service: Urology;  Laterality: Right;  . ESOPHAGOGASTRODUODENOSCOPY N/A 05/17/2016   Procedure: ESOPHAGOGASTRODUODENOSCOPY (EGD);  Surgeon: Danie Binder, MD;  Location: [facility];  Service: Endoscopy;  Laterality: N/A;  830   . INDUCED ABORTION  02/12/08    Family History  Problem Relation Age of Onset  . Colon cancer Paternal Uncle        Passed away 02/11/15  . Nephrolithiasis Father   . Hypertension Father   . Nephrolithiasis Sister   . Nephrolithiasis Sister   . Nephrolithiasis Sister   . Hypertension Mother     Social History:  reports that she has been smoking Cigarettes.  She has a 10.00 pack-year smoking history. She has never used smokeless tobacco. She reports that she drinks about 2.4 oz of alcohol per week . She reports that she does not use drugs.  Allergies:  Allergies  Allergen Reactions  . Keflex [Cephalexin] Other (See Comments)    Reaction:  Bladder spasms  . Codeine Hives and Itching  . Tape Other (See Comments)    Reaction:  Burning   . Vicodin [Hydrocodone-Acetaminophen] Hives and Itching    Medications:  Prior to Admission:  Prescriptions Prior to Admission  Medication Sig Dispense Refill Last Dose  . pantoprazole (PROTONIX) 40 MG tablet Take 1 tablet (40 mg total) by mouth 2 (two) times daily. 60 tablet 2 Past Week at Unknown time  . promethazine (PHENERGAN) 25 MG tablet Take 1 tablet (25 mg total) by mouth every 6 (six) hours as needed for nausea. 30 tablet 0 Past Week at Unknown time  . ranitidine (ZANTAC) 150 MG tablet Take 150 mg by mouth 2 (two) times daily.   Past Week at Unknown time  . ranitidine (ZANTAC) 150 MG/10ML syrup Take 10 mLs (150 mg total) by mouth at bedtime. (Patient not taking: Reported on 03/31/2017) 473 mL 0 Not Taking at Unknown time    Results for orders placed or performed during the hospital encounter of 04/02/17 (from the past 48  hour(s))  Urinalysis, Routine w reflex microscopic     Status: Abnormal   Collection Time: 04/02/17 12:27 AM  Result Value Ref Range   Color, Urine AMBER (A) YELLOW    Comment: BIOCHEMICALS MAY BE AFFECTED BY COLOR   APPearance CLOUDY (A) CLEAR   Specific Gravity, Urine 1.029 1.005 - 1.030   pH 5.0 5.0 - 8.0   Glucose, UA 50 (A) NEGATIVE mg/dL   Hgb urine dipstick LARGE (A) NEGATIVE   Bilirubin Urine MODERATE (A) NEGATIVE   Ketones, ur 20 (A) NEGATIVE mg/dL   Protein, ur 100 (A) NEGATIVE mg/dL   Nitrite NEGATIVE NEGATIVE   Leukocytes, UA TRACE (A) NEGATIVE   RBC / HPF TOO NUMEROUS TO COUNT 0 - 5 RBC/hpf   WBC, UA TOO NUMEROUS TO COUNT 0 - 5 WBC/hpf   Bacteria, UA NONE SEEN NONE SEEN   Squamous Epithelial / LPF TOO NUMEROUS TO COUNT (A) NONE SEEN   WBC Clumps PRESENT    Mucous PRESENT    Crystals PRESENT (A) NEGATIVE  CBC with Differential     Status: Abnormal   Collection Time: 04/02/17  1:13 AM  Result Value Ref Range   WBC 19.3 (H) 4.0 - 10.5 K/uL   RBC 4.32 3.87 - 5.11 MIL/uL   Hemoglobin 12.8 12.0 - 15.0 g/dL   HCT 37.7 36.0 - 46.0 %   MCV 87.3 78.0 - 100.0 fL   MCH 29.6 26.0 - 34.0 pg   MCHC 34.0 30.0 - 36.0 g/dL   RDW 15.1 11.5 - 15.5 %   Platelets 397 150 - 400 K/uL   Neutrophils Relative % 84 %   Neutro Abs 16.1 (H) 1.7 - 7.7 K/uL   Lymphocytes Relative 10 %   Lymphs Abs 1.9 0.7 - 4.0 K/uL   Monocytes Relative 6 %   Monocytes Absolute 1.2 (H) 0.1 - 1.0 K/uL   Eosinophils Relative 0 %   Eosinophils Absolute 0.0 0.0 - 0.7 K/uL   Basophils Relative 0 %   Basophils Absolute 0.1 0.0 - 0.1 K/uL  Comprehensive metabolic panel     Status: Abnormal   Collection Time: 04/02/17  1:13 AM  Result Value Ref Range   Sodium 140 135 - 145 mmol/L   Potassium 2.8 (L) 3.5 - 5.1 mmol/L   Chloride 97 (L) 101 - 111 mmol/L   CO2 24 22 - 32 mmol/L   Glucose, Bld 224 (H) 65 - 99 mg/dL   BUN 21 (H) 6 - 20 mg/dL   Creatinine, Ser 1.37 (H) 0.44 - 1.00 mg/dL   Calcium 10.1 8.9 - 10.3  mg/dL   Total Protein 8.2 (H) 6.5 - 8.1 g/dL   Albumin 5.1 (H) 3.5 - 5.0 g/dL   AST 33 15 - 41 U/L   ALT 21 14 - 54 U/L   Alkaline Phosphatase 66 38 - 126 U/L   Total Bilirubin 0.8 0.3 - 1.2 mg/dL   GFR calc non Af Amer 50 (L) >60 mL/min   GFR calc Af Amer 58 (L) >60 mL/min    Comment: (NOTE) The eGFR has been calculated using the CKD EPI equation. This calculation has not been validated in all clinical situations. eGFR's persistently <60 mL/min signify possible Chronic Kidney Disease.    Anion gap 19 (H) 5 - 15  Lipase, blood     Status: None   Collection Time: 04/02/17  1:13 AM  Result Value Ref Range   Lipase 16 11 - 51 U/L  Culture, Urine     Status: None (Preliminary result)   Collection Time: 04/02/17  4:15 AM  Result Value Ref Range   Specimen Description URINE, CLEAN CATCH    Special Requests NONE    Culture      CULTURE REINCUBATED FOR BETTER GROWTH Performed at [facility], [employer] [address], [place], [place] [address]    Report Status PENDING   Urine rapid drug screen (hosp performed)     Status: Abnormal   Collection Time: 04/02/17  4:15 AM  Result Value Ref Range   Opiates NONE DETECTED NONE DETECTED   Cocaine NONE DETECTED NONE DETECTED   Benzodiazepines POSITIVE (A) NONE DETECTED   Amphetamines NONE DETECTED NONE DETECTED   Tetrahydrocannabinol POSITIVE (A) NONE DETECTED   Barbiturates NONE DETECTED NONE DETECTED    Comment:        DRUG SCREEN FOR MEDICAL PURPOSES ONLY.  IF CONFIRMATION IS NEEDED FOR ANY PURPOSE, NOTIFY LAB WITHIN 5 DAYS.        LOWEST DETECTABLE LIMITS FOR URINE DRUG SCREEN Drug  Class       Cutoff (ng/mL) Amphetamine      1000 Barbiturate      200 Benzodiazepine   703 Tricyclics       500 Opiates          300 Cocaine          300 THC              50   Comprehensive metabolic panel     Status: Abnormal   Collection Time: 04/02/17  5:43 AM  Result Value Ref Range   Sodium 141 135 - 145 mmol/L   Potassium 2.9 (L) 3.5 - 5.1  mmol/L   Chloride 101 101 - 111 mmol/L   CO2 26 22 - 32 mmol/L   Glucose, Bld 158 (H) 65 - 99 mg/dL   BUN 21 (H) 6 - 20 mg/dL   Creatinine, Ser 0.95 0.44 - 1.00 mg/dL   Calcium 8.3 (L) 8.9 - 10.3 mg/dL   Total Protein 6.6 6.5 - 8.1 g/dL   Albumin 3.9 3.5 - 5.0 g/dL   AST 23 15 - 41 U/L   ALT 19 14 - 54 U/L   Alkaline Phosphatase 48 38 - 126 U/L   Total Bilirubin 0.7 0.3 - 1.2 mg/dL   GFR calc non Af Amer >60 >60 mL/min   GFR calc Af Amer >60 >60 mL/min    Comment: (NOTE) The eGFR has been calculated using the CKD EPI equation. This calculation has not been validated in all clinical situations. eGFR's persistently <60 mL/min signify possible Chronic Kidney Disease.    Anion gap 14 5 - 15  Magnesium     Status: None   Collection Time: 04/02/17  5:43 AM  Result Value Ref Range   Magnesium 1.9 1.7 - 2.4 mg/dL  CBC with Differential/Platelet     Status: Abnormal   Collection Time: 04/02/17  5:43 AM  Result Value Ref Range   WBC 12.5 (H) 4.0 - 10.5 K/uL   RBC 3.79 (L) 3.87 - 5.11 MIL/uL   Hemoglobin 10.8 (L) 12.0 - 15.0 g/dL   HCT 33.4 (L) 36.0 - 46.0 %   MCV 88.1 78.0 - 100.0 fL   MCH 28.5 26.0 - 34.0 pg   MCHC 32.3 30.0 - 36.0 g/dL   RDW 15.2 11.5 - 15.5 %   Platelets 270 150 - 400 K/uL   Neutrophils Relative % 87 %   Neutro Abs 10.8 (H) 1.7 - 7.7 K/uL   Lymphocytes Relative 8 %   Lymphs Abs 1.1 0.7 - 4.0 K/uL   Monocytes Relative 5 %   Monocytes Absolute 0.7 0.1 - 1.0 K/uL   Eosinophils Relative 0 %   Eosinophils Absolute 0.0 0.0 - 0.7 K/uL   Basophils Relative 0 %   Basophils Absolute 0.0 0.0 - 0.1 K/uL  Pregnancy, urine     Status: None   Collection Time: 04/02/17  5:59 AM  Result Value Ref Range   Preg Test, Ur NEGATIVE NEGATIVE    Comment:        THE SENSITIVITY OF THIS METHODOLOGY IS >20 mIU/mL.   Glucose, capillary     Status: None   Collection Time: 04/02/17 12:22 PM  Result Value Ref Range   Glucose-Capillary 90 65 - 99 mg/dL  Basic metabolic panel      Status: Abnormal   Collection Time: 04/02/17  1:15 PM  Result Value Ref Range   Sodium 138 135 - 145 mmol/L   Potassium 2.8 (L)  3.5 - 5.1 mmol/L   Chloride 102 101 - 111 mmol/L   CO2 26 22 - 32 mmol/L   Glucose, Bld 117 (H) 65 - 99 mg/dL   BUN 20 6 - 20 mg/dL   Creatinine, Ser 0.93 0.44 - 1.00 mg/dL   Calcium 8.2 (L) 8.9 - 10.3 mg/dL   GFR calc non Af Amer >60 >60 mL/min   GFR calc Af Amer >60 >60 mL/min    Comment: (NOTE) The eGFR has been calculated using the CKD EPI equation. This calculation has not been validated in all clinical situations. eGFR's persistently <60 mL/min signify possible Chronic Kidney Disease.    Anion gap 10 5 - 15  Glucose, capillary     Status: Abnormal   Collection Time: 04/02/17  6:14 PM  Result Value Ref Range   Glucose-Capillary 123 (H) 65 - 99 mg/dL  Basic metabolic panel     Status: Abnormal   Collection Time: 04/02/17  7:18 PM  Result Value Ref Range   Sodium 136 135 - 145 mmol/L   Potassium 3.4 (L) 3.5 - 5.1 mmol/L    Comment: DELTA CHECK NOTED REPEATED TO VERIFY NO VISIBLE HEMOLYSIS    Chloride 104 101 - 111 mmol/L   CO2 26 22 - 32 mmol/L   Glucose, Bld 100 (H) 65 - 99 mg/dL   BUN 15 6 - 20 mg/dL   Creatinine, Ser 0.66 0.44 - 1.00 mg/dL   Calcium 8.1 (L) 8.9 - 10.3 mg/dL   GFR calc non Af Amer >60 >60 mL/min   GFR calc Af Amer >60 >60 mL/min    Comment: (NOTE) The eGFR has been calculated using the CKD EPI equation. This calculation has not been validated in all clinical situations. eGFR's persistently <60 mL/min signify possible Chronic Kidney Disease.    Anion gap 6 5 - 15  Glucose, capillary     Status: None   Collection Time: 04/02/17  9:54 PM  Result Value Ref Range   Glucose-Capillary 95 65 - 99 mg/dL  CBC     Status: Abnormal   Collection Time: 04/03/17  6:11 AM  Result Value Ref Range   WBC 8.4 4.0 - 10.5 K/uL   RBC 3.44 (L) 3.87 - 5.11 MIL/uL   Hemoglobin 10.2 (L) 12.0 - 15.0 g/dL   HCT 30.7 (L) 36.0 - 46.0 %   MCV  89.2 78.0 - 100.0 fL   MCH 29.7 26.0 - 34.0 pg   MCHC 33.2 30.0 - 36.0 g/dL   RDW 15.5 11.5 - 15.5 %   Platelets 248 150 - 400 K/uL  Basic metabolic panel     Status: Abnormal   Collection Time: 04/03/17  6:11 AM  Result Value Ref Range   Sodium 138 135 - 145 mmol/L   Potassium 4.0 3.5 - 5.1 mmol/L   Chloride 110 101 - 111 mmol/L   CO2 24 22 - 32 mmol/L   Glucose, Bld 85 65 - 99 mg/dL   BUN 13 6 - 20 mg/dL   Creatinine, Ser 0.74 0.44 - 1.00 mg/dL   Calcium 8.1 (L) 8.9 - 10.3 mg/dL   GFR calc non Af Amer >60 >60 mL/min   GFR calc Af Amer >60 >60 mL/min    Comment: (NOTE) The eGFR has been calculated using the CKD EPI equation. This calculation has not been validated in all clinical situations. eGFR's persistently <60 mL/min signify possible Chronic Kidney Disease.    Anion gap 4 (L) 5 - 15  Hepatic function  panel     Status: Abnormal   Collection Time: 04/03/17  6:11 AM  Result Value Ref Range   Total Protein 5.4 (L) 6.5 - 8.1 g/dL   Albumin 3.0 (L) 3.5 - 5.0 g/dL   AST 30 15 - 41 U/L   ALT 28 14 - 54 U/L   Alkaline Phosphatase 41 38 - 126 U/L   Total Bilirubin 0.4 0.3 - 1.2 mg/dL   Bilirubin, Direct <0.1 (L) 0.1 - 0.5 mg/dL   Indirect Bilirubin NOT CALCULATED 0.3 - 0.9 mg/dL  Glucose, capillary     Status: None   Collection Time: 04/03/17  7:35 AM  Result Value Ref Range   Glucose-Capillary 93 65 - 99 mg/dL  Glucose, capillary     Status: None   Collection Time: 04/03/17 11:35 AM  Result Value Ref Range   Glucose-Capillary 87 65 - 99 mg/dL    Mr Abdomen W Wo Contrast  Result Date: 04/02/2017 CLINICAL DATA:  Abdominal pain, body aches. EXAM: MRI ABDOMEN WITHOUT AND WITH CONTRAST TECHNIQUE: Multiplanar multisequence MR imaging of the abdomen was performed both before and after the administration of intravenous contrast. CONTRAST:  37m MULTIHANCE GADOBENATE DIMEGLUMINE 529 MG/ML IV SOLN COMPARISON:  None. FINDINGS: Lower chest:  Lung bases are clear. Hepatobiliary: No  focal hepatic lesion. No no duct dilatation. Small amount pericholecystic fluid. No gallstones evident. Pancreas: Normal pancreatic parenchymal intensity. No ductal dilatation or inflammation. Spleen: Normal spleen. Adrenals/urinary tract: Adrenal glands normal. Round nonenhancing lesion in the upper pole of the LEFT kidney measures 11 mm (image 13, series 903). This lesion is hyperintense on T2 weighted imaging and corresponds to the indeterminate lesion on comparison ultrasound. No additional renal lesions. Stomach/Bowel: Stomach and limited of the small bowel is unremarkable Vascular/Lymphatic: Abdominal aortic normal caliber. No retroperitoneal periportal lymphadenopathy. Musculoskeletal: No aggressive osseous lesion IMPRESSION: 1. Benign Bosniak 1 renal cysts the upper pole of the LEFT kidney corresponds to the indeterminate lesion on comparison ultrasound. 2. Small amount pericholecystic fluid.  No gallstones evident. Electronically Signed   By: SSuzy BouchardM.D.   On: 04/02/2017 16:27   [place]  Result Date: 04/02/2017 CLINICAL DATA:  Nephrolithiasis EXAM: RENAL / URINARY TRACT ULTRASOUND COMPLETE COMPARISON:  CT abdomen and pelvis January 15, 2017 FINDINGS: Right Kidney: Length: 12.6 cm. Echogenicity and renal cortical thickness are within normal limits. No mass, perinephric fluid, or hydronephrosis visualized. No sonographically demonstrable calculus or ureterectasis. Left Kidney: Length: 11.6 cm. Echogenicity and renal cortical thickness are within normal limits. No perinephric fluid or hydronephrosis visualized. There is a complex mass in the medial upper pole left kidney which contains multiple thickened septations measuring 1.3 x 1.6 x 1.3 cm. No sonographically demonstrable calculus or ureterectasis. Bladder: Appears normal for degree of bladder distention. IMPRESSION: Somewhat complex appearing mass in the medial upper pole left kidney measuring 1.3 x 1.6 x 1.3 cm. Further evaluation with  pre and post contrast MRI or CT should be considered. MRI is preferred in younger patients (due to lack of ionizing radiation) and for evaluating calcified lesion(s). No calculi evident.  Study otherwise unremarkable. Electronically Signed   By: WLowella GripIII M.D.   On: 04/02/2017 10:07   Dg Abd Acute W/chest  Result Date: 04/02/2017 CLINICAL DATA:  Upper abdominal pain. EXAM: DG ABDOMEN ACUTE W/ 1V CHEST COMPARISON:  CT scan of January 15, 2017. FINDINGS: There is no evidence of dilated bowel loops or free intraperitoneal air. Phleboliths are noted in the pelvis. Calculus is noted  to the left of the L2-3 disc space which corresponds to ureteropelvic junction calculus seen on prior CT scan. Phleboliths are noted in the pelvis. Heart size and mediastinal contours are within normal limits. Both lungs are clear. IMPRESSION: No evidence of bowel obstruction or ileus. Calculus is seen left of lumbar spine corresponding to ureteropelvic junction calculus noted on prior CT scan. No acute cardiopulmonary disease. Electronically Signed   By: Marijo Conception, M.D.   On: 04/02/2017 08:04   US Abdomen Limited Ruq  Result Date: 04/03/2017 CLINICAL DATA:  Right upper quadrant pain over the last week. EXAM: US ABDOMEN LIMITED - RIGHT UPPER QUADRANT COMPARISON:  MRI 04/02/2017.  Ultrasound 04/02/2017.  CT 01/15/2017. FINDINGS: Gallbladder: Mild gallbladder wall thickening. No shadowing stones. No Murphy sign. Tiny amount of fluid surrounding the gallbladder. Common bile duct: Diameter: 3 mm, normal Liver: No focal lesion identified. Within normal limits in parenchymal echogenicity. IMPRESSION: Abnormal study showing mild gallbladder wall thickening and fluid around the gallbladder. No evidence of stone or positive Murphy sign. No ductal dilatation. This examination is indeterminate. Consider nuclear medicine correlation. Electronically Signed   By: Nelson Chimes M.D.   On: 04/03/2017 10:47    Review of Systems   Constitutional: Positive for chills and weight loss. Negative for fever.  HENT: Positive for hearing loss.   Eyes: Negative.   Respiratory: Negative for cough, hemoptysis, sputum production, shortness of breath and wheezing.   Cardiovascular: Positive for chest pain and palpitations. Negative for orthopnea, claudication, leg swelling and PND.  Gastrointestinal: Positive for abdominal pain, constipation, heartburn, nausea and vomiting. Negative for blood in stool, diarrhea and melena.  Genitourinary: Negative.   Musculoskeletal: Positive for back pain and myalgias.       Pain both lower legs  Skin: Negative.   Neurological: Positive for dizziness, seizures (pt reports this in ambulance) and headaches. Loss of consciousness: she is not sure.  Endo/Heme/Allergies: Negative.   Psychiatric/Behavioral: Positive for depression and substance abuse. The patient is nervous/anxious.        Patient reports PTSD, followed by [facility].   Blood pressure 134/73, pulse (!) 46, temperature 97.8 F (36.6 C), temperature source Oral, resp. rate 16, height _0  (1.575 m), weight 59.1 kg (130 lb 4.7 oz), last menstrual period 03/31/2017, SpO2 97 %. Physical Exam  Constitutional: She is oriented to person, place, and time. She appears well-developed and well-nourished. No distress.  HENT:  Head: Normocephalic and atraumatic.  Mouth/Throat: No oropharyngeal exudate.  Eyes: Right eye exhibits no discharge. Left eye exhibits no discharge. No scleral icterus.  Pupils are equal  Neck: Normal range of motion. Neck supple. No JVD present. No tracheal deviation present. No thyromegaly present.  Cardiovascular: Normal rate, regular rhythm, normal heart sounds and intact distal pulses.   Respiratory: Effort normal and breath sounds normal. No respiratory distress. She has no wheezes. She has no rales. She exhibits no tenderness.  GI: Soft. Bowel sounds are normal. She exhibits no distension and no  mass. There is tenderness (She is intermittently tender and complains mostly of right upper quadrant.). There is no rebound and no guarding.  Musculoskeletal: She exhibits no edema or tenderness.  Neurological: She is alert and oriented to person, place, and time. No cranial nerve deficit.  Skin: Skin is warm and dry. No rash noted. She is not diaphoretic. No erythema. No pallor.  Psychiatric:  Patient's extremely anxious. When I came to see her she was in the shower and stayed in  there for approximately 45 minutes. She says that relaxes her abdominal discomfort. She appears to be a single mother with an 55 year old 75 year old and not much support.    Assessment/Plan: Chronic abdominal pain Gallbladder wall thickening/pericholecystic fluid/no gallstones Nephrolithiasis GERD Chronic gastritis Uterine fibroids Anxiety/PTSD Ongoing tobacco use  Plan: Her symptoms do not sound like they're related to cholecystitis or cholelithiasis. HIDA scan is pending. We will see her and review after the study is completed.  Jeanne Mcneil 04/03/2017, 1:46 PM

## 2017-04-03 NOTE — Progress Notes (Signed)
Patient continues to have severe 10/10 pain not relieved with PRN medications.  MD paged, orders received. Will continue to monitor. Sajan [place] A

## 2017-04-03 NOTE — Progress Notes (Signed)
Initial Nutrition Assessment  DOCUMENTATION CODES:   Not applicable  INTERVENTION:  - Continue FLD and to encourage PO intakes as tolerated. - RD will monitor for additional nutrition-related needs.  NUTRITION DIAGNOSIS:   Inadequate oral intake related to acute illness, nausea, vomiting as evidenced by per patient/family report.  GOAL:   Patient will meet greater than or equal to 90% of their needs  MONITOR:   PO intake, Diet advancement, Weight trends, Labs, I & O's  REASON FOR ASSESSMENT:   Malnutrition Screening Tool  ASSESSMENT:   31 y.o. female with history of marijuana abuse who was admitted on March 2018 for nausea vomiting with renal failure in the setting of marijuana abuse presents to the ER with complaints of nausea vomiting of the last couple of days. Patient also has been having crampy abdominal discomfort. Denies any diarrhea. Denies any sick contacts or recent travel. Admits to have taken marijuana yesterday.   Pt seen for MST. BMI indicates normal weight. Pt with severe abdominal pain and severe nausea with associated vomiting for several days PTA. Pt with chronic gastritis. Per chart review, she abuses marijuana and had similar symptoms when she was admitted to the hospital in March 2018. Her last use of marijuana was the day PTA (5/13). She continues to experience abdominal pain and N/V with last episode of emesis after attempting to consume clear liquids for lunch today which exacerbated abdominal pain. Per Dr. Paulina Fusi note today, with with UTI and KUB showed no evidence of bowel obstruction or ileus. Pt with kidney stones seen on CT scan.   Physical assessment shows no muscle and no fat wasting at this time. Per review, she has lost 3 lbs (2.3% body weight) in the past 2 months which is not significant for time frame. Continue to encourage PO intakes as tolerated.  Medications reviewed; 10 mg rectal Dulcolax x1 dose today, 2 g IV Mg sulfate 1 run yesterday, 5  mg IV Reglan TID, 40 mg IV Protonix BID, 10 mEq IV KCl x4 runs yesterday, 1 tablet Senokot BID.  Labs reviewed; Ca: 8.1 mg/dL.  IVF: NS-20 mEq KCl @ 125 mL/hr.    Diet Order:  Diet NPO time specified  Skin:  Reviewed, no issues  Last BM:  PTA/unknown  Height:   Ht Readings from Last 1 Encounters:  04/02/17 5' 2"  (1.575 m)    Weight:   Wt Readings from Last 1 Encounters:  04/02/17 130 lb 4.7 oz (59.1 kg)    Ideal Body Weight:  50 kg  BMI:  Body mass index is 23.83 kg/m.  Estimated Nutritional Needs:   Kcal:  1475-1655 (25-28 kcal/kg)  Protein:  60-70 grams  Fluid:  >/= 2 L/day  EDUCATION NEEDS:   No education needs identified at this time    Jarome Matin, MS, RD, LDN, CNSC Inpatient Clinical Dietitian Pager # [phone] After hours/weekend pager # [phone]

## 2017-04-03 NOTE — Progress Notes (Signed)
Patient attempted to eat clear liquid lunch.  Patient unable to tolerate and started to vomit and complain of severe abdominal pain.  PRN meds provided. Will continue to monitor. Jeanne Mcneil A

## 2017-04-04 ENCOUNTER — Inpatient Hospital Stay (HOSPITAL_COMMUNITY): Payer: Self-pay

## 2017-04-04 DIAGNOSIS — N179 Acute kidney failure, unspecified: Secondary | ICD-10-CM

## 2017-04-04 DIAGNOSIS — E860 Dehydration: Secondary | ICD-10-CM

## 2017-04-04 DIAGNOSIS — K2950 Unspecified chronic gastritis without bleeding: Secondary | ICD-10-CM

## 2017-04-04 LAB — COMPREHENSIVE METABOLIC PANEL
ALT: 44 U/L (ref 14–54)
ANION GAP: 11 (ref 5–15)
AST: 32 U/L (ref 15–41)
Albumin: 3.9 g/dL (ref 3.5–5.0)
Alkaline Phosphatase: 62 U/L (ref 38–126)
BUN: 15 mg/dL (ref 6–20)
CALCIUM: 9.2 mg/dL (ref 8.9–10.3)
CO2: 21 mmol/L — ABNORMAL LOW (ref 22–32)
Chloride: 108 mmol/L (ref 101–111)
Creatinine, Ser: 0.85 mg/dL (ref 0.44–1.00)
Glucose, Bld: 98 mg/dL (ref 65–99)
POTASSIUM: 3.6 mmol/L (ref 3.5–5.1)
Sodium: 140 mmol/L (ref 135–145)
TOTAL PROTEIN: 6.9 g/dL (ref 6.5–8.1)
Total Bilirubin: 0.8 mg/dL (ref 0.3–1.2)

## 2017-04-04 LAB — CBC
HEMATOCRIT: 37 % (ref 36.0–46.0)
Hemoglobin: 12.3 g/dL (ref 12.0–15.0)
MCH: 29 pg (ref 26.0–34.0)
MCHC: 33.2 g/dL (ref 30.0–36.0)
MCV: 87.3 fL (ref 78.0–100.0)
Platelets: 363 10*3/uL (ref 150–400)
RBC: 4.24 MIL/uL (ref 3.87–5.11)
RDW: 14.8 % (ref 11.5–15.5)
WBC: 13.7 10*3/uL — ABNORMAL HIGH (ref 4.0–10.5)

## 2017-04-04 LAB — HEMOGLOBIN A1C
HEMOGLOBIN A1C: 5.3 % (ref 4.8–5.6)
MEAN PLASMA GLUCOSE: 105 mg/dL

## 2017-04-04 MED ORDER — TECHNETIUM TC 99M MEBROFENIN IV KIT
4.9500 | PACK | Freq: Once | INTRAVENOUS | Status: AC | PRN
  Administered 2017-04-04: 4.95 via INTRAVENOUS

## 2017-04-04 MED ORDER — METRONIDAZOLE IN NACL 5-0.79 MG/ML-% IV SOLN
500.0000 mg | Freq: Three times a day (TID) | INTRAVENOUS | Status: DC
  Administered 2017-04-04: 500 mg via INTRAVENOUS
  Filled 2017-04-04 (×2): qty 100

## 2017-04-04 NOTE — Progress Notes (Signed)
    CC:  Chronic and acute abdominal pain  Subjective: She is back in the shower so I cannot examine her right now.  Awaiting HIDA and I have ordered labs for today also. The nurse got her out of the shower she is crying, and fairly hysterical outside of the shower.   Objective: Vital signs in last 24 hours: Temp:  [97.8 F (36.6 C)-98.8 F (37.1 C)] 98.4 F (36.9 C) (05/16 0618) Pulse Rate:  [51-56] 51 (05/16 0618) Resp:  [14-20] 16 (05/16 0618) BP: (120-140)/(68-85) 120/74 (05/16 0618) SpO2:  [93 %-100 %] 93 % (05/16 0618) Last BM Date: 04/03/17 300 Po 3125 IV Urine? Emesis x 1 Stool x 1 Afebrile, VSS Labs pending  Normal yesterday  HIDA pending Intake/Output from previous day: 05/15 0701 - 05/16 0700 In: 3425 [P.O.:300; I.V.:3125] Out: 3 [Urine:1; Emesis/NG output:1; Stool:1] Intake/Output this shift: No intake/output data recorded.  General appearance: alert, cooperative and no distress Resp: clear to auscultation bilaterally GI: Soft, she points to her right upper quadrant as the source of pain. She is tender all over. Positive bowel sounds, but hypoactive.  Lab Results:   Recent Labs  04/02/17 0543 04/03/17 0611  WBC 12.5* 8.4  HGB 10.8* 10.2*  HCT 33.4* 30.7*  PLT 270 248    BMET  Recent Labs  04/02/17 1918 04/03/17 0611  NA 136 138  K 3.4* 4.0  CL 104 110  CO2 26 24  GLUCOSE 100* 85  BUN 15 13  CREATININE 0.66 0.74  CALCIUM 8.1* 8.1*   PT/INR No results for input(s): LABPROT, INR in the last 72 hours.   Recent Labs Lab 03/31/17 1200 04/02/17 0113 04/02/17 0543 04/03/17 0611  AST 29 33 23 30  ALT 16 21 19 28   ALKPHOS 55 66 48 41  BILITOT 0.5 0.8 0.7 0.4  PROT 7.2 8.2* 6.6 5.4*  ALBUMIN 4.2 5.1* 3.9 3.0*     Lipase     Component Value Date/Time   LIPASE 16 04/02/2017 0113     Medications: . bisacodyl  10 mg Rectal Once  . metoCLOPramide (REGLAN) injection  5 mg Intravenous Q8H  . pantoprazole (PROTONIX) IV  40 mg  Intravenous Q12H  . senna-docusate  1 tablet Oral BID   . 0.9 % NaCl with KCl 20 mEq / L 125 mL/hr at 04/04/17 0603  . cefTRIAXone (ROCEPHIN)  IV Stopped (04/04/17 4462)  . metronidazole     Anti-infectives    Start     Dose/Rate Route Frequency Ordered Stop   04/04/17 1400  metroNIDAZOLE (FLAGYL) IVPB 500 mg     500 mg 100 mL/hr over 60 Minutes Intravenous Every 8 hours 04/04/17 1121     04/02/17 0500  cefTRIAXone (ROCEPHIN) 1 g in dextrose 5 % 50 mL IVPB     1 g 100 mL/hr over 30 Minutes Intravenous Daily 04/02/17 0416        Assessment/Plan Chronic abdominal pain Gallbladder wall thickening/pericholecystic fluid/no gallstones Nephrolithiasis GERD Chronic gastritis Uterine fibroids Anxiety/PTSD Ongoing tobacco use FEN:  IV fluids/NPO ID:  Rocephin 5/13 =>> day 4 flagyl 5/16 =>>day 1 DVT:  SCD's only  Plan: Await HIDA and labs will examine her after that.  LOS: 1 day    Jahlon Baines 04/04/2017 [phone]

## 2017-04-04 NOTE — Progress Notes (Signed)
Called to room by pt.States she has a "situation at home" and must leave immediately.Explored options to help w/ situation but pt states no other option but to leave "right now".Explained the dangers of leaving AMA such as worsening symptoms and even death.Adamant she must leave and I paged MD Dr Erlinda Hong to explain risks of leaving and encourage pt to stay.Dr Erlinda Hong talked at length w/ pt as above.

## 2017-04-04 NOTE — Discharge Summary (Addendum)
Discharge Summary  Jeanne Mcneil QQV:[id number] DOB: 14-Nov-1986  PCP: Nolene Ebbs, MD  Admit date: 04/02/2017 Discharge date: 04/08/2017  Patient sign out AMA, more than 50% time spent on coordination of care and patient 's eudcation  Recommendations for Outpatient Follow-up:  1. F/u with PMD within a week  for hospital discharge follow up, repeat cbc/bmp at follow up  Discharge Diagnoses:  Active Hospital Problems   Diagnosis Date Noted  . Nausea & vomiting 04/02/2017  . Nausea and vomiting 01/15/2017  . Hypokalemia 10/11/2016  . Hyperglycemia 10/11/2016  . UTI (urinary tract infection) 07/04/2016    Resolved Hospital Problems   Diagnosis Date Noted Date Resolved  No resolved problems to display.    Discharge Condition: stable  Diet recommendation: heart healthy/carb modified  Filed Weights   04/02/17 0020 04/02/17 0325  Weight: 60.3 kg (133 lb) 59.1 kg (130 lb 4.7 oz)    History of present illness:  PCP: Nolene Ebbs, MD  Patient coming from: Home.  Chief Complaint: Nausea vomiting.  HPI: Jeanne Mcneil is a 31 y.o. female with recently admitted for nausea vomiting and patient left AMA 2 days ago presents back to the ER with persistent nausea vomiting. During last admission patient had HIDA scan which showed features concerning for biliary dyskinesia and surgery was planning to have discussion with patient about possible cholecystectomy. MRA of the abdomen done showed left renal cyst.   ED Course: On exam patient's abdomen appears benign. Patient denies any diarrhea. Has been having multiple episodes of nausea and vomiting. Tox screen is positive for opiates benzo and marijuana. Patient is being admitted for further observation.   Hospital Course:  Active Problems:   UTI (urinary tract infection)   Hypokalemia   Hyperglycemia   Nausea and vomiting   Nausea & vomiting   1-UTI, prior history of kidney stone, UA with too numerous to count WBC, RBC .  URINE CULTURE with multiple species  -KUB with; No evidence of bowel obstruction or ileus. Calculus is seen left of lumbar spine corresponding to ureteropelvic junction calculus noted on prior CT scan.  -Renal US; complex cyst,  no hydronephrosis.  "Somewhat complex appearing mass in the medial upper pole left kidney measuring 1.3 x 1.6 x 1.3 cm. Further evaluation with pre and post contrast MRI or CT should be considered. MRI is preferred in younger patients (due to lack of ionizing radiation) and for evaluating calcified lesion(s)." -MRI ab" 1. Benign Bosniak 1 renal cysts the upper pole of the LEFT kidney corresponds to the indeterminate lesion on comparison ultrasound. 2. Small amount pericholecystic fluid. No gallstones evident.   Continue with IV fluids. IV ceftriaxone. Toradol, Morphine for pain.  Urology consulted, will follow recommendations   2-Nausea, vomiting. Prior history of cyclical vomiting.  She does has tendency to have long and hot shower, she report she get symptom relieve from hot shower In setting marijuana use and also UTI. /upj stone? Gallbladder ? Kub/abdominal mri as above HIDA pending  Surgery consulted.  IV fluids, antiemetics.   3-Hypokalemia; resolved.   4-Hyperglycemia; CBG 224 on admission.  follow Hb -A1c pending.   5-AKI; improved with fluids.    Procedures:  HIDA scan  Consultations:  Urology  General surgery  Discharge Exam: BP 122/85 (BP Location: Left Arm)   Pulse 67   Temp 98.7 F (37.1 C) (Oral)   Resp 16   Ht 5' 2"  (1.575 m)   Wt 59.1 kg (130 lb 4.7 oz)   LMP  03/31/2017 (Within Days)   SpO2 95%   BMI 23.83 kg/m   General: anxious, agitated, want to get into the shower during conversation Cardiovascular: RRR Respiratory: CTABL  Discharge Instructions You were cared for by a hospitalist during your hospital stay. If you have any questions about your discharge medications or the care you received while you were in the  hospital after you are discharged, you can call the unit and asked to speak with the hospitalist on call if the hospitalist that took care of you is not available. Once you are discharged, your primary care physician will handle any further medical issues. Please note that NO REFILLS for any discharge medications will be authorized once you are discharged, as it is imperative that you return to your primary care physician (or establish a relationship with a primary care physician if you do not have one) for your aftercare needs so that they can reassess your need for medications and monitor your lab values.   Allergies as of 04/04/2017      Reactions   Keflex [cephalexin] Other (See Comments)   Reaction:  Bladder spasms   Codeine Hives, Itching   Tape Other (See Comments)   Reaction:  Burning    Vicodin [hydrocodone-acetaminophen] Hives, Itching      Medication List    ASK your doctor about these medications   pantoprazole 40 MG tablet Commonly known as:  PROTONIX Take 1 tablet (40 mg total) by mouth 2 (two) times daily.      Allergies  Allergen Reactions  . Keflex [Cephalexin] Other (See Comments)    Reaction:  Bladder spasms  . Codeine Hives and Itching  . Tape Other (See Comments)    Reaction:  Burning   . Vicodin [Hydrocodone-Acetaminophen] Hives and Itching      The results of significant diagnostics from this hospitalization (including imaging, microbiology, ancillary and laboratory) are listed below for reference.    Significant Diagnostic Studies: Mr Abdomen W Wo Contrast  Result Date: 04/02/2017 CLINICAL DATA:  Abdominal pain, body aches. EXAM: MRI ABDOMEN WITHOUT AND WITH CONTRAST TECHNIQUE: Multiplanar multisequence MR imaging of the abdomen was performed both before and after the administration of intravenous contrast. CONTRAST:  35m MULTIHANCE GADOBENATE DIMEGLUMINE 529 MG/ML IV SOLN COMPARISON:  None. FINDINGS: Lower chest:  Lung bases are clear. Hepatobiliary: No  focal hepatic lesion. No no duct dilatation. Small amount pericholecystic fluid. No gallstones evident. Pancreas: Normal pancreatic parenchymal intensity. No ductal dilatation or inflammation. Spleen: Normal spleen. Adrenals/urinary tract: Adrenal glands normal. Round nonenhancing lesion in the upper pole of the LEFT kidney measures 11 mm (image 13, series 903). This lesion is hyperintense on T2 weighted imaging and corresponds to the indeterminate lesion on comparison ultrasound. No additional renal lesions. Stomach/Bowel: Stomach and limited of the small bowel is unremarkable Vascular/Lymphatic: Abdominal aortic normal caliber. No retroperitoneal periportal lymphadenopathy. Musculoskeletal: No aggressive osseous lesion IMPRESSION: 1. Benign Bosniak 1 renal cysts the upper pole of the LEFT kidney corresponds to the indeterminate lesion on comparison ultrasound. 2. Small amount pericholecystic fluid.  No gallstones evident. Electronically Signed   By: SSuzy BouchardM.D.   On: 04/02/2017 16:27   UKoreaRenal  Result Date: 04/02/2017 CLINICAL DATA:  Nephrolithiasis EXAM: RENAL / URINARY TRACT ULTRASOUND COMPLETE COMPARISON:  CT abdomen and pelvis January 15, 2017 FINDINGS: Right Kidney: Length: 12.6 cm. Echogenicity and renal cortical thickness are within normal limits. No mass, perinephric fluid, or hydronephrosis visualized. No sonographically demonstrable calculus or ureterectasis. Left Kidney: Length: 11.6  cm. Echogenicity and renal cortical thickness are within normal limits. No perinephric fluid or hydronephrosis visualized. There is a complex mass in the medial upper pole left kidney which contains multiple thickened septations measuring 1.3 x 1.6 x 1.3 cm. No sonographically demonstrable calculus or ureterectasis. Bladder: Appears normal for degree of bladder distention. IMPRESSION: Somewhat complex appearing mass in the medial upper pole left kidney measuring 1.3 x 1.6 x 1.3 cm. Further evaluation with  pre and post contrast MRI or CT should be considered. MRI is preferred in younger patients (due to lack of ionizing radiation) and for evaluating calcified lesion(s). No calculi evident.  Study otherwise unremarkable. Electronically Signed   By: Lowella Grip III M.D.   On: 04/02/2017 10:07   Nm Hepato W/eject Fract  Result Date: 04/04/2017 CLINICAL DATA:  Nausea. EXAM: NUCLEAR MEDICINE HEPATOBILIARY IMAGING WITH GALLBLADDER EF TECHNIQUE: Sequential images of the abdomen were obtained out to 60 minutes following intravenous administration of radiopharmaceutical. After oral ingestion of Ensure, gallbladder ejection fraction was determined. At 60 min, normal ejection fraction is greater than 33%. RADIOPHARMACEUTICALS:  4.95 mCi Tc-79m Choletec IV COMPARISON:  04/03/2017 FINDINGS: Prompt uptake and biliary excretion of activity by the liver is seen. Gallbladder activity is visualized, consistent with patency of cystic duct. Biliary activity passes into small bowel, consistent with patent common bile duct. Calculated gallbladder ejection fraction is 21%. (Normal gallbladder ejection fraction with Ensure is greater than 33%.) IMPRESSION: 1. Patent cystic duct without evidence for acute cholecystitis. 2. Abnormally low gallbladder ejection fraction at 21 % which reflect biliary dyskinesis. Electronically Signed   By: TKerby MoorsM.D.   On: 04/04/2017 15:56   Dg Abd Acute W/chest  Result Date: 04/02/2017 CLINICAL DATA:  Upper abdominal pain. EXAM: DG ABDOMEN ACUTE W/ 1V CHEST COMPARISON:  CT scan of January 15, 2017. FINDINGS: There is no evidence of dilated bowel loops or free intraperitoneal air. Phleboliths are noted in the pelvis. Calculus is noted to the left of the L2-3 disc space which corresponds to ureteropelvic junction calculus seen on prior CT scan. Phleboliths are noted in the pelvis. Heart size and mediastinal contours are within normal limits. Both lungs are clear. IMPRESSION: No evidence of  bowel obstruction or ileus. Calculus is seen left of lumbar spine corresponding to ureteropelvic junction calculus noted on prior CT scan. No acute cardiopulmonary disease. Electronically Signed   By: JMarijo Conception M.D.   On: 04/02/2017 08:04   Ct Renal Stone Study  Result Date: 04/06/2017 CLINICAL DATA:  Pain. EXAM: CT ABDOMEN AND PELVIS WITHOUT CONTRAST TECHNIQUE: Multidetector CT imaging of the abdomen and pelvis was performed following the standard protocol without IV contrast. COMPARISON:  01/15/2017 and 10/11/2016 FINDINGS: Lower chest: Normal. Hepatobiliary: Focal fatty infiltration adjacent to the falciform ligament, unchanged. Liver parenchyma is otherwise normal. Normal biliary tree. Pancreas: Unremarkable. No pancreatic ductal dilatation or surrounding inflammatory changes. Spleen: Normal in size without focal abnormality. Adrenals/Urinary Tract: Nonobstructing 6 mm stone in the left renal pelvis. Tiny stones in the upper and lower poles of the left kidney. Previously demonstrated cyst in the upper pole of the left kidney is quite subtle on this exam. No ureteral or bladder calculi. Adrenal glands are normal. Stomach/Bowel: Stomach is within normal limits. Appendix appears normal. No evidence of bowel wall thickening, distention, or inflammatory changes. Vascular/Lymphatic: No significant vascular findings are present. No enlarged abdominal or pelvic lymph nodes. Reproductive: Uterus and ovaries are normal except for slight exophytic fibroid on the left side  of the uterine fundus, 2.3 cm. Cyst seen on the right ovary on the prior exam has resolved. Other: No abdominal wall hernia or abnormality. No abdominopelvic ascites. Musculoskeletal: No acute or significant osseous findings. IMPRESSION: Mobile 6 mm stone in the left renal pelvis. No obstructive findings on the current exam. Tiny stones in the upper and lower poles of the left kidney. Small fibroid in the uterine fundus. Otherwise, benign  appearing abdomen. Electronically Signed   By: Lorriane Shire M.D.   On: 04/06/2017 11:31   US Abdomen Limited Ruq  Result Date: 04/03/2017 CLINICAL DATA:  Right upper quadrant pain over the last week. EXAM: US ABDOMEN LIMITED - RIGHT UPPER QUADRANT COMPARISON:  MRI 04/02/2017.  Ultrasound 04/02/2017.  CT 01/15/2017. FINDINGS: Gallbladder: Mild gallbladder wall thickening. No shadowing stones. No Murphy sign. Tiny amount of fluid surrounding the gallbladder. Common bile duct: Diameter: 3 mm, normal Liver: No focal lesion identified. Within normal limits in parenchymal echogenicity. IMPRESSION: Abnormal study showing mild gallbladder wall thickening and fluid around the gallbladder. No evidence of stone or positive Murphy sign. No ductal dilatation. This examination is indeterminate. Consider nuclear medicine correlation. Electronically Signed   By: Nelson Chimes M.D.   On: 04/03/2017 10:47    Microbiology: Recent Results (from the past 240 hour(s))  Culture, Urine     Status: Abnormal   Collection Time: 04/02/17  4:15 AM  Result Value Ref Range Status   Specimen Description URINE, CLEAN CATCH  Final   Special Requests NONE  Final   Culture MULTIPLE SPECIES PRESENT, SUGGEST RECOLLECTION (A)  Final   Report Status 04/03/2017 FINAL  Final     Labs: Basic Metabolic Panel:  Recent Labs Lab 04/02/17 0543  04/02/17 1918 04/03/17 0611 04/04/17 1329 04/06/17 0208 04/07/17 0622  NA 141  < > 136 138 140 141 137  K 2.9*  < > 3.4* 4.0 3.6 3.5 3.2*  CL 101  < > 104 110 108 104 103  CO2 26  < > 26 24 21* 25 27  GLUCOSE 158*  < > 100* 85 98 117* 83  BUN 21*  < > 15 13 15 13 10   CREATININE 0.95  < > 0.66 0.74 0.85 0.98 0.87  CALCIUM 8.3*  < > 8.1* 8.1* 9.2 8.7* 8.1*  MG 1.9  --   --   --   --   --   --   < > = values in this interval not displayed. Liver Function Tests:  Recent Labs Lab 04/02/17 0113 04/02/17 0543 04/03/17 0611 04/04/17 1329 04/06/17 0208  AST 33 23 30 32 19  ALT 21 19  28  44 31  ALKPHOS 66 48 41 62 55  BILITOT 0.8 0.7 0.4 0.8 0.5  PROT 8.2* 6.6 5.4* 6.9 6.4*  ALBUMIN 5.1* 3.9 3.0* 3.9 3.8    Recent Labs Lab 04/02/17 0113 04/06/17 0208  LIPASE 16 18   No results for input(s): AMMONIA in the last 168 hours. CBC:  Recent Labs Lab 04/02/17 0113 04/02/17 0543 04/03/17 0611 04/04/17 1329 04/06/17 0208 04/07/17 0622  WBC 19.3* 12.5* 8.4 13.7* 11.2* 6.5  NEUTROABS 16.1* 10.8*  --   --  8.7*  --   HGB 12.8 10.8* 10.2* 12.3 12.0 10.3*  HCT 37.7 33.4* 30.7* 37.0 36.1 31.0*  MCV 87.3 88.1 89.2 87.3 87.6 88.6  PLT 397 270 248 363 339 256   Cardiac Enzymes: No results for input(s): CKTOTAL, CKMB, CKMBINDEX, TROPONINI in the last 168 hours. BNP: BNP (  last 3 results) No results for input(s): BNP in the last 8760 hours.  ProBNP (last 3 results) No results for input(s): PROBNP in the last 8760 hours.  CBG:  Recent Labs Lab 04/03/17 1718 04/03/17 2128 04/06/17 1709 04/07/17 0021 04/07/17 0521  GLUCAP 94 77 77 98 85       Signed:  Kaylise Blakeley MD, PhD  [facility] 04/08/2017, 9:40 PM

## 2017-04-04 NOTE — Progress Notes (Signed)
HIDA scan: Patent cystic duct with no evidence of acute cholecystitis ejection fraction is 21%. Normal being greater than 33%.

## 2017-04-04 NOTE — Progress Notes (Signed)
Pt has been in shower most of time after nuc med.Refuses to wear tele.Advised on reasons for tele and need to ask for help w/ OOB.

## 2017-04-04 NOTE — Progress Notes (Signed)
To Nuc med

## 2017-04-04 NOTE — Progress Notes (Signed)
PROGRESS NOTE    Jeanne Mcneil  WCH:[id number] DOB: [birth year] DOA: 04/02/2017 PCP: Nolene Ebbs, MD    Brief Narrative: Jeanne Mcneil is a 31 y.o. female with history of marijuana abuse who was admitted on March 2018 for nausea vomiting with renal failure in the setting of marijuana abuse presents to the ER with complaints of nausea vomiting of the last couple of days. Patient also has been having crampy abdominal discomfort. Denies any diarrhea. Denies any sick contacts or recent travel. Admits to have taken marijuana yesterday. Denies any fever or chills.   ED Course: Patient come to the ER 24 hours ago and was given IV fluids and antiemetics and discharged home. Despite this patient was having vomiting and presents to the ER again. Labs reveal hypokalemia with leukocytosis and UA showing possible UTI. Patient was given 2 L fluid bolus, Reglan and Benadryl in the ER and admitted for further observation. On my exam patient's abdomen appears benign. Patient had some cramps in her lower extremities.   Assessment & Plan:   Active Problems:   UTI (urinary tract infection)   Hypokalemia   Hyperglycemia   Nausea and vomiting   Nausea & vomiting   1-UTI, prior history of kidney stone, UA with too numerous to count WBC, RBC . URINE CULTURE with multiple species  -KUB with; No evidence of bowel obstruction or ileus. Calculus is seen left of lumbar spine corresponding to ureteropelvic junction calculus noted on prior CT scan.  -Renal US; complex cyst,  no hydronephrosis.  "Somewhat complex appearing mass in the medial upper pole left kidney measuring 1.3 x 1.6 x 1.3 cm. Further evaluation with pre and post contrast MRI or CT should be considered. MRI is preferred in younger patients (due to lack of ionizing radiation) and for evaluating calcified lesion(s)." -MRI ab" 1. Benign Bosniak 1 renal cysts the upper pole of the LEFT kidney corresponds to the indeterminate lesion on comparison  ultrasound. 2. Small amount pericholecystic fluid.  No gallstones evident.   Continue with IV fluids. IV ceftriaxone. Toradol, Morphine for pain.  Urology consulted, will follow recommendations   2-Nausea, vomiting. Prior history of cyclical vomiting.  She does has tendency to have long and hot shower, she report she get symptom relieve from hot shower In setting marijuana use and also UTI. /upj stone? Gallbladder ? Kub/abdominal mri as above HIDA pending  Surgery consulted.  IV fluids, antiemetics.   3-Hypokalemia; resolved.   4-Hyperglycemia; CBG 224 on admission.  follow Hb -A1c pending.   5-AKI; improved with fluids.      DVT prophylaxis: SCD.  Code Status: full code.  Family Communication: care discussed with patient  Disposition Plan: home at time of discharge   Consultants:   General surgery  urology   Procedures:   Renal US.   HIDA scan    Antimicrobials: ceftriaxone    Subjective: She was feeling better this morning. Pain improved.  Report back pain, band like. Also relates abdominal pain right side.   This afternoon patient started to vomit again after trying clear diet.   Objective: Vitals:   04/03/17 1300 04/03/17 2100 04/04/17 0618 04/04/17 1453  BP: 140/68 133/85 120/74 122/85  Pulse: (!) 56 (!) 54 (!) 51 67  Resp: 20 14 16 16   Temp: 97.8 F (36.6 C) 98.8 F (37.1 C) 98.4 F (36.9 C) 98.7 F (37.1 C)  TempSrc: Oral Oral Oral Oral  SpO2: 96% 100% 93% 95%  Weight:  Height:        Intake/Output Summary (Last 24 hours) at 04/04/17 1611 Last data filed at 04/04/17 1500  Gross per 24 hour  Intake          3260.41 ml  Output                1 ml  Net          3259.41 ml   Filed Weights   04/02/17 0020 04/02/17 0325  Weight: 60.3 kg (133 lb) 59.1 kg (130 lb 4.7 oz)    Examination:  General exam: NAD Respiratory system: CTA Cardiovascular system: S 1, S 2 RRR Gastrointestinal system: Soft, mild RUQ tenderness.  Central  nervous system: non focal  Extremities: no edema Skin: No rashes, lesions or ulcers     Data Reviewed: I have personally reviewed following labs and imaging studies  CBC:  Recent Labs Lab 03/31/17 1200 04/02/17 0113 04/02/17 0543 04/03/17 0611 04/04/17 1329  WBC 12.8* 19.3* 12.5* 8.4 13.7*  NEUTROABS  --  16.1* 10.8*  --   --   HGB 12.7 12.8 10.8* 10.2* 12.3  HCT 38.6 37.7 33.4* 30.7* 37.0  MCV 88.7 87.3 88.1 89.2 87.3  PLT 326 397 270 248 027   Basic Metabolic Panel:  Recent Labs Lab 04/02/17 0543 04/02/17 1315 04/02/17 1918 04/03/17 0611 04/04/17 1329  NA 141 138 136 138 140  K 2.9* 2.8* 3.4* 4.0 3.6  CL 101 102 104 110 108  CO2 26 26 26 24  21*  GLUCOSE 158* 117* 100* 85 98  BUN 21* 20 15 13 15   CREATININE 0.95 0.93 0.66 0.74 0.85  CALCIUM 8.3* 8.2* 8.1* 8.1* 9.2  MG 1.9  --   --   --   --    GFR: Estimated Creatinine Clearance: 74.5 mL/min (by C-G formula based on SCr of 0.85 mg/dL). Liver Function Tests:  Recent Labs Lab 03/31/17 1200 04/02/17 0113 04/02/17 0543 04/03/17 0611 04/04/17 1329  AST 29 33 23 30 32  ALT 16 21 19 28  44  ALKPHOS 55 66 48 41 62  BILITOT 0.5 0.8 0.7 0.4 0.8  PROT 7.2 8.2* 6.6 5.4* 6.9  ALBUMIN 4.2 5.1* 3.9 3.0* 3.9    Recent Labs Lab 03/31/17 1200 04/02/17 0113  LIPASE 32 16   No results for input(s): AMMONIA in the last 168 hours. Coagulation Profile: No results for input(s): INR, PROTIME in the last 168 hours. Cardiac Enzymes: No results for input(s): CKTOTAL, CKMB, CKMBINDEX, TROPONINI in the last 168 hours. BNP (last 3 results) No results for input(s): PROBNP in the last 8760 hours. HbA1C:  Recent Labs  04/02/17 0543  HGBA1C 5.3   CBG:  Recent Labs Lab 04/02/17 2154 04/03/17 0735 04/03/17 1135 04/03/17 1718 04/03/17 2128  GLUCAP 95 93 87 94 77   Lipid Profile: No results for input(s): CHOL, HDL, LDLCALC, TRIG, CHOLHDL, LDLDIRECT in the last 72 hours. Thyroid Function Tests: No results for  input(s): TSH, T4TOTAL, FREET4, T3FREE, THYROIDAB in the last 72 hours. Anemia Panel: No results for input(s): VITAMINB12, FOLATE, FERRITIN, TIBC, IRON, RETICCTPCT in the last 72 hours. Sepsis Labs: No results for input(s): PROCALCITON, LATICACIDVEN in the last 168 hours.  Recent Results (from the past 240 hour(s))  Culture, Urine     Status: Abnormal   Collection Time: 04/02/17  4:15 AM  Result Value Ref Range Status   Specimen Description URINE, CLEAN CATCH  Final   Special Requests NONE  Final   Culture MULTIPLE  SPECIES PRESENT, SUGGEST RECOLLECTION (A)  Final   Report Status 04/03/2017 FINAL  Final         Radiology Studies: Nm Hepato W/eject Fract  Result Date: 04/04/2017 CLINICAL DATA:  Nausea. EXAM: NUCLEAR MEDICINE HEPATOBILIARY IMAGING WITH GALLBLADDER EF TECHNIQUE: Sequential images of the abdomen were obtained out to 60 minutes following intravenous administration of radiopharmaceutical. After oral ingestion of Ensure, gallbladder ejection fraction was determined. At 60 min, normal ejection fraction is greater than 33%. RADIOPHARMACEUTICALS:  4.95 mCi Tc-56m Choletec IV COMPARISON:  04/03/2017 FINDINGS: Prompt uptake and biliary excretion of activity by the liver is seen. Gallbladder activity is visualized, consistent with patency of cystic duct. Biliary activity passes into small bowel, consistent with patent common bile duct. Calculated gallbladder ejection fraction is 21%. (Normal gallbladder ejection fraction with Ensure is greater than 33%.) IMPRESSION: 1. Patent cystic duct without evidence for acute cholecystitis. 2. Abnormally low gallbladder ejection fraction at 21 % which reflect biliary dyskinesis. Electronically Signed   By: TKerby MoorsM.D.   On: 04/04/2017 15:56   [place]Abdomen Limited Ruq  Result Date: 04/03/2017 CLINICAL DATA:  Right upper quadrant pain over the last week. EXAM: [place]DOMEN LIMITED - RIGHT UPPER QUADRANT COMPARISON:  MRI 04/02/2017.  Ultrasound  04/02/2017.  CT 01/15/2017. FINDINGS: Gallbladder: Mild gallbladder wall thickening. No shadowing stones. No Murphy sign. Tiny amount of fluid surrounding the gallbladder. Common bile duct: Diameter: 3 mm, normal Liver: No focal lesion identified. Within normal limits in parenchymal echogenicity. IMPRESSION: Abnormal study showing mild gallbladder wall thickening and fluid around the gallbladder. No evidence of stone or positive Murphy sign. No ductal dilatation. This examination is indeterminate. Consider nuclear medicine correlation. Electronically Signed   By: MNelson ChimesM.D.   On: 04/03/2017 10:47        Scheduled Meds: . bisacodyl  10 mg Rectal Once  . metoCLOPramide (REGLAN) injection  5 mg Intravenous Q8H  . pantoprazole (PROTONIX) IV  40 mg Intravenous Q12H  . senna-docusate  1 tablet Oral BID   Continuous Infusions: . 0.9 % NaCl with KCl 20 mEq / L 125 mL/hr at 04/04/17 0603  . cefTRIAXone (ROCEPHIN)  IV Stopped (04/04/17 05248  . metronidazole Stopped (04/04/17 1458)     LOS: 1 day    Time spent: 25 minutes.     XFlorencia Reasons MD PhD [facility] Pager [phone] If 7PM-7AM, please contact night-coverage [email or web address] Password [email or web address] 04/04/2017, 4:11 PM

## 2017-04-04 NOTE — Progress Notes (Signed)
Pt signed out AMA and left at this time.Communicated to return to ED for further needs w/ verbal agreement.

## 2017-04-05 DIAGNOSIS — F17210 Nicotine dependence, cigarettes, uncomplicated: Secondary | ICD-10-CM | POA: Diagnosis present

## 2017-04-05 DIAGNOSIS — R030 Elevated blood-pressure reading, without diagnosis of hypertension: Secondary | ICD-10-CM | POA: Diagnosis present

## 2017-04-05 DIAGNOSIS — Z960 Presence of urogenital implants: Secondary | ICD-10-CM | POA: Diagnosis present

## 2017-04-05 DIAGNOSIS — K2950 Unspecified chronic gastritis without bleeding: Secondary | ICD-10-CM | POA: Diagnosis present

## 2017-04-05 DIAGNOSIS — N281 Cyst of kidney, acquired: Secondary | ICD-10-CM | POA: Diagnosis present

## 2017-04-05 DIAGNOSIS — F1210 Cannabis abuse, uncomplicated: Secondary | ICD-10-CM | POA: Diagnosis present

## 2017-04-05 DIAGNOSIS — K210 Gastro-esophageal reflux disease with esophagitis: Secondary | ICD-10-CM | POA: Diagnosis present

## 2017-04-05 DIAGNOSIS — N202 Calculus of kidney with calculus of ureter: Secondary | ICD-10-CM | POA: Diagnosis present

## 2017-04-05 DIAGNOSIS — Z7289 Other problems related to lifestyle: Secondary | ICD-10-CM

## 2017-04-05 DIAGNOSIS — Z881 Allergy status to other antibiotic agents status: Secondary | ICD-10-CM

## 2017-04-05 DIAGNOSIS — Z91048 Other nonmedicinal substance allergy status: Secondary | ICD-10-CM

## 2017-04-05 DIAGNOSIS — F4310 Post-traumatic stress disorder, unspecified: Secondary | ICD-10-CM | POA: Diagnosis present

## 2017-04-05 DIAGNOSIS — Z79899 Other long term (current) drug therapy: Secondary | ICD-10-CM

## 2017-04-05 DIAGNOSIS — H409 Unspecified glaucoma: Secondary | ICD-10-CM | POA: Diagnosis present

## 2017-04-05 DIAGNOSIS — G8929 Other chronic pain: Secondary | ICD-10-CM | POA: Diagnosis present

## 2017-04-05 DIAGNOSIS — Z885 Allergy status to narcotic agent status: Secondary | ICD-10-CM

## 2017-04-05 DIAGNOSIS — K3184 Gastroparesis: Principal | ICD-10-CM | POA: Diagnosis present

## 2017-04-05 DIAGNOSIS — R1013 Epigastric pain: Secondary | ICD-10-CM | POA: Diagnosis present

## 2017-04-06 ENCOUNTER — Inpatient Hospital Stay (HOSPITAL_COMMUNITY)
Admission: EM | Admit: 2017-04-06 | Discharge: 2017-04-07 | DRG: 392 | Disposition: A | Discharge status: Home / Self Care | Payer: Self-pay | Attending: Internal Medicine | Admitting: Internal Medicine

## 2017-04-06 ENCOUNTER — Encounter (HOSPITAL_COMMUNITY): Payer: Self-pay | Admitting: Emergency Medicine

## 2017-04-06 ENCOUNTER — Observation Stay (HOSPITAL_COMMUNITY): Payer: Self-pay

## 2017-04-06 DIAGNOSIS — R112 Nausea with vomiting, unspecified: Secondary | ICD-10-CM | POA: Diagnosis present

## 2017-04-06 DIAGNOSIS — R1013 Epigastric pain: Secondary | ICD-10-CM

## 2017-04-06 DIAGNOSIS — G43A0 Cyclical vomiting, not intractable: Secondary | ICD-10-CM

## 2017-04-06 DIAGNOSIS — R52 Pain, unspecified: Secondary | ICD-10-CM

## 2017-04-06 LAB — URINALYSIS, ROUTINE W REFLEX MICROSCOPIC
Bacteria, UA: NONE SEEN
Glucose, UA: NEGATIVE mg/dL
KETONES UR: 20 mg/dL — AB
Leukocytes, UA: NEGATIVE
Nitrite: NEGATIVE
PROTEIN: 30 mg/dL — AB
Specific Gravity, Urine: 1.028 (ref 1.005–1.030)
pH: 5 (ref 5.0–8.0)

## 2017-04-06 LAB — CBC WITH DIFFERENTIAL/PLATELET
BASOS ABS: 0.1 10*3/uL (ref 0.0–0.1)
Basophils Relative: 0 %
Eosinophils Absolute: 0 10*3/uL (ref 0.0–0.7)
Eosinophils Relative: 0 %
HCT: 36.1 % (ref 36.0–46.0)
HEMOGLOBIN: 12 g/dL (ref 12.0–15.0)
LYMPHS ABS: 1.6 10*3/uL (ref 0.7–4.0)
LYMPHS PCT: 14 %
MCH: 29.1 pg (ref 26.0–34.0)
MCHC: 33.2 g/dL (ref 30.0–36.0)
MCV: 87.6 fL (ref 78.0–100.0)
Monocytes Absolute: 0.9 10*3/uL (ref 0.1–1.0)
Monocytes Relative: 8 %
NEUTROS PCT: 78 %
Neutro Abs: 8.7 10*3/uL — ABNORMAL HIGH (ref 1.7–7.7)
Platelets: 339 10*3/uL (ref 150–400)
RBC: 4.12 MIL/uL (ref 3.87–5.11)
RDW: 14.9 % (ref 11.5–15.5)
WBC: 11.2 10*3/uL — ABNORMAL HIGH (ref 4.0–10.5)

## 2017-04-06 LAB — RAPID URINE DRUG SCREEN, HOSP PERFORMED
Amphetamines: NOT DETECTED
Barbiturates: NOT DETECTED
Benzodiazepines: POSITIVE — AB
COCAINE: NOT DETECTED
OPIATES: POSITIVE — AB
Tetrahydrocannabinol: POSITIVE — AB

## 2017-04-06 LAB — COMPREHENSIVE METABOLIC PANEL
ALK PHOS: 55 U/L (ref 38–126)
ALT: 31 U/L (ref 14–54)
AST: 19 U/L (ref 15–41)
Albumin: 3.8 g/dL (ref 3.5–5.0)
Anion gap: 12 (ref 5–15)
BUN: 13 mg/dL (ref 6–20)
CALCIUM: 8.7 mg/dL — AB (ref 8.9–10.3)
CHLORIDE: 104 mmol/L (ref 101–111)
CO2: 25 mmol/L (ref 22–32)
CREATININE: 0.98 mg/dL (ref 0.44–1.00)
Glucose, Bld: 117 mg/dL — ABNORMAL HIGH (ref 65–99)
Potassium: 3.5 mmol/L (ref 3.5–5.1)
SODIUM: 141 mmol/L (ref 135–145)
Total Bilirubin: 0.5 mg/dL (ref 0.3–1.2)
Total Protein: 6.4 g/dL — ABNORMAL LOW (ref 6.5–8.1)

## 2017-04-06 LAB — GLUCOSE, CAPILLARY: GLUCOSE-CAPILLARY: 77 mg/dL (ref 65–99)

## 2017-04-06 LAB — LIPASE, BLOOD: Lipase: 18 U/L (ref 11–51)

## 2017-04-06 LAB — PREGNANCY, URINE: PREG TEST UR: NEGATIVE

## 2017-04-06 MED ORDER — PROMETHAZINE HCL 25 MG/ML IJ SOLN
25.0000 mg | Freq: Once | INTRAMUSCULAR | Status: AC
  Administered 2017-04-06: 25 mg via INTRAVENOUS
  Filled 2017-04-06 (×2): qty 1

## 2017-04-06 MED ORDER — KETOROLAC TROMETHAMINE 30 MG/ML IJ SOLN
15.0000 mg | Freq: Four times a day (QID) | INTRAMUSCULAR | Status: DC | PRN
  Administered 2017-04-06 – 2017-04-07 (×3): 15 mg via INTRAVENOUS
  Filled 2017-04-06 (×3): qty 1

## 2017-04-06 MED ORDER — PROMETHAZINE HCL 25 MG PO TABS: 25.0000 mg | ORAL_TABLET | Freq: Four times a day (QID) | ORAL | 0 refills | Status: DC | PRN

## 2017-04-06 MED ORDER — GI COCKTAIL ~~LOC~~: 30.0000 mL | Freq: Three times a day (TID) | ORAL | 1 refills | Status: DC | PRN

## 2017-04-06 MED ORDER — SODIUM CHLORIDE 0.9 % IV SOLN
INTRAVENOUS | Status: AC
  Administered 2017-04-06: 09:00:00 via INTRAVENOUS

## 2017-04-06 MED ORDER — SODIUM CHLORIDE 0.9 % IV BOLUS (SEPSIS)
1000.0000 mL | Freq: Once | INTRAVENOUS | Status: AC
  Administered 2017-04-06: 1000 mL via INTRAVENOUS

## 2017-04-06 MED ORDER — ONDANSETRON HCL 4 MG PO TABS: 4.0000 mg | ORAL_TABLET | Freq: Four times a day (QID) | ORAL | Status: DC | PRN

## 2017-04-06 MED ORDER — KETOROLAC TROMETHAMINE 10 MG PO TABS: 10.0000 mg | ORAL_TABLET | Freq: Four times a day (QID) | ORAL | 0 refills | Status: DC | PRN

## 2017-04-06 MED ORDER — PANTOPRAZOLE SODIUM 40 MG IV SOLR
40.0000 mg | Freq: Every day | INTRAVENOUS | Status: DC
  Administered 2017-04-06: 40 mg via INTRAVENOUS
  Filled 2017-04-06 (×2): qty 40

## 2017-04-06 MED ORDER — RANITIDINE HCL 150 MG PO TABS: 150.0000 mg | ORAL_TABLET | Freq: Two times a day (BID) | ORAL | 0 refills | Status: DC

## 2017-04-06 MED ORDER — ONDANSETRON HCL 4 MG/2ML IJ SOLN
4.0000 mg | Freq: Four times a day (QID) | INTRAMUSCULAR | Status: DC | PRN
  Administered 2017-04-06 – 2017-04-07 (×3): 4 mg via INTRAVENOUS
  Filled 2017-04-06 (×3): qty 2

## 2017-04-06 MED ORDER — GI COCKTAIL ~~LOC~~
30.0000 mL | Freq: Three times a day (TID) | ORAL | Status: DC | PRN
  Administered 2017-04-06 – 2017-04-07 (×3): 30 mL via ORAL
  Filled 2017-04-06 (×3): qty 30

## 2017-04-06 NOTE — Progress Notes (Signed)
Pt was seen by Dr Renne Crigler who gave pt the report of her scan for kidney stones and advised she could have sprite. 1 can sprite provided - pt has called for a ride but is yelling at the person on the phone to call her attorney & have him call her now  because she is too sick to go home. Pt has slept since being given the toradol, zofran & Gi cocktail.

## 2017-04-06 NOTE — Progress Notes (Signed)
Patient had verbal order to shower from MD. Patient was in shower for quite a while, this RN asked patient if she was okay and if she needed anything. Walked in while patient was in shower and patient was found lying on shower floor. This RN asked patient if she fell while in shower. Patient stated "no, I just want to lie down and have the water hit me". The NT for this patient offered the [facility] for the patient to sit on while in the shower. Patient stated very angrily, "I've been doing this for the past 4 days why can't I be on the floor". Patient agreed to use BSC while in shower. Will continue to monitor.

## 2017-04-06 NOTE — H&P (Signed)
History and Physical    Jeanne Mcneil:[id number] DOB: 07-23-1986 DOA: 04/06/2017  PCP: Nolene Ebbs, MD  Patient coming from: Home.  Chief Complaint: Nausea vomiting.  HPI: Jeanne Mcneil is a 31 y.o. female with recently admitted for nausea vomiting and patient left AMA 2 days ago presents back to the ER with persistent nausea vomiting. During last admission patient had HIDA scan which showed features concerning for biliary dyskinesia and surgery was planning to have discussion with patient about possible cholecystectomy. MRA of the abdomen done showed left renal cyst.   ED Course: On exam patient's abdomen appears benign. Patient denies any diarrhea. Has been having multiple episodes of nausea and vomiting. Tox screen is positive for opiates benzo and marijuana. Patient is being admitted for further observation.  Review of Systems: As per HPI, rest all negative.   Past Medical History:  Diagnosis Date  . Anxiety   . Back pain   . Constipation   . Depression   . GERD (gastroesophageal reflux disease)   . Glaucoma of both eyes   . Hematuria   . History of abnormal cervical Pap smear   . History of chronic gastritis   . History of kidney stones   . Hydronephrosis, right   . Renal calculi    bilateral per ct [id number]  . Right ureteral stone   . Urgency of urination   . Uterine fibroid   . Uterine polyp     Past Surgical History:  Procedure Laterality Date  . CYSTOSCOPY W/ URETERAL STENT PLACEMENT Right 07/04/2016   Procedure: CYSTOSCOPY WITH RIGHT RETROGRADE PYELOGRAM, RIGHT URETERAL STENT PLACEMENT;  Surgeon: Ardis Hughs, MD;  Location: [facility];  Service: Urology;  Laterality: Right;  . CYSTOSCOPY WITH RETROGRADE PYELOGRAM, URETEROSCOPY AND STENT PLACEMENT Left 11/28/2007  . CYSTOSCOPY WITH RETROGRADE PYELOGRAM, URETEROSCOPY AND STENT PLACEMENT Right 08/02/2016   Procedure: CYSTOSCOPY WITH RIGHT  RETROGRADE PYELOGRAM, URETEROSCOPY,  AND STENT EXCHANGE;   Surgeon: Ardis Hughs, MD;  Location: [facility];  Service: Urology;  Laterality: Right;  . ESOPHAGOGASTRODUODENOSCOPY N/A 05/17/2016   Procedure: ESOPHAGOGASTRODUODENOSCOPY (EGD);  Surgeon: Danie Binder, MD;  Location: [facility];  Service: Endoscopy;  Laterality: N/A;  830   . INDUCED ABORTION  30-Jan-2008     reports that she has been smoking Cigarettes.  She has a 10.00 pack-year smoking history. She has never used smokeless tobacco. She reports that she drinks about 2.4 oz of alcohol per week . She reports that she does not use drugs.  Allergies  Allergen Reactions  . Keflex [Cephalexin] Other (See Comments)    Reaction:  Bladder spasms  . Codeine Hives and Itching  . Tape Other (See Comments)    Reaction:  Burning   . Vicodin [Hydrocodone-Acetaminophen] Hives and Itching    Family History  Problem Relation Age of Onset  . Colon cancer Paternal Uncle        Passed away Jan 29, 2015  . Nephrolithiasis Father   . Hypertension Father   . Nephrolithiasis Sister   . Nephrolithiasis Sister   . Nephrolithiasis Sister   . Hypertension Mother     Prior to Admission medications   Medication Sig Start Date End Date Taking? Authorizing Provider  pantoprazole (PROTONIX) 40 MG tablet Take 1 tablet (40 mg total) by mouth 2 (two) times daily. 01/18/17  Yes Nita Sells, MD  promethazine (PHENERGAN) 25 MG tablet Take 1 tablet (25 mg total) by mouth every 6 (six) hours as needed  for nausea. 10/18/16  Yes Varney Biles, MD  ranitidine (ZANTAC) 150 MG tablet Take 150 mg by mouth 2 (two) times daily.   Yes [provider]    Physical Exam: Vitals:   04/05/17 2353 04/06/17 0231 04/06/17 0522  BP: (!) 171/108 135/68 122/88  Pulse: 82 67 69  Resp: 20 16 16   Temp: 98.3 F (36.8 C)  98.9 F (37.2 C)  TempSrc: Oral  Oral  SpO2: 99% 100% 96%      Constitutional: Moderately built and nourished. Vitals:   04/05/17 2353 04/06/17 0231 04/06/17 0522  BP: (!)  171/108 135/68 122/88  Pulse: 82 67 69  Resp: 20 16 16   Temp: 98.3 F (36.8 C)  98.9 F (37.2 C)  TempSrc: Oral  Oral  SpO2: 99% 100% 96%   Eyes: Anicteric no pallor. ENMT: No discharge from the ears eyes nose and mouth. Neck: No mass felt. No neck rigidity. Respiratory: No rhonchi or crepitations. Cardiovascular: S1 and S2. No murmurs appreciated. Abdomen: Soft nontender bowel sounds present. Musculoskeletal: No edema. No joint effusion. Skin: No rash. The skin appears warm. Neurologic: Alert awake oriented to time place and person. Moves all extremities. Psychiatric: Appears normal. Normal affect.   Labs on Admission: I have personally reviewed following labs and imaging studies  CBC:  Recent Labs Lab 04/02/17 0113 04/02/17 0543 04/03/17 0611 04/04/17 1329 04/06/17 0208  WBC 19.3* 12.5* 8.4 13.7* 11.2*  NEUTROABS 16.1* 10.8*  --   --  8.7*  HGB 12.8 10.8* 10.2* 12.3 12.0  HCT 37.7 33.4* 30.7* 37.0 36.1  MCV 87.3 88.1 89.2 87.3 87.6  PLT 397 270 248 363 151   Basic Metabolic Panel:  Recent Labs Lab 04/02/17 0543 04/02/17 1315 04/02/17 1918 04/03/17 0611 04/04/17 1329 04/06/17 0208  NA 141 138 136 138 140 141  K 2.9* 2.8* 3.4* 4.0 3.6 3.5  CL 101 102 104 110 108 104  CO2 26 26 26 24  21* 25  GLUCOSE 158* 117* 100* 85 98 117*  BUN 21* 20 15 13 15 13   CREATININE 0.95 0.93 0.66 0.74 0.85 0.98  CALCIUM 8.3* 8.2* 8.1* 8.1* 9.2 8.7*  MG 1.9  --   --   --   --   --    GFR: Estimated Creatinine Clearance: 64.6 mL/min (by C-G formula based on SCr of 0.98 mg/dL). Liver Function Tests:  Recent Labs Lab 04/02/17 0113 04/02/17 0543 04/03/17 0611 04/04/17 1329 04/06/17 0208  AST 33 23 30 32 19  ALT 21 19 28  44 31  ALKPHOS 66 48 41 62 55  BILITOT 0.8 0.7 0.4 0.8 0.5  PROT 8.2* 6.6 5.4* 6.9 6.4*  ALBUMIN 5.1* 3.9 3.0* 3.9 3.8    Recent Labs Lab 03/31/17 1200 04/02/17 0113 04/06/17 0208  LIPASE 32 16 18   No results for input(s): AMMONIA in the last  168 hours. Coagulation Profile: No results for input(s): INR, PROTIME in the last 168 hours. Cardiac Enzymes: No results for input(s): CKTOTAL, CKMB, CKMBINDEX, TROPONINI in the last 168 hours. BNP (last 3 results) No results for input(s): PROBNP in the last 8760 hours. HbA1C: No results for input(s): HGBA1C in the last 72 hours. CBG:  Recent Labs Lab 04/02/17 2154 04/03/17 0735 04/03/17 1135 04/03/17 1718 04/03/17 2128  GLUCAP 95 93 87 94 77   Lipid Profile: No results for input(s): CHOL, HDL, LDLCALC, TRIG, CHOLHDL, LDLDIRECT in the last 72 hours. Thyroid Function Tests: No results for input(s): TSH, T4TOTAL, FREET4, T3FREE, THYROIDAB in  the last 72 hours. Anemia Panel: No results for input(s): VITAMINB12, FOLATE, FERRITIN, TIBC, IRON, RETICCTPCT in the last 72 hours. Urine analysis:    Component Value Date/Time   COLORURINE AMBER (A) 04/06/2017 0321   APPEARANCEUR HAZY (A) 04/06/2017 0321   LABSPEC 1.028 04/06/2017 0321   PHURINE 5.0 04/06/2017 0321   GLUCOSEU NEGATIVE 04/06/2017 0321   HGBUR MODERATE (A) 04/06/2017 0321   BILIRUBINUR SMALL (A) 04/06/2017 0321   KETONESUR 20 (A) 04/06/2017 0321   PROTEINUR 30 (A) 04/06/2017 0321   UROBILINOGEN 0.2 07/02/2015 1757   NITRITE NEGATIVE 04/06/2017 0321   LEUKOCYTESUR NEGATIVE 04/06/2017 0321   Sepsis Labs: @LABRCNTIP (procalcitonin:4,lacticidven:4) ) Recent Results (from the past 240 hour(s))  Culture, Urine     Status: Abnormal   Collection Time: 04/02/17  4:15 AM  Result Value Ref Range Status   Specimen Description URINE, CLEAN CATCH  Final   Special Requests NONE  Final   Culture MULTIPLE SPECIES PRESENT, SUGGEST RECOLLECTION (A)  Final   Report Status 04/03/2017 FINAL  Final     Radiological Exams on Admission: Nm Hepato W/eject Fract  Result Date: 04/04/2017 CLINICAL DATA:  Nausea. EXAM: NUCLEAR MEDICINE HEPATOBILIARY IMAGING WITH GALLBLADDER EF TECHNIQUE: Sequential images of the abdomen were obtained out  to 60 minutes following intravenous administration of radiopharmaceutical. After oral ingestion of Ensure, gallbladder ejection fraction was determined. At 60 min, normal ejection fraction is greater than 33%. RADIOPHARMACEUTICALS:  4.95 mCi Tc-64m Choletec IV COMPARISON:  04/03/2017 FINDINGS: Prompt uptake and biliary excretion of activity by the liver is seen. Gallbladder activity is visualized, consistent with patency of cystic duct. Biliary activity passes into small bowel, consistent with patent common bile duct. Calculated gallbladder ejection fraction is 21%. (Normal gallbladder ejection fraction with Ensure is greater than 33%.) IMPRESSION: 1. Patent cystic duct without evidence for acute cholecystitis. 2. Abnormally low gallbladder ejection fraction at 21 % which reflect biliary dyskinesis. Electronically Signed   By: TKerby MoorsM.D.   On: 04/04/2017 15:56     Assessment/Plan Principal Problem:   Nausea & vomiting    1. Intractable nausea and vomiting - will keep patient nothing by mouth for now. Since patient's recent HIDA scan showed biliary dyskinesia consult general surgery in a.m. for further recommendations. Continue with fluids and antiemetics. Avoid narcotics.   DVT prophylaxis: SCDs. Code Status: Full code.  Family Communication: Discussed with patient.  Disposition Plan: Home.  Consults called: None.  Admission status: Observation.    KRise PatienceMD [facility] Pager [phone]  If 7PM-7AM, please contact night-coverage [email or web address] Password [email or web address] 04/06/2017, 6:12 AM

## 2017-04-06 NOTE — ED Triage Notes (Signed)
Pt states she was admitted into the hospital on Sunday and signed herself out AMA because she had to take care of her child   Pt states she was admitted because she has a kidney stone in her left kidney and a kidney infection and she has gallbladder problems  Pt states today the pain is worse and she has nausea and vomiting   Pt states Dr Crista Elliot was her dr

## 2017-04-06 NOTE — ED Notes (Signed)
Bed: [facility]1 Expected date:  Expected time:  Means of arrival:  Comments: 

## 2017-04-06 NOTE — Care Management Note (Signed)
Case Management Note  Patient Details  Name: Jeanne Mcneil MRN: [id number] Date of Birth: 09/15/1986  Subjective/Objective:                  31 y.o. female with recently admitted for nausea vomiting and patient left AMA 2 days ago presents back to the ER with persistent nausea vomiting. During last admission patient had HIDA scan which showed features concerning for biliary dyskinesia and surgery was planning to have discussion with patient about possible cholecystectomy. MRA of the abdomen done showed left renal cyst.   ED Course: On exam patient's abdomen appears benign. Patient denies any diarrhea. Has been having multiple episodes of nausea and vomiting. Tox screen is positive for opiates benzo and marijuana. Patient is being admitted for further observation.  Action/Plan: Date:  Apr 06, 2017  Chart reviewed for concurrent status and case management needs.  Will continue to follow patient progress.  Discharge Planning: following for needs  Expected discharge date: [id number]  Velva Harman, BSN, [place], [place]   Expected Discharge Date:                  Expected Discharge Plan:  Home/Self Care  In-House Referral:     Discharge planning Services  CM Consult  Post Acute Care Choice:    Choice offered to:     DME Arranged:    DME Agency:     HH Arranged:    HH Agency:     Status of Service:  In process, will continue to follow  If discussed at Long Length of Stay Meetings, dates discussed:    Additional Comments:  Leeroy Cha, RN 04/06/2017, 9:51 AM

## 2017-04-06 NOTE — Progress Notes (Signed)
Arrived to give pt her pain meds and found pt  in the shower stating she had a spasm in her gallbladder and had to get into the shower.

## 2017-04-06 NOTE — ED Notes (Signed)
Pts continues to drink water, even when ask to not to have anything until she has seen the EDP

## 2017-04-06 NOTE — ED Notes (Signed)
Patient is aware that a urine specimen is ordered and said she was unable to void until she was given water to drink. RN aware.

## 2017-04-06 NOTE — ED Provider Notes (Signed)
[facility] DEPT Provider Note   CSN: [id number] Arrival date & time: 04/05/17  2316 By signing my name below, I, Dyke Brackett, attest that this documentation has been prepared under the direction and in the presence of non-physician practitioner, Abigail Butts, PA-C. Electronically Signed: Dyke Brackett, Scribe. 04/06/2017. 1:36 AM.   History   Chief Complaint Chief Complaint  Patient presents with  . Abdominal Pain   HPI Jeanne Mcneil is a 31 y.o. female with a history  who presents to the Emergency Department complaining of persistent, severe abdominal pain onset one week ago. She reports associated decreased urine output, persistent vomiting, generalized body aches, nausea and chest pain. No alleviating or modifying factors noted.  Pt was admitted to the hospital for the same on 04/02/17 where she was diagnosed with a UTI and renal stones. Emesis had improved. Patient also had HIDA scan: Patent cystic duct with no evidence of acute cholecystitis ejection fraction is 21%. Normal being greater than 33%.   Pt signed herself out AMA to take care of her daughter. She states she has taken care of her family situation at home, and will be able to stay in the hospital if admitted again. Pt reports she's had persistent vomiting at home. She reports she has completed her family duties and has returned for further treatments. She states she is willing to stay and sees no reason why she would need to leave again.   The history is provided by the patient. No language interpreter was used.   Past Medical History:  Diagnosis Date  . Anxiety   . Back pain   . Constipation   . Depression   . GERD (gastroesophageal reflux disease)   . Glaucoma of both eyes   . Hematuria   . History of abnormal cervical Pap smear   . History of chronic gastritis   . History of kidney stones   . Hydronephrosis, right   . Renal calculi    bilateral per ct 0902-2017  . Right ureteral stone   . Urgency  of urination   . Uterine fibroid   . Uterine polyp     Patient Active Problem List   Diagnosis Date Noted  . Nausea & vomiting 04/02/2017  . Acute renal failure ([place]) 01/16/2017  . Hyponatremia 01/16/2017  . Leukocytosis 01/16/2017  . Nausea and vomiting 01/15/2017  . Chest pain 01/15/2017  . Pyelonephritis 10/11/2016  . Hypokalemia 10/11/2016  . Hyperglycemia 10/11/2016  . Elevated blood pressure reading 10/11/2016  . Left nephrolithiasis 10/11/2016  . Renal colic on left side 73/40/3709  . Generalized anxiety disorder 10/11/2016  . Nausea with vomiting 07/26/2016  . Ureteral stone 07/04/2016  . UTI (urinary tract infection) 07/04/2016  . Hydronephrosis of right kidney 07/04/2016  . GERD (gastroesophageal reflux disease) 07/04/2016  . Ureteral calculi 07/04/2016  . Abdominal pain, chronic, epigastric   . Hematemesis 05/16/2016  . Heme + stool 05/16/2016  . Abdominal pain, epigastric 05/16/2016    Past Surgical History:  Procedure Laterality Date  . CYSTOSCOPY W/ URETERAL STENT PLACEMENT Right 07/04/2016   Procedure: CYSTOSCOPY WITH RIGHT RETROGRADE PYELOGRAM, RIGHT URETERAL STENT PLACEMENT;  Surgeon: Ardis Hughs, MD;  Location: [facility];  Service: Urology;  Laterality: Right;  . CYSTOSCOPY WITH RETROGRADE PYELOGRAM, URETEROSCOPY AND STENT PLACEMENT Left 11/28/2007  . CYSTOSCOPY WITH RETROGRADE PYELOGRAM, URETEROSCOPY AND STENT PLACEMENT Right 08/02/2016   Procedure: CYSTOSCOPY WITH RIGHT  RETROGRADE PYELOGRAM, URETEROSCOPY,  AND STENT EXCHANGE;  Surgeon: Ardis Hughs, MD;  Location: [facility];  Service: Urology;  Laterality: Right;  . ESOPHAGOGASTRODUODENOSCOPY N/A 05/17/2016   Procedure: ESOPHAGOGASTRODUODENOSCOPY (EGD);  Surgeon: Danie Binder, MD;  Location: [facility];  Service: Endoscopy;  Laterality: N/A;  830   . INDUCED ABORTION  Jan 25, 2008    OB History    Gravida Para Term Preterm AB Living   3 2 2   1 2    SAB TAB Ectopic Multiple Live  Births   1       2       Home Medications    Prior to Admission medications   Medication Sig Start Date End Date Taking? Authorizing Provider  pantoprazole (PROTONIX) 40 MG tablet Take 1 tablet (40 mg total) by mouth 2 (two) times daily. 01/18/17  Yes Nita Sells, MD  promethazine (PHENERGAN) 25 MG tablet Take 1 tablet (25 mg total) by mouth every 6 (six) hours as needed for nausea. 10/18/16  Yes Varney Biles, MD  ranitidine (ZANTAC) 150 MG tablet Take 150 mg by mouth 2 (two) times daily.   Yes [provider]    Family History Family History  Problem Relation Age of Onset  . Colon cancer Paternal Uncle        Passed away 24-Jan-2015  . Nephrolithiasis Father   . Hypertension Father   . Nephrolithiasis Sister   . Nephrolithiasis Sister   . Nephrolithiasis Sister   . Hypertension Mother     Social History Social History  Substance Use Topics  . Smoking status: Current Every Day Smoker    Packs/day: 0.50    Years: 20.00    Types: Cigarettes  . Smokeless tobacco: Never Used     Comment: 5-7 cigarettes daily   . Alcohol use 2.4 oz/week    4 Shots of liquor per week     Comment: No ETOH in 4 months; Previously: occasionally, once every 2-3 months.     Allergies   Keflex [cephalexin]; Codeine; Tape; and Vicodin [hydrocodone-acetaminophen]   Review of Systems Review of Systems  Constitutional: Negative for fever.  Cardiovascular: Positive for chest pain.  Gastrointestinal: Positive for abdominal pain, nausea and vomiting.  Genitourinary: Positive for decreased urine volume.  Musculoskeletal: Positive for myalgias.  All other systems reviewed and are negative.  Physical Exam Updated Vital Signs BP (!) 171/108 (BP Location: Left Arm)   Pulse 82   Temp 98.3 F (36.8 C) (Oral)   Resp 20   LMP 03/31/2017 (Within Days)   SpO2 99%   Physical Exam  Constitutional: She appears well-developed and well-nourished. No distress.  Awake, alert, nontoxic  appearance  HENT:  Head: Normocephalic and atraumatic.  Mouth/Throat: Oropharynx is clear and moist. No oropharyngeal exudate.  Eyes: Conjunctivae are normal. No scleral icterus.  Neck: Normal range of motion. Neck supple.  Cardiovascular: Normal rate, regular rhythm and intact distal pulses.   Pulmonary/Chest: Effort normal and breath sounds normal. No respiratory distress. She has no wheezes.  Equal chest expansion  Abdominal: Soft. Bowel sounds are normal. She exhibits no mass. There is tenderness in the right upper quadrant. There is CVA tenderness (left sided). There is no rigidity, no rebound and no guarding.  Musculoskeletal: Normal range of motion. She exhibits no edema.  Neurological: She is alert.  Speech is clear and goal oriented Moves extremities without ataxia  Skin: Skin is warm and dry. She is not diaphoretic.  Psychiatric: She has a normal mood and affect.  Nursing note and vitals reviewed.  ED Treatments / Results  DIAGNOSTIC STUDIES:  Oxygen Saturation is 99% on RA, normal by my interpretation.    COORDINATION OF CARE:  2:07 AM Discussed treatment plan with pt at bedside and pt agreed to plan.   Labs (all labs ordered are listed, but only abnormal results are displayed) Labs Reviewed  CBC WITH DIFFERENTIAL/PLATELET - Abnormal; Notable for the following:       Result Value   WBC 11.2 (*)    Neutro Abs 8.7 (*)    All other components within normal limits  COMPREHENSIVE METABOLIC PANEL - Abnormal; Notable for the following:    Glucose, Bld 117 (*)    Calcium 8.7 (*)    Total Protein 6.4 (*)    All other components within normal limits  URINALYSIS, ROUTINE W REFLEX MICROSCOPIC - Abnormal; Notable for the following:    Color, Urine AMBER (*)    APPearance HAZY (*)    Hgb urine dipstick MODERATE (*)    Bilirubin Urine SMALL (*)    Ketones, ur 20 (*)    Protein, ur 30 (*)    Squamous Epithelial / LPF 6-30 (*)    All other components within normal limits    LIPASE, BLOOD  PREGNANCY, URINE  RAPID URINE DRUG SCREEN, HOSP PERFORMED  I-STAT CG4 LACTIC ACID, ED    Radiology Nm Hepato W/eject Fract  Result Date: 04/04/2017 CLINICAL DATA:  Nausea. EXAM: NUCLEAR MEDICINE HEPATOBILIARY IMAGING WITH GALLBLADDER EF TECHNIQUE: Sequential images of the abdomen were obtained out to 60 minutes following intravenous administration of radiopharmaceutical. After oral ingestion of Ensure, gallbladder ejection fraction was determined. At 60 min, normal ejection fraction is greater than 33%. RADIOPHARMACEUTICALS:  4.95 mCi Tc-87m Choletec IV COMPARISON:  04/03/2017 FINDINGS: Prompt uptake and biliary excretion of activity by the liver is seen. Gallbladder activity is visualized, consistent with patency of cystic duct. Biliary activity passes into small bowel, consistent with patent common bile duct. Calculated gallbladder ejection fraction is 21%. (Normal gallbladder ejection fraction with Ensure is greater than 33%.) IMPRESSION: 1. Patent cystic duct without evidence for acute cholecystitis. 2. Abnormally low gallbladder ejection fraction at 21 % which reflect biliary dyskinesis. Electronically Signed   By: TKerby MoorsM.D.   On: 04/04/2017 15:56    Procedures Procedures (including critical care time)  Medications Ordered in ED Medications  promethazine (PHENERGAN) injection 25 mg (not administered)  sodium chloride 0.9 % bolus 1,000 mL (1,000 mLs Intravenous New Bag/Given 04/06/17 0226)     Initial Impression / Assessment and Plan / ED Course  I have reviewed the triage vital signs and the nursing notes.  Pertinent labs & imaging results that were available during my care of the patient were reviewed by me and considered in my medical decision making (see chart for details).     Patient has returned to the ER for admission for further treatment of her medical problems. She was discharged AMA yesterday. Labs are reassuring today. She will be readmitted  for continued treatment.  Discussed with Dr. KHal Hopewho will admit.  Final Clinical Impressions(s) / ED Diagnoses   Final diagnoses:  Non-intractable vomiting with nausea, unspecified vomiting type  Epigastric pain    New Prescriptions New Prescriptions   No medications on file    I personally performed the services described in this documentation, which was scribed in my presence. The recorded information has been reviewed and is accurate.     Azzam Mehra, HGwenlyn Perking05/18/18 0405    Palumbo, April, MD 04/06/17 0Zettie Cooley

## 2017-04-06 NOTE — Progress Notes (Signed)
Nursing staff checking on patient status. Pt continues to stay in shower after repeatedly being told that she needs to not stay in the shower for extended amounts of time. Nursing staff returned to patients room to find patient sitting in front of commode trying to induce vomiting. Pt sitting in front of commode without clothes on. Pt was told that she needs to return to her bed so that the nursing staff can provide nursing care to pt. Pt refusing to leave the bathroom at this time. RN and Nursing Director spoke with pt in room. Pt continues to stay in bathroom trying to physically induce vomiting. Nursing staff will continue to monitor patient. Pt stable at current time.  Earle Troiano W Kion Huntsberry, RN

## 2017-04-06 NOTE — Progress Notes (Signed)
This nurse in for assessment and to give Zofran - pt sitting on bed with bath basin with clear frothy emesis - rocking back in forth in bed c/o pain in her "pee hole) . Gallbladder & kidney wanting pain meds. Pt excused herself  to the bathroom to vomit ( sitting on floor in front of toilet) Calls out to me requesting a GI cocktail. Advised her again we are awating the doctor for eval. I am unable to complete my daily assessment at this time

## 2017-04-06 NOTE — Progress Notes (Signed)
CC:  Nausea and vomiting  Subjective: Patient hospitalized multiple times with abdominal pain nausea and vomiting. She was admitted on 5/14 and we saw her on 5/15 for possible cholecystitis. Workup at that time showed chronic abdominal pain, ultrasound showed some gallbladder wall thickening, and some minimal fluid surrounding the gallbladder on MRI with no gallstones. HIDA scan shows a patent cystic duct without evidence for acute cholecystitis. Ejection fraction was slightly diminished at 21 with the normal being 33%. Patient signed out [place] on 04/04/17. During her hospitalization, she spent most of her time in the shower, Dr. Erlinda Hong said this is quite common with cannabinoid hyperemesis. She returned early this a.m. with progressive nausea and vomiting. She was afebrile on readmission BP was elevated. Labs this a.m. shows her CMP is essentially normal except for total protein of 6.4. WBC is actually improved down to 11.2. Hemoglobin and hematocrit are stable. Drug screen is positive for benzodiazepines/opiates/THC. We are asked to see again.  Objective: Vital signs in last 24 hours: Temp:  [98.3 F (36.8 C)-98.9 F (37.2 C)] 98.9 F (37.2 C) (05/18 0522) Pulse Rate:  [67-82] 69 (05/18 0522) Resp:  [16-20] 16 (05/18 0522) BP: (122-171)/(68-108) 122/88 (05/18 0522) SpO2:  [96 %-100 %] 96 % (05/18 0522) Last BM Date: 04/05/17  Intake/Output from previous day: No intake/output data recorded. Intake/Output this shift: Total I/O In: 120 [P.O.:120] Out: -  She is in the shower again I reports emesis.  General appearance: alert, cooperative and no distress Resp: clear to auscultation bilaterally Cardio: regular rate and rhythm, S1, S2 normal, no murmur, click, rub or gallop GI: She complains of pain significantly on the left side and back now says it feels like a kidney stone. Continues to complain of some right upper quadrant discomfort and across her mid abdomen. Exam is otherwise  unremarkable. She is soft she is tender to palpation intermittently. She has bowel sounds. She is not distended she has no peritonitis. Extremities: extremities normal, atraumatic, no cyanosis or edema  Lab Results:   Recent Labs  04/04/17 1329 04/06/17 0208  WBC 13.7* 11.2*  HGB 12.3 12.0  HCT 37.0 36.1  PLT 363 339    BMET  Recent Labs  04/04/17 1329 04/06/17 0208  NA 140 141  K 3.6 3.5  CL 108 104  CO2 21* 25  GLUCOSE 98 117*  BUN 15 13  CREATININE 0.85 0.98  CALCIUM 9.2 8.7*   PT/INR No results for input(s): LABPROT, INR in the last 72 hours.   Recent Labs Lab 04/02/17 0113 04/02/17 0543 04/03/17 0611 04/04/17 1329 04/06/17 0208  AST 33 23 30 32 19  ALT 21 19 28  44 31  ALKPHOS 66 48 41 62 55  BILITOT 0.8 0.7 0.4 0.8 0.5  PROT 8.2* 6.6 5.4* 6.9 6.4*  ALBUMIN 5.1* 3.9 3.0* 3.9 3.8     Lipase     Component Value Date/Time   LIPASE 18 04/06/2017 0208     Medications: . pantoprazole (PROTONIX) IV  40 mg Intravenous Daily   . sodium chloride     Anti-infectives    None      Assessment/Plan Chronic abdominal pain Gallbladder wall thickening/pericholecystic fluid/no gallstones/HIDA negative decreased EF ? Cannabinoid hyperemesis. Drug screen positive for THC/opiate/benzodiazepine Urine culture 04/03/17  - Multiple species Nephrolithiasis GERD Chronic gastritis Uterine fibroids Anxiety/PTSD  - Followed by [facility] Ongoing tobacco use FEN:  IV fluids/NPO ID:  Rocephin 5/13 =>> day 4 flagyl 5/16 =>> x 1  dose DVT:  SCD's only    Plan:  Review with DR. Gerkin   LOS: 0 days    Tavon Magnussen 04/06/2017 [phone]

## 2017-04-06 NOTE — Progress Notes (Addendum)
Patient seen and examined this morning, admitted by Dr. Glyn Ade overnight H&P reviewed and agree with the assessment and plan,   In brief, this is a 31 year old female with history of recurrent nephrolithiasis followed by urology as an outpatient, who presents to the emergency room with intractable nausea and vomiting.  She was hospitalized and left AGAINST MEDICAL ADVICE 2 days ago.  During that hospitalization, she was evaluated by urology as well as general surgery, with concerns for recurrent nephrolithiasis as well as concern for acute cholecystitis.  She underwent a HIDA scan which showed patent cystic duct, and slightly decreased ejection fraction at 21%, reflecting biliary dyskinesis.   Intractable nausea and vomiting and abdominal pain -Reconsulted general surgery to evaluate patient for an abnormal HIDA scan, awaiting recommendations -Conservative management meanwhile -Of note, she has been having chronic abdominal pain chronic nausea and vomiting.  This is believed to be due in part to marijuana use, and she continues to smoke marijuana.  She was seen by gastroenterology in 2017, and underwent an upper endoscopy which showed reflux esophagitis, grade A, and gastritis.  Low-fat diet as well as complete tobacco cessation was recommended at that time.  She is still smoking, and it is possible that her abdominal pain nausea and vomiting may very well be related to her known gastritis and esophagitis for which she is noncompliant with the recommendations. -Per nursing notes and RN report, patient does exhibit concerning behavior as was seen in the hospital bathroom to self induce vomiting  Patient with history of nephrolithiasis  -followed by Dr. Kary Kos from urology as an outpatient, she is status post stone removal in 2017 with stent placement.  I obtained a CT renal stone study today, which showed a mobile 6 mm stone in the left renal pelvis without any obstructive findings, and tiny stones  in the upper left lower poles of the left kidneys.  I have discussed these findings with Dr. Tresa Moore the urologist on-call, and he recommends no further inpatient procedures or testing, and she will need to have outpatient follow-up with her primary urologist  Tobacco abuse and marijuana abuse -Counseled for cessation  Carrieanne Kleen M. Cruzita Lederer, MD [facility] [phone]  If 7PM-7AM, please contact night-coverage [email or web address] Password [email or web address]

## 2017-04-07 DIAGNOSIS — F1210 Cannabis abuse, uncomplicated: Secondary | ICD-10-CM

## 2017-04-07 LAB — BASIC METABOLIC PANEL
Anion gap: 7 (ref 5–15)
BUN: 10 mg/dL (ref 6–20)
CALCIUM: 8.1 mg/dL — AB (ref 8.9–10.3)
CHLORIDE: 103 mmol/L (ref 101–111)
CO2: 27 mmol/L (ref 22–32)
CREATININE: 0.87 mg/dL (ref 0.44–1.00)
GFR calc non Af Amer: >60 mL/min (ref >60)
Glucose, Bld: 83 mg/dL (ref 65–99)
Potassium: 3.2 mmol/L — ABNORMAL LOW (ref 3.5–5.1)
Sodium: 137 mmol/L (ref 135–145)

## 2017-04-07 LAB — CBC
HCT: 31 % — ABNORMAL LOW (ref 36.0–46.0)
Hemoglobin: 10.3 g/dL — ABNORMAL LOW (ref 12.0–15.0)
MCH: 29.4 pg (ref 26.0–34.0)
MCHC: 33.2 g/dL (ref 30.0–36.0)
MCV: 88.6 fL (ref 78.0–100.0)
PLATELETS: 256 10*3/uL (ref 150–400)
RBC: 3.5 MIL/uL — AB (ref 3.87–5.11)
RDW: 15.5 % (ref 11.5–15.5)
WBC: 6.5 10*3/uL (ref 4.0–10.5)

## 2017-04-07 LAB — GLUCOSE, CAPILLARY
GLUCOSE-CAPILLARY: 98 mg/dL (ref 65–99)
Glucose-Capillary: 85 mg/dL (ref 65–99)

## 2017-04-07 NOTE — Progress Notes (Signed)
Subjective/Chief Complaint: No real pain this am, no n/v right now   Objective: Vital signs in last 24 hours: Temp:  [98.1 F (36.7 C)-98.6 F (37 C)] 98.1 F (36.7 C) (05/19 0523) Pulse Rate:  [50-62] 50 (05/19 0523) Resp:  [16-17] 17 (05/19 0523) BP: (110-135)/(55-77) 110/55 (05/19 0523) SpO2:  [95 %-99 %] 96 % (05/19 0523) Last BM Date: 04/05/17  Intake/Output from previous day: 05/18 0701 - 05/19 0700 In: 2388.3 [P.O.:240; I.V.:2148.3] Out: -  Intake/Output this shift: No intake/output data recorded.  GI: soft nt/nd this am  Lab Results:   Recent Labs  04/06/17 0208 04/07/17 0622  WBC 11.2* 6.5  HGB 12.0 10.3*  HCT 36.1 31.0*  PLT 339 256   BMET  Recent Labs  04/06/17 0208 04/07/17 0622  NA 141 137  K 3.5 3.2*  CL 104 103  CO2 25 27  GLUCOSE 117* 83  BUN 13 10  CREATININE 0.98 0.87  CALCIUM 8.7* 8.1*   PT/INR No results for input(s): LABPROT, INR in the last 72 hours. ABG No results for input(s): PHART, HCO3 in the last 72 hours.  Invalid input(s): PCO2, PO2  Studies/Results: Ct Renal Stone Study  Result Date: 04/06/2017 CLINICAL DATA:  Pain. EXAM: CT ABDOMEN AND PELVIS WITHOUT CONTRAST TECHNIQUE: Multidetector CT imaging of the abdomen and pelvis was performed following the standard protocol without IV contrast. COMPARISON:  01/15/2017 and 10/11/2016 FINDINGS: Lower chest: Normal. Hepatobiliary: Focal fatty infiltration adjacent to the falciform ligament, unchanged. Liver parenchyma is otherwise normal. Normal biliary tree. Pancreas: Unremarkable. No pancreatic ductal dilatation or surrounding inflammatory changes. Spleen: Normal in size without focal abnormality. Adrenals/Urinary Tract: Nonobstructing 6 mm stone in the left renal pelvis. Tiny stones in the upper and lower poles of the left kidney. Previously demonstrated cyst in the upper pole of the left kidney is quite subtle on this exam. No ureteral or bladder calculi. Adrenal glands are  normal. Stomach/Bowel: Stomach is within normal limits. Appendix appears normal. No evidence of bowel wall thickening, distention, or inflammatory changes. Vascular/Lymphatic: No significant vascular findings are present. No enlarged abdominal or pelvic lymph nodes. Reproductive: Uterus and ovaries are normal except for slight exophytic fibroid on the left side of the uterine fundus, 2.3 cm. Cyst seen on the right ovary on the prior exam has resolved. Other: No abdominal wall hernia or abnormality. No abdominopelvic ascites. Musculoskeletal: No acute or significant osseous findings. IMPRESSION: Mobile 6 mm stone in the left renal pelvis. No obstructive findings on the current exam. Tiny stones in the upper and lower poles of the left kidney. Small fibroid in the uterine fundus. Otherwise, benign appearing abdomen. Electronically Signed   By: Lorriane Shire M.D.   On: 04/06/2017 11:31    Anti-infectives: Anti-infectives    None      Assessment/Plan: N/V, abd pain  She has long history and I have reviewed all imaging, notes and endoscopy reports.  Her [place], mrcp and hida lately shows mildly decreased ef, no cholecystitis, no stones or sludge present. There is "tiny" amount pericholecystic fluid and mild gbw thickening. Her symptoms have been present for a long time and all other tests are negative. I do not think that these symptoms are necessarily gb related.  Not sure they are from marijuana either.   I discussed with her today that she may end up having conversation about lap chole but needs to see gi again first and would recommend a gastric emptying study. If that is all negative I  can see back in office to discuss but there is significant chance that surgery will not cure her symptoms.  She is angry about symptoms today and mgt as well.    [facility] 04/07/2017

## 2017-04-07 NOTE — Progress Notes (Signed)
Prescriptions given.

## 2017-04-07 NOTE — Progress Notes (Signed)
Pt discharging at this time. Frustrated with hospital stay and no "answers". Discharge instructions given/explained with pt verbalizing understanding.  Followup appointment noted.

## 2017-04-07 NOTE — Discharge Summary (Signed)
Physician Discharge Summary  Jeanne Mcneil GYI:[id number] DOB: 1986-04-04 DOA: 04/06/2017  PCP: Nolene Ebbs, MD  Admit date: 04/06/2017 Discharge date: 04/07/2017  Admitted From: home Disposition:  home  Recommendations for Outpatient Follow-up:  1. Follow up with PCP in 1-2 weeks 2. Follow up with Dr. Oneida Alar in 1-2 weeks  Home Health: none  Equipment/Devices: none   Discharge Condition: stable CODE STATUS: Full code Diet recommendation: low fat  HPI: Per Dr. Glyn Ade, Jeanne Mcneil is a 31 y.o. female with recently admitted for nausea vomiting and patient left AMA 2 days ago presents back to the ER with persistent nausea vomiting. During last admission patient had HIDA scan which showed features concerning for biliary dyskinesia and surgery was planning to have discussion with patient about possible cholecystectomy. MRA of the abdomen done showed left renal cyst. ED Course: On exam patient's abdomen appears benign. Patient denies any diarrhea. Has been having multiple episodes of nausea and vomiting. Tox screen is positive for opiates benzo and marijuana. Patient is being admitted for further observation.  Hospital Course: Discharge Diagnoses:  Principal Problem:   Nausea & vomiting Active Problems:   Nausea and vomiting  Intractable nausea and vomiting and abdominal pain -patient was admitted to the hospital for intractable nausea vomiting and abdominal pain.  Given abnormal HIDA scan during her prior hospitalization, general surgery was consulted again.  They recommended conservative management for now, and no role for cholecystectomy at this point.  Of note, patient has been having chronic abdominal pain nausea and vomiting lasting at least several years.  She has had evaluation by gastroenterology in 2017.  She underwent an upper endoscopy 2017 which showed reflux esophagitis grade A, as well as gastritis.   Low-fat diet as well as complete tobacco cessation was  recommended at that time.  Patient however still smoking, I discussed extensively with her and tells me she is unable to quit.  During the hospital stay, per nursing notes and bedside RN report, patient was seen in the hospital bathroom to induce self-induced vomiting.  Patient got extremely upset once surgery did not want to operate on her and wanted to leave the hospital right away.  On my repeated evaluations, she appeared always comfortable, did not see any active vomiting.  She did not want to stay in the hospital to see how well she tolerates a regular diet, and asked for discharge.  As no further tests are plan in the inpatient setting, she obtained maximum benefit from her hospital stay, and she will be discharged home in stable condition.  There is a question of patient having gastroparesis, she is not a known diabetic, she was advised to follow-up with outpatient gastroenterology. Patient with history of nephrolithiasis -followed by Dr. Kary Kos from urology as an outpatient, she is status post stone removal in 2017 with stent placement.  I obtained a CT renal stone study, which showed a mobile 6 mm stone in the left renal pelvis without any obstructive findings, and tiny stones in the upper left lower poles of the left kidneys.  I have discussed these findings with Dr. Tresa Moore the urologist on-call, and he recommends no further inpatient procedures or testing, and she will need to have outpatient follow-up with her primary urologist and the clinic will arrange that Tobacco abuse and marijuana abuse -Counseled for cessation, cannot fully exclude her nausea vomiting is related to her marijuana use, especially given the fact that she is to take long hot showers for her symptoms  to improve.  She was asked to completely give up tobacco and marijuana, and repeatedly told me that she is unable to do so because she is has too much stress in her life with her kids.   Discharge Instructions   Allergies as of  04/07/2017      Reactions   Keflex [cephalexin] Other (See Comments)   Reaction:  Bladder spasms   Codeine Hives, Itching   Tape Other (See Comments)   Reaction:  Burning    Vicodin [hydrocodone-acetaminophen] Hives, Itching      Medication List    TAKE these medications   gi cocktail Susp suspension Take 30 mLs by mouth 3 (three) times daily as needed for indigestion. Shake well.   ketorolac 10 MG tablet Commonly known as:  TORADOL Take 1 tablet (10 mg total) by mouth every 6 (six) hours as needed.   pantoprazole 40 MG tablet Commonly known as:  PROTONIX Take 1 tablet (40 mg total) by mouth 2 (two) times daily.   promethazine 25 MG tablet Commonly known as:  PHENERGAN Take 1 tablet (25 mg total) by mouth every 6 (six) hours as needed for nausea.   ranitidine 150 MG tablet Commonly known as:  ZANTAC Take 1 tablet (150 mg total) by mouth 2 (two) times daily.      Follow-up Information    Rolm Bookbinder, MD Follow up today.   Specialty:  General Surgery Why:  if needed after further testing Contact information: [address] [place] [address] [phone]          Allergies  Allergen Reactions  . Keflex [Cephalexin] Other (See Comments)    Reaction:  Bladder spasms  . Codeine Hives and Itching  . Tape Other (See Comments)    Reaction:  Burning   . Vicodin [Hydrocodone-Acetaminophen] Hives and Itching    Consultations:  General surgery  Urology over the phone  Procedures/Studies:  Mr Abdomen W Wo Contrast  Result Date: 04/02/2017 CLINICAL DATA:  Abdominal pain, body aches. EXAM: MRI ABDOMEN WITHOUT AND WITH CONTRAST TECHNIQUE: Multiplanar multisequence MR imaging of the abdomen was performed both before and after the administration of intravenous contrast. CONTRAST:  43m MULTIHANCE GADOBENATE DIMEGLUMINE 529 MG/ML IV SOLN COMPARISON:  None. FINDINGS: Lower chest:  Lung bases are clear. Hepatobiliary: No focal hepatic lesion. No no  duct dilatation. Small amount pericholecystic fluid. No gallstones evident. Pancreas: Normal pancreatic parenchymal intensity. No ductal dilatation or inflammation. Spleen: Normal spleen. Adrenals/urinary tract: Adrenal glands normal. Round nonenhancing lesion in the upper pole of the LEFT kidney measures 11 mm (image 13, series 903). This lesion is hyperintense on T2 weighted imaging and corresponds to the indeterminate lesion on comparison ultrasound. No additional renal lesions. Stomach/Bowel: Stomach and limited of the small bowel is unremarkable Vascular/Lymphatic: Abdominal aortic normal caliber. No retroperitoneal periportal lymphadenopathy. Musculoskeletal: No aggressive osseous lesion IMPRESSION: 1. Benign Bosniak 1 renal cysts the upper pole of the LEFT kidney corresponds to the indeterminate lesion on comparison ultrasound. 2. Small amount pericholecystic fluid.  No gallstones evident. Electronically Signed   By: SSuzy BouchardM.D.   On: 04/02/2017 16:27   [place]al  Result Date: 04/02/2017 CLINICAL DATA:  Nephrolithiasis EXAM: RENAL / URINARY TRACT ULTRASOUND COMPLETE COMPARISON:  CT abdomen and pelvis January 15, 2017 FINDINGS: Right Kidney: Length: 12.6 cm. Echogenicity and renal cortical thickness are within normal limits. No mass, perinephric fluid, or hydronephrosis visualized. No sonographically demonstrable calculus or ureterectasis. Left Kidney: Length: 11.6 cm. Echogenicity and  renal cortical thickness are within normal limits. No perinephric fluid or hydronephrosis visualized. There is a complex mass in the medial upper pole left kidney which contains multiple thickened septations measuring 1.3 x 1.6 x 1.3 cm. No sonographically demonstrable calculus or ureterectasis. Bladder: Appears normal for degree of bladder distention. IMPRESSION: Somewhat complex appearing mass in the medial upper pole left kidney measuring 1.3 x 1.6 x 1.3 cm. Further evaluation with pre and post contrast MRI or  CT should be considered. MRI is preferred in younger patients (due to lack of ionizing radiation) and for evaluating calcified lesion(s). No calculi evident.  Study otherwise unremarkable. Electronically Signed   By: Lowella Grip III M.D.   On: 04/02/2017 10:07   Nm Hepato W/eject Fract  Result Date: 04/04/2017 CLINICAL DATA:  Nausea. EXAM: NUCLEAR MEDICINE HEPATOBILIARY IMAGING WITH GALLBLADDER EF TECHNIQUE: Sequential images of the abdomen were obtained out to 60 minutes following intravenous administration of radiopharmaceutical. After oral ingestion of Ensure, gallbladder ejection fraction was determined. At 60 min, normal ejection fraction is greater than 33%. RADIOPHARMACEUTICALS:  4.95 mCi Tc-51m Choletec IV COMPARISON:  04/03/2017 FINDINGS: Prompt uptake and biliary excretion of activity by the liver is seen. Gallbladder activity is visualized, consistent with patency of cystic duct. Biliary activity passes into small bowel, consistent with patent common bile duct. Calculated gallbladder ejection fraction is 21%. (Normal gallbladder ejection fraction with Ensure is greater than 33%.) IMPRESSION: 1. Patent cystic duct without evidence for acute cholecystitis. 2. Abnormally low gallbladder ejection fraction at 21 % which reflect biliary dyskinesis. Electronically Signed   By: TKerby MoorsM.D.   On: 04/04/2017 15:56   Dg Abd Acute W/chest  Result Date: 04/02/2017 CLINICAL DATA:  Upper abdominal pain. EXAM: DG ABDOMEN ACUTE W/ 1V CHEST COMPARISON:  CT scan of January 15, 2017. FINDINGS: There is no evidence of dilated bowel loops or free intraperitoneal air. Phleboliths are noted in the pelvis. Calculus is noted to the left of the L2-3 disc space which corresponds to ureteropelvic junction calculus seen on prior CT scan. Phleboliths are noted in the pelvis. Heart size and mediastinal contours are within normal limits. Both lungs are clear. IMPRESSION: No evidence of bowel obstruction or ileus.  Calculus is seen left of lumbar spine corresponding to ureteropelvic junction calculus noted on prior CT scan. No acute cardiopulmonary disease. Electronically Signed   By: JMarijo Conception M.D.   On: 04/02/2017 08:04   Ct Renal Stone Study  Result Date: 04/06/2017 CLINICAL DATA:  Pain. EXAM: CT ABDOMEN AND PELVIS WITHOUT CONTRAST TECHNIQUE: Multidetector CT imaging of the abdomen and pelvis was performed following the standard protocol without IV contrast. COMPARISON:  01/15/2017 and 10/11/2016 FINDINGS: Lower chest: Normal. Hepatobiliary: Focal fatty infiltration adjacent to the falciform ligament, unchanged. Liver parenchyma is otherwise normal. Normal biliary tree. Pancreas: Unremarkable. No pancreatic ductal dilatation or surrounding inflammatory changes. Spleen: Normal in size without focal abnormality. Adrenals/Urinary Tract: Nonobstructing 6 mm stone in the left renal pelvis. Tiny stones in the upper and lower poles of the left kidney. Previously demonstrated cyst in the upper pole of the left kidney is quite subtle on this exam. No ureteral or bladder calculi. Adrenal glands are normal. Stomach/Bowel: Stomach is within normal limits. Appendix appears normal. No evidence of bowel wall thickening, distention, or inflammatory changes. Vascular/Lymphatic: No significant vascular findings are present. No enlarged abdominal or pelvic lymph nodes. Reproductive: Uterus and ovaries are normal except for slight exophytic fibroid on the left side of the uterine  fundus, 2.3 cm. Cyst seen on the right ovary on the prior exam has resolved. Other: No abdominal wall hernia or abnormality. No abdominopelvic ascites. Musculoskeletal: No acute or significant osseous findings. IMPRESSION: Mobile 6 mm stone in the left renal pelvis. No obstructive findings on the current exam. Tiny stones in the upper and lower poles of the left kidney. Small fibroid in the uterine fundus. Otherwise, benign appearing abdomen. Electronically  Signed   By: Lorriane Shire M.D.   On: 04/06/2017 11:31   US Abdomen Limited Ruq  Result Date: 04/03/2017 CLINICAL DATA:  Right upper quadrant pain over the last week. EXAM: US ABDOMEN LIMITED - RIGHT UPPER QUADRANT COMPARISON:  MRI 04/02/2017.  Ultrasound 04/02/2017.  CT 01/15/2017. FINDINGS: Gallbladder: Mild gallbladder wall thickening. No shadowing stones. No Murphy sign. Tiny amount of fluid surrounding the gallbladder. Common bile duct: Diameter: 3 mm, normal Liver: No focal lesion identified. Within normal limits in parenchymal echogenicity. IMPRESSION: Abnormal study showing mild gallbladder wall thickening and fluid around the gallbladder. No evidence of stone or positive Murphy sign. No ductal dilatation. This examination is indeterminate. Consider nuclear medicine correlation. Electronically Signed   By: Nelson Chimes M.D.   On: 04/03/2017 10:47      Subjective: -Sleeping when I entered the room, appears comfortable, extremely upset as she just talk to her general surgery.  She is dressed and asking to leave right away  Discharge Exam: Vitals:   04/06/17 2022 04/07/17 0523  BP: 135/77 (!) 110/55  Pulse: 62 (!) 50  Resp: 17 17  Temp: 98.3 F (36.8 C) 98.1 F (36.7 C)   Vitals:   04/06/17 0522 04/06/17 1402 04/06/17 2022 04/07/17 0523  BP: 122/88 116/68 135/77 (!) 110/55  Pulse: 69 (!) 56 62 (!) 50  Resp: 16 16 17 17   Temp: 98.9 F (37.2 C) 98.6 F (37 C) 98.3 F (36.8 C) 98.1 F (36.7 C)  TempSrc: Oral Oral Oral Oral  SpO2: 96% 95% 99% 96%    General: Pt is alert, awake, not in acute distress Cardiovascular: RRR, S1/S2 +, no rubs, no gallops Respiratory: CTA bilaterally, no wheezing, no rhonchi Abdominal: Soft, NT, ND, bowel sounds +  The results of significant diagnostics from this hospitalization (including imaging, microbiology, ancillary and laboratory) are listed below for reference.     Microbiology: Recent Results (from the past 240 hour(s))  Culture,  Urine     Status: Abnormal   Collection Time: 04/02/17  4:15 AM  Result Value Ref Range Status   Specimen Description URINE, CLEAN CATCH  Final   Special Requests NONE  Final   Culture MULTIPLE SPECIES PRESENT, SUGGEST RECOLLECTION (A)  Final   Report Status 04/03/2017 FINAL  Final     Labs: BNP (last 3 results) No results for input(s): BNP in the last 8760 hours. Basic Metabolic Panel:  Recent Labs Lab 04/02/17 0543  04/02/17 1918 04/03/17 0611 04/04/17 1329 04/06/17 0208 04/07/17 0622  NA 141  < > 136 138 140 141 137  K 2.9*  < > 3.4* 4.0 3.6 3.5 3.2*  CL 101  < > 104 110 108 104 103  CO2 26  < > 26 24 21* 25 27  GLUCOSE 158*  < > 100* 85 98 117* 83  BUN 21*  < > 15 13 15 13 10   CREATININE 0.95  < > 0.66 0.74 0.85 0.98 0.87  CALCIUM 8.3*  < > 8.1* 8.1* 9.2 8.7* 8.1*  MG 1.9  --   --   --   --   --   --   < > =  values in this interval not displayed. Liver Function Tests:  Recent Labs Lab 04/02/17 0113 04/02/17 0543 04/03/17 0611 04/04/17 1329 04/06/17 0208  AST 33 23 30 32 19  ALT 21 19 28  44 31  ALKPHOS 66 48 41 62 55  BILITOT 0.8 0.7 0.4 0.8 0.5  PROT 8.2* 6.6 5.4* 6.9 6.4*  ALBUMIN 5.1* 3.9 3.0* 3.9 3.8    Recent Labs Lab 04/02/17 0113 04/06/17 0208  LIPASE 16 18   No results for input(s): AMMONIA in the last 168 hours. CBC:  Recent Labs Lab 04/02/17 0113 04/02/17 0543 04/03/17 0611 04/04/17 1329 04/06/17 0208 04/07/17 0622  WBC 19.3* 12.5* 8.4 13.7* 11.2* 6.5  NEUTROABS 16.1* 10.8*  --   --  8.7*  --   HGB 12.8 10.8* 10.2* 12.3 12.0 10.3*  HCT 37.7 33.4* 30.7* 37.0 36.1 31.0*  MCV 87.3 88.1 89.2 87.3 87.6 88.6  PLT 397 270 248 363 339 256   Cardiac Enzymes: No results for input(s): CKTOTAL, CKMB, CKMBINDEX, TROPONINI in the last 168 hours. BNP: Invalid input(s): POCBNP CBG:  Recent Labs Lab 04/03/17 1718 04/03/17 2128 04/06/17 1709 04/07/17 0021 04/07/17 0521  GLUCAP 94 77 77 98 85   D-Dimer No results for input(s): DDIMER  in the last 72 hours. Hgb A1c No results for input(s): HGBA1C in the last 72 hours. Lipid Profile No results for input(s): CHOL, HDL, LDLCALC, TRIG, CHOLHDL, LDLDIRECT in the last 72 hours. Thyroid function studies No results for input(s): TSH, T4TOTAL, T3FREE, THYROIDAB in the last 72 hours.  Invalid input(s): FREET3 Anemia work up No results for input(s): VITAMINB12, FOLATE, FERRITIN, TIBC, IRON, RETICCTPCT in the last 72 hours. Urinalysis    Component Value Date/Time   COLORURINE AMBER (A) 04/06/2017 0321   APPEARANCEUR HAZY (A) 04/06/2017 0321   LABSPEC 1.028 04/06/2017 0321   PHURINE 5.0 04/06/2017 0321   GLUCOSEU NEGATIVE 04/06/2017 0321   HGBUR MODERATE (A) 04/06/2017 0321   BILIRUBINUR SMALL (A) 04/06/2017 0321   KETONESUR 20 (A) 04/06/2017 0321   PROTEINUR 30 (A) 04/06/2017 0321   UROBILINOGEN 0.2 07/02/2015 1757   NITRITE NEGATIVE 04/06/2017 0321   LEUKOCYTESUR NEGATIVE 04/06/2017 0321   Sepsis Labs Invalid input(s): PROCALCITONIN,  WBC,  LACTICIDVEN Microbiology Recent Results (from the past 240 hour(s))  Culture, Urine     Status: Abnormal   Collection Time: 04/02/17  4:15 AM  Result Value Ref Range Status   Specimen Description URINE, CLEAN CATCH  Final   Special Requests NONE  Final   Culture MULTIPLE SPECIES PRESENT, SUGGEST RECOLLECTION (A)  Final   Report Status 04/03/2017 FINAL  Final     Time coordinating discharge: 35 minutes  SIGNED:  Marzetta Board, MD  [facility] 04/07/2017, 12:02 PM Pager [phone]  If 7PM-7AM, please contact night-coverage [email or web address] Password [email or web address]

## 2017-04-07 NOTE — Discharge Instructions (Signed)
Follow with Nolene Ebbs, MD in 5-7 days  Follow up with Dr. Oneida Alar in 1 week  Please get a complete blood count and chemistry panel checked by your Primary MD at your next visit, and again as instructed by your Primary MD. Please get your medications reviewed and adjusted by your Primary MD.  Please request your Primary MD to go over all Hospital Tests and Procedure/Radiological results at the follow up, please get all Hospital records sent to your Prim MD by signing hospital release before you go home.  If you had Pneumonia of Lung problems at the Hospital: Please get a 2 view Chest X ray done in 6-8 weeks after hospital discharge or sooner if instructed by your Primary MD.  If you have Congestive Heart Failure: Please call your Cardiologist or Primary MD anytime you have any of the following symptoms:  1) 3 pound weight gain in 24 hours or 5 pounds in 1 week  2) shortness of breath, with or without a dry hacking cough  3) swelling in the hands, feet or stomach  4) if you have to sleep on extra pillows at night in order to breathe  Follow cardiac low salt diet and 1.5 lit/day fluid restriction.  If you have diabetes Accuchecks 4 times/day, Once in AM empty stomach and then before each meal. Log in all results and show them to your primary doctor at your next visit. If any glucose reading is under 80 or above 300 call your primary MD immediately.  If you have Seizure/Convulsions/Epilepsy: Please do not drive, operate heavy machinery, participate in activities at heights or participate in high speed sports until you have seen by Primary MD or a Neurologist and advised to do so again.  If you had Gastrointestinal Bleeding: Please ask your Primary MD to check a complete blood count within one week of discharge or at your next visit. Your endoscopic/colonoscopic biopsies that are pending at the time of discharge, will also need to followed by your Primary MD.  Get Medicines reviewed and  adjusted. Please take all your medications with you for your next visit with your Primary MD  Please request your Primary MD to go over all hospital tests and procedure/radiological results at the follow up, please ask your Primary MD to get all Hospital records sent to his/her office.  If you experience worsening of your admission symptoms, develop shortness of breath, life threatening emergency, suicidal or homicidal thoughts you must seek medical attention immediately by calling 911 or calling your MD immediately  if symptoms less severe.  You must read complete instructions/literature along with all the possible adverse reactions/side effects for all the Medicines you take and that have been prescribed to you. Take any new Medicines after you have completely understood and accpet all the possible adverse reactions/side effects.   Do not drive or operate heavy machinery when taking Pain medications.   Do not take more than prescribed Pain, Sleep and Anxiety Medications  Special Instructions: If you have smoked or chewed Tobacco  in the last 2 yrs please stop smoking, stop any regular Alcohol  and or any Recreational drug use.  Wear Seat belts while driving.  Please note You were cared for by a hospitalist during your hospital stay. If you have any questions about your discharge medications or the care you received while you were in the hospital after you are discharged, you can call the unit and asked to speak with the hospitalist on call if the hospitalist  that took care of you is not available. Once you are discharged, your primary care physician will handle any further medical issues. Please note that NO REFILLS for any discharge medications will be authorized once you are discharged, as it is imperative that you return to your primary care physician (or establish a relationship with a primary care physician if you do not have one) for your aftercare needs so that they can reassess your need  for medications and monitor your lab values.  You can reach the hospitalist office at phone [phone] or fax [phone]   If you do not have a primary care physician, you can call [phone] for a physician referral.  Activity: As tolerated with Full fall precautions use walker/cane & assistance as needed  Diet: small frequent meals  Disposition Home

## 2017-04-28 ENCOUNTER — Encounter (HOSPITAL_COMMUNITY): Payer: Self-pay | Admitting: *Deleted

## 2017-04-28 ENCOUNTER — Inpatient Hospital Stay (HOSPITAL_COMMUNITY)
Admission: EM | Admit: 2017-04-28 | Discharge: 2017-04-30 | DRG: 641 | Disposition: A | Discharge status: Home / Self Care | Payer: Self-pay | Attending: Family Medicine | Admitting: Family Medicine

## 2017-04-28 DIAGNOSIS — F17210 Nicotine dependence, cigarettes, uncomplicated: Secondary | ICD-10-CM | POA: Diagnosis present

## 2017-04-28 DIAGNOSIS — E876 Hypokalemia: Principal | ICD-10-CM | POA: Diagnosis present

## 2017-04-28 DIAGNOSIS — M545 Low back pain: Secondary | ICD-10-CM | POA: Diagnosis present

## 2017-04-28 DIAGNOSIS — R109 Unspecified abdominal pain: Secondary | ICD-10-CM

## 2017-04-28 DIAGNOSIS — F1210 Cannabis abuse, uncomplicated: Secondary | ICD-10-CM | POA: Diagnosis present

## 2017-04-28 DIAGNOSIS — R1084 Generalized abdominal pain: Secondary | ICD-10-CM | POA: Diagnosis present

## 2017-04-28 DIAGNOSIS — G8929 Other chronic pain: Secondary | ICD-10-CM | POA: Diagnosis present

## 2017-04-28 DIAGNOSIS — Z885 Allergy status to narcotic agent status: Secondary | ICD-10-CM

## 2017-04-28 DIAGNOSIS — K210 Gastro-esophageal reflux disease with esophagitis: Secondary | ICD-10-CM | POA: Diagnosis present

## 2017-04-28 DIAGNOSIS — F329 Major depressive disorder, single episode, unspecified: Secondary | ICD-10-CM | POA: Diagnosis present

## 2017-04-28 DIAGNOSIS — F411 Generalized anxiety disorder: Secondary | ICD-10-CM | POA: Diagnosis present

## 2017-04-28 DIAGNOSIS — F12188 Cannabis abuse with other cannabis-induced disorder: Secondary | ICD-10-CM | POA: Diagnosis present

## 2017-04-28 DIAGNOSIS — R112 Nausea with vomiting, unspecified: Secondary | ICD-10-CM

## 2017-04-28 DIAGNOSIS — R1010 Upper abdominal pain, unspecified: Secondary | ICD-10-CM

## 2017-04-28 DIAGNOSIS — D649 Anemia, unspecified: Secondary | ICD-10-CM | POA: Diagnosis present

## 2017-04-28 DIAGNOSIS — F12988 Cannabis use, unspecified with other cannabis-induced disorder: Secondary | ICD-10-CM

## 2017-04-28 DIAGNOSIS — K219 Gastro-esophageal reflux disease without esophagitis: Secondary | ICD-10-CM | POA: Diagnosis present

## 2017-04-28 DIAGNOSIS — T50996A Underdosing of other drugs, medicaments and biological substances, initial encounter: Secondary | ICD-10-CM | POA: Diagnosis present

## 2017-04-28 DIAGNOSIS — Z91048 Other nonmedicinal substance allergy status: Secondary | ICD-10-CM

## 2017-04-28 DIAGNOSIS — T471X6A Underdosing of other antacids and anti-gastric-secretion drugs, initial encounter: Secondary | ICD-10-CM | POA: Diagnosis present

## 2017-04-28 DIAGNOSIS — E872 Acidosis, unspecified: Secondary | ICD-10-CM

## 2017-04-28 DIAGNOSIS — Z960 Presence of urogenital implants: Secondary | ICD-10-CM | POA: Diagnosis present

## 2017-04-28 DIAGNOSIS — Z79899 Other long term (current) drug therapy: Secondary | ICD-10-CM

## 2017-04-28 DIAGNOSIS — F1290 Cannabis use, unspecified, uncomplicated: Secondary | ICD-10-CM

## 2017-04-28 DIAGNOSIS — Z881 Allergy status to other antibiotic agents status: Secondary | ICD-10-CM

## 2017-04-28 DIAGNOSIS — R1013 Epigastric pain: Secondary | ICD-10-CM | POA: Diagnosis present

## 2017-04-28 DIAGNOSIS — K529 Noninfective gastroenteritis and colitis, unspecified: Secondary | ICD-10-CM | POA: Diagnosis present

## 2017-04-28 DIAGNOSIS — N200 Calculus of kidney: Secondary | ICD-10-CM | POA: Diagnosis present

## 2017-04-28 DIAGNOSIS — H409 Unspecified glaucoma: Secondary | ICD-10-CM | POA: Diagnosis present

## 2017-04-28 DIAGNOSIS — R21 Rash and other nonspecific skin eruption: Secondary | ICD-10-CM | POA: Diagnosis present

## 2017-04-28 DIAGNOSIS — F1910 Other psychoactive substance abuse, uncomplicated: Secondary | ICD-10-CM | POA: Diagnosis present

## 2017-04-28 NOTE — ED Triage Notes (Signed)
Pt c/o abd pain that started two days ago, reports that she normally takes a gi cocktail and protonix but has been out for the past week, pt was given 4 mg zofran prior to arrival in er by ems,

## 2017-04-29 ENCOUNTER — Observation Stay (HOSPITAL_COMMUNITY): Payer: Self-pay

## 2017-04-29 DIAGNOSIS — E876 Hypokalemia: Principal | ICD-10-CM

## 2017-04-29 DIAGNOSIS — D649 Anemia, unspecified: Secondary | ICD-10-CM

## 2017-04-29 DIAGNOSIS — F12988 Cannabis use, unspecified with other cannabis-induced disorder: Secondary | ICD-10-CM

## 2017-04-29 DIAGNOSIS — K529 Noninfective gastroenteritis and colitis, unspecified: Secondary | ICD-10-CM | POA: Diagnosis present

## 2017-04-29 DIAGNOSIS — K219 Gastro-esophageal reflux disease without esophagitis: Secondary | ICD-10-CM

## 2017-04-29 DIAGNOSIS — G8929 Other chronic pain: Secondary | ICD-10-CM

## 2017-04-29 DIAGNOSIS — R1013 Epigastric pain: Secondary | ICD-10-CM

## 2017-04-29 DIAGNOSIS — R109 Unspecified abdominal pain: Secondary | ICD-10-CM

## 2017-04-29 DIAGNOSIS — F1290 Cannabis use, unspecified, uncomplicated: Secondary | ICD-10-CM

## 2017-04-29 DIAGNOSIS — K210 Gastro-esophageal reflux disease with esophagitis: Secondary | ICD-10-CM

## 2017-04-29 DIAGNOSIS — R1084 Generalized abdominal pain: Secondary | ICD-10-CM

## 2017-04-29 LAB — COMPREHENSIVE METABOLIC PANEL
ALBUMIN: 3.9 g/dL (ref 3.5–5.0)
ALK PHOS: 50 U/L (ref 38–126)
ALT: 13 U/L — AB (ref 14–54)
AST: 22 U/L (ref 15–41)
Anion gap: 16 — ABNORMAL HIGH (ref 5–15)
BUN: 20 mg/dL (ref 6–20)
CALCIUM: 8.8 mg/dL — AB (ref 8.9–10.3)
CHLORIDE: 108 mmol/L (ref 101–111)
CO2: 17 mmol/L — AB (ref 22–32)
CREATININE: 1.05 mg/dL — AB (ref 0.44–1.00)
GFR calc Af Amer: >60 mL/min (ref >60)
GFR calc non Af Amer: >60 mL/min (ref >60)
GLUCOSE: 141 mg/dL — AB (ref 65–99)
Potassium: 2.8 mmol/L — ABNORMAL LOW (ref 3.5–5.1)
SODIUM: 141 mmol/L (ref 135–145)
Total Bilirubin: 0.8 mg/dL (ref 0.3–1.2)
Total Protein: 6.8 g/dL (ref 6.5–8.1)

## 2017-04-29 LAB — CBC
HEMATOCRIT: 37 % (ref 36.0–46.0)
Hemoglobin: 12 g/dL (ref 12.0–15.0)
MCH: 28 pg (ref 26.0–34.0)
MCHC: 32.4 g/dL (ref 30.0–36.0)
MCV: 86.4 fL (ref 78.0–100.0)
Platelets: 350 10*3/uL (ref 150–400)
RBC: 4.28 MIL/uL (ref 3.87–5.11)
RDW: 15.8 % — AB (ref 11.5–15.5)
WBC: 21.2 10*3/uL — ABNORMAL HIGH (ref 4.0–10.5)

## 2017-04-29 LAB — URINALYSIS, COMPLETE (UACMP) WITH MICROSCOPIC
GLUCOSE, UA: 50 mg/dL — AB
KETONES UR: 20 mg/dL — AB
Nitrite: NEGATIVE
PROTEIN: 100 mg/dL — AB
Specific Gravity, Urine: 1.033 — ABNORMAL HIGH (ref 1.005–1.030)
pH: 5 (ref 5.0–8.0)

## 2017-04-29 LAB — BASIC METABOLIC PANEL
Anion gap: 15 (ref 5–15)
BUN: 22 mg/dL — AB (ref 6–20)
CO2: 21 mmol/L — AB (ref 22–32)
Calcium: 9.1 mg/dL (ref 8.9–10.3)
Chloride: 102 mmol/L (ref 101–111)
Creatinine, Ser: 0.91 mg/dL (ref 0.44–1.00)
GFR calc Af Amer: >60 mL/min (ref >60)
GLUCOSE: 179 mg/dL — AB (ref 65–99)
Potassium: 3.2 mmol/L — ABNORMAL LOW (ref 3.5–5.1)
Sodium: 138 mmol/L (ref 135–145)

## 2017-04-29 LAB — CBC WITH DIFFERENTIAL/PLATELET
Basophils Absolute: 0.1 10*3/uL (ref 0.0–0.1)
Basophils Relative: 0 %
EOS ABS: 0 10*3/uL (ref 0.0–0.7)
EOS PCT: 0 %
HCT: 34.9 % — ABNORMAL LOW (ref 36.0–46.0)
Hemoglobin: 11.6 g/dL — ABNORMAL LOW (ref 12.0–15.0)
LYMPHS ABS: 2.6 10*3/uL (ref 0.7–4.0)
LYMPHS PCT: 16 %
MCH: 28.6 pg (ref 26.0–34.0)
MCHC: 33.2 g/dL (ref 30.0–36.0)
MCV: 86.2 fL (ref 78.0–100.0)
MONO ABS: 1.5 10*3/uL — AB (ref 0.1–1.0)
MONOS PCT: 9 %
Neutro Abs: 12.2 10*3/uL — ABNORMAL HIGH (ref 1.7–7.7)
Neutrophils Relative %: 75 %
PLATELETS: 311 10*3/uL (ref 150–400)
RBC: 4.05 MIL/uL (ref 3.87–5.11)
RDW: 15.8 % — ABNORMAL HIGH (ref 11.5–15.5)
WBC: 16.4 10*3/uL — ABNORMAL HIGH (ref 4.0–10.5)

## 2017-04-29 LAB — MAGNESIUM
MAGNESIUM: 1.5 mg/dL — AB (ref 1.7–2.4)
Magnesium: 2.5 mg/dL — ABNORMAL HIGH (ref 1.7–2.4)

## 2017-04-29 LAB — RAPID URINE DRUG SCREEN, HOSP PERFORMED
AMPHETAMINES: NOT DETECTED
BENZODIAZEPINES: POSITIVE — AB
Barbiturates: POSITIVE — AB
Cocaine: NOT DETECTED
Opiates: NOT DETECTED
Tetrahydrocannabinol: POSITIVE — AB

## 2017-04-29 LAB — I-STAT BETA HCG BLOOD, ED (MC, WL, AP ONLY): I-stat hCG, quantitative: <5 m[IU]/mL (ref <5)

## 2017-04-29 LAB — LIPASE, BLOOD: Lipase: 13 U/L (ref 11–51)

## 2017-04-29 LAB — LACTIC ACID, PLASMA: LACTIC ACID, VENOUS: 2 mmol/L — AB (ref 0.5–1.9)

## 2017-04-29 MED ORDER — METOCLOPRAMIDE HCL 5 MG/ML IJ SOLN
10.0000 mg | Freq: Once | INTRAMUSCULAR | Status: AC
  Administered 2017-04-29: 10 mg via INTRAVENOUS
  Filled 2017-04-29: qty 2

## 2017-04-29 MED ORDER — PANTOPRAZOLE SODIUM 40 MG PO TBEC
40.0000 mg | DELAYED_RELEASE_TABLET | Freq: Two times a day (BID) | ORAL | Status: DC
  Administered 2017-04-29 – 2017-04-30 (×3): 40 mg via ORAL
  Filled 2017-04-29 (×3): qty 1

## 2017-04-29 MED ORDER — KETOROLAC TROMETHAMINE 30 MG/ML IJ SOLN
15.0000 mg | Freq: Once | INTRAMUSCULAR | Status: AC
  Administered 2017-04-29: 15 mg via INTRAVENOUS
  Filled 2017-04-29: qty 1

## 2017-04-29 MED ORDER — GI COCKTAIL ~~LOC~~
30.0000 mL | Freq: Once | ORAL | Status: AC
  Administered 2017-04-29: 30 mL via ORAL
  Filled 2017-04-29: qty 30

## 2017-04-29 MED ORDER — PROMETHAZINE HCL 25 MG/ML IJ SOLN
25.0000 mg | Freq: Once | INTRAMUSCULAR | Status: AC
  Administered 2017-04-29: 25 mg via INTRAVENOUS
  Filled 2017-04-29: qty 1

## 2017-04-29 MED ORDER — ENOXAPARIN SODIUM 40 MG/0.4ML ~~LOC~~ SOLN
40.0000 mg | SUBCUTANEOUS | Status: DC
  Administered 2017-04-30: 40 mg via SUBCUTANEOUS
  Filled 2017-04-29: qty 0.4

## 2017-04-29 MED ORDER — CIPROFLOXACIN IN D5W 400 MG/200ML IV SOLN
400.0000 mg | Freq: Two times a day (BID) | INTRAVENOUS | Status: DC
  Administered 2017-04-29 (×2): 400 mg via INTRAVENOUS
  Filled 2017-04-29 (×3): qty 200

## 2017-04-29 MED ORDER — IOPAMIDOL (ISOVUE-300) INJECTION 61%
INTRAVENOUS | Status: AC
  Administered 2017-04-29: 09:00:00
  Filled 2017-04-29: qty 30

## 2017-04-29 MED ORDER — DIPHENHYDRAMINE HCL 50 MG/ML IJ SOLN
25.0000 mg | Freq: Once | INTRAMUSCULAR | Status: AC
  Administered 2017-04-29: 25 mg via INTRAVENOUS
  Filled 2017-04-29: qty 1

## 2017-04-29 MED ORDER — POTASSIUM CHLORIDE 10 MEQ/100ML IV SOLN
10.0000 meq | Freq: Once | INTRAVENOUS | Status: AC
  Administered 2017-04-29: 10 meq via INTRAVENOUS
  Filled 2017-04-29: qty 100

## 2017-04-29 MED ORDER — TRAMADOL HCL 50 MG PO TABS
50.0000 mg | ORAL_TABLET | Freq: Four times a day (QID) | ORAL | Status: DC | PRN
  Administered 2017-04-29 (×2): 50 mg via ORAL
  Filled 2017-04-29 (×2): qty 1

## 2017-04-29 MED ORDER — SODIUM CHLORIDE 0.9 % IV BOLUS (SEPSIS)
1000.0000 mL | Freq: Once | INTRAVENOUS | Status: AC
  Administered 2017-04-29: 1000 mL via INTRAVENOUS

## 2017-04-29 MED ORDER — KETOROLAC TROMETHAMINE 30 MG/ML IJ SOLN: 30.0000 mg | Freq: Four times a day (QID) | INTRAMUSCULAR | Status: DC | PRN

## 2017-04-29 MED ORDER — POTASSIUM CHLORIDE IN NACL 40-0.9 MEQ/L-% IV SOLN
INTRAVENOUS | Status: AC
  Administered 2017-04-29: 100 mL/h via INTRAVENOUS

## 2017-04-29 MED ORDER — METRONIDAZOLE IN NACL 5-0.79 MG/ML-% IV SOLN
500.0000 mg | Freq: Three times a day (TID) | INTRAVENOUS | Status: DC
  Administered 2017-04-29 (×2): 500 mg via INTRAVENOUS
  Filled 2017-04-29 (×2): qty 100

## 2017-04-29 MED ORDER — IOPAMIDOL (ISOVUE-300) INJECTION 61%
100.0000 mL | Freq: Once | INTRAVENOUS | Status: AC | PRN
  Administered 2017-04-29: 100 mL via INTRAVENOUS

## 2017-04-29 MED ORDER — ONDANSETRON HCL 4 MG/2ML IJ SOLN: 4.0000 mg | Freq: Four times a day (QID) | INTRAMUSCULAR | Status: DC | PRN

## 2017-04-29 MED ORDER — ENSURE ENLIVE PO LIQD: 237.0000 mL | Freq: Two times a day (BID) | ORAL | Status: DC

## 2017-04-29 MED ORDER — PANTOPRAZOLE SODIUM 40 MG IV SOLR
40.0000 mg | Freq: Once | INTRAVENOUS | Status: AC
  Administered 2017-04-29: 40 mg via INTRAVENOUS
  Filled 2017-04-29: qty 40

## 2017-04-29 MED ORDER — ONDANSETRON HCL 4 MG PO TABS: 4.0000 mg | ORAL_TABLET | Freq: Four times a day (QID) | ORAL | Status: DC | PRN

## 2017-04-29 MED ORDER — GI COCKTAIL ~~LOC~~: 30.0000 mL | Freq: Three times a day (TID) | ORAL | Status: DC | PRN

## 2017-04-29 MED ORDER — MAGNESIUM SULFATE 2 GM/50ML IV SOLN
2.0000 g | Freq: Once | INTRAVENOUS | Status: AC
  Administered 2017-04-29: 2 g via INTRAVENOUS
  Filled 2017-04-29: qty 50

## 2017-04-29 NOTE — Progress Notes (Signed)
PROGRESS NOTE  Jeanne Mcneil CLE:[id number] DOB: 1986-09-06 DOA: 04/28/2017 PCP: Nolene Ebbs, MD  Brief History:  31 year old female with a history of chronic abdominal pain, nephrolithiasis, depression/anxiety, GERD, and uterine fibroids presents with 1-2 day history of worsening abdominal pain and associated nausea and vomiting.  The patient states that she ran out of her pantoprazole and GI cocktail approximately one week ago which usually gives her some relief. She complains of some subjective fevers, chills without any headache, chest pain, shortness breath, cough, hemoptysis. She denies any hematemesis, diarrhea, medication, melena. The patient has had numerous hospital admissions for similar presentations. She had EGD on 05/17/2017 performed by Dr. Barney Drain which showed grade a reflux esophagitis, mild gastritis, and a medium hiatus hernia. The patient had a HIDA scan on 04/04/2018 which showed a patent cystic duct without evidence of acute cholecystitis and EF of 21%. She has been evaluated by surgery on her 2 previous admissions who did not feel that she needed a cholecystectomy on an emergent basis. The patient states that she has been taking some over-the-counter NSAIDs as well as alprazolam "off the street". The patient states that she continues to smoke and use marijuana but denies any alcohol or other illegal drugs. In the emergency room, the patient was noted to have normal lipase and LFTs, but WBC was 16.4. The patient was admitted for further evaluation.  Assessment/Plan: Intractable vomiting and abdominal pain -Nursing staff has reported that patient has been trying to self-induce vomiting and trying to "gag" herself -The patient has a component of cannabis induced hyperemesis syndrome -Suspect the patient may have a degree of underlying gastroparesis -Patient's emotional overlay has compromised physical examination and further history -Continue clear liquid  diet -CT abdomen and pelvis -UA and urine culture -Continue IV fluids -Minimize opioids -HCG--negative  Leukocytosis -Likely stress demargination -The patient is afebrile and hemodynamically stable -Remain off antibiotics -Check lactic acid -UA and urine culture  GERD -Restart Protonix  -Restart GI cocktail when necessary  Hypomagnesemia/hypokalemia -Repleted -recheck in am    Disposition Plan:   Home in 1-2 days  Family Communication:  No Family at bedside--Total time spent 35 minutes.  Greater than 50% spent face to face counseling and coordinating care. 0805 to 0840   Consultants:  none  Code Status:  FULL  DVT Prophylaxis:  [place] Heparin / [place] Lovenox   Procedures: As Listed in Progress Note Above  Antibiotics: None    Subjective: Jeanne Mcneil continues to complain of vomiting "too many times".  C/o abdominal pain, no better than yesterday.  Denies dysuria but complains, "I am not peeing as much".  Denies cp, sob, cough, diarrhea, hematochezia, melena, hematemesis.    Objective: Vitals:   04/29/17 0158 04/29/17 0300 04/29/17 0343 04/29/17 0600  BP: (!) 143/84 (!) 153/91 (!) 153/91 (!) 157/82  Pulse: 65  72 68  Resp: 18  18 18   Temp: 98.8 F (37.1 C)  97.7 F (36.5 C) 98.4 F (36.9 C)  TempSrc: Oral  Oral Oral  SpO2: 99%  98% 100%  Weight:    57 kg (125 lb 11.2 oz)  Height:    5' 2"  (1.575 m)    Intake/Output Summary (Last 24 hours) at 04/29/17 0824 Last data filed at 04/29/17 0500  Gross per 24 hour  Intake             1250 ml  Output  0 ml  Net             1250 ml   Weight change:  Exam:   General:  Jeanne Mcneil is alert, follows commands appropriately, not in acute distress  HEENT: No icterus, No thrush, No neck mass, [place]/AT  Cardiovascular: RRR, S1/S2, no rubs, no gallops  Respiratory: CTA bilaterally, no wheezing, no crackles, no rhonchi  Abdomen: Soft/+BS, diffusely tender to feather touch, non distended, no guarding  Extremities: No  edema, No lymphangitis, No petechiae, No rashes, no synovitis   Data Reviewed: I have personally reviewed following labs and imaging studies Basic Metabolic Panel:  Recent Labs Lab 04/29/17 0058 04/29/17 0542  NA 141 138  K 2.8* 3.2*  CL 108 102  CO2 17* 21*  GLUCOSE 141* 179*  BUN 20 22*  CREATININE 1.05* 0.91  CALCIUM 8.8* 9.1  MG 1.5* 2.5*   Liver Function Tests:  Recent Labs Lab 04/29/17 0058  AST 22  ALT 13*  ALKPHOS 50  BILITOT 0.8  PROT 6.8  ALBUMIN 3.9    Recent Labs Lab 04/29/17 0058  LIPASE 13   No results for input(s): AMMONIA in the last 168 hours. Coagulation Profile: No results for input(s): INR, PROTIME in the last 168 hours. CBC:  Recent Labs Lab 04/29/17 0058 04/29/17 0542  WBC 16.4* 21.2*  NEUTROABS 12.2*  --   HGB 11.6* 12.0  HCT 34.9* 37.0  MCV 86.2 86.4  PLT 311 350   Cardiac Enzymes: No results for input(s): CKTOTAL, CKMB, CKMBINDEX, TROPONINI in the last 168 hours. BNP: Invalid input(s): POCBNP CBG: No results for input(s): GLUCAP in the last 168 hours. HbA1C: No results for input(s): HGBA1C in the last 72 hours. Urine analysis:    Component Value Date/Time   COLORURINE AMBER (A) 04/06/2017 0321   APPEARANCEUR HAZY (A) 04/06/2017 0321   LABSPEC 1.028 04/06/2017 0321   PHURINE 5.0 04/06/2017 0321   GLUCOSEU NEGATIVE 04/06/2017 0321   HGBUR MODERATE (A) 04/06/2017 0321   BILIRUBINUR SMALL (A) 04/06/2017 0321   KETONESUR 20 (A) 04/06/2017 0321   PROTEINUR 30 (A) 04/06/2017 0321   UROBILINOGEN 0.2 07/02/2015 1757   NITRITE NEGATIVE 04/06/2017 0321   LEUKOCYTESUR NEGATIVE 04/06/2017 0321   Sepsis Labs: @LABRCNTIP (procalcitonin:4,lacticidven:4) )No results found for this or any previous visit (from the past 240 hour(s)).   Scheduled Meds: . feeding supplement (ENSURE ENLIVE)  237 mL Oral BID BM  . pantoprazole  40 mg Oral BID   Continuous Infusions: . 0.9 % NaCl with KCl 40 mEq / L 100 mL/hr (04/29/17 0538)     Procedures/Studies: Mr Abdomen W Wo Contrast  Result Date: 04/02/2017 CLINICAL DATA:  Abdominal pain, body aches. EXAM: MRI ABDOMEN WITHOUT AND WITH CONTRAST TECHNIQUE: Multiplanar multisequence MR imaging of the abdomen was performed both before and after the administration of intravenous contrast. CONTRAST:  61m MULTIHANCE GADOBENATE DIMEGLUMINE 529 MG/ML IV SOLN COMPARISON:  None. FINDINGS: Lower chest:  Lung bases are clear. Hepatobiliary: No focal hepatic lesion. No no duct dilatation. Small amount pericholecystic fluid. No gallstones evident. Pancreas: Normal pancreatic parenchymal intensity. No ductal dilatation or inflammation. Spleen: Normal spleen. Adrenals/urinary tract: Adrenal glands normal. Round nonenhancing lesion in the upper pole of the LEFT kidney measures 11 mm (image 13, series 903). This lesion is hyperintense on T2 weighted imaging and corresponds to the indeterminate lesion on comparison ultrasound. No additional renal lesions. Stomach/Bowel: Stomach and limited of the small bowel is unremarkable Vascular/Lymphatic: Abdominal aortic normal caliber. No retroperitoneal periportal lymphadenopathy.  Musculoskeletal: No aggressive osseous lesion IMPRESSION: 1. Benign Bosniak 1 renal cysts the upper pole of the LEFT kidney corresponds to the indeterminate lesion on comparison ultrasound. 2. Small amount pericholecystic fluid.  No gallstones evident. Electronically Signed   By: Suzy Bouchard M.D.   On: 04/02/2017 16:27   US Renal  Result Date: 04/02/2017 CLINICAL DATA:  Nephrolithiasis EXAM: RENAL / URINARY TRACT ULTRASOUND COMPLETE COMPARISON:  CT abdomen and pelvis January 15, 2017 FINDINGS: Right Kidney: Length: 12.6 cm. Echogenicity and renal cortical thickness are within normal limits. No mass, perinephric fluid, or hydronephrosis visualized. No sonographically demonstrable calculus or ureterectasis. Left Kidney: Length: 11.6 cm. Echogenicity and renal cortical thickness are  within normal limits. No perinephric fluid or hydronephrosis visualized. There is a complex mass in the medial upper pole left kidney which contains multiple thickened septations measuring 1.3 x 1.6 x 1.3 cm. No sonographically demonstrable calculus or ureterectasis. Bladder: Appears normal for degree of bladder distention. IMPRESSION: Somewhat complex appearing mass in the medial upper pole left kidney measuring 1.3 x 1.6 x 1.3 cm. Further evaluation with pre and post contrast MRI or CT should be considered. MRI is preferred in younger patients (due to lack of ionizing radiation) and for evaluating calcified lesion(s). No calculi evident.  Study otherwise unremarkable. Electronically Signed   By: Lowella Grip III M.D.   On: 04/02/2017 10:07   Nm Hepato W/eject Fract  Result Date: 04/04/2017 CLINICAL DATA:  Nausea. EXAM: NUCLEAR MEDICINE HEPATOBILIARY IMAGING WITH GALLBLADDER EF TECHNIQUE: Sequential images of the abdomen were obtained out to 60 minutes following intravenous administration of radiopharmaceutical. After oral ingestion of Ensure, gallbladder ejection fraction was determined. At 60 min, normal ejection fraction is greater than 33%. RADIOPHARMACEUTICALS:  4.95 mCi Tc-17m Choletec IV COMPARISON:  04/03/2017 FINDINGS: Prompt uptake and biliary excretion of activity by the liver is seen. Gallbladder activity is visualized, consistent with patency of cystic duct. Biliary activity passes into small bowel, consistent with patent common bile duct. Calculated gallbladder ejection fraction is 21%. (Normal gallbladder ejection fraction with Ensure is greater than 33%.) IMPRESSION: 1. Patent cystic duct without evidence for acute cholecystitis. 2. Abnormally low gallbladder ejection fraction at 21 % which reflect biliary dyskinesis. Electronically Signed   By: TKerby MoorsM.D.   On: 04/04/2017 15:56   Dg Abd Acute W/chest  Result Date: 04/02/2017 CLINICAL DATA:  Upper abdominal pain. EXAM: DG  ABDOMEN ACUTE W/ 1V CHEST COMPARISON:  CT scan of January 15, 2017. FINDINGS: There is no evidence of dilated bowel loops or free intraperitoneal air. Phleboliths are noted in the pelvis. Calculus is noted to the left of the L2-3 disc space which corresponds to ureteropelvic junction calculus seen on prior CT scan. Phleboliths are noted in the pelvis. Heart size and mediastinal contours are within normal limits. Both lungs are clear. IMPRESSION: No evidence of bowel obstruction or ileus. Calculus is seen left of lumbar spine corresponding to ureteropelvic junction calculus noted on prior CT scan. No acute cardiopulmonary disease. Electronically Signed   By: JMarijo Conception M.D.   On: 04/02/2017 08:04   Ct Renal Stone Study  Result Date: 04/06/2017 CLINICAL DATA:  Pain. EXAM: CT ABDOMEN AND PELVIS WITHOUT CONTRAST TECHNIQUE: Multidetector CT imaging of the abdomen and pelvis was performed following the standard protocol without IV contrast. COMPARISON:  01/15/2017 and 10/11/2016 FINDINGS: Lower chest: Normal. Hepatobiliary: Focal fatty infiltration adjacent to the falciform ligament, unchanged. Liver parenchyma is otherwise normal. Normal biliary tree. Pancreas: Unremarkable. No pancreatic  ductal dilatation or surrounding inflammatory changes. Spleen: Normal in size without focal abnormality. Adrenals/Urinary Tract: Nonobstructing 6 mm stone in the left renal pelvis. Tiny stones in the upper and lower poles of the left kidney. Previously demonstrated cyst in the upper pole of the left kidney is quite subtle on this exam. No ureteral or bladder calculi. Adrenal glands are normal. Stomach/Bowel: Stomach is within normal limits. Appendix appears normal. No evidence of bowel wall thickening, distention, or inflammatory changes. Vascular/Lymphatic: No significant vascular findings are present. No enlarged abdominal or pelvic lymph nodes. Reproductive: Uterus and ovaries are normal except for slight exophytic fibroid  on the left side of the uterine fundus, 2.3 cm. Cyst seen on the right ovary on the prior exam has resolved. Other: No abdominal wall hernia or abnormality. No abdominopelvic ascites. Musculoskeletal: No acute or significant osseous findings. IMPRESSION: Mobile 6 mm stone in the left renal pelvis. No obstructive findings on the current exam. Tiny stones in the upper and lower poles of the left kidney. Small fibroid in the uterine fundus. Otherwise, benign appearing abdomen. Electronically Signed   By: Lorriane Shire M.D.   On: 04/06/2017 11:31   US Abdomen Limited Ruq  Result Date: 04/03/2017 CLINICAL DATA:  Right upper quadrant pain over the last week. EXAM: US ABDOMEN LIMITED - RIGHT UPPER QUADRANT COMPARISON:  MRI 04/02/2017.  Ultrasound 04/02/2017.  CT 01/15/2017. FINDINGS: Gallbladder: Mild gallbladder wall thickening. No shadowing stones. No Murphy sign. Tiny amount of fluid surrounding the gallbladder. Common bile duct: Diameter: 3 mm, normal Liver: No focal lesion identified. Within normal limits in parenchymal echogenicity. IMPRESSION: Abnormal study showing mild gallbladder wall thickening and fluid around the gallbladder. No evidence of stone or positive Murphy sign. No ductal dilatation. This examination is indeterminate. Consider nuclear medicine correlation. Electronically Signed   By: Nelson Chimes M.D.   On: 04/03/2017 10:47    Cashius Grandstaff, DO  [facility] Pager [phone]  If 7PM-7AM, please contact night-coverage [email or web address] Password [email or web address] 04/29/2017, 8:24 AM   LOS: 0 days

## 2017-04-29 NOTE — H&P (Addendum)
History and Physical    Jeanne Mcneil HWT:[id number] DOB: 1986-01-26 DOA: 04/28/2017  PCP: Nolene Ebbs, MD  Patient coming from: home  Chief Complaint:  Nausea and vomiting and abdominal pain  HPI: Jeanne Mcneil is a 31 y.o. female with medical history significant of chronic abdominal pain for over a year, GERD, gastritis, kidney stones comes in with 2 days of worsening epigastric pain with vomiting.  She says she ran out of her protonix and gi cocktail about a week ago and this usually relieves her pain.  She reports no fever.  No blood in vomit.  No diarrhea.  No flank pain.  No urinary symptoms.  She reports that taking a hot bath really helps.  She admits to using xanax off the street but she has only done this twice in her life for her anxiety.  She denies using any other drugs specifically asked about meth and heroin, she denies this.  She has had her gallbladder evaluated and had an abnormal HIDA scan, was evaluated by general surgery and did not believe she needed a cholecytectomy.  She has been hospitalized several times in the last year, many times leaving AMA.  She has received zofran, toradol, phenergan, reglan in the ED and these have not helped.  Her k level was noted low at 2.8 and she was referred for admission for her intractable n/v and hypokalemia.  She is requesting at this time for me to watch her take a hot bath, which I have declined.  We will get her hot blankets and a kpad.  She is getting iv potassium and mag replacement at this time.  She appears as if she is withdrawing from something possibly.   Review of Systems: As per HPI otherwise 10 point review of systems negative.   Past Medical History:  Diagnosis Date  . Anxiety   . Back pain   . Constipation   . Depression   . GERD (gastroesophageal reflux disease)   . Glaucoma of both eyes   . Hematuria   . History of abnormal cervical Pap smear   . History of chronic gastritis   . History of kidney stones   .  Hydronephrosis, right   . Renal calculi    bilateral per ct 0902-2017  . Right ureteral stone   . Urgency of urination   . Uterine fibroid   . Uterine polyp     Past Surgical History:  Procedure Laterality Date  . CYSTOSCOPY W/ URETERAL STENT PLACEMENT Right 07/04/2016   Procedure: CYSTOSCOPY WITH RIGHT RETROGRADE PYELOGRAM, RIGHT URETERAL STENT PLACEMENT;  Surgeon: Ardis Hughs, MD;  Location: [facility];  Service: Urology;  Laterality: Right;  . CYSTOSCOPY WITH RETROGRADE PYELOGRAM, URETEROSCOPY AND STENT PLACEMENT Left 11/28/2007  . CYSTOSCOPY WITH RETROGRADE PYELOGRAM, URETEROSCOPY AND STENT PLACEMENT Right 08/02/2016   Procedure: CYSTOSCOPY WITH RIGHT  RETROGRADE PYELOGRAM, URETEROSCOPY,  AND STENT EXCHANGE;  Surgeon: Ardis Hughs, MD;  Location: [facility];  Service: Urology;  Laterality: Right;  . ESOPHAGOGASTRODUODENOSCOPY N/A 05/17/2016   Procedure: ESOPHAGOGASTRODUODENOSCOPY (EGD);  Surgeon: Danie Binder, MD;  Location: [facility];  Service: Endoscopy;  Laterality: N/A;  830   . INDUCED ABORTION  2009     reports that she has been smoking Cigarettes.  She has a 10.00 pack-year smoking history. She has never used smokeless tobacco. She reports that she drinks about 2.4 oz of alcohol per week . She reports that she does not use drugs.  Allergies  Allergen Reactions  . Keflex [Cephalexin] Other (See Comments)    Reaction:  Bladder spasms  . Codeine Hives and Itching  . Tape Other (See Comments)    Reaction:  Burning   . Vicodin [Hydrocodone-Acetaminophen] Hives and Itching    Family History  Problem Relation Age of Onset  . Colon cancer Paternal Uncle        Passed away Feb 12, 2015  . Nephrolithiasis Father   . Hypertension Father   . Nephrolithiasis Sister   . Nephrolithiasis Sister   . Nephrolithiasis Sister   . Hypertension Mother     Prior to Admission medications   Medication Sig Start Date End Date Taking? Authorizing Provider  Alum &  Mag Hydroxide-Simeth (GI COCKTAIL) SUSP suspension Take 30 mLs by mouth 3 (three) times daily as needed for indigestion. Shake well. 04/06/17  Yes Gherghe, Vella Redhead, MD  ketorolac (TORADOL) 10 MG tablet Take 1 tablet (10 mg total) by mouth every 6 (six) hours as needed. 04/06/17  Yes Gherghe, Vella Redhead, MD  pantoprazole (PROTONIX) 40 MG tablet Take 1 tablet (40 mg total) by mouth 2 (two) times daily. 01/18/17  Yes Nita Sells, MD  promethazine (PHENERGAN) 25 MG tablet Take 1 tablet (25 mg total) by mouth every 6 (six) hours as needed for nausea. 04/06/17  Yes Caren Griffins, MD  ranitidine (ZANTAC) 150 MG tablet Take 1 tablet (150 mg total) by mouth 2 (two) times daily. 04/06/17  Yes Caren Griffins, MD    Physical Exam: Vitals:   04/29/17 0100 04/29/17 0158 04/29/17 0300 04/29/17 0343  BP: (!) 183/88 (!) 143/84 (!) 153/91 (!) 153/91  Pulse: 76 65  72  Resp:  18  18  Temp:  98.8 F (37.1 C)  97.7 F (36.5 C)  TempSrc:  Oral  Oral  SpO2: 100% 99%  98%  Weight:      Height:        Constitutional: NAD, calm, comfortable Vitals:   04/29/17 0100 04/29/17 0158 04/29/17 0300 04/29/17 0343  BP: (!) 183/88 (!) 143/84 (!) 153/91 (!) 153/91  Pulse: 76 65  72  Resp:  18  18  Temp:  98.8 F (37.1 C)  97.7 F (36.5 C)  TempSrc:  Oral  Oral  SpO2: 100% 99%  98%  Weight:      Height:       Eyes: PERRL, lids and conjunctivae normal ENMT: Mucous membranes are moist. Posterior pharynx clear of any exudate or lesions.Normal dentition.  Neck: normal, supple, no masses, no thyromegaly Respiratory: clear to auscultation bilaterally, no wheezing, no crackles. Normal respiratory effort. No accessory muscle use.  Cardiovascular: Regular rate and rhythm, no murmurs / rubs / gallops. No extremity edema. 2+ pedal pulses. No carotid bruits.  Abdomen: diffuse tenderness, no masses palpated. No hepatosplenomegaly. Bowel sounds positive. No r/g.   Musculoskeletal: no clubbing / cyanosis. No joint  deformity upper and lower extremities. Good ROM, no contractures. Normal muscle tone.  Skin: no rashes, lesions, ulcers. No induration Neurologic: CN 2-12 grossly intact. Sensation intact, DTR normal. Strength 5/5 in all 4.  Psychiatric: Normal judgment and insight. Alert and oriented x 3. Normal mood.    Labs on Admission: I have personally reviewed following labs and imaging studies  CBC:  Recent Labs Lab 04/29/17 0058  WBC 16.4*  NEUTROABS 12.2*  HGB 11.6*  HCT 34.9*  MCV 86.2  PLT 867   Basic Metabolic Panel:  Recent Labs Lab 04/29/17 0058  NA 141  K 2.8*  CL 108  CO2 17*  GLUCOSE 141*  BUN 20  CREATININE 1.05*  CALCIUM 8.8*  MG 1.5*   GFR: Estimated Creatinine Clearance: 60.3 mL/min (A) (by C-G formula based on SCr of 1.05 mg/dL (H)). Liver Function Tests:  Recent Labs Lab 04/29/17 0058  AST 22  ALT 13*  ALKPHOS 50  BILITOT 0.8  PROT 6.8  ALBUMIN 3.9    Recent Labs Lab 04/29/17 0058  LIPASE 13    Radiological Exams on Admission: No results found.   Assessment/Plan 31 yo female with acute on chronic abdominal pain/n/v with hypokalemia and hypomagnesemia  Principal Problem:   Nausea and vomiting- intractable.  Pt reports she has seen dr fields in past and told she has GERD cannot find this note.  She was evaluated by general surg last month for her abnormal HIDA and was recommended to get gastric emptying study and further work up by GI prior to considering lap chole.  lfts and lipase are normal today.  abd exam is benign.  The fact that her symptoms have returned after running out of her protonix and gi cocktail points more to GERD/gastritis rather than GB disease.  Place on gi cocktail, protonix, zofran, toradol, ivf.  No blood in vomit reported.  Obtain GI consult with dr fields.  uds is positive for THC, barbituates and benzos.  Avoid controlled substances.  Active Problems:   Abdominal pain, chronic, epigastric- noted, as above provide k pad  to abdomen and warm blankets   GERD (gastroesophageal reflux disease)- protonix   Hypokalemia- replete thru iv   Generalized anxiety disorder- noted, misusing xanax as outpatient   Hypomagnesemia- mag sulfate 2 grams iv once, replete potassium     DVT prophylaxis: scds Code Status: full Family Communication: none Disposition Plan: per day team Consults called:  none Admission status:  observation   Balin Vandegrift A MD [facility]  If 7PM-7AM, please contact night-coverage [email or web address] Password [email or web address]  04/29/2017, 4:28 AM   Called by Rosary Lively RN to report patient is sticking her finger down her throat to force herself to vomit.  Please see Tish notes about details.  Reviewed previous d/c summary it was also noted she was doing the same during previous hospitalizations.  Unclear why patient is doing this.

## 2017-04-29 NOTE — Progress Notes (Signed)
Patient instructed numerous times as to the fact that she can not take a shower. Patient was informed by Dr. Shanon Brow of this as well.  Patient had fall policy, fall bracelet and yellow socks explained to her.  Also explained several times why the door to the room must stay open due to her being a fall risk.  Patient continues to want to sit in floor in front of toilet and gag herself trying to make herself vomit. Patient extremely argumentative with staff.

## 2017-04-29 NOTE — Progress Notes (Signed)
Pt requested to go into the bathroom.  She then sat in front of toilet and put her finger down her throat gagging herself.  When I asked why she was doing that she states" I just need to throw up".  Pt did this for about 3 minutes with no emesis due to her efforts she then returned to bed.   Dr. Shanon Brow notified via phone of the above.  Nursing staff to continue to monitor

## 2017-04-29 NOTE — ED Provider Notes (Signed)
[place] DEPT Provider Note   CSN: [id number] Arrival date & time: 04/28/17  2337     History   Chief Complaint Chief Complaint  Patient presents with  . Abdominal Pain    HPI Jeanne Mcneil is a 31 y.o. female.  The history is provided by the patient.  Abdominal Pain    She has a history of GERD, renal calculi, chronic abdominal pain. For the last 2 days, she has had some sharp, severe epigastric pain with some radiation to the back and chest. She rates pain at 10/10. There is associated nausea and vomiting. There has been some blood streaks in her emesis, but no frank hematemesis. She states she had run out of her pantoprazole and GI cocktail, and that these usually give her relief. She has not been able to hold anything down for the last 2 days. She has noted that she started breaking out in a rash today. Rash it consists of red raised areas which are not pruritic or painful.  Past Medical History:  Diagnosis Date  . Anxiety   . Back pain   . Constipation   . Depression   . GERD (gastroesophageal reflux disease)   . Glaucoma of both eyes   . Hematuria   . History of abnormal cervical Pap smear   . History of chronic gastritis   . History of kidney stones   . Hydronephrosis, right   . Renal calculi    bilateral per ct 0902-2017  . Right ureteral stone   . Urgency of urination   . Uterine fibroid   . Uterine polyp     Patient Active Problem List   Diagnosis Date Noted  . Nausea & vomiting 04/02/2017  . Acute renal failure ([place]) 01/16/2017  . Hyponatremia 01/16/2017  . Leukocytosis 01/16/2017  . Nausea and vomiting 01/15/2017  . Chest pain 01/15/2017  . Pyelonephritis 10/11/2016  . Hypokalemia 10/11/2016  . Hyperglycemia 10/11/2016  . Elevated blood pressure reading 10/11/2016  . Left nephrolithiasis 10/11/2016  . Renal colic on left side 91/91/6606  . Generalized anxiety disorder 10/11/2016  . Nausea with vomiting 07/26/2016  . Ureteral stone  07/04/2016  . UTI (urinary tract infection) 07/04/2016  . Hydronephrosis of right kidney 07/04/2016  . GERD (gastroesophageal reflux disease) 07/04/2016  . Ureteral calculi 07/04/2016  . Abdominal pain, chronic, epigastric   . Hematemesis 05/16/2016  . Heme + stool 05/16/2016  . Abdominal pain, epigastric 05/16/2016    Past Surgical History:  Procedure Laterality Date  . CYSTOSCOPY W/ URETERAL STENT PLACEMENT Right 07/04/2016   Procedure: CYSTOSCOPY WITH RIGHT RETROGRADE PYELOGRAM, RIGHT URETERAL STENT PLACEMENT;  Surgeon: Ardis Hughs, MD;  Location: [facility];  Service: Urology;  Laterality: Right;  . CYSTOSCOPY WITH RETROGRADE PYELOGRAM, URETEROSCOPY AND STENT PLACEMENT Left 11/28/2007  . CYSTOSCOPY WITH RETROGRADE PYELOGRAM, URETEROSCOPY AND STENT PLACEMENT Right 08/02/2016   Procedure: CYSTOSCOPY WITH RIGHT  RETROGRADE PYELOGRAM, URETEROSCOPY,  AND STENT EXCHANGE;  Surgeon: Ardis Hughs, MD;  Location[facility];  Service: Urology;  Laterality: Right;  . ESOPHAGOGASTRODUODENOSCOPY N/A 05/17/2016   Procedure: ESOPHAGOGASTRODUODENOSCOPY (EGD);  Surgeon: Danie Binder, MD;  Location: [facility];  Service: Endoscopy;  Laterality: N/A;  830   . INDUCED ABORTION  2009    OB History    Gravida Para Term Preterm AB Living   3 2 2   1 2    SAB TAB Ectopic Multiple Live Births   1       2  Home Medications    Prior to Admission medications   Medication Sig Start Date End Date Taking? Authorizing Provider  Alum & Mag Hydroxide-Simeth (GI COCKTAIL) SUSP suspension Take 30 mLs by mouth 3 (three) times daily as needed for indigestion. Shake well. 04/06/17  Yes Gherghe, Vella Redhead, MD  ketorolac (TORADOL) 10 MG tablet Take 1 tablet (10 mg total) by mouth every 6 (six) hours as needed. 04/06/17  Yes Gherghe, Vella Redhead, MD  pantoprazole (PROTONIX) 40 MG tablet Take 1 tablet (40 mg total) by mouth 2 (two) times daily. 01/18/17  Yes Nita Sells, MD    promethazine (PHENERGAN) 25 MG tablet Take 1 tablet (25 mg total) by mouth every 6 (six) hours as needed for nausea. 04/06/17  Yes Caren Griffins, MD  ranitidine (ZANTAC) 150 MG tablet Take 1 tablet (150 mg total) by mouth 2 (two) times daily. 04/06/17  Yes Caren Griffins, MD    Family History Family History  Problem Relation Age of Onset  . Colon cancer Paternal Uncle        Passed away 2015/01/25  . Nephrolithiasis Father   . Hypertension Father   . Nephrolithiasis Sister   . Nephrolithiasis Sister   . Nephrolithiasis Sister   . Hypertension Mother     Social History Social History  Substance Use Topics  . Smoking status: Current Every Day Smoker    Packs/day: 0.50    Years: 20.00    Types: Cigarettes  . Smokeless tobacco: Never Used     Comment: 5-7 cigarettes daily   . Alcohol use 2.4 oz/week    4 Shots of liquor per week     Comment: No ETOH in 4 months; Previously: occasionally, once every 2-3 months.     Allergies   Keflex [cephalexin]; Codeine; Tape; and Vicodin [hydrocodone-acetaminophen]   Review of Systems Review of Systems  Gastrointestinal: Positive for abdominal pain.  All other systems reviewed and are negative.    Physical Exam Updated Vital Signs BP (!) 116/103 (BP Location: Left Arm)   Pulse 80   Temp 97.2 F (36.2 C) (Oral)   Resp (!) 23   Ht 5' 2"  (1.575 m)   Wt 59 kg (130 lb)   LMP 04/28/2017   SpO2 94%   BMI 23.78 kg/m   Physical Exam  Nursing note and vitals reviewed.  31 year old female, somewhat uncomfortable feeling, but in no acute distress. Vital signs are significant for diastolic hypertension, and tachypnea. Oxygen saturation is 94%, which is normal. Head is normocephalic and atraumatic. PERRLA, EOMI. Oropharynx is clear. Neck is nontender and supple without adenopathy or JVD. Back is nontender and there is no CVA tenderness. Lungs are clear without rales, wheezes, or rhonchi. Chest is nontender. Heart has regular rate  and rhythm without murmur. Abdomen is soft, flat, with moderate tenderness in the epigastric area. There is no rebound or guarding. There are no masses or hepatosplenomegaly and peristalsis is hypoactive. Extremities have no cyanosis or edema, full range of motion is present. Skin is warm and dry. Several erythematous raised lesions are seen on her arms consistent with arthropod bites. Neurologic: Mental status is normal, cranial nerves are intact, there are no motor or sensory deficits.  ED Treatments / Results  Labs (all labs ordered are listed, but only abnormal results are displayed) Labs Reviewed  COMPREHENSIVE METABOLIC PANEL - Abnormal; Notable for the following:       Result Value   Potassium 2.8 (*)    CO2 17 (*)  Glucose, Bld 141 (*)    Creatinine, Ser 1.05 (*)    Calcium 8.8 (*)    ALT 13 (*)    Anion gap 16 (*)    All other components within normal limits  CBC WITH DIFFERENTIAL/PLATELET - Abnormal; Notable for the following:    WBC 16.4 (*)    Hemoglobin 11.6 (*)    HCT 34.9 (*)    RDW 15.8 (*)    Neutro Abs 12.2 (*)    Monocytes Absolute 1.5 (*)    All other components within normal limits  LIPASE, BLOOD  RAPID URINE DRUG SCREEN, HOSP PERFORMED  MAGNESIUM  I-STAT BETA HCG BLOOD, ED ([facility], [facility], [facility] ONLY)    Procedures Procedures (including critical care time)  Medications Ordered in ED Medications  promethazine (PHENERGAN) injection 25 mg (not administered)  sodium chloride 0.9 % bolus 1,000 mL (not administered)  sodium chloride 0.9 % bolus 1,000 mL (0 mLs Intravenous Stopped 04/29/17 0056)  metoCLOPramide (REGLAN) injection 10 mg (10 mg Intravenous Given 04/29/17 0034)  diphenhydrAMINE (BENADRYL) injection 25 mg (25 mg Intravenous Given 04/29/17 0038)  gi cocktail (Maalox,Lidocaine,Donnatal) (30 mLs Oral Given 04/29/17 0051)  ketorolac (TORADOL) 30 MG/ML injection 15 mg (15 mg Intravenous Given 04/29/17 0039)  pantoprazole (PROTONIX) injection 40 mg (40 mg  Intravenous Given 04/29/17 0045)  potassium chloride 10 mEq in 100 mL IVPB (0 mEq Intravenous Stopped 04/29/17 0333)  potassium chloride 10 mEq in 100 mL IVPB (0 mEq Intravenous Stopped 04/29/17 0304)     Initial Impression / Assessment and Plan / ED Course  I have reviewed the triage vital signs and the nursing notes.  Pertinent lab results that were available during my care of the patient were reviewed by me and considered in my medical decision making (see chart for details).  Epigastric pain with nausea and vomiting. Old records reviewed, and she has 5 previous ED visits in the last 6 months with similar complaints, and had to be admitted 3 times. Last hospital admission was on May 18. Of note, she did have a HIDA scan which showed a decreased ejection fraction and was supposed to be evaluated by general surgery for possible elective cholecystectomy.  She had received a dose of ondansetron without relief, so she was given a dose of metoclopramide. She was also given ketorolac, GI cocktail, and pantoprazole. She continued to have emesis with this. Electrodes come back showing severe hypokalemia as well as metabolic acidosis with normal anion gap. IV potassium was ordered, but has not been able to be given because patient is constantly running to the bathroom to retch and vomit. She is finally advised that she will is not allowed to go to the bathroom and she is given appropriate emesis bags to use in the room. IV fluids and potassium are resumed. Case is discussed with Dr. Shanon Brow [facility] who agrees to admit the patient.  Final Clinical Impressions(s) / ED Diagnoses   Final diagnoses:  Intractable vomiting with nausea, unspecified vomiting type  Upper abdominal pain  Hypokalemia  Metabolic acidosis  Normochromic normocytic anemia    New Prescriptions New Prescriptions   No medications on file     Delora Fuel, MD 02/63/78 0410

## 2017-04-29 NOTE — Progress Notes (Signed)
CT abd/pelvis results reviewed -some concern for mild thickening of ascending colon wall -neg GB -L-renal parenchymal stone 38m without hydronephrosis  UA with significant pyria although her stone can cause chronic pyuria and hematuria -sent urine culture  For findings above with leukocytosis, start cipro and flagyl -continue IVF for lactate 2.0  DTat

## 2017-04-29 NOTE — Progress Notes (Signed)
Text Dr. Carles Collet with critical lab value of 2.0 for lactic acid per lab.

## 2017-04-30 ENCOUNTER — Encounter: Payer: Self-pay | Admitting: Gastroenterology

## 2017-04-30 ENCOUNTER — Encounter: Payer: Self-pay | Admitting: Family Medicine

## 2017-04-30 ENCOUNTER — Telehealth: Payer: Self-pay | Admitting: Gastroenterology

## 2017-04-30 ENCOUNTER — Encounter (HOSPITAL_COMMUNITY): Payer: Self-pay | Admitting: Gastroenterology

## 2017-04-30 DIAGNOSIS — G43A0 Cyclical vomiting, not intractable: Secondary | ICD-10-CM

## 2017-04-30 DIAGNOSIS — R109 Unspecified abdominal pain: Secondary | ICD-10-CM

## 2017-04-30 DIAGNOSIS — R112 Nausea with vomiting, unspecified: Secondary | ICD-10-CM

## 2017-04-30 DIAGNOSIS — K529 Noninfective gastroenteritis and colitis, unspecified: Secondary | ICD-10-CM

## 2017-04-30 DIAGNOSIS — R1110 Vomiting, unspecified: Secondary | ICD-10-CM

## 2017-04-30 LAB — BASIC METABOLIC PANEL
ANION GAP: 7 (ref 5–15)
BUN: 15 mg/dL (ref 6–20)
CO2: 26 mmol/L (ref 22–32)
Calcium: 8.5 mg/dL — ABNORMAL LOW (ref 8.9–10.3)
Chloride: 103 mmol/L (ref 101–111)
Creatinine, Ser: 0.75 mg/dL (ref 0.44–1.00)
GFR calc Af Amer: >60 mL/min (ref >60)
GFR calc non Af Amer: >60 mL/min (ref >60)
Glucose, Bld: 84 mg/dL (ref 65–99)
POTASSIUM: 3.4 mmol/L — AB (ref 3.5–5.1)
SODIUM: 136 mmol/L (ref 135–145)

## 2017-04-30 LAB — CBC
HEMATOCRIT: 31.5 % — AB (ref 36.0–46.0)
HEMOGLOBIN: 10.3 g/dL — AB (ref 12.0–15.0)
MCH: 28.7 pg (ref 26.0–34.0)
MCHC: 32.7 g/dL (ref 30.0–36.0)
MCV: 87.7 fL (ref 78.0–100.0)
Platelets: 246 10*3/uL (ref 150–400)
RBC: 3.59 MIL/uL — ABNORMAL LOW (ref 3.87–5.11)
RDW: 15.9 % — ABNORMAL HIGH (ref 11.5–15.5)
WBC: 7.4 10*3/uL (ref 4.0–10.5)

## 2017-04-30 MED ORDER — POLYETHYLENE GLYCOL 3350 17 G PO PACK
17.0000 g | PACK | Freq: Every day | ORAL | Status: DC
  Administered 2017-04-30: 17 g via ORAL
  Filled 2017-04-30: qty 1

## 2017-04-30 MED ORDER — POLYETHYLENE GLYCOL 3350 17 G PO PACK: 17.0000 g | PACK | Freq: Every day | ORAL | 0 refills | Status: DC

## 2017-04-30 MED ORDER — PANTOPRAZOLE SODIUM 40 MG PO TBEC: 40.0000 mg | DELAYED_RELEASE_TABLET | Freq: Two times a day (BID) | ORAL | Status: DC

## 2017-04-30 MED ORDER — AMOXICILLIN-POT CLAVULANATE 875-125 MG PO TABS: 1.0000 | ORAL_TABLET | Freq: Two times a day (BID) | ORAL | 0 refills | Status: DC

## 2017-04-30 MED ORDER — PANTOPRAZOLE SODIUM 40 MG PO TBEC: 40.0000 mg | DELAYED_RELEASE_TABLET | Freq: Two times a day (BID) | ORAL | 0 refills | Status: DC

## 2017-04-30 NOTE — Telephone Encounter (Signed)
APPT MADE AND LETTER SENT  °

## 2017-04-30 NOTE — Discharge Summary (Signed)
Physician Discharge Summary  Jeanne Mcneil YQM:[id number] DOB: [birth year] DOA: 04/28/2017  PCP: Jeanne Ebbs, MD  Admit date: 04/28/2017 Discharge date: 04/30/2017  Recommendations for Outpatient Follow-up:  1. Ongoing care for chronic abdominal pain and recurrent nausea and vomiting.   Follow-up Information    Jeanne Ebbs, MD Follow up.   Specialty:  Internal Medicine Why:  as Jeanne Mcneil information: [address] [place] [address] [phone]        Jeanne Binder, MD Follow up.   Specialty:  Gastroenterology Why:  office will call you with appointment Contact information: [address] [place] [address] [phone]            Discharge Diagnoses:  1. Intractable nausea and vomiting 2. Colitis 3. Polysubstance abuse  Discharge Condition: Improved Disposition: Home  Diet recommendation: Regular  Filed Weights   04/28/17 2340 04/29/17 0600  Weight: 59 kg (130 lb) 57 kg (125 lb 11.2 oz)    History of present illness:  31 year old woman with chronic abdominal pain, nephrolithiasis, presented with increasing epigastric pain and vomiting after running out of Protonix and GI cocktail about a week ago which usually relieves her pain. Recently in the hospital in [place] and evaluated by surgery who recommended against cholecystectomy. Patient was referred for admission for hypokalemia and intractable nausea and vomiting.  Hospital Course:  Patient was treated with supportive care with rapid clinical improvement, resolution of nausea vomiting and pain. Tolerating a diet, seen by GI recommendations for discharge and outpatient follow-up with GI. Continue PPI BID and Miralax. CT scan suggested colitis, she was started on antibiotics. Given her clinical improvement, short course is favored at this point. Hospitalization was uncomplicated. Individual issues as below.  #1: Intractable nausea and vomiting, resolved. Previously seen by general  surgery, notable findings included abnormal HIDA scan, however general surgery did not recommend urgent cholecystectomy. Defer to follow-up in the outpatient setting with GI.  #2: Possible colitis. Based on CT. There are short course of antibiotics.  #3: GERD. Resume PPI BID on discharge  #4: Polysubstance abuse including marijuana, benzodiazepines.  #5: Noncompliance. Documentation but this hospitalization and previous hospitalization was notable for patient attempting to self induce vomiting.  Today's assessment: S: Feels much better. Very go home. Tolerating diet. No vomiting, no abdominal pain. O: Vitals: Afebrile, temperature 97.8, respirations 18, pulse 57, blood pressure 104/56.   Constitutional: Appears calm, comfortable.  Cardiovascular: Regular rate and rhythm. No murmur, rub or gallop.  Respiratory: Clear to auscultation bilaterally. No wheezes, rales or rhonchi. Normal respiratory effort.  Abdomen soft, nontender, nondistended.  Psychiatric: Grossly normal mood and affect. Speech fluent and appropriate.  Discharge Instructions  Discharge Instructions    Diet general    Complete by:  As directed    Increase activity slowly    Complete by:  As directed      Allergies as of 04/30/2017      Reactions   Keflex [cephalexin] Other (See Comments)   Reaction:  Bladder spasms   Codeine Hives, Itching   Tape Other (See Comments)   Reaction:  Burning    Vicodin [hydrocodone-acetaminophen] Hives, Itching      Medication List    STOP taking these medications   ketorolac 10 MG tablet Commonly known as:  TORADOL   promethazine 25 MG tablet Commonly known as:  PHENERGAN     TAKE these medications   amoxicillin-clavulanate 875-125 MG tablet Commonly known as:  AUGMENTIN Take 1 tablet by mouth 2 (two) times daily.  gi cocktail Susp suspension Take 30 mLs by mouth 3 (three) times daily as needed for indigestion. Shake well.   pantoprazole 40 MG tablet Commonly  known as:  PROTONIX Take 1 tablet (40 mg total) by mouth 2 (two) times daily.   polyethylene glycol packet Commonly known as:  MIRALAX / GLYCOLAX Take 17 g by mouth daily. Start taking on:  05/01/2017   ranitidine 150 MG tablet Commonly known as:  ZANTAC Take 1 tablet (150 mg total) by mouth 2 (two) times daily.      Allergies  Allergen Reactions  . Keflex [Cephalexin] Other (See Comments)    Reaction:  Bladder spasms  . Codeine Hives and Itching  . Tape Other (See Comments)    Reaction:  Burning   . Vicodin [Hydrocodone-Acetaminophen] Hives and Itching    The results of significant diagnostics from this hospitalization (including imaging, microbiology, ancillary and laboratory) are listed below for reference.    Significant Diagnostic Studies: Ct Abdomen Pelvis W Contrast  Result Date: 04/29/2017 CLINICAL DATA:  Abdominal pain x2 days, nausea/ vomiting, history of kidney stones and uterine fibroids EXAM: CT ABDOMEN AND PELVIS WITH CONTRAST TECHNIQUE: Multidetector CT imaging of the abdomen and pelvis was performed using the standard protocol following bolus administration of intravenous contrast. CONTRAST:  131m ISOVUE-300 IOPAMIDOL (ISOVUE-300) INJECTION 61% COMPARISON:  04/06/2017 FINDINGS: Lower chest: Lung bases are essentially clear. Hepatobiliary: Focal fat/ altered perfusion along the falciform ligament. Gallbladder is unremarkable. No intrahepatic or extrahepatic ductal dilatation. Pancreas: Within normal limits. Spleen: Within normal limits. Adrenals/Urinary Tract: Adrenal glands are within normal limits. 1.5 cm medial left upper pole renal cyst (series 2/ image 17). Right kidney is within normal limits. 6 mm calculus layering in the left renal collecting system (series 2/ image 24). Otherwise, no ureteral or bladder calculi. No hydronephrosis. Bladder is underdistended but unremarkable. Stomach/Bowel: Stomach is within normal limits. No evidence of bowel obstruction. Normal  appendix (series 2/ image 51). Ascending colon/ hepatic flexure is mildly thick-walled (series 5/ image 43). Transverse colon is mildly thick-walled although underdistended. No pneumatosis. Vascular/Lymphatic: No evidence of abdominal aortic aneurysm. Celiac artery, SMA, and IMA are patent. No suspicious abdominopelvic lymphadenopathy. Reproductive: Dominant 2.4 cm subserosal left uterine body fibroid (series 2/image 62). Bilateral ovaries are within normal limits. Other: No abdominopelvic ascites. No free air. Musculoskeletal: Visualized osseous structures are within normal limits. IMPRESSION: 6 mm calculus in the left proximal collecting system, unchanged. No hydronephrosis. No evidence of bowel obstruction.  Normal appendix. Right colon is mildly thick-walled, raising the possibility of infectious/ inflammatory colitis, although equivocal. No pneumatosis, ascites, or free air. Additional ancillary findings as above. Electronically Signed   By: SJulian HyM.D.   On: 04/29/2017 10:19   Labs: Basic Metabolic Panel:  Recent Labs Lab 04/29/17 0058 04/29/17 0542 04/30/17 0527  NA 141 138 136  K 2.8* 3.2* 3.4*  CL 108 102 103  CO2 17* 21* 26  GLUCOSE 141* 179* 84  BUN 20 22* 15  CREATININE 1.05* 0.91 0.75  CALCIUM 8.8* 9.1 8.5*  MG 1.5* 2.5*  --    Liver Function Tests:  Recent Labs Lab 04/29/17 0058  AST 22  ALT 13*  ALKPHOS 50  BILITOT 0.8  PROT 6.8  ALBUMIN 3.9    Recent Labs Lab 04/29/17 0058  LIPASE 13   CBC:  Recent Labs Lab 04/29/17 0058 04/29/17 0542 04/30/17 0527  WBC 16.4* 21.2* 7.4  NEUTROABS 12.2*  --   --   HGB 11.6* 12.0  10.3*  HCT 34.9* 37.0 31.5*  MCV 86.2 86.4 87.7  PLT 311 350 246    Principal Problem:   Nausea and vomiting Active Problems:   Abdominal pain, chronic, epigastric   GERD (gastroesophageal reflux disease)   Hypokalemia   Generalized anxiety disorder   Hypomagnesemia   Abdominal pain   Normochromic normocytic anemia    Cannabinoid hyperemesis syndrome ([place])   Acute colitis   Time coordinating discharge: 35 minutes  Signed:  Murray Hodgkins, MD [facility] 04/30/2017, 1:09 PM

## 2017-04-30 NOTE — Telephone Encounter (Signed)
Please arrange outpatient follow-up with Dr. Oneida Alar or myself (she is an [facility] patient and I saw her in the hospital) in next 4-6 weeks.

## 2017-04-30 NOTE — Consult Note (Signed)
Referring Provider: Dr. Derrill Kay  Primary Care Physician:  Nolene Ebbs, MD Primary Gastroenterologist:  Dr. Oneida Alar   Date of Admission: 04/29/17 Date of Consultation: 04/30/17  Reason for Consultation:  Acute on chronic abdominal pain, evaluate for need for EGD   HPI:  Jeanne Mcneil is a 31 y.o. year old female with a history of chronic abdominal pain, nephrolithiasis, chronic N/V, GERD, gastritis, presenting with acute on chronic abdominal pain, N/V. Hospitalized and many different occasions and has left AMA in the past. Last seen by our practice in Dec 2017 and felt she would benefit from mental health referral. EGD in June 2017 with esophagitis, mild gastritis, medium sized hiatal hernia.    Found by nursing staff yesterday morning in bathroom attempting to induce vomiting by sticking her finger down her throat. Persistent attempts to gag herself to induce vomiting per nursing notes. History of marijuana use and has had multiple prior admissions in past 6 months for N/V.   States she had new onset upper abdominal pain, bilateral abdominal pain, associated N/V. No diarrhea. Notes constipation. Feels like she needs to have a BM. Has issues with constipation. Takes Miralax at home, which helps. No rectal bleeding. Pain improved, but she still has discomfort. States once she has a BM, then abdominal pain starts again. States she just quit eating, so she isn't sure if N/V has resolved. States she has issues with finding foods that work for her due to history of GERD. Takes Protonix BID and Phenergan at home. Takes Zantac BID as well. Had been out of medications for about a week prior to admission.   CT abd/pelvis this admission with 6 mm calculus in left proximal collecting system unchanged from prior exam, no hydronephrosis, right colon mildly thick-walled, question of infectious/inflammatory colitis but states equivocal findings. Started on Cipro and Flagyl on 6/10 per attending.   As of  note, 5/15 ultrasound with mild gallbladder wall thickening, no stones or CBD dilatation. HIDA with patent cystic duct and low EF at 21% consistent with biliary dyskinesia. Previously evaluated by [facility] during last admission in May 2018 without need for inpatient intervention.   Past Medical History:  Diagnosis Date  . Anxiety   . Back pain   . Constipation   . Depression   . GERD (gastroesophageal reflux disease)   . Glaucoma of both eyes   . Hematuria   . History of abnormal cervical Pap smear   . History of chronic gastritis   . History of kidney stones   . Hydronephrosis, right   . Renal calculi    bilateral per ct 0902-2017  . Right ureteral stone   . Urgency of urination   . Uterine fibroid   . Uterine polyp     Past Surgical History:  Procedure Laterality Date  . CYSTOSCOPY W/ URETERAL STENT PLACEMENT Right 07/04/2016   Procedure: CYSTOSCOPY WITH RIGHT RETROGRADE PYELOGRAM, RIGHT URETERAL STENT PLACEMENT;  Surgeon: Ardis Hughs, MD;  Location: [facility];  Service: Urology;  Laterality: Right;  . CYSTOSCOPY WITH RETROGRADE PYELOGRAM, URETEROSCOPY AND STENT PLACEMENT Left 11/28/2007  . CYSTOSCOPY WITH RETROGRADE PYELOGRAM, URETEROSCOPY AND STENT PLACEMENT Right 08/02/2016   Procedure: CYSTOSCOPY WITH RIGHT  RETROGRADE PYELOGRAM, URETEROSCOPY,  AND STENT EXCHANGE;  Surgeon: Ardis Hughs, MD;  Location: [facility];  Service: Urology;  Laterality: Right;  . ESOPHAGOGASTRODUODENOSCOPY N/A 05/17/2016   Dr. Oneida Alar: LA Grade A esophagitis, mild gastritis s/p biopsy noting reactive gastritis, medium sized hiatal hernia,  next EGD WITH PROPOFOL   . INDUCED ABORTION  2008-01-25    Prior to Admission medications   Medication Sig Start Date End Date Taking? Authorizing Provider  Alum & Mag Hydroxide-Simeth (GI COCKTAIL) SUSP suspension Take 30 mLs by mouth 3 (three) times daily as needed for indigestion. Shake well. 04/06/17  Yes Gherghe, Vella Redhead, MD   ketorolac (TORADOL) 10 MG tablet Take 1 tablet (10 mg total) by mouth every 6 (six) hours as needed. 04/06/17  Yes Gherghe, Vella Redhead, MD  pantoprazole (PROTONIX) 40 MG tablet Take 1 tablet (40 mg total) by mouth 2 (two) times daily. 01/18/17  Yes Nita Sells, MD  ranitidine (ZANTAC) 150 MG tablet Take 1 tablet (150 mg total) by mouth 2 (two) times daily. 04/06/17  Yes Gherghe, Vella Redhead, MD  promethazine (PHENERGAN) 25 MG tablet Take 1 tablet (25 mg total) by mouth every 6 (six) hours as needed for nausea. Patient not taking: Reported on 04/29/2017 04/06/17   Caren Griffins, MD    Current Facility-Administered Medications  Medication Dose Route Frequency Provider Last Rate Last Dose  . ciprofloxacin (CIPRO) IVPB 400 mg  400 mg Intravenous Therisa Doyne, MD 200 mL/hr at 04/30/17 1003 400 mg at 04/30/17 1003  . enoxaparin (LOVENOX) injection 40 mg  40 mg Subcutaneous Q24H Tat, David, MD   40 mg at 04/30/17 1002  . feeding supplement (ENSURE ENLIVE) (ENSURE ENLIVE) liquid 237 mL  237 mL Oral BID BM Derrill Kay A, MD      . gi cocktail (Maalox,Lidocaine,Donnatal)  30 mL Oral TID PRN Phillips Grout, MD      . ketorolac (TORADOL) 30 MG/ML injection 30 mg  30 mg Intravenous Q6H PRN Phillips Grout, MD      . metroNIDAZOLE (FLAGYL) IVPB 500 mg  500 mg Intravenous Franco Collet, MD 100 mL/hr at 04/29/17 01-25-2116 500 mg at 04/29/17 Jan 25, 2116  . ondansetron (ZOFRAN) tablet 4 mg  4 mg Oral Q6H PRN Phillips Grout, MD       Or  . ondansetron (ZOFRAN) injection 4 mg  4 mg Intravenous Q6H PRN Phillips Grout, MD      . pantoprazole (PROTONIX) EC tablet 40 mg  40 mg Oral BID Derrill Kay A, MD   40 mg at 04/30/17 1002  . traMADol (ULTRAM) tablet 50 mg  50 mg Oral Q6H PRN Orson Eva, MD   50 mg at 04/29/17 01/24/15    Allergies as of 04/28/2017 - Review Complete 04/28/2017  Allergen Reaction Noted  . Keflex [cephalexin] Other (See Comments) 07/25/2016  . Codeine Hives and Itching 05/17/2013  . Tape Other  (See Comments) 08/02/2016  . Vicodin [hydrocodone-acetaminophen] Hives and Itching 09/16/2012    Family History  Problem Relation Age of Onset  . Colon cancer Paternal Uncle        Passed away 01/24/15  . Nephrolithiasis Father   . Hypertension Father   . Nephrolithiasis Sister   . Nephrolithiasis Sister   . Nephrolithiasis Sister   . Hypertension Mother     Social History   Social History  . Marital status: Single    Spouse name: N/A  . Number of children: 2  . Years of education: 11   Occupational History  . Unemployed    Social History Main Topics  . Smoking status: Current Every Day Smoker    Packs/day: 0.50    Years: 20.00    Types: Cigarettes  . Smokeless tobacco: Never Used  Comment: 5-7 cigarettes daily   . Alcohol use 2.4 oz/week    4 Shots of liquor per week     Comment: No ETOH in 4 months; Previously: occasionally, once every 2-3 months.  . Drug use: Yes    Types: Marijuana, Barbituates     Comment: "Only when I feel nauseated and can't function"  . Sexual activity: Yes    Birth control/ protection: None   Other Topics Concern  . Not on file   Social History Narrative   Lives with her father and step-mother. Unemployed.    Review of Systems: Gen: see HPI  CV: Denies chest pain, heart palpitations, syncope, edema  Resp: Denies shortness of breath with rest, cough, wheezing GI: see HPI  GU : Denies urinary burning, urinary frequency, urinary incontinence.  MS: Denies joint pain,swelling, cramping Derm: Denies rash, itching, dry skin Psych: Denies depression, anxiety,confusion, or memory loss Heme: see HPI   Physical Exam: Vital signs in last 24 hours: Temp:  [97.8 F (36.6 C)-98.5 F (36.9 C)] 97.8 F (36.6 C) (06/11 0500) Pulse Rate:  [57-66] 57 (06/11 0500) Resp:  [18] 18 (06/11 0500) BP: (104-116)/(56-64) 104/56 (06/11 0500) SpO2:  [98 %-100 %] 100 % (06/11 0500)   General:   Alert,  Well-developed, well-nourished, pleasant and  cooperative in NAD Head:  Normocephalic and atraumatic. Eyes:  Sclera clear, no icterus.   Conjunctiva pink. Ears:  Normal auditory acuity. Nose:  No deformity, discharge,  or lesions. Mouth:  No deformity or lesions, dentition normal. Lungs:  Clear throughout to auscultation.   No wheezes, crackles, or rhonchi. No acute distress. Heart:  Regular rate and rhythm; no murmurs, clicks, rubs,  or gallops. Abdomen:  Soft, mild TTP RLQ and nondistended. No masses, hepatosplenomegaly or hernias noted. Normal bowel sounds, without guarding, and without rebound.   Rectal:  Deferred until time of colonoscopy.   Msk:  Symmetrical without gross deformities. Normal posture. Extremities:  Without  edema. Neurologic:  Alert and  oriented x4 Psych:  Alert and cooperative. Normal mood and affect.  Intake/Output from previous day: 06/10 0701 - 06/11 0700 In: 600 [IV Piggyback:600] Out: -  Intake/Output this shift: No intake/output data recorded.  Lab Results:  Recent Labs  04/29/17 0058 04/29/17 0542 04/30/17 0527  WBC 16.4* 21.2* 7.4  HGB 11.6* 12.0 10.3*  HCT 34.9* 37.0 31.5*  PLT 311 350 246   BMET  Recent Labs  04/29/17 0058 04/29/17 0542 04/30/17 0527  NA 141 138 136  K 2.8* 3.2* 3.4*  CL 108 102 103  CO2 17* 21* 26  GLUCOSE 141* 179* 84  BUN 20 22* 15  CREATININE 1.05* 0.91 0.75  CALCIUM 8.8* 9.1 8.5*   LFT  Recent Labs  04/29/17 0058  PROT 6.8  ALBUMIN 3.9  AST 22  ALT 13*  ALKPHOS 50  BILITOT 0.8    Studies/Results: Ct Abdomen Pelvis W Contrast  Result Date: 04/29/2017 CLINICAL DATA:  Abdominal pain x2 days, nausea/ vomiting, history of kidney stones and uterine fibroids EXAM: CT ABDOMEN AND PELVIS WITH CONTRAST TECHNIQUE: Multidetector CT imaging of the abdomen and pelvis was performed using the standard protocol following bolus administration of intravenous contrast. CONTRAST:  174m ISOVUE-300 IOPAMIDOL (ISOVUE-300) INJECTION 61% COMPARISON:  04/06/2017  FINDINGS: Lower chest: Lung bases are essentially clear. Hepatobiliary: Focal fat/ altered perfusion along the falciform ligament. Gallbladder is unremarkable. No intrahepatic or extrahepatic ductal dilatation. Pancreas: Within normal limits. Spleen: Within normal limits. Adrenals/Urinary Tract: Adrenal glands are within normal  limits. 1.5 cm medial left upper pole renal cyst (series 2/ image 17). Right kidney is within normal limits. 6 mm calculus layering in the left renal collecting system (series 2/ image 24). Otherwise, no ureteral or bladder calculi. No hydronephrosis. Bladder is underdistended but unremarkable. Stomach/Bowel: Stomach is within normal limits. No evidence of bowel obstruction. Normal appendix (series 2/ image 51). Ascending colon/ hepatic flexure is mildly thick-walled (series 5/ image 43). Transverse colon is mildly thick-walled although underdistended. No pneumatosis. Vascular/Lymphatic: No evidence of abdominal aortic aneurysm. Celiac artery, SMA, and IMA are patent. No suspicious abdominopelvic lymphadenopathy. Reproductive: Dominant 2.4 cm subserosal left uterine body fibroid (series 2/image 62). Bilateral ovaries are within normal limits. Other: No abdominopelvic ascites. No free air. Musculoskeletal: Visualized osseous structures are within normal limits. IMPRESSION: 6 mm calculus in the left proximal collecting system, unchanged. No hydronephrosis. No evidence of bowel obstruction.  Normal appendix. Right colon is mildly thick-walled, raising the possibility of infectious/ inflammatory colitis, although equivocal. No pneumatosis, ascites, or free air. Additional ancillary findings as above. Electronically Signed   By: Julian Hy M.D.   On: 04/29/2017 10:19    Impression: 31 year old female with chronic abdominal pain, intermittent N/V,  nephrolithiasis, GERD, biliary dyskinesia, presenting with acute on chronic abdominal pain and associated N/V. Clinically, she seems to have  improved and is actually requesting to go home.   Leukocytosis: resolved. History of chronic intermittent leukocytosis. Urine culture pending. Reviewed CT, and mild right colonic wall thickening is equivocal and clinically does not appear to have colitis. Empiric antibiotics per attending.  Abdominal pain: improved. Could consider outpatient elective consult for cholecystectomy due to biliary dyskinesia; however, I suspect she will still be dealing with chronic abdominal pain.   Nausea and vomiting: improved. Patient requesting advancement of diet. Likely multifactorial in setting of acute illness, uncontrolled GERD (was out of medications for a week). Recommend cessation of marijuana.   Constipation: resume Miralax once daily, as she uses this at home.   Colitis on CT: clinically does not appear to have colitis. Would limit antibiotics to short course and consider elective outpatient colonoscopy.  Plan: Advance to soft diet PPI BID Add Miralax once daily Outpatient consultation with general surgery Outpatient follow-up with Dr. Oneida Alar to discuss colonoscopy Urine culture pending: per hospitalist  Annitta Needs, PhD, ANP-BC [facility]    LOS: 1 day    04/30/2017, 10:07 AM

## 2017-05-01 LAB — URINE CULTURE

## 2017-05-12 ENCOUNTER — Emergency Department (HOSPITAL_COMMUNITY)
Admission: EM | Admit: 2017-05-12 | Discharge: 2017-05-12 | Discharge status: Against Medical Advice | Payer: Self-pay | Attending: Emergency Medicine | Admitting: Emergency Medicine

## 2017-05-12 ENCOUNTER — Encounter (HOSPITAL_COMMUNITY): Payer: Self-pay

## 2017-05-12 DIAGNOSIS — Z79899 Other long term (current) drug therapy: Secondary | ICD-10-CM | POA: Insufficient documentation

## 2017-05-12 DIAGNOSIS — F17210 Nicotine dependence, cigarettes, uncomplicated: Secondary | ICD-10-CM | POA: Insufficient documentation

## 2017-05-12 DIAGNOSIS — G43A1 Cyclical vomiting, intractable: Secondary | ICD-10-CM | POA: Insufficient documentation

## 2017-05-12 DIAGNOSIS — R1115 Cyclical vomiting syndrome unrelated to migraine: Secondary | ICD-10-CM

## 2017-05-12 LAB — COMPREHENSIVE METABOLIC PANEL
ALBUMIN: 5.4 g/dL — AB (ref 3.5–5.0)
ALK PHOS: 70 U/L (ref 38–126)
ALT: 18 U/L (ref 14–54)
ANION GAP: 23 — AB (ref 5–15)
AST: 26 U/L (ref 15–41)
BILIRUBIN TOTAL: 0.7 mg/dL (ref 0.3–1.2)
BUN: 31 mg/dL — ABNORMAL HIGH (ref 6–20)
CALCIUM: 10.1 mg/dL (ref 8.9–10.3)
CO2: 19 mmol/L — ABNORMAL LOW (ref 22–32)
Chloride: 93 mmol/L — ABNORMAL LOW (ref 101–111)
Creatinine, Ser: 2.65 mg/dL — ABNORMAL HIGH (ref 0.44–1.00)
GFR, EST AFRICAN AMERICAN: 26 mL/min — AB (ref >60)
GFR, EST NON AFRICAN AMERICAN: 23 mL/min — AB (ref >60)
Glucose, Bld: 182 mg/dL — ABNORMAL HIGH (ref 65–99)
Potassium: 3.9 mmol/L (ref 3.5–5.1)
Sodium: 135 mmol/L (ref 135–145)
TOTAL PROTEIN: 8.9 g/dL — AB (ref 6.5–8.1)

## 2017-05-12 LAB — CBC WITH DIFFERENTIAL/PLATELET
Basophils Absolute: 0.1 10*3/uL (ref 0.0–0.1)
Basophils Relative: 0 %
Eosinophils Absolute: 0 10*3/uL (ref 0.0–0.7)
Eosinophils Relative: 0 %
HEMATOCRIT: 42.9 % (ref 36.0–46.0)
HEMOGLOBIN: 14.2 g/dL (ref 12.0–15.0)
LYMPHS ABS: 2 10*3/uL (ref 0.7–4.0)
Lymphocytes Relative: 9 %
MCH: 28.5 pg (ref 26.0–34.0)
MCHC: 33.1 g/dL (ref 30.0–36.0)
MCV: 86 fL (ref 78.0–100.0)
MONO ABS: 1.3 10*3/uL — AB (ref 0.1–1.0)
MONOS PCT: 6 %
NEUTROS ABS: 18.4 10*3/uL — AB (ref 1.7–7.7)
NEUTROS PCT: 85 %
Platelets: 433 10*3/uL — ABNORMAL HIGH (ref 150–400)
RBC: 4.99 MIL/uL (ref 3.87–5.11)
RDW: 15.6 % — AB (ref 11.5–15.5)
WBC: 21.7 10*3/uL — ABNORMAL HIGH (ref 4.0–10.5)

## 2017-05-12 LAB — LIPASE, BLOOD: LIPASE: 18 U/L (ref 11–51)

## 2017-05-12 MED ORDER — PANTOPRAZOLE SODIUM 40 MG IV SOLR
40.0000 mg | Freq: Once | INTRAVENOUS | Status: AC
  Administered 2017-05-12: 40 mg via INTRAVENOUS
  Filled 2017-05-12: qty 40

## 2017-05-12 MED ORDER — DIPHENHYDRAMINE HCL 50 MG/ML IJ SOLN
25.0000 mg | Freq: Once | INTRAMUSCULAR | Status: AC
  Administered 2017-05-12: 25 mg via INTRAVENOUS
  Filled 2017-05-12: qty 1

## 2017-05-12 MED ORDER — ONDANSETRON HCL 4 MG/2ML IJ SOLN
4.0000 mg | Freq: Once | INTRAMUSCULAR | Status: AC
  Administered 2017-05-12: 4 mg via INTRAVENOUS
  Filled 2017-05-12: qty 2

## 2017-05-12 MED ORDER — SODIUM CHLORIDE 0.9 % IV SOLN
INTRAVENOUS | Status: DC
  Administered 2017-05-12: 08:00:00 via INTRAVENOUS

## 2017-05-12 MED ORDER — SODIUM CHLORIDE 0.9 % IV BOLUS (SEPSIS)
1000.0000 mL | Freq: Once | INTRAVENOUS | Status: AC
  Administered 2017-05-12: 1000 mL via INTRAVENOUS

## 2017-05-12 MED ORDER — METOCLOPRAMIDE HCL 5 MG/ML IJ SOLN
10.0000 mg | Freq: Once | INTRAMUSCULAR | Status: AC
  Administered 2017-05-12: 10 mg via INTRAVENOUS
  Filled 2017-05-12: qty 2

## 2017-05-12 NOTE — ED Notes (Signed)
Pt came up to nurses station requesting pain medication. Explained to pt that no pain medication can be given without MD order. Pt verbalized understanding and returned to room.

## 2017-05-12 NOTE — ED Triage Notes (Signed)
Chronic gastritis, states she started vomiting last night.

## 2017-05-12 NOTE — ED Notes (Signed)
Pt came out to RN station again stating "I just need someone to help me with this pain." Informed pt that EDP has signed up for patient and will be rounding shortly.

## 2017-05-12 NOTE — ED Notes (Signed)
Unable to obtain vitals due to patient constantly getting up out of bed to go to the bathroom. Will try again later.

## 2017-05-12 NOTE — ED Notes (Addendum)
EDP at bedside  

## 2017-05-12 NOTE — ED Notes (Signed)
Pt came out to RN station stating, "I am getting my mom to come get me, I need a bath.I will come back later" EDP notified. Pt informed she will have to sign out AMA. Pt verbalized understanding of risks involving leaving AMA. Pt removed IV on her own. Bleeding controlled with pressure. Pt ambulatory. AOx4.

## 2017-05-12 NOTE — ED Notes (Signed)
Pt can be heard moaning and "dry heaving" from RN station.

## 2017-05-12 NOTE — ED Notes (Signed)
Pt refused d/c vitals.

## 2017-05-12 NOTE — ED Provider Notes (Signed)
[facility] DEPT Provider Note   CSN: [id number] Arrival date & time: 05/12/17  0424     History   Chief Complaint Chief Complaint  Patient presents with  . Emesis    HPI Jeanne Mcneil is a 31 y.o. female.  Patient with history of chronic recurrent abdominal pain. Patient with recent admission June 10. For persistent vomiting and electrolyte abnormalities. Patient does carry the diagnosis of cannabinoid hyperemesis syndrome. Patient's symptoms are somewhat like a cyclical vomiting type process. Patient states that she ran out of her GI cocktail and her Protonix. Patient states the vomiting just started last night. Associated with right-sided abdominal pain.      Past Medical History:  Diagnosis Date  . Anxiety   . Back pain   . Constipation   . Depression   . GERD (gastroesophageal reflux disease)   . Glaucoma of both eyes   . Hematuria   . History of abnormal cervical Pap smear   . History of chronic gastritis   . History of kidney stones   . Hydronephrosis, right   . Renal calculi    bilateral per ct 0902-2017  . Right ureteral stone   . Urgency of urination   . Uterine fibroid   . Uterine polyp     Patient Active Problem List   Diagnosis Date Noted  . Hypomagnesemia 04/29/2017  . Cannabinoid hyperemesis syndrome ([place]) 04/29/2017  . Acute colitis 04/29/2017  . Abdominal pain   . Normochromic normocytic anemia   . Nausea & vomiting 04/02/2017  . Acute renal failure ([place]) 01/16/2017  . Hyponatremia 01/16/2017  . Leukocytosis 01/16/2017  . Nausea and vomiting 01/15/2017  . Chest pain 01/15/2017  . Pyelonephritis 10/11/2016  . Hypokalemia 10/11/2016  . Hyperglycemia 10/11/2016  . Elevated blood pressure reading 10/11/2016  . Left nephrolithiasis 10/11/2016  . Renal colic on left side 17/79/3903  . Generalized anxiety disorder 10/11/2016  . Nausea with vomiting 07/26/2016  . Ureteral stone 07/04/2016  . UTI (urinary tract infection) 07/04/2016  .  Hydronephrosis of right kidney 07/04/2016  . GERD (gastroesophageal reflux disease) 07/04/2016  . Ureteral calculi 07/04/2016  . Abdominal pain, chronic, epigastric   . Hematemesis 05/16/2016  . Heme + stool 05/16/2016  . Abdominal pain, epigastric 05/16/2016    Past Surgical History:  Procedure Laterality Date  . CYSTOSCOPY W/ URETERAL STENT PLACEMENT Right 07/04/2016   Procedure: CYSTOSCOPY WITH RIGHT RETROGRADE PYELOGRAM, RIGHT URETERAL STENT PLACEMENT;  Surgeon: Ardis Hughs, MD;  Location: [facility];  Service: Urology;  Laterality: Right;  . CYSTOSCOPY WITH RETROGRADE PYELOGRAM, URETEROSCOPY AND STENT PLACEMENT Left 11/28/2007  . CYSTOSCOPY WITH RETROGRADE PYELOGRAM, URETEROSCOPY AND STENT PLACEMENT Right 08/02/2016   Procedure: CYSTOSCOPY WITH RIGHT  RETROGRADE PYELOGRAM, URETEROSCOPY,  AND STENT EXCHANGE;  Surgeon: Ardis Hughs, MD;  Location: [facility];  Service: Urology;  Laterality: Right;  . ESOPHAGOGASTRODUODENOSCOPY N/A 05/17/2016   Dr. Oneida Alar: LA Grade A esophagitis, mild gastritis s/p biopsy noting reactive gastritis, medium sized hiatal hernia, next EGD WITH PROPOFOL   . INDUCED ABORTION  2009    OB History    Gravida Para Term Preterm AB Living   3 2 2   1 2    SAB TAB Ectopic Multiple Live Births   1       2       Home Medications    Prior to Admission medications   Medication Sig Start Date End Date Taking? Authorizing Provider  Alum & Mag Hydroxide-Simeth (GI COCKTAIL)  SUSP suspension Take 30 mLs by mouth 3 (three) times daily as needed for indigestion. Shake well. 04/06/17   Caren Griffins, MD  amoxicillin-clavulanate (AUGMENTIN) 875-125 MG tablet Take 1 tablet by mouth 2 (two) times daily. 04/30/17   Samuella Cota, MD  pantoprazole (PROTONIX) 40 MG tablet Take 1 tablet (40 mg total) by mouth 2 (two) times daily. 04/30/17   Samuella Cota, MD  polyethylene glycol [facility]) packet Take 17 g by mouth daily. 05/01/17    Samuella Cota, MD  ranitidine (ZANTAC) 150 MG tablet Take 1 tablet (150 mg total) by mouth 2 (two) times daily. 04/06/17   Caren Griffins, MD    Family History Family History  Problem Relation Age of Onset  . Colon cancer Paternal Uncle        Passed away 01-22-2015  . Nephrolithiasis Father   . Hypertension Father   . Nephrolithiasis Sister   . Nephrolithiasis Sister   . Nephrolithiasis Sister   . Hypertension Mother     Social History Social History  Substance Use Topics  . Smoking status: Current Every Day Smoker    Packs/day: 0.50    Years: 20.00    Types: Cigarettes  . Smokeless tobacco: Never Used     Comment: 5-7 cigarettes daily   . Alcohol use 2.4 oz/week    4 Shots of liquor per week     Comment: No ETOH in 4 months; Previously: occasionally, once every 2-3 months.     Allergies   Keflex [cephalexin]; Codeine; Tape; and Vicodin [hydrocodone-acetaminophen]   Review of Systems Review of Systems  Constitutional: Negative for fever.  HENT: Negative for congestion.   Eyes: Negative for redness.  Respiratory: Negative for shortness of breath.   Cardiovascular: Negative for chest pain.  Gastrointestinal: Positive for abdominal pain, nausea and vomiting.  Genitourinary: Negative for dysuria.  Musculoskeletal: Positive for back pain.  Skin: Negative for rash.  Neurological: Negative for headaches.  Hematological: Does not bruise/bleed easily.  Psychiatric/Behavioral: Negative for confusion.     Physical Exam Updated Vital Signs BP (!) 137/91 (BP Location: Left Arm)   Pulse 81   Temp 97.9 F (36.6 C) (Oral)   Resp 20   Ht 1.575 m (5' 2" )   Wt 59 kg (130 lb)   LMP 04/28/2017   SpO2 98%   BMI 23.78 kg/m   Physical Exam  Constitutional: She is oriented to person, place, and time. She appears well-developed and well-nourished. She appears distressed.  HENT:  Head: Normocephalic and atraumatic.  Because membranes dry  Eyes: Conjunctivae and EOM are  normal. Pupils are equal, round, and reactive to light.  Neck: Neck supple.  Cardiovascular: Normal rate and regular rhythm.   Pulmonary/Chest: Effort normal and breath sounds normal.  Abdominal: She exhibits no distension.  Musculoskeletal: Normal range of motion.  Neurological: She is alert and oriented to person, place, and time. No cranial nerve deficit. She exhibits normal muscle tone. Coordination normal.  Skin: Skin is warm.  Nursing note and vitals reviewed.    ED Treatments / Results  Labs (all labs ordered are listed, but only abnormal results are displayed) Labs Reviewed  CBC WITH DIFFERENTIAL/PLATELET - Abnormal; Notable for the following:       Result Value   WBC 21.7 (*)    RDW 15.6 (*)    Platelets 433 (*)    Neutro Abs 18.4 (*)    Monocytes Absolute 1.3 (*)    All other components  within normal limits  COMPREHENSIVE METABOLIC PANEL - Abnormal; Notable for the following:    Chloride 93 (*)    CO2 19 (*)    Glucose, Bld 182 (*)    BUN 31 (*)    Creatinine, Ser 2.65 (*)    Total Protein 8.9 (*)    Albumin 5.4 (*)    GFR calc non Af Amer 23 (*)    GFR calc Af Amer 26 (*)    Anion gap 23 (*)    All other components within normal limits  LIPASE, BLOOD    EKG  EKG Interpretation None       Radiology No results found.  Procedures Procedures (including critical care time)  Medications Ordered in ED Medications  0.9 %  sodium chloride infusion ( Intravenous Stopped 05/12/17 0930)  metoCLOPramide (REGLAN) injection 10 mg (10 mg Intravenous Given 05/12/17 0519)  diphenhydrAMINE (BENADRYL) injection 25 mg (25 mg Intravenous Given 05/12/17 0519)  sodium chloride 0.9 % bolus 1,000 mL (0 mLs Intravenous Stopped 05/12/17 0916)  ondansetron (ZOFRAN) injection 4 mg (4 mg Intravenous Given 05/12/17 0820)  metoCLOPramide (REGLAN) injection 10 mg (10 mg Intravenous Given 05/12/17 0820)  pantoprazole (PROTONIX) injection 40 mg (40 mg Intravenous Given 05/12/17 0820)      Initial Impression / Assessment and Plan / ED Course  I have reviewed the triage vital signs and the nursing notes.  Pertinent labs & imaging results that were available during my care of the patient were reviewed by me and considered in my medical decision making (see chart for details).    Patient will requesting to leave. Patient's on a care plan. Patient states she'll come back later. Patient's electrolytes here shows significant dehydration. Patient would benefit from additional IV hydration and possible admission. Patient still will not stay. Patient has a history of chronic recurrent abdominal pain and cyclical vomiting type symptoms. Today though potassium is normal.  Patient Requesting narcotic pain medication. Patient was informed that she is on a care plan. And that we would treat the vomiting. Patient received Reglan Protonix and Zofran.  Patient will be leaving AMA.   Final Clinical Impressions(s) / ED Diagnoses   Final diagnoses:  Intractable cyclical vomiting with nausea    New Prescriptions Discharge Medication List as of 05/12/2017  9:31 AM       Fredia Sorrow, MD 05/12/17 [phone]

## 2017-05-12 NOTE — ED Notes (Signed)
Went to the patient's room to start her iv and medicated per orders but the patient has been in the bathroom. I will continue to attempt to start her iv when she comes out of the bathroom

## 2017-05-12 NOTE — ED Notes (Signed)
Pt continues to make wretching noises and "dry heaving."

## 2017-06-11 ENCOUNTER — Encounter (HOSPITAL_COMMUNITY): Payer: Self-pay | Admitting: Emergency Medicine

## 2017-06-11 ENCOUNTER — Emergency Department (HOSPITAL_COMMUNITY)
Admission: EM | Admit: 2017-06-11 | Discharge: 2017-06-11 | Discharge status: Against Medical Advice | Payer: Self-pay | Attending: Emergency Medicine | Admitting: Emergency Medicine

## 2017-06-11 DIAGNOSIS — R1084 Generalized abdominal pain: Secondary | ICD-10-CM | POA: Insufficient documentation

## 2017-06-11 DIAGNOSIS — F17210 Nicotine dependence, cigarettes, uncomplicated: Secondary | ICD-10-CM | POA: Insufficient documentation

## 2017-06-11 DIAGNOSIS — G43A0 Cyclical vomiting, not intractable: Secondary | ICD-10-CM | POA: Insufficient documentation

## 2017-06-11 LAB — CBC WITH DIFFERENTIAL/PLATELET
BASOS ABS: 0 10*3/uL (ref 0.0–0.1)
BASOS PCT: 0 %
EOS ABS: 0 10*3/uL (ref 0.0–0.7)
Eosinophils Relative: 0 %
HEMATOCRIT: 42.7 % (ref 36.0–46.0)
HEMOGLOBIN: 14.3 g/dL (ref 12.0–15.0)
Lymphocytes Relative: 11 %
Lymphs Abs: 2.4 10*3/uL (ref 0.7–4.0)
MCH: 28.8 pg (ref 26.0–34.0)
MCHC: 33.5 g/dL (ref 30.0–36.0)
MCV: 85.9 fL (ref 78.0–100.0)
Monocytes Absolute: 1.1 10*3/uL — ABNORMAL HIGH (ref 0.1–1.0)
Monocytes Relative: 5 %
NEUTROS ABS: 17.1 10*3/uL — AB (ref 1.7–7.7)
NEUTROS PCT: 84 %
Platelets: 399 10*3/uL (ref 150–400)
RBC: 4.97 MIL/uL (ref 3.87–5.11)
RDW: 15.8 % — ABNORMAL HIGH (ref 11.5–15.5)
WBC: 20.6 10*3/uL — AB (ref 4.0–10.5)

## 2017-06-11 LAB — COMPREHENSIVE METABOLIC PANEL
ALBUMIN: 5 g/dL (ref 3.5–5.0)
ALK PHOS: 67 U/L (ref 38–126)
ALT: 22 U/L (ref 14–54)
ANION GAP: 21 — AB (ref 5–15)
AST: 32 U/L (ref 15–41)
BILIRUBIN TOTAL: 1.1 mg/dL (ref 0.3–1.2)
BUN: 33 mg/dL — ABNORMAL HIGH (ref 6–20)
CALCIUM: 9.4 mg/dL (ref 8.9–10.3)
CO2: 16 mmol/L — ABNORMAL LOW (ref 22–32)
Chloride: 97 mmol/L — ABNORMAL LOW (ref 101–111)
Creatinine, Ser: 2.69 mg/dL — ABNORMAL HIGH (ref 0.44–1.00)
GFR calc non Af Amer: 22 mL/min — ABNORMAL LOW (ref >60)
GFR, EST AFRICAN AMERICAN: 26 mL/min — AB (ref >60)
GLUCOSE: 228 mg/dL — AB (ref 65–99)
POTASSIUM: 3.3 mmol/L — AB (ref 3.5–5.1)
Sodium: 134 mmol/L — ABNORMAL LOW (ref 135–145)
TOTAL PROTEIN: 8.5 g/dL — AB (ref 6.5–8.1)

## 2017-06-11 LAB — LIPASE, BLOOD: LIPASE: 19 U/L (ref 11–51)

## 2017-06-11 MED ORDER — METOCLOPRAMIDE HCL 5 MG/ML IJ SOLN
10.0000 mg | Freq: Once | INTRAMUSCULAR | Status: AC
  Administered 2017-06-11: 10 mg via INTRAVENOUS
  Filled 2017-06-11: qty 2

## 2017-06-11 MED ORDER — KETOROLAC TROMETHAMINE 30 MG/ML IJ SOLN
30.0000 mg | Freq: Once | INTRAMUSCULAR | Status: AC
  Administered 2017-06-11: 30 mg via INTRAVENOUS
  Filled 2017-06-11: qty 1

## 2017-06-11 MED ORDER — PANTOPRAZOLE SODIUM 40 MG IV SOLR
40.0000 mg | Freq: Once | INTRAVENOUS | Status: AC
  Administered 2017-06-11: 40 mg via INTRAVENOUS
  Filled 2017-06-11: qty 40

## 2017-06-11 MED ORDER — SODIUM CHLORIDE 0.9 % IV BOLUS (SEPSIS)
1000.0000 mL | Freq: Once | INTRAVENOUS | Status: AC
  Administered 2017-06-11: 1000 mL via INTRAVENOUS

## 2017-06-11 NOTE — ED Notes (Signed)
Pt states wanted IV out so she could go. Advised needing fluids and to wait on labs. Pt states she is just going now. Pt more calm at dc than when first arrived and has not been dry heaving since meds given.

## 2017-06-11 NOTE — ED Notes (Signed)
Pt caught sticking her fingers down her throat again. Advised her meds will not help her if she keeps doing that

## 2017-06-11 NOTE — ED Provider Notes (Signed)
Emergency Department Provider Note   I have reviewed the triage vital signs and the nursing notes.   HISTORY  Chief Complaint Abdominal Pain and Emesis   HPI Jeanne Mcneil is a 31 y.o. female with PMH of chronic abdominal pain, kidney stones, and cyclical vomiting syndrome resents emergency department for evaluation of diffuse abdominal pain with multiple episodes of vomiting. Symptoms began 2 days ago. Patient has not followed up with gastroenterology as advised during previous hospitalizations and any presentations. She reports being compliant with her Reglan and Protonix but states that she "just couldn't" today and called EMS for transport to the emergency department. She denies chest pain or difficulty breathing. She's had some subjective fevers but no chills. No dysuria, hesitancy, urgency. No vaginal bleeding or discharge. No blood in the stools. Last marijuana use 2 weeks prior. Denies other drug or EtOH use.    Past Medical History:  Diagnosis Date  . Anxiety   . Back pain   . Constipation   . Depression   . GERD (gastroesophageal reflux disease)   . Glaucoma of both eyes   . Hematuria   . History of abnormal cervical Pap smear   . History of chronic gastritis   . History of kidney stones   . Hydronephrosis, right   . Renal calculi    bilateral per ct 0902-2017  . Right ureteral stone   . Urgency of urination   . Uterine fibroid   . Uterine polyp     Patient Active Problem List   Diagnosis Date Noted  . Hypomagnesemia 04/29/2017  . Cannabinoid hyperemesis syndrome ([place]) 04/29/2017  . Acute colitis 04/29/2017  . Abdominal pain   . Normochromic normocytic anemia   . Nausea & vomiting 04/02/2017  . Acute renal failure ([place]) 01/16/2017  . Hyponatremia 01/16/2017  . Leukocytosis 01/16/2017  . Nausea and vomiting 01/15/2017  . Chest pain 01/15/2017  . Pyelonephritis 10/11/2016  . Hypokalemia 10/11/2016  . Hyperglycemia 10/11/2016  . Elevated blood  pressure reading 10/11/2016  . Left nephrolithiasis 10/11/2016  . Renal colic on left side 84/69/6295  . Generalized anxiety disorder 10/11/2016  . Nausea with vomiting 07/26/2016  . Ureteral stone 07/04/2016  . UTI (urinary tract infection) 07/04/2016  . Hydronephrosis of right kidney 07/04/2016  . GERD (gastroesophageal reflux disease) 07/04/2016  . Ureteral calculi 07/04/2016  . Abdominal pain, chronic, epigastric   . Hematemesis 05/16/2016  . Heme + stool 05/16/2016  . Abdominal pain, epigastric 05/16/2016    Past Surgical History:  Procedure Laterality Date  . CYSTOSCOPY W/ URETERAL STENT PLACEMENT Right 07/04/2016   Procedure: CYSTOSCOPY WITH RIGHT RETROGRADE PYELOGRAM, RIGHT URETERAL STENT PLACEMENT;  Surgeon: Ardis Hughs, MD;  Location: [facility];  Service: Urology;  Laterality: Right;  . CYSTOSCOPY WITH RETROGRADE PYELOGRAM, URETEROSCOPY AND STENT PLACEMENT Left 11/28/2007  . CYSTOSCOPY WITH RETROGRADE PYELOGRAM, URETEROSCOPY AND STENT PLACEMENT Right 08/02/2016   Procedure: CYSTOSCOPY WITH RIGHT  RETROGRADE PYELOGRAM, URETEROSCOPY,  AND STENT EXCHANGE;  Surgeon: Ardis Hughs, MD;  Location: [facility];  Service: Urology;  Laterality: Right;  . ESOPHAGOGASTRODUODENOSCOPY N/A 05/17/2016   Dr. Oneida Alar: LA Grade A esophagitis, mild gastritis s/p biopsy noting reactive gastritis, medium sized hiatal hernia, next EGD WITH PROPOFOL   . INDUCED ABORTION  2009    Current Outpatient Rx  . Order #: [id number] Class: Normal  . Order #: [id number] Class: Historical Med  . Order #: [id number] Class: Print  . Order #: [id number] Class: Normal  . Order #: [id number] Class:  Print    Allergies Keflex [cephalexin]; Codeine; Tape; and Vicodin [hydrocodone-acetaminophen]  Family History  Problem Relation Age of Onset  . Colon cancer Paternal Uncle        Passed away 24-Jan-2015  . Nephrolithiasis Father   . Hypertension Father   . Nephrolithiasis Sister   . Nephrolithiasis  Sister   . Nephrolithiasis Sister   . Hypertension Mother     Social History Social History  Substance Use Topics  . Smoking status: Current Every Day Smoker    Packs/day: 0.50    Years: 20.00    Types: Cigarettes  . Smokeless tobacco: Never Used     Comment: 5-7 cigarettes daily   . Alcohol use 2.4 oz/week    4 Shots of liquor per week     Comment: No ETOH in 4 months; Previously: occasionally, once every 2-3 months.    Review of Systems  Constitutional: No fever/chills Eyes: No visual changes. ENT: No sore throat. Cardiovascular: Denies chest pain. Respiratory: Denies shortness of breath. Gastrointestinal: Positive diffuse abdominal pain. Positive nausea and vomiting.  No diarrhea.  No constipation. Genitourinary: Negative for dysuria. Musculoskeletal: Negative for back pain. Skin: Negative for rash. Neurological: Negative for headaches, focal weakness or numbness.  10-point ROS otherwise negative.  ____________________________________________   PHYSICAL EXAM:  VITAL SIGNS: ED Triage Vitals [06/11/17 1221]  Enc Vitals Group     BP      Pulse      Resp      Temp      Temp src      SpO2      Weight      Height      Head Circumference      Peak Flow      Pain Score 10     Pain Loc      Pain Edu?      Excl. in [place]?    Constitutional: Patient is tearful and spitting into an emesis bag.  Eyes: Conjunctivae are normal.  Head: Atraumatic. Nose: No congestion/rhinnorhea. Mouth/Throat: Mucous membranes are moist.  Oropharynx non-erythematous. Neck: No stridor.   Cardiovascular: Normal rate, regular rhythm. Good peripheral circulation. Grossly normal heart sounds.   Respiratory: Normal respiratory effort.  No retractions. Lungs CTAB. Gastrointestinal: Soft with diffuse mild tenderness to palpation. No rebound or guarding. No distention.  Musculoskeletal: No lower extremity tenderness nor edema. No gross deformities of extremities. Neurologic:  Normal speech  and language. No gross focal neurologic deficits are appreciated.  Skin:  Skin is warm, dry and intact. No rash noted.  ____________________________________________   LABS (all labs ordered are listed, but only abnormal results are displayed)  Labs Reviewed  COMPREHENSIVE METABOLIC PANEL - Abnormal; Notable for the following:       Result Value   Sodium 134 (*)    Potassium 3.3 (*)    Chloride 97 (*)    CO2 16 (*)    Glucose, Bld 228 (*)    BUN 33 (*)    Creatinine, Ser 2.69 (*)    Total Protein 8.5 (*)    GFR calc non Af Amer 22 (*)    GFR calc Af Amer 26 (*)    Anion gap 21 (*)    All other components within normal limits  CBC WITH DIFFERENTIAL/PLATELET - Abnormal; Notable for the following:    WBC 20.6 (*)    RDW 15.8 (*)    Neutro Abs 17.1 (*)    Monocytes Absolute 1.1 (*)    All  other components within normal limits  LIPASE, BLOOD   ____________________________________________  RADIOLOGY  None ____________________________________________   PROCEDURES  Procedure(s) performed:   Procedures  None ____________________________________________   INITIAL IMPRESSION / ASSESSMENT AND PLAN / ED COURSE  Pertinent labs & imaging results that were available during my care of the patient were reviewed by me and considered in my medical decision making (see chart for details).  Patient resents the emergency department for evaluation of abdominal pain with multiple episodes of vomiting. She has had 2 days of symptoms. I reviewed the patient's multiple past ED presentations and admissions. She has a history of chronic abdominal pain, cyclical vomiting, and frequent presentations for dehydration and electrolyte abnormalities. She has not followed with outpatient gastroenterology. She has been evaluated by gastroenterology and general surgery on prior admissions with plan to follow up with outpatient GI. The patient has no focal peritoneal signs to prompt additional abdominal  imaging at this time. I reviewed the patient's ED care plan which seems appropriate. I discussed with the patient that I would not be treating her pain with opiate medications. I ordered Reglan, Protonix, Toradol here in the emergency department along with IV fluids. Plan for screening labs to evaluate for electrolyte abnormality and reassess.    01:45 PM Updated patient. Awaiting labs. Ambulatory to the bathroom without difficulty and observed by nursing staff to be inducing vomiting by sticking fingers down her throat. See associated nursing documentation.   02:00 PM Patient left the emergency Department without alerting nursing staff or myself. Considered to have left AMA. Labs including CMP still in process. Patient left prior to PO challenge and repeat abdominal exam to decide on further imaging. Discussed previously the importance of f/u with outpatient GI as recommended during multiple prior presentations and admissions.  ____________________________________________  FINAL CLINICAL IMPRESSION(S) / ED DIAGNOSES  Final diagnoses:  Cyclical vomiting with nausea, intractability of vomiting not specified  Generalized abdominal pain     MEDICATIONS GIVEN DURING THIS VISIT:  Medications  sodium chloride 0.9 % bolus 1,000 mL (1,000 mLs Intravenous New Bag/Given 06/11/17 1245)  metoCLOPramide (REGLAN) injection 10 mg (10 mg Intravenous Given 06/11/17 1245)  pantoprazole (PROTONIX) injection 40 mg (40 mg Intravenous Given 06/11/17 1245)  ketorolac (TORADOL) 30 MG/ML injection 30 mg (30 mg Intravenous Given 06/11/17 1245)     NEW OUTPATIENT MEDICATIONS STARTED DURING THIS VISIT:  None   Note:  This document was prepared using Dragon voice recognition software and may include unintentional dictation errors.  Nanda Quinton, MD Emergency Medicine    Cigi Bega, [facility], MD 06/11/17 [phone]

## 2017-06-11 NOTE — ED Triage Notes (Signed)
Pt called ems due to diverticulitis. N/v/d x 2 days. Pt came in dry heaving with constant moaning. Was given 9m zofran with ems. N/v "nonstop". No diarhea since 8am.

## 2017-06-13 ENCOUNTER — Encounter (HOSPITAL_COMMUNITY): Payer: Self-pay | Admitting: *Deleted

## 2017-06-13 ENCOUNTER — Emergency Department (HOSPITAL_COMMUNITY): Payer: Self-pay

## 2017-06-13 ENCOUNTER — Observation Stay (HOSPITAL_COMMUNITY)
Admission: EM | Admit: 2017-06-13 | Discharge: 2017-06-14 | DRG: 683 | Disposition: A | Discharge status: Home / Self Care | Payer: Self-pay | Attending: Internal Medicine | Admitting: Internal Medicine

## 2017-06-13 DIAGNOSIS — E860 Dehydration: Secondary | ICD-10-CM | POA: Diagnosis present

## 2017-06-13 DIAGNOSIS — K219 Gastro-esophageal reflux disease without esophagitis: Secondary | ICD-10-CM | POA: Diagnosis present

## 2017-06-13 DIAGNOSIS — N179 Acute kidney failure, unspecified: Principal | ICD-10-CM | POA: Diagnosis present

## 2017-06-13 DIAGNOSIS — R1012 Left upper quadrant pain: Secondary | ICD-10-CM

## 2017-06-13 DIAGNOSIS — Z79899 Other long term (current) drug therapy: Secondary | ICD-10-CM

## 2017-06-13 DIAGNOSIS — K210 Gastro-esophageal reflux disease with esophagitis: Secondary | ICD-10-CM | POA: Diagnosis present

## 2017-06-13 DIAGNOSIS — R112 Nausea with vomiting, unspecified: Secondary | ICD-10-CM | POA: Diagnosis present

## 2017-06-13 DIAGNOSIS — E871 Hypo-osmolality and hyponatremia: Secondary | ICD-10-CM | POA: Diagnosis present

## 2017-06-13 DIAGNOSIS — D72829 Elevated white blood cell count, unspecified: Secondary | ICD-10-CM | POA: Diagnosis present

## 2017-06-13 DIAGNOSIS — F17210 Nicotine dependence, cigarettes, uncomplicated: Secondary | ICD-10-CM | POA: Diagnosis present

## 2017-06-13 DIAGNOSIS — H409 Unspecified glaucoma: Secondary | ICD-10-CM | POA: Diagnosis present

## 2017-06-13 DIAGNOSIS — E876 Hypokalemia: Secondary | ICD-10-CM | POA: Diagnosis present

## 2017-06-13 DIAGNOSIS — G8929 Other chronic pain: Secondary | ICD-10-CM | POA: Diagnosis present

## 2017-06-13 DIAGNOSIS — T39395A Adverse effect of other nonsteroidal anti-inflammatory drugs [NSAID], initial encounter: Secondary | ICD-10-CM | POA: Diagnosis present

## 2017-06-13 DIAGNOSIS — R319 Hematuria, unspecified: Secondary | ICD-10-CM | POA: Diagnosis present

## 2017-06-13 DIAGNOSIS — Z91048 Other nonmedicinal substance allergy status: Secondary | ICD-10-CM

## 2017-06-13 DIAGNOSIS — R1013 Epigastric pain: Secondary | ICD-10-CM | POA: Diagnosis present

## 2017-06-13 DIAGNOSIS — Z885 Allergy status to narcotic agent status: Secondary | ICD-10-CM

## 2017-06-13 DIAGNOSIS — Z881 Allergy status to other antibiotic agents status: Secondary | ICD-10-CM

## 2017-06-13 LAB — URINALYSIS, ROUTINE W REFLEX MICROSCOPIC
Bilirubin Urine: NEGATIVE
GLUCOSE, UA: NEGATIVE mg/dL
KETONES UR: 5 mg/dL — AB
Leukocytes, UA: NEGATIVE
Nitrite: NEGATIVE
PH: 5 (ref 5.0–8.0)
Protein, ur: 100 mg/dL — AB
Specific Gravity, Urine: 1.016 (ref 1.005–1.030)

## 2017-06-13 LAB — COMPREHENSIVE METABOLIC PANEL
ALK PHOS: 56 U/L (ref 38–126)
ALT: 18 U/L (ref 14–54)
AST: 41 U/L (ref 15–41)
Albumin: 4.8 g/dL (ref 3.5–5.0)
Anion gap: 19 — ABNORMAL HIGH (ref 5–15)
BILIRUBIN TOTAL: 1 mg/dL (ref 0.3–1.2)
BUN: 60 mg/dL — ABNORMAL HIGH (ref 6–20)
CALCIUM: 8.6 mg/dL — AB (ref 8.9–10.3)
CO2: 22 mmol/L (ref 22–32)
CREATININE: 3.36 mg/dL — AB (ref 0.44–1.00)
Chloride: 87 mmol/L — ABNORMAL LOW (ref 101–111)
GFR, EST AFRICAN AMERICAN: 20 mL/min — AB (ref >60)
GFR, EST NON AFRICAN AMERICAN: 17 mL/min — AB (ref >60)
Glucose, Bld: 162 mg/dL — ABNORMAL HIGH (ref 65–99)
Potassium: 2.6 mmol/L — CL (ref 3.5–5.1)
Sodium: 128 mmol/L — ABNORMAL LOW (ref 135–145)
TOTAL PROTEIN: 8.2 g/dL — AB (ref 6.5–8.1)

## 2017-06-13 LAB — LIPASE, BLOOD: Lipase: 22 U/L (ref 11–51)

## 2017-06-13 LAB — CBC
HCT: 39.6 % (ref 36.0–46.0)
Hemoglobin: 13.8 g/dL (ref 12.0–15.0)
MCH: 28.9 pg (ref 26.0–34.0)
MCHC: 34.8 g/dL (ref 30.0–36.0)
MCV: 83 fL (ref 78.0–100.0)
PLATELETS: 347 10*3/uL (ref 150–400)
RBC: 4.77 MIL/uL (ref 3.87–5.11)
RDW: 15.3 % (ref 11.5–15.5)
WBC: 18.7 10*3/uL — AB (ref 4.0–10.5)

## 2017-06-13 LAB — MAGNESIUM: Magnesium: 2 mg/dL (ref 1.7–2.4)

## 2017-06-13 LAB — HCG, QUANTITATIVE, PREGNANCY

## 2017-06-13 LAB — TROPONIN I: Troponin I: <0.03 ng/mL (ref <0.03)

## 2017-06-13 MED ORDER — GI COCKTAIL ~~LOC~~
30.0000 mL | Freq: Once | ORAL | Status: AC
  Administered 2017-06-13: 30 mL via ORAL
  Filled 2017-06-13: qty 30

## 2017-06-13 MED ORDER — POTASSIUM CHLORIDE IN NACL 20-0.9 MEQ/L-% IV SOLN
INTRAVENOUS | Status: DC
  Administered 2017-06-13 – 2017-06-14 (×2): via INTRAVENOUS

## 2017-06-13 MED ORDER — LORAZEPAM 2 MG/ML IJ SOLN
0.5000 mg | Freq: Once | INTRAMUSCULAR | Status: AC
  Administered 2017-06-13: 0.5 mg via INTRAVENOUS
  Filled 2017-06-13: qty 1

## 2017-06-13 MED ORDER — POTASSIUM CHLORIDE 10 MEQ/100ML IV SOLN
10.0000 meq | INTRAVENOUS | Status: AC
  Administered 2017-06-13 (×3): 10 meq via INTRAVENOUS
  Filled 2017-06-13 (×3): qty 100

## 2017-06-13 MED ORDER — PANTOPRAZOLE SODIUM 40 MG IV SOLR
40.0000 mg | Freq: Once | INTRAVENOUS | Status: AC
  Administered 2017-06-13: 40 mg via INTRAVENOUS
  Filled 2017-06-13: qty 40

## 2017-06-13 MED ORDER — ONDANSETRON HCL 4 MG/2ML IJ SOLN: 4.0000 mg | Freq: Four times a day (QID) | INTRAMUSCULAR | Status: DC | PRN

## 2017-06-13 MED ORDER — FAMOTIDINE IN NACL 20-0.9 MG/50ML-% IV SOLN
20.0000 mg | Freq: Once | INTRAVENOUS | Status: AC
  Administered 2017-06-13: 20 mg via INTRAVENOUS
  Filled 2017-06-13: qty 50

## 2017-06-13 MED ORDER — PANTOPRAZOLE SODIUM 40 MG IV SOLR
40.0000 mg | Freq: Two times a day (BID) | INTRAVENOUS | Status: DC
  Administered 2017-06-13 – 2017-06-14 (×2): 40 mg via INTRAVENOUS
  Filled 2017-06-13 (×2): qty 40

## 2017-06-13 MED ORDER — LABETALOL HCL 5 MG/ML IV SOLN: 10.0000 mg | Freq: Once | INTRAVENOUS | Status: DC

## 2017-06-13 MED ORDER — METOCLOPRAMIDE HCL 5 MG/ML IJ SOLN
5.0000 mg | Freq: Four times a day (QID) | INTRAMUSCULAR | Status: DC
  Administered 2017-06-13 – 2017-06-14 (×4): 5 mg via INTRAVENOUS
  Filled 2017-06-13 (×4): qty 2

## 2017-06-13 MED ORDER — FENTANYL CITRATE (PF) 100 MCG/2ML IJ SOLN
25.0000 ug | Freq: Once | INTRAMUSCULAR | Status: AC
  Administered 2017-06-13: 25 ug via INTRAVENOUS
  Filled 2017-06-13: qty 2

## 2017-06-13 MED ORDER — POTASSIUM CHLORIDE IN NACL 20-0.9 MEQ/L-% IV SOLN
INTRAVENOUS | Status: AC
  Administered 2017-06-13: 19:00:00 via INTRAVENOUS

## 2017-06-13 MED ORDER — PROMETHAZINE HCL 25 MG/ML IJ SOLN: 12.5000 mg | INTRAMUSCULAR | Status: DC | PRN

## 2017-06-13 MED ORDER — ONDANSETRON HCL 4 MG PO TABS: 4.0000 mg | ORAL_TABLET | Freq: Four times a day (QID) | ORAL | Status: DC | PRN

## 2017-06-13 MED ORDER — FENTANYL CITRATE (PF) 100 MCG/2ML IJ SOLN: 25.0000 ug | INTRAMUSCULAR | Status: DC | PRN

## 2017-06-13 MED ORDER — METOCLOPRAMIDE HCL 5 MG/ML IJ SOLN
10.0000 mg | Freq: Once | INTRAMUSCULAR | Status: AC
  Administered 2017-06-13: 10 mg via INTRAVENOUS
  Filled 2017-06-13: qty 2

## 2017-06-13 MED ORDER — SODIUM CHLORIDE 0.9 % IV BOLUS (SEPSIS)
2000.0000 mL | Freq: Once | INTRAVENOUS | Status: AC
  Administered 2017-06-13: 2000 mL via INTRAVENOUS

## 2017-06-13 MED ORDER — LABETALOL HCL 5 MG/ML IV SOLN: 10.0000 mg | INTRAVENOUS | Status: DC | PRN

## 2017-06-13 NOTE — ED Notes (Signed)
Pt now saying her chest was hurting. EKG completed and given to Dr. Roderic Palau.

## 2017-06-13 NOTE — ED Provider Notes (Signed)
[facility] DEPT Provider Note   CSN: [id number] Arrival date & time: 06/13/17  1113     History   Chief Complaint Chief Complaint  Patient presents with  . Abdominal Pain    HPI [name] is a 31 y.o. female.  HPI Patient presents with left-sided chest pain and "coldness" in the left arm. This started roughly one week ago. Pain is worsened in the last 16 hours. She's also had a 4-5 days of left upper quadrant and epigastric abdominal pain. Associated with multiple episodes of nausea and vomiting. States she's not had a bowel movement in 4-5 days. Has had decreased urinary production but denies dysuria or hematuria. Finishing her period but denies any vaginal discharge. Complains of diffuse myalgias and spasms. Has had decreased oral intake and states she's lost roughly 20 pounds in the last month. Past Medical History:  Diagnosis Date  . Anxiety   . Back pain   . Constipation   . Depression   . GERD (gastroesophageal reflux disease)   . Glaucoma of both eyes   . Hematuria   . History of abnormal cervical Pap smear   . History of chronic gastritis   . History of kidney stones   . Hydronephrosis, right   . Renal calculi    bilateral per ct 0902-2017  . Right ureteral stone   . Urgency of urination   . Uterine fibroid   . Uterine polyp     Patient Active Problem List   Diagnosis Date Noted  . AKI (acute kidney injury) ([place]) 06/13/2017  . Hypomagnesemia 04/29/2017  . Cannabinoid hyperemesis syndrome ([place]) 04/29/2017  . Acute colitis 04/29/2017  . Abdominal pain   . Normochromic normocytic anemia   . Nausea & vomiting 04/02/2017  . Acute renal failure ([place]) 01/16/2017  . Hyponatremia 01/16/2017  . Leukocytosis 01/16/2017  . Nausea and vomiting 01/15/2017  . Chest pain 01/15/2017  . Pyelonephritis 10/11/2016  . Hypokalemia 10/11/2016  . Hyperglycemia 10/11/2016  . Elevated blood pressure reading 10/11/2016  . Left nephrolithiasis 10/11/2016  . Renal  colic on left side 75/17/0017  . Generalized anxiety disorder 10/11/2016  . Nausea with vomiting 07/26/2016  . Ureteral stone 07/04/2016  . UTI (urinary tract infection) 07/04/2016  . Hydronephrosis of right kidney 07/04/2016  . GERD (gastroesophageal reflux disease) 07/04/2016  . Ureteral calculi 07/04/2016  . Abdominal pain, chronic, epigastric   . Hematemesis 05/16/2016  . Heme + stool 05/16/2016  . Abdominal pain, epigastric 05/16/2016    Past Surgical History:  Procedure Laterality Date  . CYSTOSCOPY W/ URETERAL STENT PLACEMENT Right 07/04/2016   Procedure: CYSTOSCOPY WITH RIGHT RETROGRADE PYELOGRAM, RIGHT URETERAL STENT PLACEMENT;  Surgeon: Ardis Hughs, MD;  Location: [facility];  Service: Urology;  Laterality: Right;  . CYSTOSCOPY WITH RETROGRADE PYELOGRAM, URETEROSCOPY AND STENT PLACEMENT Left 11/28/2007  . CYSTOSCOPY WITH RETROGRADE PYELOGRAM, URETEROSCOPY AND STENT PLACEMENT Right 08/02/2016   Procedure: CYSTOSCOPY WITH RIGHT  RETROGRADE PYELOGRAM, URETEROSCOPY,  AND STENT EXCHANGE;  Surgeon: Ardis Hughs, MD;  Location[facility];  Service: Urology;  Laterality: Right;  . ESOPHAGOGASTRODUODENOSCOPY N/A 05/17/2016   Dr. Oneida Alar: LA Grade A esophagitis, mild gastritis s/p biopsy noting reactive gastritis, medium sized hiatal hernia, next EGD WITH PROPOFOL   . INDUCED ABORTION  2009    OB History    Gravida Para Term Preterm AB Living   3 2 2   1 2    SAB TAB Ectopic Multiple Live Births   1  2       Home Medications    Prior to Admission medications   Medication Sig Start Date End Date Taking? Authorizing Provider  naproxen sodium (ANAPROX) 220 MG tablet Take 440 mg by mouth daily as needed (pain).   Yes [provider]  pantoprazole (PROTONIX) 40 MG tablet Take 1 tablet (40 mg total) by mouth 2 (two) times daily. 04/30/17  Yes Samuella Cota, MD  promethazine (PHENERGAN) 25 MG tablet Take 25 mg by mouth every 6 (six) hours as  needed for nausea or vomiting.   Yes [provider]  Alum & Mag Hydroxide-Simeth (GI COCKTAIL) SUSP suspension Take 30 mLs by mouth 3 (three) times daily as needed for indigestion. Shake well. Patient not taking: Reported on 06/11/2017 04/06/17   Caren Griffins, MD  polyethylene glycol (MIRALAX / Floria Raveling) packet Take 17 g by mouth daily. Patient not taking: Reported on 06/11/2017 05/01/17   Samuella Cota, MD  ranitidine (ZANTAC) 150 MG tablet Take 1 tablet (150 mg total) by mouth 2 (two) times daily. Patient not taking: Reported on 06/11/2017 04/06/17   Caren Griffins, MD    Family History Family History  Problem Relation Age of Onset  . Colon cancer Paternal Uncle        Passed away January 17, 2015  . Nephrolithiasis Father   . Hypertension Father   . Nephrolithiasis Sister   . Nephrolithiasis Sister   . Nephrolithiasis Sister   . Hypertension Mother     Social History Social History  Substance Use Topics  . Smoking status: Current Every Day Smoker    Packs/day: 0.50    Years: 20.00    Types: Cigarettes  . Smokeless tobacco: Never Used     Comment: 5-7 cigarettes daily   . Alcohol use 2.4 oz/week    4 Shots of liquor per week     Comment: No ETOH in 4 months; Previously: occasionally, once every 2-3 months.     Allergies   Keflex [cephalexin]; Codeine; Tape; and Vicodin [hydrocodone-acetaminophen]   Review of Systems Review of Systems  Constitutional: Positive for appetite change and fatigue. Negative for chills and fever.  HENT: Negative for sore throat and trouble swallowing.   Eyes: Negative for visual disturbance.  Respiratory: Negative for cough and shortness of breath.   Cardiovascular: Positive for chest pain. Negative for palpitations and leg swelling.  Gastrointestinal: Positive for abdominal pain, constipation, nausea and vomiting. Negative for blood in stool and diarrhea.  Genitourinary: Positive for difficulty urinating and vaginal bleeding. Negative  for dysuria, flank pain, frequency, pelvic pain and vaginal discharge.  Musculoskeletal: Positive for myalgias. Negative for back pain, neck pain and neck stiffness.  Skin: Negative for rash and wound.  Neurological: Positive for weakness (generalized) and numbness. Negative for dizziness, light-headedness and headaches.  Psychiatric/Behavioral: The patient is nervous/anxious.   All other systems reviewed and are negative.    Physical Exam Updated Vital Signs BP (!) 127/95   Pulse 85   Temp 97.8 F (36.6 C) (Oral)   Resp 17   Ht 5' 2"  (1.575 m)   Wt 56.7 kg (125 lb)   LMP 06/11/2017   SpO2 97%   BMI 22.86 kg/m   Physical Exam  Constitutional: She is oriented to person, place, and time. She appears well-developed and well-nourished. She appears distressed.  HENT:  Head: Normocephalic and atraumatic.  Mouth/Throat: Oropharynx is clear and moist. No oropharyngeal exudate.  Eyes: Pupils are equal, round, and reactive to light. EOM  are normal.  Neck: Normal range of motion. Neck supple. No JVD present.  Cardiovascular: Normal rate and regular rhythm.  Exam reveals no gallop and no friction rub.   No murmur heard. Pulmonary/Chest: Effort normal and breath sounds normal. No respiratory distress. She has no wheezes. She has no rales. She exhibits no tenderness.  Abdominal: Soft. Bowel sounds are normal. There is tenderness (left upper quadrant and epigastric tenderness to palpation. No rebound or guarding.). There is no rebound and no guarding.  Musculoskeletal: Normal range of motion. She exhibits no edema or tenderness.  No CVA tenderness bilaterally. No lower extremity swelling, asymmetry or tenderness. Distal pulses are 2+.  Neurological: She is alert and oriented to person, place, and time.  Moving all extremities without focal deficit. Sensation fully intact.  Skin: Skin is warm and dry. No rash noted. No erythema.  Psychiatric: Her behavior is normal.  There is anxious and  tearful.  Nursing note and vitals reviewed.    ED Treatments / Results  Labs (all labs ordered are listed, but only abnormal results are displayed) Labs Reviewed  COMPREHENSIVE METABOLIC PANEL - Abnormal; Notable for the following:       Result Value   Sodium 128 (*)    Potassium 2.6 (*)    Chloride 87 (*)    Glucose, Bld 162 (*)    BUN 60 (*)    Creatinine, Ser 3.36 (*)    Calcium 8.6 (*)    Total Protein 8.2 (*)    GFR calc non Af Amer 17 (*)    GFR calc Af Amer 20 (*)    Anion gap 19 (*)    All other components within normal limits  CBC - Abnormal; Notable for the following:    WBC 18.7 (*)    All other components within normal limits  URINALYSIS, ROUTINE W REFLEX MICROSCOPIC - Abnormal; Notable for the following:    APPearance HAZY (*)    Hgb urine dipstick LARGE (*)    Ketones, ur 5 (*)    Protein, ur 100 (*)    Bacteria, UA RARE (*)    Squamous Epithelial / LPF 0-5 (*)    All other components within normal limits  LIPASE, BLOOD  TROPONIN I  MAGNESIUM  HCG, QUANTITATIVE, PREGNANCY    EKG  EKG Interpretation  Date/Time:  Wednesday June 13 2017 11:37:53 EDT Ventricular Rate:  92 PR Interval:    QRS Duration: 101 QT Interval:  365 QTC Calculation: 452 R Axis:   87 Text Interpretation:  Minimal ST  depression, inferior leads Confirmed by Lita Mains  MD, Ireoluwa Gorsline ([id number]) on 06/13/2017 12:20:11 PM       Radiology Dg Abd Acute W/chest  Result Date: 06/13/2017 CLINICAL DATA:  Left chest and abdominal pain. EXAM: DG ABDOMEN ACUTE W/ 1V CHEST COMPARISON:  CT abdomen and pelvis dated April 29, 2017; abdominal and chest x-ray dated Apr 02, 2017. FINDINGS: There is no evidence of dilated bowel loops or free intraperitoneal air. Unchanged 7 mm left renal calculus. Heart size and mediastinal contours are within normal limits. Both lungs are clear. IMPRESSION: 1. Nonobstructive bowel gas pattern. 2. Unchanged 7 mm left renal calculus. 3. No acute cardiopulmonary disease.  Electronically Signed   By: Titus Dubin M.D.   On: 06/13/2017 15:10    Procedures Procedures (including critical care time)  Medications Ordered in ED Medications  potassium chloride 10 mEq in 100 mL IVPB (10 mEq Intravenous New Bag/Given 06/13/17 1537)  sodium chloride 0.9 %  bolus 2,000 mL (2,000 mLs Intravenous New Bag/Given 06/13/17 1300)  LORazepam (ATIVAN) injection 0.5 mg (0.5 mg Intravenous Given 06/13/17 1301)  metoCLOPramide (REGLAN) injection 10 mg (10 mg Intravenous Given 06/13/17 1300)  pantoprazole (PROTONIX) injection 40 mg (40 mg Intravenous Given 06/13/17 1301)  gi cocktail (Maalox,Lidocaine,Donnatal) (30 mLs Oral Given 06/13/17 1537)     Initial Impression / Assessment and Plan / ED Course  I have reviewed the triage vital signs and the nursing notes.  Pertinent labs & imaging results that were available during my care of the patient were reviewed by me and considered in my medical decision making (see chart for details).     Given IV fluids and potassium replacement. Patient with worsening of her acute renal failure. Discussed with hospitalist who will admit patient.  Final Clinical Impressions(s) / ED Diagnoses   Final diagnoses:  Left upper quadrant pain  Acute renal failure, unspecified acute renal failure type ([place])  Hypokalemia    New Prescriptions New Prescriptions   No medications on file     Julianne Rice, MD 06/13/17 [phone]

## 2017-06-13 NOTE — ED Notes (Signed)
Pt stating she has continued CP. MD made aware.

## 2017-06-13 NOTE — Progress Notes (Signed)
Pt has taken 3 showers since admission. She was moved to room 339 due to shower in 337 flooding the room below hers.

## 2017-06-13 NOTE — H&P (Signed)
History and Physical    GLORINE HANRATTY YNW:[id number] DOB: 12-17-1985 DOA: 06/13/2017  PCP: Patient, No Pcp Per   Patient coming from: Home.  I have personally briefly reviewed patient's old medical records in [place]  Chief Complaint: Abdominal pain, nausea and vomiting.  HPI: KATYA ROLSTON is a 31 y.o. female with medical history significant of anxiety, back pain, constipation, depression, GERD, glaucoma, history of urolithiasis, hematuria, right hydronephrosis, uterine fibroids and polyps, chronic gastritis, chronic abdominal pain who is returning to the emergency department for the second time in 3 days due to complaints of abdominal pain, nausea with multiple episodes of emesis since Saturday. She has been taking naproxen for abdominal pain at home in the last 2 days. She denies fever, chills, eating undercooked, raw or poorly preserved food items. She denies travel history or sick contacts. She states that her last bowel movement was about 5 days ago. She denies melena, hematochezia, dysuria, flank pain, frequency or gross hematuria. She mentions that she did not urinate at all yesterday. She denies pruritus or skin rashes.  ED Course: Initial vital signs temperature 36.6C, heart rate 106, blood pressure 1 54/112 mmHg, respirations 18 and O2 sat 100% on room air. She received the 2000 mL normal saline bolus, Reglan 10 mg IV, lorazepam 0.5 mg IVP, pantoprazole 40 mg IV, potassium 10 mEq 3 IVPB and GI cocktail. She has urinated twice in the emergency department after normal saline bolus was given.  Workup showed WBC of 18.7,  hemoglobin 13.8 g/dL and platelets 347. ( 20.6/14.3/399 on 06/11/2017). HCG was less than 1,  lipase 22 and troponin was normal. EKG shows significant artifact. Sodium 128, potassium 2.6, chloride 87 and bicarbonate 22 mmol/L. BUN was 60, creatinine 3.36, magnesium was 2.0 and glucose 162. Her creatinine level was 2.69 two days ago, 0.75 on 04/30/2017 and 0.91  04/29/2017.   Imaging: A three-way abdomen showed no acute cardiopulmonary disease, nonobstructive bowel gas pattern and an unchanged 7 mm left renal calculus. She has had multiple imaging studies in the part of this chronic abdominal pain workup.  Review of Systems: As per HPI otherwise 10 point review of systems negative.    Past Medical History:  Diagnosis Date  . Anxiety   . Back pain   . Constipation   . Depression   . GERD (gastroesophageal reflux disease)   . Glaucoma of both eyes   . Hematuria   . History of abnormal cervical Pap smear   . History of chronic gastritis   . History of kidney stones   . Hydronephrosis, right   . Renal calculi    bilateral per ct 0902-2017  . Right ureteral stone   . Urgency of urination   . Uterine fibroid   . Uterine polyp     Past Surgical History:  Procedure Laterality Date  . CYSTOSCOPY W/ URETERAL STENT PLACEMENT Right 07/04/2016   Procedure: CYSTOSCOPY WITH RIGHT RETROGRADE PYELOGRAM, RIGHT URETERAL STENT PLACEMENT;  Surgeon: Ardis Hughs, MD;  Location: [facility];  Service: Urology;  Laterality: Right;  . CYSTOSCOPY WITH RETROGRADE PYELOGRAM, URETEROSCOPY AND STENT PLACEMENT Left 11/28/2007  . CYSTOSCOPY WITH RETROGRADE PYELOGRAM, URETEROSCOPY AND STENT PLACEMENT Right 08/02/2016   Procedure: CYSTOSCOPY WITH RIGHT  RETROGRADE PYELOGRAM, URETEROSCOPY,  AND STENT EXCHANGE;  Surgeon: Ardis Hughs, MD;  Location: [facility];  Service: Urology;  Laterality: Right;  . ESOPHAGOGASTRODUODENOSCOPY N/A 05/17/2016   Dr. Oneida Alar: LA Grade A esophagitis, mild gastritis s/p biopsy noting  reactive gastritis, medium sized hiatal hernia, next EGD WITH PROPOFOL   . INDUCED ABORTION  02/08/2008     reports that she has been smoking Cigarettes.  She has a 10.00 pack-year smoking history. She has never used smokeless tobacco. She reports that she drinks about 2.4 oz of alcohol per week . She reports that she uses drugs, including  Marijuana and Barbituates.  Allergies  Allergen Reactions  . Keflex [Cephalexin] Other (See Comments)    Reaction:  Bladder spasms  . Codeine Hives and Itching  . Tape Other (See Comments)    Reaction:  Burning   . Vicodin [Hydrocodone-Acetaminophen] Hives and Itching    Family History  Problem Relation Age of Onset  . Colon cancer Paternal Uncle        Passed away Feb 07, 2015  . Nephrolithiasis Father   . Hypertension Father   . Nephrolithiasis Sister   . Nephrolithiasis Sister   . Nephrolithiasis Sister   . Hypertension Mother     Prior to Admission medications   Medication Sig Start Date End Date Taking? Authorizing Provider  naproxen sodium (ANAPROX) 220 MG tablet Take 440 mg by mouth daily as needed (pain).   Yes [provider]  pantoprazole (PROTONIX) 40 MG tablet Take 1 tablet (40 mg total) by mouth 2 (two) times daily. 04/30/17  Yes Samuella Cota, MD  promethazine (PHENERGAN) 25 MG tablet Take 25 mg by mouth every 6 (six) hours as needed for nausea or vomiting.   Yes [provider]  Alum & Mag Hydroxide-Simeth (GI COCKTAIL) SUSP suspension Take 30 mLs by mouth 3 (three) times daily as needed for indigestion. Shake well. Patient not taking: Reported on 06/11/2017 04/06/17   Caren Griffins, MD  polyethylene glycol (MIRALAX / Floria Raveling) packet Take 17 g by mouth daily. Patient not taking: Reported on 06/11/2017 05/01/17   Samuella Cota, MD  ranitidine (ZANTAC) 150 MG tablet Take 1 tablet (150 mg total) by mouth 2 (two) times daily. Patient not taking: Reported on 06/11/2017 04/06/17   Caren Griffins, MD    Physical Exam: Vitals:   06/13/17 1230 06/13/17 1330 06/13/17 1400 06/13/17 1430  BP: (!) 168/105 (!) 143/97 128/78 (!) 127/95  Pulse: (!) 107 80 89 85  Resp: (!) 26 18 18 17   Temp:      TempSrc:      SpO2: 98% 100% 98% 97%  Weight:      Height:        Constitutional: NAD, calm, comfortable. Eyes: PERRL, lids and conjunctivae  normal ENMT: Mucous membranes are dry. Posterior pharynx clear of any exudate or lesions. Neck: normal, supple, no masses, no thyromegaly Respiratory: clear to auscultation bilaterally, no wheezing, no crackles. Normal respiratory effort. No accessory muscle use.  Cardiovascular: Regular rate and rhythm, no murmurs / rubs / gallops. No extremity edema. 2+ pedal pulses. No carotid bruits.  Abdomen: Soft, positive epigastric, LUQ and suprapubic tenderness, no guarding/rebound/masses palpated. No hepatosplenomegaly. Bowel sounds positive.  Musculoskeletal: no clubbing / cyanosis. Good ROM, no contractures. Normal muscle tone.  Skin: no significant rashes, lesions, ulcers on limited skin exam Neurologic: CN 2-12 grossly intact. Sensation intact, DTR normal. Strength 5/5 in all 4.  Psychiatric: Normal judgment and insight. Alert and oriented x 4. Normal mood.    Labs on Admission: I have personally reviewed following labs and imaging studies  CBC:  Recent Labs Lab 06/11/17 1258 06/13/17 1133  WBC 20.6* 18.7*  NEUTROABS 17.1*  --   HGB 14.3  13.8  HCT 42.7 39.6  MCV 85.9 83.0  PLT 399 893   Basic Metabolic Panel:  Recent Labs Lab 06/11/17 1258 06/13/17 1133  NA 134* 128*  K 3.3* 2.6*  CL 97* 87*  CO2 16* 22  GLUCOSE 228* 162*  BUN 33* 60*  CREATININE 2.69* 3.36*  CALCIUM 9.4 8.6*  MG  --  2.0   GFR: Estimated Creatinine Clearance: 18.8 mL/min (A) (by C-G formula based on SCr of 3.36 mg/dL (H)). Liver Function Tests:  Recent Labs Lab 06/11/17 1258 06/13/17 1133  AST 32 41  ALT 22 18  ALKPHOS 67 56  BILITOT 1.1 1.0  PROT 8.5* 8.2*  ALBUMIN 5.0 4.8    Recent Labs Lab 06/11/17 1258 06/13/17 1133  LIPASE 19 22   No results for input(s): AMMONIA in the last 168 hours. Coagulation Profile: No results for input(s): INR, PROTIME in the last 168 hours. Cardiac Enzymes:  Recent Labs Lab 06/13/17 1138  TROPONINI <0.03   BNP (last 3 results) No results for  input(s): PROBNP in the last 8760 hours. HbA1C: No results for input(s): HGBA1C in the last 72 hours. CBG: No results for input(s): GLUCAP in the last 168 hours. Lipid Profile: No results for input(s): CHOL, HDL, LDLCALC, TRIG, CHOLHDL, LDLDIRECT in the last 72 hours. Thyroid Function Tests: No results for input(s): TSH, T4TOTAL, FREET4, T3FREE, THYROIDAB in the last 72 hours. Anemia Panel: No results for input(s): VITAMINB12, FOLATE, FERRITIN, TIBC, IRON, RETICCTPCT in the last 72 hours. Urine analysis:    Component Value Date/Time   COLORURINE YELLOW 06/13/2017 1122   APPEARANCEUR HAZY (A) 06/13/2017 1122   LABSPEC 1.016 06/13/2017 1122   PHURINE 5.0 06/13/2017 1122   GLUCOSEU NEGATIVE 06/13/2017 1122   HGBUR LARGE (A) 06/13/2017 1122   BILIRUBINUR NEGATIVE 06/13/2017 1122   KETONESUR 5 (A) 06/13/2017 1122   PROTEINUR 100 (A) 06/13/2017 1122   UROBILINOGEN 0.2 07/02/2015 1757   NITRITE NEGATIVE 06/13/2017 1122   LEUKOCYTESUR NEGATIVE 06/13/2017 1122    Radiological Exams on Admission: Dg Abd Acute W/chest  Result Date: 06/13/2017 CLINICAL DATA:  Left chest and abdominal pain. EXAM: DG ABDOMEN ACUTE W/ 1V CHEST COMPARISON:  CT abdomen and pelvis dated April 29, 2017; abdominal and chest x-ray dated Apr 02, 2017. FINDINGS: There is no evidence of dilated bowel loops or free intraperitoneal air. Unchanged 7 mm left renal calculus. Heart size and mediastinal contours are within normal limits. Both lungs are clear. IMPRESSION: 1. Nonobstructive bowel gas pattern. 2. Unchanged 7 mm left renal calculus. 3. No acute cardiopulmonary disease. Electronically Signed   By: Titus Dubin M.D.   On: 06/13/2017 15:10    EKG: Independently reviewed. Vent. rate 92 BPM PR interval * ms QRS duration 101 ms QT/QTc 365/452 ms P-R-T axes * 87 29 Minimal ST depression, inferior leads  Assessment/Plan Principal Problem:   AKI (acute kidney injury) ([place]) Admit to telemetry  a/inpatient. Continue IV hydration. Monitor intake and output. Check renal ultrasound in a.m. Monitor renal function and electrolytes. Consider expanding workup and/or nephrology evaluation if no improvement.  Active Problems:   Abdominal pain, chronic, epigastric Keep nothing by mouth. Continue IV hydration. Strongly advised to avoid NSAIDs given her history. Continue pantoprazole 40 mg IVP every 12 hours. Single dose famotidine 20 mg IVP given in the ED. Consider trial of clear liquids in a.m. if pain is improved. EGD by Dr. Oneida Alar last year showed inflammation and erythema and erosions in the stomach. Patient may benefit from repeat  EGD as an in or outpatient.    GERD (gastroesophageal reflux disease) Order a single dose of famotidine 20 mg IVP in the emergency department. Continue pantoprazole IV twice a day while Continue IV hydration.    Nausea with vomiting Continue IV hydration. Continue antiemetics as needed. Continue PPI and/or H2 blockers.    Hypokalemia Likely due to emesis and decreased oral intake. Replacing. Follow-up potassium level in a.m.    Hyponatremia Likely due to GI losses and decreased oral intake. Currently replacing. Follow-up sodium level in a.m.    Leukocytosis It seems that the patient has a high white blood cell count at baseline. There might be some degree of hemoconcentration as well.    Hematuria Per patient, and she is currently having her menstrual period. Patient to continue to follow-up with PCP for this issue.    DVT prophylaxis: SCDs. Code Status: Full code. Family Communication: Disposition Plan: Admit for 2-3 days for IV hydration, renal function/electrolytes monitoring and further workup. Consults called:  Admission status: Inpatient/telemetry.   Reubin Milan MD [facility] Pager [phone].  If 7PM-7AM, please contact night-coverage [email or web address] Password [facility]  06/13/2017, 3:55 PM

## 2017-06-13 NOTE — ED Triage Notes (Signed)
Pt comes in by EMS for abdominal pain starting 4 days ago. Pt triaged here but left AMA. She has hx of diverticulitis. Pt has IV established by EMS and has been given 4 mg of Zofran

## 2017-06-13 NOTE — ED Notes (Signed)
CRITICAL VALUE ALERT  Critical Value:  Potassium 2.6  Date & Time Notied:  06/13/17 at 1220  Provider Notified: Lita Mains  Orders Received/Actions taken: MD made aware.

## 2017-06-14 ENCOUNTER — Inpatient Hospital Stay (HOSPITAL_COMMUNITY): Payer: Self-pay

## 2017-06-14 DIAGNOSIS — R1013 Epigastric pain: Secondary | ICD-10-CM

## 2017-06-14 DIAGNOSIS — E871 Hypo-osmolality and hyponatremia: Secondary | ICD-10-CM

## 2017-06-14 DIAGNOSIS — N179 Acute kidney failure, unspecified: Secondary | ICD-10-CM

## 2017-06-14 DIAGNOSIS — G8929 Other chronic pain: Secondary | ICD-10-CM

## 2017-06-14 DIAGNOSIS — D72829 Elevated white blood cell count, unspecified: Secondary | ICD-10-CM

## 2017-06-14 DIAGNOSIS — R1012 Left upper quadrant pain: Secondary | ICD-10-CM

## 2017-06-14 DIAGNOSIS — K210 Gastro-esophageal reflux disease with esophagitis: Secondary | ICD-10-CM

## 2017-06-14 DIAGNOSIS — R112 Nausea with vomiting, unspecified: Secondary | ICD-10-CM

## 2017-06-14 DIAGNOSIS — E876 Hypokalemia: Secondary | ICD-10-CM

## 2017-06-14 LAB — BASIC METABOLIC PANEL
Anion gap: 7 (ref 5–15)
BUN: 32 mg/dL — ABNORMAL HIGH (ref 6–20)
CO2: 24 mmol/L (ref 22–32)
Calcium: 8.2 mg/dL — ABNORMAL LOW (ref 8.9–10.3)
Chloride: 102 mmol/L (ref 101–111)
Creatinine, Ser: 1.01 mg/dL — ABNORMAL HIGH (ref 0.44–1.00)
GFR calc Af Amer: >60 mL/min (ref >60)
GLUCOSE: 87 mg/dL (ref 65–99)
POTASSIUM: 3.5 mmol/L (ref 3.5–5.1)
Sodium: 133 mmol/L — ABNORMAL LOW (ref 135–145)

## 2017-06-14 LAB — CBC WITH DIFFERENTIAL/PLATELET
Basophils Absolute: 0 10*3/uL (ref 0.0–0.1)
Basophils Relative: 0 %
EOS PCT: 0 %
Eosinophils Absolute: 0 10*3/uL (ref 0.0–0.7)
HCT: 30.7 % — ABNORMAL LOW (ref 36.0–46.0)
Hemoglobin: 10.2 g/dL — ABNORMAL LOW (ref 12.0–15.0)
LYMPHS ABS: 3.5 10*3/uL (ref 0.7–4.0)
LYMPHS PCT: 42 %
MCH: 28.3 pg (ref 26.0–34.0)
MCHC: 33.2 g/dL (ref 30.0–36.0)
MCV: 85.3 fL (ref 78.0–100.0)
MONO ABS: 1 10*3/uL (ref 0.1–1.0)
Monocytes Relative: 12 %
Neutro Abs: 3.9 10*3/uL (ref 1.7–7.7)
Neutrophils Relative %: 47 %
PLATELETS: 230 10*3/uL (ref 150–400)
RBC: 3.6 MIL/uL — AB (ref 3.87–5.11)
RDW: 15.6 % — ABNORMAL HIGH (ref 11.5–15.5)
WBC: 8.5 10*3/uL (ref 4.0–10.5)

## 2017-06-14 NOTE — Discharge Summary (Signed)
Physician Discharge Summary  Jeanne Mcneil UGQ:[id number] DOB: November 01, 1986 DOA: 06/13/2017  PCP: Patient, No Pcp Per  Admit date: 06/13/2017 Discharge date: 06/14/2017  Time spent: 45 minutes  Recommendations for Outpatient Follow-up:  -Will be discharged home today. -Advised to follow-up with primary care provider in 2 weeks at which time her renal function will need to be monitored. -I have advised that she stop using NSAIDs indefinitely.   Discharge Diagnoses:  Principal Problem:   AKI (acute kidney injury) ([place]) Active Problems:   Abdominal pain, chronic, epigastric   GERD (gastroesophageal reflux disease)   Nausea with vomiting   Hypokalemia   Hyponatremia   Leukocytosis   Hematuria   Discharge Condition: Stable and improved  Filed Weights   06/13/17 1118  Weight: 56.7 kg (125 lb)    History of present illness:  As per Dr. Olevia Bowens on 7/25: Jeanne Mcneil is a 31 y.o. female with medical history significant of anxiety, back pain, constipation, depression, GERD, glaucoma, history of urolithiasis, hematuria, right hydronephrosis, uterine fibroids and polyps, chronic gastritis, chronic abdominal pain who is returning to the emergency department for the second time in 3 days due to complaints of abdominal pain, nausea with multiple episodes of emesis since Saturday. She has been taking naproxen for abdominal pain at home in the last 2 days. She denies fever, chills, eating undercooked, raw or poorly preserved food items. She denies travel history or sick contacts. She states that her last bowel movement was about 5 days ago. She denies melena, hematochezia, dysuria, flank pain, frequency or gross hematuria. She mentions that she did not urinate at all yesterday. She denies pruritus or skin rashes.  ED Course: Initial vital signs temperature 36.6C, heart rate 106, blood pressure 1 54/112 mmHg, respirations 18 and O2 sat 100% on room air. She received the 2000 mL normal saline  bolus, Reglan 10 mg IV, lorazepam 0.5 mg IVP, pantoprazole 40 mg IV, potassium 10 mEq 3 IVPB and GI cocktail. She has urinated twice in the emergency department after normal saline bolus was given.  Workup showed WBC of 18.7,  hemoglobin 13.8 g/dL and platelets 347. ( 20.6/14.3/399 on 06/11/2017). HCG was less than 1,  lipase 22 and troponin was normal. EKG shows significant artifact. Sodium 128, potassium 2.6, chloride 87 and bicarbonate 22 mmol/L. BUN was 60, creatinine 3.36, magnesium was 2.0 and glucose 162. Her creatinine level was 2.69 two days ago, 0.75 on 04/30/2017 and 0.91 04/29/2017.   Imaging: A three-way abdomen showed no acute cardiopulmonary disease, nonobstructive bowel gas pattern and an unchanged 7 mm left renal calculus. She has had multiple imaging studies in the part of this chronic abdominal pain workup.  Hospital Course:   Acute renal failure -Creatinine is normal today from above 3 on admission yesterday. -Likely due to dehydration and prerenal azotemia on top of possible NSAID effect.   Nausea/vomiting/abdominal pain -Resolved, tolerating solid diet without issues, etiology unclear.    Rest chronic conditions have been stable  Procedures:  None   Consultations:  None  Discharge Instructions  Discharge Instructions    Increase activity slowly    Complete by:  As directed      Allergies as of 06/14/2017      Reactions   Keflex [cephalexin] Other (See Comments)   Reaction:  Bladder spasms   Codeine Hives, Itching   Tape Other (See Comments)   Reaction:  Burning    Vicodin [hydrocodone-acetaminophen] Hives, Itching      Medication  List    STOP taking these medications   gi cocktail Susp suspension   naproxen sodium 220 MG tablet Commonly known as:  ANAPROX   polyethylene glycol packet Commonly known as:  MIRALAX / GLYCOLAX   ranitidine 150 MG tablet Commonly known as:  ZANTAC     TAKE these medications   pantoprazole 40 MG  tablet Commonly known as:  PROTONIX Take 1 tablet (40 mg total) by mouth 2 (two) times daily.   promethazine 25 MG tablet Commonly known as:  PHENERGAN Take 25 mg by mouth every 6 (six) hours as needed for nausea or vomiting.      Allergies  Allergen Reactions  . Keflex [Cephalexin] Other (See Comments)    Reaction:  Bladder spasms  . Codeine Hives and Itching  . Tape Other (See Comments)    Reaction:  Burning   . Vicodin [Hydrocodone-Acetaminophen] Hives and Itching   Follow-up [place] Follow up.   Why:  call for hospital follow up Contact information: [address] [place] [phone]           The results of significant diagnostics from this hospitalization (including imaging, microbiology, ancillary and laboratory) are listed below for reference.    Significant Diagnostic Studies: US Renal  Result Date: 06/14/2017 CLINICAL DATA:  Acute renal injury, 3 days of abdominal pain with nausea and vomiting. History of kidney stones. EXAM: RENAL / URINARY TRACT ULTRASOUND COMPLETE COMPARISON:  Renal ultrasound of Apr 02, 2017 FINDINGS: Right Kidney: Length: 11.4 cm. The renal cortical echotexture is approximately equal to that of the adjacent liver. There is no hydronephrosis, or focal mass, nor evidence of stones. Left Kidney: Length: 11.9 cm. The cortical echotexture is similar to that on the right. There is a midpole stone measuring 5 mm in diameter and a slightly smaller mid upper pole stone. There is no hydronephrosis. A previously demonstrated complex cystic structure in the medial cortex of the upper pole is not well demonstrated on today's study. Bladder: Appears normal for degree of bladder distention. Bilateral ureteral jets are observed. IMPRESSION: Mildly increased renal cortical echotexture compatible with medical renal disease. There is no hydronephrosis. Subcentimeter nonobstructing  stones are noted on the left. A previously described complex cystic structure in the medial aspect of the upper pole cortex of the left kidney is not clearly evident on today's study. Electronically Signed   By: David  Martinique M.D.   On: 06/14/2017 08:01   Dg Abd Acute W/chest  Result Date: 06/13/2017 CLINICAL DATA:  Left chest and abdominal pain. EXAM: DG ABDOMEN ACUTE W/ 1V CHEST COMPARISON:  CT abdomen and pelvis dated April 29, 2017; abdominal and chest x-ray dated Apr 02, 2017. FINDINGS: There is no evidence of dilated bowel loops or free intraperitoneal air. Unchanged 7 mm left renal calculus. Heart size and mediastinal contours are within normal limits. Both lungs are clear. IMPRESSION: 1. Nonobstructive bowel gas pattern. 2. Unchanged 7 mm left renal calculus. 3. No acute cardiopulmonary disease. Electronically Signed   By: Titus Dubin M.D.   On: 06/13/2017 15:10    Microbiology: No results found for this or any previous visit (from the past 240 hour(s)).   Labs: Basic Metabolic Panel:  Recent Labs Lab 06/11/17 1258 06/13/17 1133 06/14/17 0406  NA 134* 128* 133*  K 3.3* 2.6* 3.5  CL 97* 87* 102  CO2 16* 22 24  GLUCOSE 228* 162* 87  BUN 33* 60*  32*  CREATININE 2.69* 3.36* 1.01*  CALCIUM 9.4 8.6* 8.2*  MG  --  2.0  --    Liver Function Tests:  Recent Labs Lab 06/11/17 1258 06/13/17 1133  AST 32 41  ALT 22 18  ALKPHOS 67 56  BILITOT 1.1 1.0  PROT 8.5* 8.2*  ALBUMIN 5.0 4.8    Recent Labs Lab 06/11/17 1258 06/13/17 1133  LIPASE 19 22   No results for input(s): AMMONIA in the last 168 hours. CBC:  Recent Labs Lab 06/11/17 1258 06/13/17 1133 06/14/17 0406  WBC 20.6* 18.7* 8.5  NEUTROABS 17.1*  --  3.9  HGB 14.3 13.8 10.2*  HCT 42.7 39.6 30.7*  MCV 85.9 83.0 85.3  PLT 399 347 230   Cardiac Enzymes:  Recent Labs Lab 06/13/17 1138  TROPONINI <0.03   BNP: BNP (last 3 results) No results for input(s): BNP in the last 8760 hours.  ProBNP (last 3  results) No results for input(s): PROBNP in the last 8760 hours.  CBG: No results for input(s): GLUCAP in the last 168 hours.     SignedLelon Frohlich  [facility] Pager: [phone] 06/14/2017, 3:29 PM

## 2017-06-14 NOTE — Consult Note (Signed)
REVIEWED. AVOID NSAIDS.   Referring Provider: No ref. provider found Primary Care Physician:  Patient, No Pcp Per Primary Gastroenterologist:  Dr. Oneida Alar  Date of Admission: 06/13/17 Date of Consultation: 06/14/17  Reason for Consultation:  Acute on chronic abdominal pain  HPI:  Jeanne Mcneil is a 31 y.o. female with a past medical history of anxiety, back pain, constipation, depression, GERD, glaucoma, history of urolithiasis, hematuria, right hydronephrosis, uterine fibroids and polyps, chronic gastritis, chronic abdominal pain who is returning to the emergency department for the second time in 3 days due to complaints of abdominal pain, nausea with multiple episodes of emesis since Saturday. Per hospitalist admission note, the patient has been taking NSAIDs for abdominal pain for the previous 2 days. Denies fever and sick contacts.  In the emergency department her lipase was normal, CMP showed hypokalemia with potassium of 2.6, sodium low at 128, normal liver function, acute kidney injury with a creatinine of 3.36. CBC showed leukocytosis with a white blood cell count of 18.7, although she does typically chronically have an elevated white blood cell count. Platelets were normal at 347, hemoglobin normal at 13.8. She was given fluid boluses and medications. Admitted for further workup and treatment.  Today her white blood cell count normalized at 8.5, hemoglobin declined 10.2, platelets declined to 230. This is consistent with hemoconcentration and status post delusional effect with fluid resuscitation. Her creatinine significantly improved to 1.01, potassium normalized 3.5, sodium improved to 133.  Today she states she is feeling great. States she had a bowel movement that morning and denies hematochezia or melena. No further abdominal pain, no nausea, no vomiting. She denies any further GI complaint. No chest pain, shortness of breath, dizziness, lightheadedness, syncope, near syncope.  Past  Medical History:  Diagnosis Date  . Anxiety   . Back pain   . Constipation   . Depression   . GERD (gastroesophageal reflux disease)   . Glaucoma of both eyes   . Hematuria   . History of abnormal cervical Pap smear   . History of chronic gastritis   . History of kidney stones   . Hydronephrosis, right   . Renal calculi    bilateral per ct 0902-2017  . Right ureteral stone   . Urgency of urination   . Uterine fibroid   . Uterine polyp     Past Surgical History:  Procedure Laterality Date  . CYSTOSCOPY W/ URETERAL STENT PLACEMENT Right 07/04/2016   Procedure: CYSTOSCOPY WITH RIGHT RETROGRADE PYELOGRAM, RIGHT URETERAL STENT PLACEMENT;  Surgeon: Ardis Hughs, MD;  Location: [facility];  Service: Urology;  Laterality: Right;  . CYSTOSCOPY WITH RETROGRADE PYELOGRAM, URETEROSCOPY AND STENT PLACEMENT Left 11/28/2007  . CYSTOSCOPY WITH RETROGRADE PYELOGRAM, URETEROSCOPY AND STENT PLACEMENT Right 08/02/2016   Procedure: CYSTOSCOPY WITH RIGHT  RETROGRADE PYELOGRAM, URETEROSCOPY,  AND STENT EXCHANGE;  Surgeon: Ardis Hughs, MD;  Location: [facility];  Service: Urology;  Laterality: Right;  . ESOPHAGOGASTRODUODENOSCOPY N/A 05/17/2016   Dr. Oneida Alar: LA Grade A esophagitis, mild gastritis s/p biopsy noting reactive gastritis, medium sized hiatal hernia, next EGD WITH PROPOFOL   . INDUCED ABORTION  2009    Prior to Admission medications   Medication Sig Start Date End Date Taking? Authorizing Provider  naproxen sodium (ANAPROX) 220 MG tablet Take 440 mg by mouth daily as needed (pain).   Yes [provider]  pantoprazole (PROTONIX) 40 MG tablet Take 1 tablet (40 mg total) by mouth 2 (two) times daily. 04/30/17  Yes Samuella Cota, MD  promethazine (PHENERGAN) 25 MG tablet Take 25 mg by mouth every 6 (six) hours as needed for nausea or vomiting.   Yes [provider]  Alum & Mag Hydroxide-Simeth (GI COCKTAIL) SUSP suspension Take 30 mLs by mouth 3  (three) times daily as needed for indigestion. Shake well. Patient not taking: Reported on 06/11/2017 04/06/17   Caren Griffins, MD  polyethylene glycol (MIRALAX / Floria Raveling) packet Take 17 g by mouth daily. Patient not taking: Reported on 06/11/2017 05/01/17   Samuella Cota, MD  ranitidine (ZANTAC) 150 MG tablet Take 1 tablet (150 mg total) by mouth 2 (two) times daily. Patient not taking: Reported on 06/11/2017 04/06/17   Caren Griffins, MD    Current Facility-Administered Medications  Medication Dose Route Frequency Provider Last Rate Last Dose  . 0.9 % NaCl with KCl 20 mEq/ L  infusion   Intravenous Continuous Reubin Milan, MD 125 mL/hr at 06/14/17 [id number]    . fentaNYL (SUBLIMAZE) injection 25 mcg  25 mcg Intravenous Q4H PRN Reubin Milan, MD      . labetalol (NORMODYNE,TRANDATE) injection 10 mg  10 mg Intravenous Q2H PRN Reubin Milan, MD      . labetalol (NORMODYNE,TRANDATE) injection 10 mg  10 mg Intravenous Once Reubin Milan, MD      . metoCLOPramide [facility]) injection 5 mg  5 mg Intravenous Q6H Reubin Milan, MD   5 mg at 06/14/17 2694  . ondansetron (ZOFRAN) tablet 4 mg  4 mg Oral Q6H PRN Reubin Milan, MD       Or  . ondansetron [facility]) injection 4 mg  4 mg Intravenous Q6H PRN Reubin Milan, MD      . pantoprazole (PROTONIX) injection 40 mg  40 mg Intravenous Q12H Reubin Milan, MD   40 mg at 06/14/17 0810  . promethazine (PHENERGAN) injection 12.5 mg  12.5 mg Intravenous Q4H PRN Reubin Milan, MD        Allergies as of 06/13/2017 - Review Complete 06/13/2017  Allergen Reaction Noted  . Keflex [cephalexin] Other (See Comments) 07/25/2016  . Codeine Hives and Itching 05/17/2013  . Tape Other (See Comments) 08/02/2016  . Vicodin [hydrocodone-acetaminophen] Hives and Itching 09/16/2012    Family History  Problem Relation Age of Onset  . Colon cancer Paternal Uncle        Passed away 27-Jan-2015  . Nephrolithiasis Father    . Hypertension Father   . Nephrolithiasis Sister   . Nephrolithiasis Sister   . Nephrolithiasis Sister   . Hypertension Mother     Social History   Social History  . Marital status: Single    Spouse name: N/A  . Number of children: 2  . Years of education: 11   Occupational History  . Unemployed    Social History Main Topics  . Smoking status: Current Every Day Smoker    Packs/day: 0.50    Years: 20.00    Types: Cigarettes  . Smokeless tobacco: Never Used     Comment: 5-7 cigarettes daily   . Alcohol use 2.4 oz/week    4 Shots of liquor per week     Comment: No ETOH in 4 months; Previously: occasionally, once every 2-3 months.  . Drug use: Yes    Types: Marijuana, Barbituates     Comment: "Only when I feel nauseated and can't function"  . Sexual activity: Yes    Birth control/ protection: None  Other Topics Concern  . Not on file   Social History Narrative   Lives with her father and step-mother. Unemployed.    Review of Systems: General: Negative for anorexia, weight loss, fever, chills, fatigue, weakness. ENT: Negative for hoarseness, difficulty swallowing. CV: Negative for chest pain, angina, palpitations, peripheral edema.  Respiratory: Negative for dyspnea at rest, cough, sputum, wheezing.  GI: See history of present illness. Derm: Negative for rash or itching.  Endo: Negative for unusual weight change.  Heme: Negative for bruising or bleeding.  Physical Exam: Vital signs in last 24 hours: Temp:  [97.8 F (36.6 C)-98.6 F (37 C)] 98.3 F (36.8 C) (07/26 0546) Pulse Rate:  [55-107] 55 (07/26 0546) Resp:  [17-26] 18 (07/26 0546) BP: (100-174)/(56-140) 100/56 (07/26 0546) SpO2:  [97 %-100 %] 99 % (07/26 0546) Weight:  [125 lb (56.7 kg)] 125 lb (56.7 kg) (07/25 1118) Last BM Date: 06/09/17 General:   Alert, Well-developed, well-nourished, pleasant and cooperative in NAD Head:  Normocephalic and atraumatic. Eyes:  Sclera clear, no icterus.  Conjunctiva pink. Ears:  Normal auditory acuity. Neck:  Supple; no masses or thyromegaly. Lungs:  Clear throughout to auscultation.  No wheezes, crackles, or rhonchi. No acute distress. Heart:  Regular rate and rhythm; no murmurs, clicks, rubs,  or gallops. Abdomen:  Soft, nontender and nondistended. No masses, hepatosplenomegaly or hernias noted. Normal bowel sounds, without guarding, and without rebound.   Rectal:  Deferred   Msk:  Symmetrical without gross deformities. Pulses:  Normal bilateral DP pulses noted. Extremities:  Without clubbing or edema. Neurologic:  Alert and  oriented x4;  grossly normal neurologically. Psych:  Alert and cooperative. Normal mood and affect.  Intake/Output from previous day: 07/25 0701 - 07/26 0700 In: 2906.3 [I.V.:706.3; IV Piggyback:2200] Out: -  Intake/Output this shift: No intake/output data recorded.  Lab Results:  Recent Labs  06/11/17 1258 06/13/17 1133 06/14/17 0406  WBC 20.6* 18.7* 8.5  HGB 14.3 13.8 10.2*  HCT 42.7 39.6 30.7*  PLT 399 347 230   BMET  Recent Labs  06/11/17 1258 06/13/17 1133 06/14/17 0406  NA 134* 128* 133*  K 3.3* 2.6* 3.5  CL 97* 87* 102  CO2 16* 22 24  GLUCOSE 228* 162* 87  BUN 33* 60* 32*  CREATININE 2.69* 3.36* 1.01*  CALCIUM 9.4 8.6* 8.2*   LFT  Recent Labs  06/11/17 1258 06/13/17 1133  PROT 8.5* 8.2*  ALBUMIN 5.0 4.8  AST 32 41  ALT 22 18  ALKPHOS 67 56  BILITOT 1.1 1.0   PT/INR No results for input(s): LABPROT, INR in the last 72 hours. Hepatitis Panel No results for input(s): HEPBSAG, HCVAB, HEPAIGM, HEPBIGM in the last 72 hours. C-Diff No results for input(s): CDIFFTOX in the last 72 hours.  Studies/Results: US Renal  Result Date: 06/14/2017 CLINICAL DATA:  Acute renal injury, 3 days of abdominal pain with nausea and vomiting. History of kidney stones. EXAM: RENAL / URINARY TRACT ULTRASOUND COMPLETE COMPARISON:  Renal ultrasound of Apr 02, 2017 FINDINGS: Right Kidney: Length:  11.4 cm. The renal cortical echotexture is approximately equal to that of the adjacent liver. There is no hydronephrosis, or focal mass, nor evidence of stones. Left Kidney: Length: 11.9 cm. The cortical echotexture is similar to that on the right. There is a midpole stone measuring 5 mm in diameter and a slightly smaller mid upper pole stone. There is no hydronephrosis. A previously demonstrated complex cystic structure in the medial cortex of the upper pole is not  well demonstrated on today's study. Bladder: Appears normal for degree of bladder distention. Bilateral ureteral jets are observed. IMPRESSION: Mildly increased renal cortical echotexture compatible with medical renal disease. There is no hydronephrosis. Subcentimeter nonobstructing stones are noted on the left. A previously described complex cystic structure in the medial aspect of the upper pole cortex of the left kidney is not clearly evident on today's study. Electronically Signed   By: David  Martinique M.D.   On: 06/14/2017 08:01   Dg Abd Acute W/chest  Result Date: 06/13/2017 CLINICAL DATA:  Left chest and abdominal pain. EXAM: DG ABDOMEN ACUTE W/ 1V CHEST COMPARISON:  CT abdomen and pelvis dated April 29, 2017; abdominal and chest x-ray dated Apr 02, 2017. FINDINGS: There is no evidence of dilated bowel loops or free intraperitoneal air. Unchanged 7 mm left renal calculus. Heart size and mediastinal contours are within normal limits. Both lungs are clear. IMPRESSION: 1. Nonobstructive bowel gas pattern. 2. Unchanged 7 mm left renal calculus. 3. No acute cardiopulmonary disease. Electronically Signed   By: Titus Dubin M.D.   On: 06/13/2017 15:10    Impression: 31 year old female well-known to our service with frequent ER visits and admissions for abdominal pain, nausea, vomiting. She admitted this time with acute on chronic abdominal pain as well as acute kidney injury with an initial creatinine of 3.36. She was treated with anxiety  medicines, pain medicines, and 2 L bolus with maintenance IV fluids. Her creatinine improved to 1.01. Her hemoglobin did declined slightly, although no noted GI bleed likely delusional effect resulting and declined hemoglobin. Today she feels great. She had a rapid turn around with fluids and medications. She is wanting to go home. She is sitting in the bed talking on the phone when I entered.  She likely had acute kidney injury and dehydration which could've worsened her abdominal pain. This rapidly resolved with fluids and medications.  Plan: 1. Supportive measures 2. Appears appropriate for discharge from GI standpoint   Thank you for allowing [place] to participate in the care of [place], DNP, AGNP-C Adult & Gerontological Nurse Practitioner [facility]    LOS: 1 day     06/14/2017, 9:14 AM

## 2017-06-14 NOTE — Care Management Note (Signed)
Case Management Note  Patient Details  Name: Jeanne Mcneil MRN: [id number] Date of Birth: 03/17/1986  Subjective/Objective:                  Admitted with GI complaints. Pt from home, lives with parents. She has no insurance and no PCP. She does not know if she will follow up with provider at [place] or not, she may move to another state. She was provided with contact info for the [facility] that she can contact if she is interested. Pt given no new Rx, no med assistance needed.   Action/Plan: discharging home today with self care.   Expected Discharge Date:  06/14/17               Expected Discharge Plan:  Home/Self Care  In-House Referral:  NA  Discharge planning Services  CM Consult  Post Acute Care Choice:  NA Choice offered to:  NA  Status of Service:  Completed, signed off  Sherald Barge, RN 06/14/2017, 12:07 PM

## 2017-06-14 NOTE — Progress Notes (Signed)
Patient IV removed, Telemetry removed, tolerated well. Patient given discharge instructions at bedside.

## 2017-06-15 LAB — HIV ANTIBODY (ROUTINE TESTING W REFLEX): HIV Screen 4th Generation wRfx: NONREACTIVE

## 2017-06-26 ENCOUNTER — Ambulatory Visit: Payer: Self-pay | Admitting: Gastroenterology

## 2017-06-26 ENCOUNTER — Telehealth: Payer: Self-pay | Admitting: Gastroenterology

## 2017-06-26 ENCOUNTER — Encounter: Payer: Self-pay | Admitting: General Practice

## 2017-06-26 NOTE — Telephone Encounter (Signed)
Ok to dc from PRACTICE DUE TO NONPRODUCTIVE DOCTOR PATIENT RELATIONSHIP.

## 2017-06-26 NOTE — Telephone Encounter (Signed)
Discharge letter will be mailed.

## 2017-06-26 NOTE — Telephone Encounter (Signed)
Routing to Dr. Oneida Alar for the ok to discharge the patient from our practice.

## 2017-06-26 NOTE — Telephone Encounter (Signed)
Noted. We have seen her multiple times while hospitalized. Primary GI Dr. Oneida Alar.   Difficult to move forward with plan of care as we have not been able to follow-up as outpatient.

## 2017-06-26 NOTE — Telephone Encounter (Signed)
PATIENT WAS A NO SHOW X4

## 2017-07-10 ENCOUNTER — Encounter (HOSPITAL_COMMUNITY): Payer: Self-pay | Admitting: *Deleted

## 2017-07-10 ENCOUNTER — Emergency Department (HOSPITAL_COMMUNITY)
Admission: EM | Admit: 2017-07-10 | Discharge: 2017-07-10 | Disposition: A | Discharge status: Home / Self Care | Payer: Self-pay | Attending: Emergency Medicine | Admitting: Emergency Medicine

## 2017-07-10 ENCOUNTER — Encounter (HOSPITAL_COMMUNITY): Payer: Self-pay | Admitting: Cardiology

## 2017-07-10 DIAGNOSIS — R109 Unspecified abdominal pain: Secondary | ICD-10-CM | POA: Insufficient documentation

## 2017-07-10 DIAGNOSIS — G43A0 Cyclical vomiting, not intractable: Secondary | ICD-10-CM | POA: Insufficient documentation

## 2017-07-10 DIAGNOSIS — Z79899 Other long term (current) drug therapy: Secondary | ICD-10-CM | POA: Insufficient documentation

## 2017-07-10 DIAGNOSIS — R4589 Other symptoms and signs involving emotional state: Secondary | ICD-10-CM | POA: Insufficient documentation

## 2017-07-10 DIAGNOSIS — F17210 Nicotine dependence, cigarettes, uncomplicated: Secondary | ICD-10-CM | POA: Insufficient documentation

## 2017-07-10 DIAGNOSIS — R1115 Cyclical vomiting syndrome unrelated to migraine: Secondary | ICD-10-CM

## 2017-07-10 DIAGNOSIS — K920 Hematemesis: Secondary | ICD-10-CM | POA: Insufficient documentation

## 2017-07-10 LAB — CBC WITH DIFFERENTIAL/PLATELET
BASOS ABS: 0.1 10*3/uL (ref 0.0–0.1)
Basophils Relative: 0 %
EOS ABS: 0.1 10*3/uL (ref 0.0–0.7)
EOS PCT: 0 %
HCT: 37.9 % (ref 36.0–46.0)
HEMOGLOBIN: 12.5 g/dL (ref 12.0–15.0)
LYMPHS ABS: 3.7 10*3/uL (ref 0.7–4.0)
LYMPHS PCT: 21 %
MCH: 28.9 pg (ref 26.0–34.0)
MCHC: 33 g/dL (ref 30.0–36.0)
MCV: 87.5 fL (ref 78.0–100.0)
Monocytes Absolute: 1.1 10*3/uL — ABNORMAL HIGH (ref 0.1–1.0)
Monocytes Relative: 6 %
NEUTROS PCT: 73 %
Neutro Abs: 13.2 10*3/uL — ABNORMAL HIGH (ref 1.7–7.7)
PLATELETS: 356 10*3/uL (ref 150–400)
RBC: 4.33 MIL/uL (ref 3.87–5.11)
RDW: 16.7 % — ABNORMAL HIGH (ref 11.5–15.5)
WBC: 18.2 10*3/uL — AB (ref 4.0–10.5)

## 2017-07-10 LAB — RAPID URINE DRUG SCREEN, HOSP PERFORMED
Amphetamines: NOT DETECTED
BARBITURATES: NOT DETECTED
BENZODIAZEPINES: POSITIVE — AB
COCAINE: NOT DETECTED
Opiates: NOT DETECTED
TETRAHYDROCANNABINOL: POSITIVE — AB

## 2017-07-10 LAB — URINALYSIS, ROUTINE W REFLEX MICROSCOPIC
Bilirubin Urine: NEGATIVE
GLUCOSE, UA: NEGATIVE mg/dL
Ketones, ur: 5 mg/dL — AB
Nitrite: NEGATIVE
PH: 5 (ref 5.0–8.0)
PROTEIN: 100 mg/dL — AB
Specific Gravity, Urine: 1.028 (ref 1.005–1.030)

## 2017-07-10 LAB — PREGNANCY, URINE: Preg Test, Ur: NEGATIVE

## 2017-07-10 LAB — COMPREHENSIVE METABOLIC PANEL
ALK PHOS: 63 U/L (ref 38–126)
ALT: 24 U/L (ref 14–54)
AST: 22 U/L (ref 15–41)
Albumin: 4.4 g/dL (ref 3.5–5.0)
Anion gap: 12 (ref 5–15)
BUN: 18 mg/dL (ref 6–20)
CALCIUM: 9.7 mg/dL (ref 8.9–10.3)
CHLORIDE: 103 mmol/L (ref 101–111)
CO2: 25 mmol/L (ref 22–32)
CREATININE: 1.07 mg/dL — AB (ref 0.44–1.00)
GFR calc non Af Amer: >60 mL/min (ref >60)
Glucose, Bld: 169 mg/dL — ABNORMAL HIGH (ref 65–99)
Potassium: 3.1 mmol/L — ABNORMAL LOW (ref 3.5–5.1)
SODIUM: 140 mmol/L (ref 135–145)
Total Bilirubin: 0.4 mg/dL (ref 0.3–1.2)
Total Protein: 7.7 g/dL (ref 6.5–8.1)

## 2017-07-10 LAB — LIPASE, BLOOD: Lipase: 24 U/L (ref 11–51)

## 2017-07-10 MED ORDER — ONDANSETRON HCL 4 MG PO TABS: 4.0000 mg | ORAL_TABLET | Freq: Four times a day (QID) | ORAL | 0 refills | Status: DC

## 2017-07-10 MED ORDER — SODIUM CHLORIDE 0.9 % IV BOLUS (SEPSIS)
1000.0000 mL | Freq: Once | INTRAVENOUS | Status: AC
  Administered 2017-07-10: 1000 mL via INTRAVENOUS

## 2017-07-10 MED ORDER — KETOROLAC TROMETHAMINE 30 MG/ML IJ SOLN
15.0000 mg | Freq: Once | INTRAMUSCULAR | Status: AC
  Administered 2017-07-10: 15 mg via INTRAVENOUS
  Filled 2017-07-10: qty 1

## 2017-07-10 MED ORDER — HALOPERIDOL LACTATE 5 MG/ML IJ SOLN
5.0000 mg | Freq: Once | INTRAMUSCULAR | Status: AC
  Administered 2017-07-10: 5 mg via INTRAVENOUS
  Filled 2017-07-10: qty 1

## 2017-07-10 MED ORDER — PROCHLORPERAZINE 25 MG RE SUPP: 25.0000 mg | Freq: Two times a day (BID) | RECTAL | 0 refills | Status: DC | PRN

## 2017-07-10 MED ORDER — METOCLOPRAMIDE HCL 10 MG PO TABS: 10.0000 mg | ORAL_TABLET | Freq: Four times a day (QID) | ORAL | 0 refills | Status: DC | PRN

## 2017-07-10 MED ORDER — PANTOPRAZOLE SODIUM 40 MG PO TBEC
40.0000 mg | DELAYED_RELEASE_TABLET | Freq: Once | ORAL | Status: AC
  Administered 2017-07-10: 40 mg via ORAL
  Filled 2017-07-10: qty 1

## 2017-07-10 MED ORDER — METOCLOPRAMIDE HCL 5 MG/ML IJ SOLN
10.0000 mg | Freq: Once | INTRAMUSCULAR | Status: AC
  Administered 2017-07-10: 10 mg via INTRAVENOUS
  Filled 2017-07-10: qty 2

## 2017-07-10 MED ORDER — LORAZEPAM 2 MG/ML IJ SOLN
1.0000 mg | Freq: Once | INTRAMUSCULAR | Status: AC
  Administered 2017-07-10: 1 mg via INTRAVENOUS
  Filled 2017-07-10: qty 1

## 2017-07-10 MED ORDER — PROMETHAZINE HCL 25 MG PO TABS: 25.0000 mg | ORAL_TABLET | Freq: Four times a day (QID) | ORAL | 0 refills | Status: DC | PRN

## 2017-07-10 NOTE — ED Triage Notes (Signed)
Discharged from ER last night and has been in waiting area asleep ( waiting on ride).   States she vomited 10 minutes ago and blood was in her emesis.  Pt has a picture on cell phone.

## 2017-07-10 NOTE — ED Provider Notes (Signed)
[facility] DEPT Provider Note   CSN: [id number] Arrival date & time: 07/10/17  0045     History   Chief Complaint Chief Complaint  Patient presents with  . Back Pain    HPI Jeanne Mcneil is a 31 y.o. female.  Patient presents to the emergency department for evaluation of back pain with nausea and vomiting. Patient has a history of chronic abdominal pain, cyclic vomiting syndrome. Patient has required hospitalization in the past secondary to acute kidney injury from dehydration from her vomiting. Patient reports severe pain in her back with intractable vomiting today.      Past Medical History:  Diagnosis Date  . Anxiety   . Back pain   . Constipation   . Depression   . GERD (gastroesophageal reflux disease)   . Glaucoma of both eyes   . Hematuria   . History of abnormal cervical Pap smear   . History of chronic gastritis   . History of kidney stones   . Hydronephrosis, right   . Renal calculi    bilateral per ct 0902-2017  . Right ureteral stone   . Urgency of urination   . Uterine fibroid   . Uterine polyp     Patient Active Problem List   Diagnosis Date Noted  . AKI (acute kidney injury) ([place]) 06/13/2017  . Hematuria 06/13/2017  . Hypomagnesemia 04/29/2017  . Cannabinoid hyperemesis syndrome ([place]) 04/29/2017  . Acute colitis 04/29/2017  . Abdominal pain   . Normochromic normocytic anemia   . Nausea & vomiting 04/02/2017  . Acute renal failure ([place]) 01/16/2017  . Hyponatremia 01/16/2017  . Leukocytosis 01/16/2017  . Nausea and vomiting 01/15/2017  . Chest pain 01/15/2017  . Pyelonephritis 10/11/2016  . Hypokalemia 10/11/2016  . Hyperglycemia 10/11/2016  . Elevated blood pressure reading 10/11/2016  . Left nephrolithiasis 10/11/2016  . Renal colic on left side 55/97/4163  . Generalized anxiety disorder 10/11/2016  . Nausea with vomiting 07/26/2016  . Ureteral stone 07/04/2016  . UTI (urinary tract infection) 07/04/2016  . Hydronephrosis of  right kidney 07/04/2016  . GERD (gastroesophageal reflux disease) 07/04/2016  . Ureteral calculi 07/04/2016  . Abdominal pain, chronic, epigastric   . Hematemesis 05/16/2016  . Heme + stool 05/16/2016  . Abdominal pain, epigastric 05/16/2016    Past Surgical History:  Procedure Laterality Date  . CYSTOSCOPY W/ URETERAL STENT PLACEMENT Right 07/04/2016   Procedure: CYSTOSCOPY WITH RIGHT RETROGRADE PYELOGRAM, RIGHT URETERAL STENT PLACEMENT;  Surgeon: Ardis Hughs, MD;  Location: [facility];  Service: Urology;  Laterality: Right;  . CYSTOSCOPY WITH RETROGRADE PYELOGRAM, URETEROSCOPY AND STENT PLACEMENT Left 11/28/2007  . CYSTOSCOPY WITH RETROGRADE PYELOGRAM, URETEROSCOPY AND STENT PLACEMENT Right 08/02/2016   Procedure: CYSTOSCOPY WITH RIGHT  RETROGRADE PYELOGRAM, URETEROSCOPY,  AND STENT EXCHANGE;  Surgeon: Ardis Hughs, MD;  Location: [facility];  Service: Urology;  Laterality: Right;  . ESOPHAGOGASTRODUODENOSCOPY N/A 05/17/2016   Dr. Oneida Alar: LA Grade A esophagitis, mild gastritis s/p biopsy noting reactive gastritis, medium sized hiatal hernia, next EGD WITH PROPOFOL   . INDUCED ABORTION  2009    OB History    Gravida Para Term Preterm AB Living   3 2 2   1 2    SAB TAB Ectopic Multiple Live Births   1       2       Home Medications    Prior to Admission medications   Medication Sig Start Date End Date Taking? Authorizing Provider  pantoprazole (PROTONIX) 40 MG  tablet Take 1 tablet (40 mg total) by mouth 2 (two) times daily. 04/30/17  Yes Samuella Cota, MD  promethazine (PHENERGAN) 25 MG tablet Take 25 mg by mouth every 6 (six) hours as needed for nausea or vomiting.   Yes [provider]    Family History Family History  Problem Relation Age of Onset  . Colon cancer Paternal Uncle        Passed away 02/05/15  . Nephrolithiasis Father   . Hypertension Father   . Nephrolithiasis Sister   . Nephrolithiasis Sister   . Nephrolithiasis Sister     . Hypertension Mother     Social History Social History  Substance Use Topics  . Smoking status: Current Every Day Smoker    Packs/day: 0.50    Years: 20.00    Types: Cigarettes  . Smokeless tobacco: Never Used     Comment: 5-7 cigarettes daily   . Alcohol use No     Comment: states has not used in 2 years     Allergies   Keflex [cephalexin]; Codeine; Tape; and Vicodin [hydrocodone-acetaminophen]   Review of Systems Review of Systems  Gastrointestinal: Positive for abdominal pain, nausea and vomiting.  Musculoskeletal: Positive for back pain.  All other systems reviewed and are negative.    Physical Exam Updated Vital Signs BP (!) 145/75 (BP Location: Left Arm)   Pulse 84   Temp 97.7 F (36.5 C) (Oral)   Resp 20   Ht 5' 2"  (1.575 m)   Wt 59.4 kg (131 lb)   LMP 06/07/2017   SpO2 99%   BMI 23.96 kg/m   Physical Exam  Constitutional: She is oriented to person, place, and time. She appears well-developed and well-nourished. She appears distressed.  HENT:  Head: Normocephalic and atraumatic.  Right Ear: Hearing normal.  Left Ear: Hearing normal.  Nose: Nose normal.  Mouth/Throat: Oropharynx is clear and moist and mucous membranes are normal.  Eyes: Pupils are equal, round, and reactive to light. Conjunctivae and EOM are normal.  Neck: Normal range of motion. Neck supple.  Cardiovascular: Regular rhythm, S1 normal and S2 normal.  Exam reveals no gallop and no friction rub.   No murmur heard. Pulmonary/Chest: Effort normal and breath sounds normal. No respiratory distress. She exhibits no tenderness.  Abdominal: Soft. Normal appearance and bowel sounds are normal. There is no hepatosplenomegaly. There is no tenderness. There is no rebound, no guarding, no tenderness at McBurney's point and negative Murphy's sign. No hernia.  Musculoskeletal: Normal range of motion.  Neurological: She is alert and oriented to person, place, and time. She has normal strength. No  cranial nerve deficit or sensory deficit. Coordination normal. GCS eye subscore is 4. GCS verbal subscore is 5. GCS motor subscore is 6.  Skin: Skin is warm, dry and intact. No rash noted. No cyanosis.  Psychiatric: She has a normal mood and affect. Her speech is normal and behavior is normal. Thought content normal.  Nursing note and vitals reviewed.    ED Treatments / Results  Labs (all labs ordered are listed, but only abnormal results are displayed) Labs Reviewed  CBC WITH DIFFERENTIAL/PLATELET - Abnormal; Notable for the following:       Result Value   WBC 18.2 (*)    RDW 16.7 (*)    Neutro Abs 13.2 (*)    Monocytes Absolute 1.1 (*)    All other components within normal limits  COMPREHENSIVE METABOLIC PANEL - Abnormal; Notable for the following:  Potassium 3.1 (*)    Glucose, Bld 169 (*)    Creatinine, Ser 1.07 (*)    All other components within normal limits  URINALYSIS, ROUTINE W REFLEX MICROSCOPIC - Abnormal; Notable for the following:    APPearance HAZY (*)    Hgb urine dipstick LARGE (*)    Ketones, ur 5 (*)    Protein, ur 100 (*)    Leukocytes, UA TRACE (*)    Bacteria, UA RARE (*)    Squamous Epithelial / LPF 6-30 (*)    All other components within normal limits  RAPID URINE DRUG SCREEN, HOSP PERFORMED - Abnormal; Notable for the following:    Benzodiazepines POSITIVE (*)    Tetrahydrocannabinol POSITIVE (*)    All other components within normal limits  URINE CULTURE  LIPASE, BLOOD  PREGNANCY, URINE    EKG  EKG Interpretation None       Radiology No results found.  Procedures Procedures (including critical care time)  Medications Ordered in ED Medications  sodium chloride 0.9 % bolus 1,000 mL (1,000 mLs Intravenous New Bag/Given 07/10/17 0106)    Followed by  sodium chloride 0.9 % bolus 1,000 mL (not administered)  ketorolac (TORADOL) 30 MG/ML injection 15 mg (not administered)  metoCLOPramide (REGLAN) injection 10 mg (not administered)    LORazepam (ATIVAN) injection 1 mg (not administered)  haloperidol lactate (HALDOL) injection 5 mg (5 mg Intravenous Given 07/10/17 0107)     Initial Impression / Assessment and Plan / ED Course  I have reviewed the triage vital signs and the nursing notes.  Pertinent labs & imaging results that were available during my care of the patient were reviewed by me and considered in my medical decision making (see chart for details).     Patient presents to the ER with complaints of back pain, nausea and vomiting. Patient has had frequent visits for cyclic vomiting syndrome. She does have a history of kidney stones as well. Urinalysis chronically has hematuria of unclear etiology. She is currently complaining of bilateral flank pain. It is not felt that this is likely renal colic. Reviewing her records reveals recent hospitalization for acute kidney injury secondary to prerenal acidemia from her vomiting. Her creatinine today, however, is normal. She does have leukocytosis, this common for her. I do not suspect acute infection. Urinalysis does not suggest infection. Will culture.  Discussed cyclic vomiting secondary to chronic cannabis use with patient. She became angry and insistent that this was not causing her symptoms. Recommended that she avoid smoking cannabis in the future. She was aggressively hydrated. She was treated with Haldol, Reglan for vomiting. She was given Ativan as well as Toradol for vomiting and pain. As her labs are at baseline, no renal failure, she does not require hospitalization.  Final Clinical Impressions(s) / ED Diagnoses   Final diagnoses:  Non-intractable cyclical vomiting with nausea    New Prescriptions New Prescriptions   No medications on file     Orpah Greek, MD 07/10/17 0210

## 2017-07-10 NOTE — Discharge Instructions (Signed)
Follow up with your gi md

## 2017-07-10 NOTE — ED Triage Notes (Signed)
Pt arrived by EMS complaining of history of acute renal problems & back pain.

## 2017-07-10 NOTE — ED Provider Notes (Signed)
[facility] DEPT Provider Note   CSN: [id number] Arrival date & time: 07/10/17  4315     History   Chief Complaint Chief Complaint  Patient presents with  . Emesis    HPI Jeanne Mcneil is a 31 y.o. female.  Patient states that she threw up some blood out in the waiting room while she was waiting for a ride home. She was seen in emergency department for cyclic vomiting was treated and had unremarkable labs   The history is provided by the patient.  Emesis   This is a recurrent problem. The current episode started more than 2 days ago. The problem occurs more than 10 times per day. The problem has not changed since onset.The emesis has an appearance of stomach contents and bright red blood. There has been no fever. Pertinent negatives include no abdominal pain, no chills, no cough, no diarrhea and no headaches. Risk factors: hx of cyclic vomiting.    Past Medical History:  Diagnosis Date  . Anxiety   . Back pain   . Constipation   . Depression   . GERD (gastroesophageal reflux disease)   . Glaucoma of both eyes   . Hematuria   . History of abnormal cervical Pap smear   . History of chronic gastritis   . History of kidney stones   . Hydronephrosis, right   . Renal calculi    bilateral per ct 0902-2017  . Right ureteral stone   . Urgency of urination   . Uterine fibroid   . Uterine polyp     Patient Active Problem List   Diagnosis Date Noted  . AKI (acute kidney injury) ([place]) 06/13/2017  . Hematuria 06/13/2017  . Hypomagnesemia 04/29/2017  . Cannabinoid hyperemesis syndrome ([place]) 04/29/2017  . Acute colitis 04/29/2017  . Abdominal pain   . Normochromic normocytic anemia   . Nausea & vomiting 04/02/2017  . Acute renal failure ([place] ) 01/16/2017  . Hyponatremia 01/16/2017  . Leukocytosis 01/16/2017  . Nausea and vomiting 01/15/2017  . Chest pain 01/15/2017  . Pyelonephritis 10/11/2016  . Hypokalemia 10/11/2016  . Hyperglycemia 10/11/2016  . Elevated blood  pressure reading 10/11/2016  . Left nephrolithiasis 10/11/2016  . Renal colic on left side 40/06/6760  . Generalized anxiety disorder 10/11/2016  . Nausea with vomiting 07/26/2016  . Ureteral stone 07/04/2016  . UTI (urinary tract infection) 07/04/2016  . Hydronephrosis of right kidney 07/04/2016  . GERD (gastroesophageal reflux disease) 07/04/2016  . Ureteral calculi 07/04/2016  . Abdominal pain, chronic, epigastric   . Hematemesis 05/16/2016  . Heme + stool 05/16/2016  . Abdominal pain, epigastric 05/16/2016    Past Surgical History:  Procedure Laterality Date  . CYSTOSCOPY W/ URETERAL STENT PLACEMENT Right 07/04/2016   Procedure: CYSTOSCOPY WITH RIGHT RETROGRADE PYELOGRAM, RIGHT URETERAL STENT PLACEMENT;  Surgeon: Ardis Hughs, MD;  Location: [facility];  Service: Urology;  Laterality: Right;  . CYSTOSCOPY WITH RETROGRADE PYELOGRAM, URETEROSCOPY AND STENT PLACEMENT Left 11/28/2007  . CYSTOSCOPY WITH RETROGRADE PYELOGRAM, URETEROSCOPY AND STENT PLACEMENT Right 08/02/2016   Procedure: CYSTOSCOPY WITH RIGHT  RETROGRADE PYELOGRAM, URETEROSCOPY,  AND STENT EXCHANGE;  Surgeon: Ardis Hughs, MD;  Location: [facility];  Service: Urology;  Laterality: Right;  . ESOPHAGOGASTRODUODENOSCOPY N/A 05/17/2016   Dr. Oneida Alar: LA Grade A esophagitis, mild gastritis s/p biopsy noting reactive gastritis, medium sized hiatal hernia, next EGD WITH PROPOFOL   . INDUCED ABORTION  2009    OB History    Gravida Para Term Preterm AB  Living   3 2 2   1 2    SAB TAB Ectopic Multiple Live Births   1       2       Home Medications    Prior to Admission medications   Medication Sig Start Date End Date Taking? Authorizing Provider  metoCLOPramide (REGLAN) 10 MG tablet Take 1 tablet (10 mg total) by mouth every 6 (six) hours as needed for nausea (nausea/headache). 07/10/17   Orpah Greek, MD  ondansetron (ZOFRAN) 4 MG tablet Take 1 tablet (4 mg total) by mouth every 6 (six)  hours. 07/10/17   Orpah Greek, MD  pantoprazole (PROTONIX) 40 MG tablet Take 1 tablet (40 mg total) by mouth 2 (two) times daily. 04/30/17   Samuella Cota, MD  prochlorperazine (COMPAZINE) 25 MG suppository Place 1 suppository (25 mg total) rectally every 12 (twelve) hours as needed for nausea or vomiting. 07/10/17   Pollina, Gwenyth Allegra, MD  promethazine (PHENERGAN) 25 MG tablet Take 1 tablet (25 mg total) by mouth every 6 (six) hours as needed for nausea or vomiting. 07/10/17   Pollina, Gwenyth Allegra, MD    Family History Family History  Problem Relation Age of Onset  . Colon cancer Paternal Uncle        Passed away Jan 23, 2015  . Nephrolithiasis Father   . Hypertension Father   . Nephrolithiasis Sister   . Nephrolithiasis Sister   . Nephrolithiasis Sister   . Hypertension Mother     Social History Social History  Substance Use Topics  . Smoking status: Current Every Day Smoker    Packs/day: 0.50    Years: 20.00    Types: Cigarettes  . Smokeless tobacco: Never Used     Comment: 5-7 cigarettes daily   . Alcohol use No     Comment: states has not used in 2 years     Allergies   Keflex [cephalexin]; Codeine; Tape; and Vicodin [hydrocodone-acetaminophen]   Review of Systems Review of Systems  Constitutional: Negative for appetite change, chills and fatigue.  HENT: Negative for congestion, ear discharge and sinus pressure.   Eyes: Negative for discharge.  Respiratory: Negative for cough.   Cardiovascular: Negative for chest pain.  Gastrointestinal: Positive for vomiting. Negative for abdominal pain and diarrhea.       Vomiting blood  Genitourinary: Negative for frequency and hematuria.  Musculoskeletal: Negative for back pain.  Skin: Negative for rash.  Neurological: Negative for seizures and headaches.  Psychiatric/Behavioral: Negative for hallucinations.     Physical Exam Updated Vital Signs BP (!) 99/54   Pulse 76   Temp (!) 97.5 F (36.4 C) (Oral)    Resp 16   Ht 5' 2"  (1.575 m)   Wt 59.4 kg (131 lb)   LMP 06/11/2017   SpO2 98%   BMI 23.96 kg/m   Physical Exam  Constitutional: She is oriented to person, place, and time. She appears well-developed.  HENT:  Head: Normocephalic.  Eyes: Conjunctivae and EOM are normal. No scleral icterus.  Neck: Neck supple. No thyromegaly present.  Cardiovascular: Normal rate and regular rhythm.   Pulmonary/Chest: Effort normal. No stridor.  Abdominal: She exhibits no distension. There is no tenderness.  Musculoskeletal: Normal range of motion. She exhibits no edema.  Lymphadenopathy:    She has no cervical adenopathy.  Neurological: She is oriented to person, place, and time. She exhibits normal muscle tone. Coordination normal.  Skin: No rash noted. No erythema.  Psychiatric: She has a normal mood  and affect. Her behavior is normal.     ED Treatments / Results  Labs (all labs ordered are listed, but only abnormal results are displayed) Labs Reviewed - No data to display  EKG  EKG Interpretation None       Radiology No results found.  Procedures Procedures (including critical care time)  Medications Ordered in ED Medications  pantoprazole (PROTONIX) EC tablet 40 mg (not administered)     Initial Impression / Assessment and Plan / ED Course  I have reviewed the triage vital signs and the nursing notes.  Pertinent labs & imaging results that were available during my care of the patient were reviewed by me and considered in my medical decision making (see chart for details).     Patient not vomiting in the emergency department. Patient is not orthostatic. She had labs earlier today that showed her hemoglobin was normal. Patient is given her normal protonic dose and will follow-up with her GI doctor  Final Clinical Impressions(s) / ED Diagnoses   Final diagnoses:  Hematemesis with nausea    New Prescriptions New Prescriptions   No medications on file     Milton Ferguson, MD 07/10/17 [phone]

## 2017-07-10 NOTE — ED Notes (Signed)
Pt alert & oriented x4, stable gait. Patient given discharge instructions, paperwork & prescription(s). Patient  instructed to stop at the registration desk to finish any additional paperwork. Patient verbalized understanding. Pt left department w/ no further questions. 

## 2017-07-11 LAB — URINE CULTURE: Culture: <10000 — AB

## 2017-08-03 ENCOUNTER — Emergency Department (HOSPITAL_COMMUNITY): Payer: Self-pay

## 2017-08-03 ENCOUNTER — Encounter (HOSPITAL_COMMUNITY): Payer: Self-pay | Admitting: Emergency Medicine

## 2017-08-03 ENCOUNTER — Emergency Department (HOSPITAL_COMMUNITY)
Admission: EM | Admit: 2017-08-03 | Discharge: 2017-08-03 | Disposition: A | Discharge status: Home / Self Care | Payer: Self-pay | Attending: Emergency Medicine | Admitting: Emergency Medicine

## 2017-08-03 DIAGNOSIS — R112 Nausea with vomiting, unspecified: Secondary | ICD-10-CM | POA: Insufficient documentation

## 2017-08-03 DIAGNOSIS — R5383 Other fatigue: Secondary | ICD-10-CM | POA: Insufficient documentation

## 2017-08-03 DIAGNOSIS — R110 Nausea: Secondary | ICD-10-CM

## 2017-08-03 DIAGNOSIS — R197 Diarrhea, unspecified: Secondary | ICD-10-CM | POA: Insufficient documentation

## 2017-08-03 DIAGNOSIS — R1084 Generalized abdominal pain: Secondary | ICD-10-CM | POA: Insufficient documentation

## 2017-08-03 DIAGNOSIS — Z79899 Other long term (current) drug therapy: Secondary | ICD-10-CM | POA: Insufficient documentation

## 2017-08-03 DIAGNOSIS — F17210 Nicotine dependence, cigarettes, uncomplicated: Secondary | ICD-10-CM | POA: Insufficient documentation

## 2017-08-03 LAB — COMPREHENSIVE METABOLIC PANEL
ALBUMIN: 5 g/dL (ref 3.5–5.0)
ALT: 15 U/L (ref 14–54)
ANION GAP: 15 (ref 5–15)
AST: 21 U/L (ref 15–41)
Alkaline Phosphatase: 60 U/L (ref 38–126)
BILIRUBIN TOTAL: 0.4 mg/dL (ref 0.3–1.2)
BUN: 15 mg/dL (ref 6–20)
CO2: 20 mmol/L — ABNORMAL LOW (ref 22–32)
Calcium: 10.1 mg/dL (ref 8.9–10.3)
Chloride: 104 mmol/L (ref 101–111)
Creatinine, Ser: 1.02 mg/dL — ABNORMAL HIGH (ref 0.44–1.00)
GFR calc Af Amer: >60 mL/min (ref >60)
GFR calc non Af Amer: >60 mL/min (ref >60)
GLUCOSE: 160 mg/dL — AB (ref 65–99)
POTASSIUM: 3.2 mmol/L — AB (ref 3.5–5.1)
SODIUM: 139 mmol/L (ref 135–145)
TOTAL PROTEIN: 8.3 g/dL — AB (ref 6.5–8.1)

## 2017-08-03 LAB — CBC
HEMATOCRIT: 39.2 % (ref 36.0–46.0)
HEMOGLOBIN: 13.2 g/dL (ref 12.0–15.0)
MCH: 29 pg (ref 26.0–34.0)
MCHC: 33.7 g/dL (ref 30.0–36.0)
MCV: 86.2 fL (ref 78.0–100.0)
PLATELETS: 421 10*3/uL — AB (ref 150–400)
RBC: 4.55 MIL/uL (ref 3.87–5.11)
RDW: 15.6 % — ABNORMAL HIGH (ref 11.5–15.5)
WBC: 14.6 10*3/uL — ABNORMAL HIGH (ref 4.0–10.5)

## 2017-08-03 LAB — LIPASE, BLOOD: LIPASE: 17 U/L (ref 11–51)

## 2017-08-03 LAB — URINALYSIS, ROUTINE W REFLEX MICROSCOPIC
Bacteria, UA: NONE SEEN
Bilirubin Urine: NEGATIVE
GLUCOSE, UA: 50 mg/dL — AB
Ketones, ur: 20 mg/dL — AB
Nitrite: NEGATIVE
PH: 6 (ref 5.0–8.0)
PROTEIN: 100 mg/dL — AB
Specific Gravity, Urine: >1.046 — ABNORMAL HIGH (ref 1.005–1.030)

## 2017-08-03 LAB — I-STAT BETA HCG BLOOD, ED (MC, WL, AP ONLY): I-stat hCG, quantitative: <5 m[IU]/mL (ref <5)

## 2017-08-03 MED ORDER — SODIUM CHLORIDE 0.9 % IV BOLUS (SEPSIS)
1000.0000 mL | Freq: Once | INTRAVENOUS | Status: AC
  Administered 2017-08-03: 1000 mL via INTRAVENOUS

## 2017-08-03 MED ORDER — IOPAMIDOL (ISOVUE-300) INJECTION 61%
INTRAVENOUS | Status: AC
  Administered 2017-08-03: 100 mL via INTRAVENOUS
  Filled 2017-08-03: qty 100

## 2017-08-03 MED ORDER — POTASSIUM CHLORIDE CRYS ER 20 MEQ PO TBCR
40.0000 meq | EXTENDED_RELEASE_TABLET | Freq: Once | ORAL | Status: AC
  Administered 2017-08-03: 40 meq via ORAL
  Filled 2017-08-03: qty 2

## 2017-08-03 MED ORDER — HALOPERIDOL LACTATE 5 MG/ML IJ SOLN
5.0000 mg | Freq: Once | INTRAMUSCULAR | Status: AC
  Administered 2017-08-03: 5 mg via INTRAVENOUS
  Filled 2017-08-03: qty 1

## 2017-08-03 MED ORDER — KETOROLAC TROMETHAMINE 30 MG/ML IJ SOLN
30.0000 mg | Freq: Once | INTRAMUSCULAR | Status: AC
  Administered 2017-08-03: 30 mg via INTRAVENOUS
  Filled 2017-08-03: qty 1

## 2017-08-03 NOTE — ED Notes (Signed)
Patient took K+ then threw it up.

## 2017-08-03 NOTE — Discharge Instructions (Signed)
Please read attached information regarding your condition. Follow-up with [facility] and wellness for further evaluation. Return to ED for worsening abdominal pain, increased vomiting, blood in stool, fevers, lightheadedness or loss of consciousness.

## 2017-08-03 NOTE — ED Notes (Addendum)
Patient came out of room stating she had taken out her own IV and was leaving. Patient denied wanting discharge papers or vital signs. Patient was ambulatory with steady gait.

## 2017-08-03 NOTE — ED Triage Notes (Signed)
Pt reports she began to have worsening abd pain accompanied by v/d that started at 0500 this am. Pt reports it feels like her diverticulitis.

## 2017-08-03 NOTE — ED Provider Notes (Signed)
[facility] DEPT Provider Note   CSN: [id number] Arrival date & time: 08/03/17  1623     History   Chief Complaint Chief Complaint  Patient presents with  . Abdominal Pain    HPI GEORGANN BRAMBLE is a 31 y.o. female.  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 She has Phenergan at home to take for nausea but is unable to keep anything down. She also reports several episodes of diarrhea. She denies any dysuria, blood in stool, fevers, vaginal discharge. She is currently on her menstrual cycle.  Past Medical History:  Diagnosis Date  . Anxiety   . Back pain   . Constipation   . Depression   . GERD (gastroesophageal reflux disease)   . Glaucoma of both eyes   . Hematuria   . History of abnormal cervical Pap smear   . History of chronic gastritis   . History of kidney stones   . Hydronephrosis, right   . Renal calculi    bilateral per ct 0902-2017  . Right ureteral stone   . Urgency of urination   . Uterine fibroid   . Uterine polyp     Patient Active Problem List   Diagnosis Date Noted  . AKI (acute kidney injury) ([place]) 06/13/2017  . Hematuria 06/13/2017  . Hypomagnesemia 04/29/2017  . Cannabinoid hyperemesis syndrome ([place]) 04/29/2017  . Acute colitis 04/29/2017  . Abdominal pain   . Normochromic normocytic anemia   . Nausea & vomiting 04/02/2017  . Acute renal failure ([place]) 01/16/2017  . Hyponatremia 01/16/2017  . Leukocytosis 01/16/2017  . Nausea and vomiting 01/15/2017  . Chest pain 01/15/2017  . Pyelonephritis 10/11/2016  . Hypokalemia 10/11/2016  . Hyperglycemia 10/11/2016  . Elevated blood pressure reading 10/11/2016  . Left nephrolithiasis 10/11/2016  . Renal colic on left side 62/70/3500  . Generalized anxiety disorder 10/11/2016  . Nausea with vomiting 07/26/2016  .  Ureteral stone 07/04/2016  . UTI (urinary tract infection) 07/04/2016  . Hydronephrosis of right kidney 07/04/2016  . GERD (gastroesophageal reflux disease) 07/04/2016  . Ureteral calculi 07/04/2016  . Abdominal pain, chronic, epigastric   . Hematemesis 05/16/2016  . Heme + stool 05/16/2016  . Abdominal pain, epigastric 05/16/2016    Past Surgical History:  Procedure Laterality Date  . CYSTOSCOPY W/ URETERAL STENT PLACEMENT Right 07/04/2016   Procedure: CYSTOSCOPY WITH RIGHT RETROGRADE PYELOGRAM, RIGHT URETERAL STENT PLACEMENT;  Surgeon: Ardis Hughs, MD;  Location: [facility];  Service: Urology;  Laterality: Right;  . CYSTOSCOPY WITH RETROGRADE PYELOGRAM, URETEROSCOPY AND STENT PLACEMENT Left 11/28/2007  . CYSTOSCOPY WITH RETROGRADE PYELOGRAM, URETEROSCOPY AND STENT PLACEMENT Right 08/02/2016   Procedure: CYSTOSCOPY WITH RIGHT  RETROGRADE PYELOGRAM, URETEROSCOPY,  AND STENT EXCHANGE;  Surgeon: Ardis Hughs, MD;  Location: [facility];  Service: Urology;  Laterality: Right;  . ESOPHAGOGASTRODUODENOSCOPY N/A 05/17/2016   Dr. Oneida Alar: LA Grade A esophagitis, mild gastritis s/p biopsy noting reactive gastritis, medium sized hiatal hernia, next EGD WITH PROPOFOL   . INDUCED ABORTION  2009    OB History    Gravida Para Term Preterm AB Living   3 2 2   1 2    SAB TAB Ectopic Multiple Live Births   1       2       Home Medications    Prior to Admission  medications   Medication Sig Start Date End Date Taking? Authorizing Provider  metoCLOPramide (REGLAN) 10 MG tablet Take 1 tablet (10 mg total) by mouth every 6 (six) hours as needed for nausea (nausea/headache). 07/10/17   Orpah Greek, MD  ondansetron (ZOFRAN) 4 MG tablet Take 1 tablet (4 mg total) by mouth every 6 (six) hours. 07/10/17   Orpah Greek, MD  pantoprazole (PROTONIX) 40 MG tablet Take 1 tablet (40 mg total) by mouth 2 (two) times daily. 04/30/17   Samuella Cota, MD  prochlorperazine  (COMPAZINE) 25 MG suppository Place 1 suppository (25 mg total) rectally every 12 (twelve) hours as needed for nausea or vomiting. 07/10/17   Pollina, Gwenyth Allegra, MD  promethazine (PHENERGAN) 25 MG tablet Take 1 tablet (25 mg total) by mouth every 6 (six) hours as needed for nausea or vomiting. 07/10/17   Pollina, Gwenyth Allegra, MD    Family History Family History  Problem Relation Age of Onset  . Colon cancer Paternal Uncle        Passed away 01-30-15  . Nephrolithiasis Father   . Hypertension Father   . Nephrolithiasis Sister   . Nephrolithiasis Sister   . Nephrolithiasis Sister   . Hypertension Mother     Social History Social History  Substance Use Topics  . Smoking status: Current Every Day Smoker    Packs/day: 0.50    Years: 20.00    Types: Cigarettes  . Smokeless tobacco: Never Used     Comment: 5-7 cigarettes daily   . Alcohol use No     Comment: states has not used in 2 years     Allergies   Keflex [cephalexin]; Codeine; Tape; and Vicodin [hydrocodone-acetaminophen]   Review of Systems Review of Systems  Constitutional: Positive for fatigue. Negative for appetite change, chills and fever.  HENT: Negative for ear pain, rhinorrhea, sneezing and sore throat.   Eyes: Negative for photophobia and visual disturbance.  Respiratory: Negative for cough, chest tightness, shortness of breath and wheezing.   Cardiovascular: Negative for chest pain and palpitations.  Gastrointestinal: Positive for abdominal pain, diarrhea, nausea and vomiting. Negative for blood in stool and constipation.  Genitourinary: Negative for dysuria, hematuria and urgency.  Musculoskeletal: Negative for myalgias.  Skin: Negative for rash.  Neurological: Negative for dizziness, weakness and light-headedness.     Physical Exam Updated Vital Signs BP (!) 148/98 (BP Location: Left Arm)   Pulse 90   Temp 98.1 F (36.7 C) (Oral)   Resp 20   SpO2 98%   Physical Exam  Constitutional: She appears  well-developed and well-nourished. No distress.  HENT:  Head: Normocephalic and atraumatic.  Nose: Nose normal.  Eyes: Conjunctivae and EOM are normal. Left eye exhibits no discharge. No scleral icterus.  Neck: Normal range of motion. Neck supple.  Cardiovascular: Normal rate, regular rhythm, normal heart sounds and intact distal pulses.  Exam reveals no gallop and no friction rub.   No murmur heard. Pulmonary/Chest: Effort normal and breath sounds normal. No respiratory distress.  Abdominal: Soft. Bowel sounds are normal. She exhibits no distension. There is tenderness. There is no guarding.    Musculoskeletal: Normal range of motion. She exhibits no edema.  Neurological: She is alert. She exhibits normal muscle tone. Coordination normal.  Skin: Skin is warm and dry. No rash noted.  Psychiatric: She has a normal mood and affect.  Nursing note and vitals reviewed.    ED Treatments / Results  Labs (all labs ordered are listed, but  only abnormal results are displayed) Labs Reviewed  COMPREHENSIVE METABOLIC PANEL - Abnormal; Notable for the following:       Result Value   Potassium 3.2 (*)    CO2 20 (*)    Glucose, Bld 160 (*)    Creatinine, Ser 1.02 (*)    Total Protein 8.3 (*)    All other components within normal limits  CBC - Abnormal; Notable for the following:    WBC 14.6 (*)    RDW 15.6 (*)    Platelets 421 (*)    All other components within normal limits  LIPASE, BLOOD  URINALYSIS, ROUTINE W REFLEX MICROSCOPIC  I-STAT BETA HCG BLOOD, ED ([facility], [facility], [facility] ONLY)    EKG  EKG Interpretation None       Radiology No results found.  Procedures Procedures (including critical care time)  Medications Ordered in ED Medications - No data to display   Initial Impression / Assessment and Plan / ED Course  I have reviewed the triage vital signs and the nursing notes.  Pertinent labs & imaging results that were available during my care of the patient were reviewed by me  and considered in my medical decision making (see chart for details).  Clinical Course as of Aug 04 1743  Fri Aug 03, 2017  1731 Patient not in room on my initial assessment  [HK]    Clinical Course User Index [HK] Delia Heady, [place]    Patient presents to ED for evaluation of 12 hour history of several episodes of vomiting and left-sided abdominal pain. 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 On physical exam she does have left sided tenderness to palpation. Lab work showing low potassium at 3.2 and elevated WBC count at 14.6.  Urinalysis with hemoglobin nd white blood cells which patient has had in the past. CT showed evidence of kidney stone but no other acute abnormality. Patient tolerating by mouth intake with Toradol and Haldol given in the ED. Patient given fluid bolus as well. On my reexam patient states that she wants to leave and would not want any further workup or medications at this time. Strict return precautions given.  Final Clinical Impressions(s) / ED Diagnoses   Final diagnoses:  None    New Prescriptions New Prescriptions   No medications on file     Delia Heady, PA-C 08/04/17 0000    Daleen Bo, MD 08/05/17 [phone]

## 2017-08-06 ENCOUNTER — Encounter (HOSPITAL_COMMUNITY): Payer: Self-pay | Admitting: Emergency Medicine

## 2017-08-06 ENCOUNTER — Ambulatory Visit (HOSPITAL_COMMUNITY)
Admission: EM | Admit: 2017-08-06 | Discharge: 2017-08-06 | Disposition: A | Discharge status: Home / Self Care | Payer: Self-pay | Attending: Family Medicine | Admitting: Family Medicine

## 2017-08-06 DIAGNOSIS — G43A1 Cyclical vomiting, intractable: Secondary | ICD-10-CM

## 2017-08-06 DIAGNOSIS — R1115 Cyclical vomiting syndrome unrelated to migraine: Secondary | ICD-10-CM

## 2017-08-06 DIAGNOSIS — R1013 Epigastric pain: Secondary | ICD-10-CM

## 2017-08-06 LAB — POCT URINALYSIS DIP (DEVICE)
Glucose, UA: NEGATIVE mg/dL
KETONES UR: 15 mg/dL — AB
Leukocytes, UA: NEGATIVE
Nitrite: NEGATIVE
PH: 5.5 (ref 5.0–8.0)
Protein, ur: 100 mg/dL — AB
Specific Gravity, Urine: >=1.03 (ref 1.005–1.030)
Urobilinogen, UA: 1 mg/dL (ref 0.0–1.0)

## 2017-08-06 MED ORDER — METOCLOPRAMIDE HCL 10 MG PO TABS: 10.0000 mg | ORAL_TABLET | Freq: Four times a day (QID) | ORAL | 3 refills | Status: DC | PRN

## 2017-08-06 MED ORDER — ONDANSETRON 4 MG PO TBDP
4.0000 mg | ORAL_TABLET | Freq: Once | ORAL | Status: AC
  Administered 2017-08-06: 4 mg via ORAL

## 2017-08-06 MED ORDER — ONDANSETRON 4 MG PO TBDP
ORAL_TABLET | ORAL | Status: AC
  Filled 2017-08-06: qty 1

## 2017-08-06 MED ORDER — PANTOPRAZOLE SODIUM 40 MG PO TBEC: 40.0000 mg | DELAYED_RELEASE_TABLET | Freq: Two times a day (BID) | ORAL | 0 refills | Status: DC

## 2017-08-06 NOTE — ED Notes (Signed)
PT leaves facility without waiting for discharge paperwork. PT reports she plans to report to an ER for further treatment.

## 2017-08-06 NOTE — ED Provider Notes (Signed)
[place]   [id number] 08/06/17 Arrival Time: 02/12/26   SUBJECTIVE:  Jeanne Mcneil is a 31 y.o. female who presents to the urgent care with complaint of several chronic abdominal problems.   PT reports severe epigastric pain for 4 days. PT was seen in [facility] Friday and had a workup. PT reports she was prescribed phenergan, but she isn't able to keep it down. PT reports several episodes of vomiting in last 24 hours. PT requests pain and nausea meds in triage. She has had intractable vomiting since she was seen in the emergency room.  Her problems originally began 2 years ago. She's had chronic nausea and vomiting every time she is 1. Ever since. She was endoscoped and her stomach lining was "cauterized".  Patient states that Protonix helps her but Nexium and other PPI medicines do not help her. She states that she has diffuse abdominal pain with intractable vomiting whenever this occurs.  Patient works in Biomedical scientist. She denies any medical substance abuse. She states that she missed an appointment with her gastroenterologist and so he discharged her from the practice when she was in the hospital and could not make her appointment.  Patient says that she has urinary frequency   Past Medical History:  Diagnosis Date  . Anxiety   . Back pain   . Constipation   . Depression   . GERD (gastroesophageal reflux disease)   . Glaucoma of both eyes   . Hematuria   . History of abnormal cervical Pap smear   . History of chronic gastritis   . History of kidney stones   . Hydronephrosis, right   . Renal calculi    bilateral per ct 0902-2017  . Right ureteral stone   . Urgency of urination   . Uterine fibroid   . Uterine polyp    Family History  Problem Relation Age of Onset  . Colon cancer Paternal Uncle        Passed away 02/12/15  . Nephrolithiasis Father   . Hypertension Father   . Nephrolithiasis Sister   . Nephrolithiasis Sister   . Nephrolithiasis Sister   .  Hypertension Mother    Social History   Social History  . Marital status: Single    Spouse name: N/A  . Number of children: 2  . Years of education: 11   Occupational History  . Unemployed    Social History Main Topics  . Smoking status: Current Every Day Smoker    Packs/day: 0.50    Years: 20.00    Types: Cigarettes  . Smokeless tobacco: Never Used     Comment: 5-7 cigarettes daily   . Alcohol use No     Comment: states has not used in 2 years  . Drug use: Yes    Types: Marijuana, Barbituates     Comment: "Only when I feel nauseated and can't function"  . Sexual activity: Yes    Birth control/ protection: None   Other Topics Concern  . Not on file   Social History Narrative   Lives with her father and step-mother. Unemployed.   No outpatient prescriptions have been marked as taking for the 08/06/17 encounter [facility] Encounter).   Allergies  Allergen Reactions  . Keflex [Cephalexin] Other (See Comments)    Reaction:  Bladder spasms  . Codeine Hives and Itching  . Tape Other (See Comments)    Reaction:  Burning   . Vicodin [Hydrocodone-Acetaminophen] Hives and Itching      ROS: As  per HPI, remainder of ROS negative.   OBJECTIVE:   Vitals:   08/06/17 1253 08/06/17 1255  BP: (!) 144/104   Pulse: 90   Resp: 16   Temp: 98.1 F (36.7 C)   TempSrc: Oral   SpO2: 97%   Weight:  126 lb (57.2 kg)  Height:  5' 2"  (1.575 m)     General appearance: alert; Patient is actively vomiting and retching while in the office Eyes: PERRL; EOMI; conjunctiva normal HENT: normocephalic; atraumatic; TMs normal, canal normal, external ears normal without trauma; nasal mucosa normal; oral mucosa normal Neck: supple Lungs: clear to auscultation bilaterally Heart: regular rate and rhythm Abdomen: soft, Diffuse mild tenderness; bowel sounds normal; no masses or organomegaly; no guarding or rebound tenderness Back: no CVA tenderness Extremities: no cyanosis or edema;  symmetrical with no gross deformities Skin: warm and dry Neurologic: normal gait; grossly normal Psychological: alert and cooperative; normal mood and affect      Labs:  Results for orders placed or performed during the hospital encounter of 08/03/17  Lipase, blood  Result Value Ref Range   Lipase 17 11 - 51 U/L  Comprehensive metabolic panel  Result Value Ref Range   Sodium 139 135 - 145 mmol/L   Potassium 3.2 (L) 3.5 - 5.1 mmol/L   Chloride 104 101 - 111 mmol/L   CO2 20 (L) 22 - 32 mmol/L   Glucose, Bld 160 (H) 65 - 99 mg/dL   BUN 15 6 - 20 mg/dL   Creatinine, Ser 1.02 (H) 0.44 - 1.00 mg/dL   Calcium 10.1 8.9 - 10.3 mg/dL   Total Protein 8.3 (H) 6.5 - 8.1 g/dL   Albumin 5.0 3.5 - 5.0 g/dL   AST 21 15 - 41 U/L   ALT 15 14 - 54 U/L   Alkaline Phosphatase 60 38 - 126 U/L   Total Bilirubin 0.4 0.3 - 1.2 mg/dL   GFR calc non Af Amer >60 >60 mL/min   GFR calc Af Amer >60 >60 mL/min   Anion gap 15 5 - 15  CBC  Result Value Ref Range   WBC 14.6 (H) 4.0 - 10.5 K/uL   RBC 4.55 3.87 - 5.11 MIL/uL   Hemoglobin 13.2 12.0 - 15.0 g/dL   HCT 39.2 36.0 - 46.0 %   MCV 86.2 78.0 - 100.0 fL   MCH 29.0 26.0 - 34.0 pg   MCHC 33.7 30.0 - 36.0 g/dL   RDW 15.6 (H) 11.5 - 15.5 %   Platelets 421 (H) 150 - 400 K/uL  Urinalysis, Routine w reflex microscopic  Result Value Ref Range   Color, Urine RED (A) YELLOW   APPearance HAZY (A) CLEAR   Specific Gravity, Urine >1.046 (H) 1.005 - 1.030   pH 6.0 5.0 - 8.0   Glucose, UA 50 (A) NEGATIVE mg/dL   Hgb urine dipstick LARGE (A) NEGATIVE   Bilirubin Urine NEGATIVE NEGATIVE   Ketones, ur 20 (A) NEGATIVE mg/dL   Protein, ur 100 (A) NEGATIVE mg/dL   Nitrite NEGATIVE NEGATIVE   Leukocytes, UA MODERATE (A) NEGATIVE   RBC / HPF TOO NUMEROUS TO COUNT 0 - 5 RBC/hpf   WBC, UA TOO NUMEROUS TO COUNT 0 - 5 WBC/hpf   Bacteria, UA NONE SEEN NONE SEEN   Squamous Epithelial / LPF 0-5 (A) NONE SEEN  I-Stat beta hCG blood, ED  Result Value Ref Range    I-stat hCG, quantitative <5.0 <5 mIU/mL   Comment 3  Labs Reviewed - No data to display  Study Result   CLINICAL DATA:  Worsening abdominal pain with vomiting and diarrhea beginning about 12 hours ago. Question diverticulitis. History of renal stone disease.  EXAM: CT ABDOMEN AND PELVIS WITH CONTRAST  TECHNIQUE: Multidetector CT imaging of the abdomen and pelvis was performed using the standard protocol following bolus administration of intravenous contrast.  CONTRAST:  100 cc Isovue 300  COMPARISON:  04/29/2017 and multiple previous  FINDINGS: Lower chest: Small hiatal hernia. No active cardiopulmonary disease.  Hepatobiliary: Normal appearance of the liver. Mild fatty change adjacent to the falciform ligament, not significant. No calcified gallstones.  Pancreas: Normal  Spleen: Normal  Adrenals/Urinary Tract: Adrenal glands are normal. Right kidney contains a nonobstructing 2 mm stone in the upper pole. Left kidney contains a few tiny nonobstructing stones. 1 cm cyst in the upper pole. 6 mm stone previously seen in the left renal pelvis has passed into the ureter and is at the L4-5 level. Mild fullness of the left renal collecting system.  Stomach/Bowel: No abnormal bowel finding. The patient does not have visible diverticulosis.  Vascular/Lymphatic: Normal  Reproductive: Normal except for left-sided uterine fibroid.  Other: No free fluid or air.  Musculoskeletal: Spinal curvature and lower lumbar degenerative changes.  IMPRESSION: 6 mm stone previously seen in the left renal collecting system has passed into the ureter to the level of L4-5. Mild fullness of the left renal collecting system and proximal ureter.  No evidence of acute bowel pathology.  Spinal curvature and lower lumbar degenerative changes.   Electronically Signed   By: Nelson Chimes M.D.   On: 08/03/2017 19:31       ASSESSMENT & PLAN:  1.  Intractable cyclical vomiting with nausea   2. Epigastric pain     Meds ordered this encounter  Medications  . ondansetron (ZOFRAN-ODT) disintegrating tablet 4 mg  . pantoprazole (PROTONIX) 40 MG tablet    Sig: Take 1 tablet (40 mg total) by mouth 2 (two) times daily.    Dispense:  60 tablet    Refill:  0  . metoCLOPramide (REGLAN) 10 MG tablet    Sig: Take 1 tablet (10 mg total) by mouth every 6 (six) hours as needed for nausea (nausea/headache).    Dispense:  10 tablet    Refill:  3    Reviewed expectations re: course of current medical issues. Questions answered. Outlined signs and symptoms indicating need for more acute intervention. Patient verbalized understanding. After Visit Summary given.   Patient left when she found we did not have Protonix.  I tried to get her to wait until her urine test was completed, but she insisted on leaving.     Robyn Haber, MD 08/06/17 1701

## 2017-08-06 NOTE — Discharge Instructions (Signed)
If the vomiting persists, you will need to be seen in the emergency department since we can only prescribe medicine by mouth here for your problem.  Please try to see a gastroenterologist for follow up, two different ones are listed below.  It's difficult to make a good diagnosis today because we don't have all the tests that we would need. Your CAT scan the other day did show a small stone in the left kidney area, but this is not consistent with your symptoms. You also showed some evidence for a urinary tract infection several days ago which was not treated.

## 2017-08-06 NOTE — ED Triage Notes (Signed)
PT has several chronic abdominal problems. PT reports severe epigastric pain for 4 days. PT was seen in [facility]  Friday and had a workup. PT reports she was prescribed phenergan, but she isn't able to keep it down. PT reports several episodes of vomiting in last 24 hours. PT requests pain and nausea meds in triage.

## 2017-08-09 ENCOUNTER — Encounter (HOSPITAL_COMMUNITY): Payer: Self-pay | Admitting: Emergency Medicine

## 2017-08-09 ENCOUNTER — Emergency Department (HOSPITAL_COMMUNITY)
Admission: EM | Admit: 2017-08-09 | Discharge: 2017-08-09 | Disposition: A | Discharge status: Home / Self Care | Payer: Self-pay | Attending: Emergency Medicine | Admitting: Emergency Medicine

## 2017-08-09 DIAGNOSIS — R454 Irritability and anger: Secondary | ICD-10-CM | POA: Insufficient documentation

## 2017-08-09 DIAGNOSIS — Z5321 Procedure and treatment not carried out due to patient leaving prior to being seen by health care provider: Secondary | ICD-10-CM | POA: Insufficient documentation

## 2017-08-09 DIAGNOSIS — R109 Unspecified abdominal pain: Secondary | ICD-10-CM

## 2017-08-09 DIAGNOSIS — R1084 Generalized abdominal pain: Secondary | ICD-10-CM | POA: Insufficient documentation

## 2017-08-09 DIAGNOSIS — R45851 Suicidal ideations: Secondary | ICD-10-CM | POA: Insufficient documentation

## 2017-08-09 DIAGNOSIS — G8929 Other chronic pain: Secondary | ICD-10-CM | POA: Insufficient documentation

## 2017-08-09 DIAGNOSIS — E860 Dehydration: Secondary | ICD-10-CM | POA: Insufficient documentation

## 2017-08-09 DIAGNOSIS — F17210 Nicotine dependence, cigarettes, uncomplicated: Secondary | ICD-10-CM | POA: Insufficient documentation

## 2017-08-09 LAB — COMPREHENSIVE METABOLIC PANEL
ALBUMIN: 5.3 g/dL — AB (ref 3.5–5.0)
ALK PHOS: 56 U/L (ref 38–126)
ALT: 16 U/L (ref 14–54)
AST: 19 U/L (ref 15–41)
Anion gap: 20 — ABNORMAL HIGH (ref 5–15)
BILIRUBIN TOTAL: 1.5 mg/dL — AB (ref 0.3–1.2)
BUN: 32 mg/dL — AB (ref 6–20)
CALCIUM: 9.8 mg/dL (ref 8.9–10.3)
CO2: 21 mmol/L — ABNORMAL LOW (ref 22–32)
Chloride: 92 mmol/L — ABNORMAL LOW (ref 101–111)
Creatinine, Ser: 1.29 mg/dL — ABNORMAL HIGH (ref 0.44–1.00)
GFR calc Af Amer: >60 mL/min (ref >60)
GFR calc non Af Amer: 54 mL/min — ABNORMAL LOW (ref >60)
GLUCOSE: 122 mg/dL — AB (ref 65–99)
Potassium: 3 mmol/L — ABNORMAL LOW (ref 3.5–5.1)
SODIUM: 133 mmol/L — AB (ref 135–145)
TOTAL PROTEIN: 8.2 g/dL — AB (ref 6.5–8.1)

## 2017-08-09 LAB — I-STAT BETA HCG BLOOD, ED (MC, WL, AP ONLY)

## 2017-08-09 LAB — LIPASE, BLOOD: Lipase: 20 U/L (ref 11–51)

## 2017-08-09 LAB — RAPID URINE DRUG SCREEN, HOSP PERFORMED
Amphetamines: NOT DETECTED
BENZODIAZEPINES: NOT DETECTED
Barbiturates: NOT DETECTED
Cocaine: NOT DETECTED
OPIATES: NOT DETECTED
Tetrahydrocannabinol: POSITIVE — AB

## 2017-08-09 LAB — CBC
HCT: 41.1 % (ref 36.0–46.0)
HEMOGLOBIN: 14.2 g/dL (ref 12.0–15.0)
MCH: 29.2 pg (ref 26.0–34.0)
MCHC: 34.5 g/dL (ref 30.0–36.0)
MCV: 84.4 fL (ref 78.0–100.0)
Platelets: 369 10*3/uL (ref 150–400)
RBC: 4.87 MIL/uL (ref 3.87–5.11)
RDW: 14.8 % (ref 11.5–15.5)
WBC: 14 10*3/uL — ABNORMAL HIGH (ref 4.0–10.5)

## 2017-08-09 LAB — ETHANOL: Alcohol, Ethyl (B): <5 mg/dL (ref <5)

## 2017-08-09 LAB — ACETAMINOPHEN LEVEL

## 2017-08-09 LAB — SALICYLATE LEVEL: Salicylate Lvl: <7 mg/dL (ref 2.8–30.0)

## 2017-08-09 MED ORDER — SODIUM CHLORIDE 0.9 % IV BOLUS (SEPSIS)
2000.0000 mL | Freq: Once | INTRAVENOUS | Status: AC
  Administered 2017-08-09: 2000 mL via INTRAVENOUS

## 2017-08-09 MED ORDER — METOCLOPRAMIDE HCL 10 MG PO TABS: 10.0000 mg | ORAL_TABLET | Freq: Four times a day (QID) | ORAL | 3 refills | Status: DC | PRN

## 2017-08-09 MED ORDER — PROCHLORPERAZINE 25 MG RE SUPP: 25.0000 mg | Freq: Two times a day (BID) | RECTAL | 0 refills | Status: DC | PRN

## 2017-08-09 MED ORDER — KETOROLAC TROMETHAMINE 15 MG/ML IJ SOLN
15.0000 mg | Freq: Once | INTRAMUSCULAR | Status: AC
  Administered 2017-08-09: 15 mg via INTRAVENOUS
  Filled 2017-08-09: qty 1

## 2017-08-09 MED ORDER — DIPHENHYDRAMINE HCL 50 MG/ML IJ SOLN
12.5000 mg | Freq: Once | INTRAMUSCULAR | Status: AC
  Administered 2017-08-09: 12.5 mg via INTRAVENOUS
  Filled 2017-08-09: qty 1

## 2017-08-09 MED ORDER — METOCLOPRAMIDE HCL 5 MG/ML IJ SOLN
10.0000 mg | Freq: Once | INTRAMUSCULAR | Status: AC
  Administered 2017-08-09: 10 mg via INTRAVENOUS
  Filled 2017-08-09: qty 2

## 2017-08-09 MED ORDER — ONDANSETRON HCL 4 MG PO TABS: 4.0000 mg | ORAL_TABLET | Freq: Four times a day (QID) | ORAL | 0 refills | Status: DC

## 2017-08-09 MED ORDER — PANTOPRAZOLE SODIUM 40 MG IV SOLR
40.0000 mg | Freq: Once | INTRAVENOUS | Status: AC
  Administered 2017-08-09: 40 mg via INTRAVENOUS
  Filled 2017-08-09: qty 40

## 2017-08-09 MED ORDER — HALOPERIDOL LACTATE 5 MG/ML IJ SOLN
2.0000 mg | Freq: Once | INTRAMUSCULAR | Status: AC
  Administered 2017-08-09: 2 mg via INTRAVENOUS
  Filled 2017-08-09: qty 1

## 2017-08-09 NOTE — ED Notes (Signed)
Spoke with Roderic Palau, MD regarding pt status, complaint, and current ordered placed. Per Roderic Palau, MD no other orders needed at present time.

## 2017-08-09 NOTE — ED Provider Notes (Signed)
[place] DEPT Provider Note   CSN: [id number] Arrival date & time: 08/09/17  1158     History   Chief Complaint Chief Complaint  Patient presents with  . Abdominal Pain  . Suicidal    HPI Jeanne Mcneil is a 31 y.o. female.  HPI Patient is a 31 year old female with a long-standing history of recurrent abdominal pain and what sounds like gastroparesis.  She presents the emergency department with increasing abdominal pain and nausea vomiting over the past several days.  Denies hematemesis.  No melena or hematochezia.  She states she is tired of living with this and has had no significant answers as an outpatient.  She previously followed with the GI team in [place] but recently had her relationship terminated by the clinic.  She is not on chronic opioid medication.  Mother took out IVC paperwork on the patient because she reports the patient is deteriorating from a medical standpoint and believes that she may be purging on purpose 4-5 times a day.  Family is concerned about her decreasing personal hygiene and her poor sleep cycle and poor eating habits.  Family reports that she seems constantly fatigued in the mother is concerned that her daughter is now receiving appropriate medical care and feels like she cannot continue to live like this given her severe nausea vomiting and GI symptoms.  The patient is not homicidal or suicidal.  She has no hallucinations.   Past Medical History:  Diagnosis Date  . Anxiety   . Back pain   . Constipation   . Depression   . GERD (gastroesophageal reflux disease)   . Glaucoma of both eyes   . Hematuria   . History of abnormal cervical Pap smear   . History of chronic gastritis   . History of kidney stones   . Hydronephrosis, right   . Renal calculi    bilateral per ct 0902-2017  . Right ureteral stone   . Urgency of urination   . Uterine fibroid   . Uterine polyp     Patient Active Problem List   Diagnosis Date Noted  . AKI  (acute kidney injury) ([place]) 06/13/2017  . Hematuria 06/13/2017  . Hypomagnesemia 04/29/2017  . Cannabinoid hyperemesis syndrome ([place]) 04/29/2017  . Acute colitis 04/29/2017  . Abdominal pain   . Normochromic normocytic anemia   . Nausea & vomiting 04/02/2017  . Acute renal failure ([place]) 01/16/2017  . Hyponatremia 01/16/2017  . Leukocytosis 01/16/2017  . Nausea and vomiting 01/15/2017  . Chest pain 01/15/2017  . Pyelonephritis 10/11/2016  . Hypokalemia 10/11/2016  . Hyperglycemia 10/11/2016  . Elevated blood pressure reading 10/11/2016  . Left nephrolithiasis 10/11/2016  . Renal colic on left side 25/85/2778  . Generalized anxiety disorder 10/11/2016  . Nausea with vomiting 07/26/2016  . Ureteral stone 07/04/2016  . UTI (urinary tract infection) 07/04/2016  . Hydronephrosis of right kidney 07/04/2016  . GERD (gastroesophageal reflux disease) 07/04/2016  . Ureteral calculi 07/04/2016  . Abdominal pain, chronic, epigastric   . Hematemesis 05/16/2016  . Heme + stool 05/16/2016  . Abdominal pain, epigastric 05/16/2016    Past Surgical History:  Procedure Laterality Date  . CYSTOSCOPY W/ URETERAL STENT PLACEMENT Right 07/04/2016   Procedure: CYSTOSCOPY WITH RIGHT RETROGRADE PYELOGRAM, RIGHT URETERAL STENT PLACEMENT;  Surgeon: Ardis Hughs, MD;  Location: [facility];  Service: Urology;  Laterality: Right;  . CYSTOSCOPY WITH RETROGRADE PYELOGRAM, URETEROSCOPY AND STENT PLACEMENT Left 11/28/2007  . CYSTOSCOPY WITH RETROGRADE PYELOGRAM, URETEROSCOPY AND STENT PLACEMENT  Right 08/02/2016   Procedure: CYSTOSCOPY WITH RIGHT  RETROGRADE PYELOGRAM, URETEROSCOPY,  AND STENT EXCHANGE;  Surgeon: Ardis Hughs, MD;  Location: [facility];  Service: Urology;  Laterality: Right;  . ESOPHAGOGASTRODUODENOSCOPY N/A 05/17/2016   Dr. Oneida Alar: LA Grade A esophagitis, mild gastritis s/p biopsy noting reactive gastritis, medium sized hiatal hernia, next EGD WITH PROPOFOL   . INDUCED  ABORTION  01/24/08    OB History    Gravida Para Term Preterm AB Living   3 2 2   1 2    SAB TAB Ectopic Multiple Live Births   1       2       Home Medications    Prior to Admission medications   Medication Sig Start Date End Date Taking? Authorizing Provider  acetaminophen (TYLENOL) 500 MG tablet Take 500 mg by mouth every 6 (six) hours as needed for mild pain.   Yes [provider]  pantoprazole (PROTONIX) 40 MG tablet Take 1 tablet (40 mg total) by mouth 2 (two) times daily. 08/06/17  Yes Robyn Haber, MD  promethazine (PHENERGAN) 25 MG tablet Take 1 tablet (25 mg total) by mouth every 6 (six) hours as needed for nausea or vomiting. 07/10/17  Yes Pollina, Gwenyth Allegra, MD  ranitidine (ZANTAC) 150 MG tablet Take 150 mg by mouth 2 (two) times daily.   Yes [provider]  metoCLOPramide (REGLAN) 10 MG tablet Take 1 tablet (10 mg total) by mouth every 6 (six) hours as needed for nausea (nausea/headache). 08/09/17   Jola Schmidt, MD  ondansetron (ZOFRAN) 4 MG tablet Take 1 tablet (4 mg total) by mouth every 6 (six) hours. 08/09/17   Jola Schmidt, MD  prochlorperazine (COMPAZINE) 25 MG suppository Place 1 suppository (25 mg total) rectally every 12 (twelve) hours as needed for nausea or vomiting. 08/09/17   Jola Schmidt, MD    Family History Family History  Problem Relation Age of Onset  . Colon cancer Paternal Uncle        Passed away 01/23/15  . Nephrolithiasis Father   . Hypertension Father   . Nephrolithiasis Sister   . Nephrolithiasis Sister   . Nephrolithiasis Sister   . Hypertension Mother     Social History Social History  Substance Use Topics  . Smoking status: Current Every Day Smoker    Packs/day: 0.50    Years: 20.00    Types: Cigarettes  . Smokeless tobacco: Never Used     Comment: 5-7 cigarettes daily   . Alcohol use No     Comment: states has not used in 2 years     Allergies   Keflex [cephalexin]; Codeine; Tape; and Vicodin  [hydrocodone-acetaminophen]   Review of Systems Review of Systems  All other systems reviewed and are negative.    Physical Exam Updated Vital Signs BP (!) 157/112   Pulse (!) 106   Temp (!) 97.5 F (36.4 C) (Oral)   Resp (!) 22   Wt 57.2 kg (126 lb)   LMP 08/03/2017   SpO2 97%   BMI 23.05 kg/m   Physical Exam  Constitutional: She is oriented to person, place, and time. She appears well-developed and well-nourished. No distress.  HENT:  Head: Normocephalic and atraumatic.  Eyes: EOM are normal.  Neck: Normal range of motion.  Cardiovascular: Normal rate and regular rhythm.   Pulmonary/Chest: Effort normal and breath sounds normal.  Abdominal: Soft. She exhibits no distension. There is no tenderness.  Musculoskeletal: Normal range of motion.  Neurological: She is alert and oriented to person, place, and time.  Skin: Skin is warm and dry.  Psychiatric: She has a normal mood and affect. Judgment normal.  Nursing note and vitals reviewed.    ED Treatments / Results  Labs (all labs ordered are listed, but only abnormal results are displayed) Labs Reviewed  ACETAMINOPHEN LEVEL - Abnormal; Notable for the following:       Result Value   Acetaminophen (Tylenol), Serum <10 (*)    All other components within normal limits  RAPID URINE DRUG SCREEN, HOSP PERFORMED - Abnormal; Notable for the following:    Tetrahydrocannabinol POSITIVE (*)    All other components within normal limits  ETHANOL  SALICYLATE LEVEL    EKG  EKG Interpretation None       Radiology No results found.  Procedures Procedures (including critical care time)  Medications Ordered in ED Medications  metoCLOPramide (REGLAN) injection 10 mg (10 mg Intravenous Given 08/09/17 1502)  sodium chloride 0.9 % bolus 2,000 mL (2,000 mLs Intravenous New Bag/Given 08/09/17 1501)  haloperidol lactate (HALDOL) injection 2 mg (2 mg Intravenous Given 08/09/17 1501)  ketorolac (TORADOL) 15 MG/ML injection 15 mg  (15 mg Intravenous Given 08/09/17 1505)  diphenhydrAMINE (BENADRYL) injection 12.5 mg (12.5 mg Intravenous Given 08/09/17 1502)  pantoprazole (PROTONIX) injection 40 mg (40 mg Intravenous Given 08/09/17 1507)     Initial Impression / Assessment and Plan / ED Course  I have reviewed the triage vital signs and the nursing notes.  Pertinent labs & imaging results that were available during my care of the patient were reviewed by me and considered in my medical decision making (see chart for details).    5:13 PM Patient feels much better in the emergency department.  She is resting comfortably at this time.  This appears to be gastroparesis.  She will benefit from referral to the digestive [facility] [place] to their motility clinic.  She reports she's had endoscopies and colonoscopies before in the past without clear etiology is found.  Her repeat abdominal exam is without tenderness.  She does not need to be acutely hospitalized in a psychiatric hospital.  She has not homicidal or suicidal.  She is not having hallucinations or an acute mental health illness.  I discussed this at length with the patient's mother who obviously is frustrated with the patient's recurrent medical condition.  Mother is concerned that she will struggle with follow-up given her lack of insurance.  I have asked the mother to help support her daughter emotionally as well as financially to get her the outpatient help that she needs.  Patient understands return to the emergency department for new or worsening symptoms  Final Clinical Impressions(s) / ED Diagnoses   Final diagnoses:  Chronic abdominal pain  Dehydration    New Prescriptions Current Discharge Medication List       Jola Schmidt, MD 08/09/17 1714

## 2017-08-09 NOTE — ED Notes (Signed)
Lights dimmed. Sitter at bedside. Patient reports "she just wants the pain to end"

## 2017-08-09 NOTE — ED Notes (Signed)
PT would not stop moving during vitals

## 2017-08-09 NOTE — ED Notes (Signed)
Pt very irate and upset saying that "ya'll dont care about people" "how can you do this?" when asked to wait in the waiting room. I explained that care would be started and that blood work would be drawn and physician evaluation was needed before pain medication could be given. Family understood and was trying to calm pt down in waiting room.

## 2017-08-09 NOTE — ED Triage Notes (Signed)
Pt states, "I need to be IVC'ed. My mom has gone to the magistrate."  Pt reports LWBS from [place] earlier today, then going to [place] and being sent here.  Pt  states, "I'm dying. I can't keep living with this pain. I'm killing myself by making myself throw up all the time."  Pt tearful.

## 2017-08-09 NOTE — ED Triage Notes (Addendum)
Per [facility] pt complaint of abdominal pain with associated n/v/d AND pain with urination and frequency onset 9 days ago. Recently diagnosed with UTI.

## 2017-08-09 NOTE — ED Notes (Signed)
Pt in bathroom retching with sitter outside.

## 2017-08-09 NOTE — ED Notes (Signed)
IVC paperwork discontinued per dr Venora Maples paperwork signed and faxed

## 2017-10-31 DIAGNOSIS — K5792 Diverticulitis of intestine, part unspecified, without perforation or abscess without bleeding: Secondary | ICD-10-CM

## 2017-10-31 HISTORY — DX: Diverticulitis of intestine, part unspecified, without perforation or abscess without bleeding: K57.92

## 2017-11-04 ENCOUNTER — Emergency Department (HOSPITAL_COMMUNITY)
Admission: EM | Admit: 2017-11-04 | Discharge: 2017-11-04 | Disposition: A | Discharge status: Home / Self Care | Payer: Self-pay | Attending: Emergency Medicine | Admitting: Emergency Medicine

## 2017-11-04 ENCOUNTER — Encounter (HOSPITAL_COMMUNITY): Payer: Self-pay | Admitting: Emergency Medicine

## 2017-11-04 DIAGNOSIS — G8929 Other chronic pain: Secondary | ICD-10-CM

## 2017-11-04 DIAGNOSIS — F17210 Nicotine dependence, cigarettes, uncomplicated: Secondary | ICD-10-CM | POA: Insufficient documentation

## 2017-11-04 DIAGNOSIS — R109 Unspecified abdominal pain: Secondary | ICD-10-CM | POA: Insufficient documentation

## 2017-11-04 DIAGNOSIS — R1084 Generalized abdominal pain: Secondary | ICD-10-CM

## 2017-11-04 DIAGNOSIS — Z79899 Other long term (current) drug therapy: Secondary | ICD-10-CM | POA: Insufficient documentation

## 2017-11-04 MED ORDER — PROMETHAZINE HCL 25 MG/ML IJ SOLN
25.0000 mg | Freq: Once | INTRAMUSCULAR | Status: AC
  Administered 2017-11-04: 25 mg via INTRAMUSCULAR
  Filled 2017-11-04: qty 1

## 2017-11-04 NOTE — ED Triage Notes (Signed)
Per EMS. Pt has been vomiting x 2 days.  Family states she has been in the bathtub x 24 hours. Pt has been inducing vomiting with EMS by putting her hand down her throat.  Pt not sitting still. Won't stay in the room.  Walking up and down hall.  Pt told she has to go to room to be seen by a physician.

## 2017-11-04 NOTE — ED Notes (Signed)
Pt called ride. Wants to go home and take a bath.

## 2017-11-04 NOTE — ED Provider Notes (Signed)
[facility] EMERGENCY DEPARTMENT Provider Note   CSN: [id number] Arrival date & time: 11/04/17  0010     History   Chief Complaint Chief Complaint  Patient presents with  . Emesis    HPI Jeanne Mcneil is a 31 y.o. female.  Patient who is well known to the emergency department presents with abdominal pain and vomiting since earlier today. No fever or hematemesis. She has a history of chronic abdominal pain, kidney stones, gastris, anxiety. She reports her pain is in the abdomen "all over". No SOB, cough, urinary symptoms. She reports generalized body cramps and "I know my potassium is low, low because of the cramping." No diarrhea.    The history is provided by the patient. No language interpreter was used.  Emesis   Associated symptoms include abdominal pain. Pertinent negatives include no chills, no diarrhea and no fever.    Past Medical History:  Diagnosis Date  . Anxiety   . Back pain   . Constipation   . Depression   . GERD (gastroesophageal reflux disease)   . Glaucoma of both eyes   . Hematuria   . History of abnormal cervical Pap smear   . History of chronic gastritis   . History of kidney stones   . Hydronephrosis, right   . Renal calculi    bilateral per ct 0902-2017  . Right ureteral stone   . Urgency of urination   . Uterine fibroid   . Uterine polyp     Patient Active Problem List   Diagnosis Date Noted  . AKI (acute kidney injury) ([place]) 06/13/2017  . Hematuria 06/13/2017  . Hypomagnesemia 04/29/2017  . Cannabinoid hyperemesis syndrome ([place]) 04/29/2017  . Acute colitis 04/29/2017  . Abdominal pain   . Normochromic normocytic anemia   . Nausea & vomiting 04/02/2017  . Acute renal failure ([place]) 01/16/2017  . Hyponatremia 01/16/2017  . Leukocytosis 01/16/2017  . Nausea and vomiting 01/15/2017  . Chest pain 01/15/2017  . Pyelonephritis 10/11/2016  . Hypokalemia 10/11/2016  . Hyperglycemia 10/11/2016  . Elevated blood  pressure reading 10/11/2016  . Left nephrolithiasis 10/11/2016  . Renal colic on left side 47/65/4650  . Generalized anxiety disorder 10/11/2016  . Nausea with vomiting 07/26/2016  . Ureteral stone 07/04/2016  . UTI (urinary tract infection) 07/04/2016  . Hydronephrosis of right kidney 07/04/2016  . GERD (gastroesophageal reflux disease) 07/04/2016  . Ureteral calculi 07/04/2016  . Abdominal pain, chronic, epigastric   . Hematemesis 05/16/2016  . Heme + stool 05/16/2016  . Abdominal pain, epigastric 05/16/2016    Past Surgical History:  Procedure Laterality Date  . CYSTOSCOPY W/ URETERAL STENT PLACEMENT Right 07/04/2016   Procedure: CYSTOSCOPY WITH RIGHT RETROGRADE PYELOGRAM, RIGHT URETERAL STENT PLACEMENT;  Surgeon: Ardis Hughs, MD;  Location: [facility];  Service: Urology;  Laterality: Right;  . CYSTOSCOPY WITH RETROGRADE PYELOGRAM, URETEROSCOPY AND STENT PLACEMENT Left 11/28/2007  . CYSTOSCOPY WITH RETROGRADE PYELOGRAM, URETEROSCOPY AND STENT PLACEMENT Right 08/02/2016   Procedure: CYSTOSCOPY WITH RIGHT  RETROGRADE PYELOGRAM, URETEROSCOPY,  AND STENT EXCHANGE;  Surgeon: Ardis Hughs, MD;  Location: [facility];  Service: Urology;  Laterality: Right;  . ESOPHAGOGASTRODUODENOSCOPY N/A 05/17/2016   Dr. Oneida Alar: LA Grade A esophagitis, mild gastritis s/p biopsy noting reactive gastritis, medium sized hiatal hernia, next EGD WITH PROPOFOL   . INDUCED ABORTION  2009    OB History    Gravida Para Term Preterm AB Living   3 2 2   1  2  SAB TAB Ectopic Multiple Live Births   1       2       Home Medications    Prior to Admission medications   Medication Sig Start Date End Date Taking? Authorizing Provider  acetaminophen (TYLENOL) 500 MG tablet Take 500 mg by mouth every 6 (six) hours as needed for mild pain.    [provider]  metoCLOPramide (REGLAN) 10 MG tablet Take 1 tablet (10 mg total) by mouth every 6 (six) hours as needed for nausea  (nausea/headache). 08/09/17   Jola Schmidt, MD  ondansetron (ZOFRAN) 4 MG tablet Take 1 tablet (4 mg total) by mouth every 6 (six) hours. 08/09/17   Jola Schmidt, MD  pantoprazole (PROTONIX) 40 MG tablet Take 1 tablet (40 mg total) by mouth 2 (two) times daily. 08/06/17   Robyn Haber, MD  prochlorperazine (COMPAZINE) 25 MG suppository Place 1 suppository (25 mg total) rectally every 12 (twelve) hours as needed for nausea or vomiting. 08/09/17   Jola Schmidt, MD  promethazine (PHENERGAN) 25 MG tablet Take 1 tablet (25 mg total) by mouth every 6 (six) hours as needed for nausea or vomiting. 07/10/17   Pollina, Gwenyth Allegra, MD  ranitidine (ZANTAC) 150 MG tablet Take 150 mg by mouth 2 (two) times daily.    [provider]    Family History Family History  Problem Relation Age of Onset  . Colon cancer Paternal Uncle        Passed away Feb 06, 2015  . Nephrolithiasis Father   . Hypertension Father   . Nephrolithiasis Sister   . Nephrolithiasis Sister   . Nephrolithiasis Sister   . Hypertension Mother     Social History Social History   Tobacco Use  . Smoking status: Current Every Day Smoker    Packs/day: 0.50    Years: 20.00    Pack years: 10.00    Types: Cigarettes  . Smokeless tobacco: Never Used  . Tobacco comment: 5-7 cigarettes daily   Substance Use Topics  . Alcohol use: No    Alcohol/week: 2.4 oz    Types: 4 Shots of liquor per week    Comment: states has not used in 2 years  . Drug use: Yes    Types: Marijuana, Barbituates    Comment: "Only when I feel nauseated and can't function"     Allergies   Keflex [cephalexin]; Codeine; Tape; and Vicodin [hydrocodone-acetaminophen]   Review of Systems Review of Systems  Constitutional: Negative for chills and fever.  Respiratory: Negative.   Cardiovascular: Negative.   Gastrointestinal: Positive for abdominal pain, nausea and vomiting. Negative for diarrhea.  Genitourinary: Negative.   Musculoskeletal: Positive for  back pain.  Skin: Negative.   Neurological: Negative.      Physical Exam Updated Vital Signs BP (!) 141/115   Physical Exam  Constitutional: She is oriented to person, place, and time. She appears well-developed and well-nourished.  HENT:  Head: Normocephalic.  Neck: Normal range of motion. Neck supple.  Cardiovascular: Normal rate and regular rhythm.  No murmur heard. No tachycardia.  Pulmonary/Chest: Effort normal and breath sounds normal.  Abdominal: Soft. Bowel sounds are normal. There is tenderness (Diffuse tenderness to soft abdomen.). There is no rebound and no guarding.  Musculoskeletal: Normal range of motion.  Neurological: She is alert and oriented to person, place, and time.  Skin: Skin is warm and dry.  Psychiatric: She has a normal mood and affect.     ED Treatments / Results  Labs (all labs  ordered are listed, but only abnormal results are displayed) Labs Reviewed  I-STAT CHEM 8, ED  I-STAT BETA HCG BLOOD, ED ([facility], [facility], [facility] ONLY)    EKG  EKG Interpretation None       Radiology No results found.  Procedures Procedures (including critical care time)  Medications Ordered in ED Medications  promethazine (PHENERGAN) injection 25 mg (25 mg Intramuscular Given 11/04/17 0056)     Initial Impression / Assessment and Plan / ED Course  I have reviewed the triage vital signs and the nursing notes.  Pertinent labs & imaging results that were available during my care of the patient were reviewed by me and considered in my medical decision making (see chart for details).     The patient is ambulatory around the department, restless, will not stay in her room. She is insistent that she be able to take a shower but is encouraged to stay in her room to receive evaluation and treatment of her complaints.   VSS. No visualized vomiting. She is back and forth to and from the bathroom. She is given IM Phenergan for symptoms and to help calm her down.   She  refuses to remain in her room. Labs ordered but the patient gets dressed and leaves the department prior to labs being obtained.   Final Clinical Impressions(s) / ED Diagnoses   Final diagnoses:  None   1. Abdominal pain 2. Chronic abdominal pain  ED Discharge Orders    None       Charlann Lange, Hershal Coria 11/04/17 0138    Ezequiel Essex, MD 11/04/17 [phone]

## 2017-11-04 NOTE — ED Notes (Signed)
Pt pacing from room to bathrooms.  Self inducing vomiting with her hand.  Pt will not stay in room, repeatedly asking to "take a shower".  EDPA at bedside.  Pt removed monitoring equipment and continues to walk from room to bathroom and back.

## 2017-11-04 NOTE — ED Notes (Signed)
Pt dressed and stating she is leaving. EDPA aware.

## 2017-11-04 NOTE — ED Notes (Addendum)
Pt redirected back to room multiple times.  Pt continues to induce vomiting in restroom. Pt states "im going to have a heart attack"  Pt refused hook up to monitoring equipment as she continues to walk to bathroom and back.  Pt continues to ask to take a shower.  EDPA and RN made pt aware that that is not appropriate at this time in the ED.

## 2017-11-06 ENCOUNTER — Encounter (HOSPITAL_COMMUNITY): Payer: Self-pay

## 2017-11-06 ENCOUNTER — Emergency Department (HOSPITAL_COMMUNITY): Payer: Medicaid Other

## 2017-11-06 ENCOUNTER — Other Ambulatory Visit: Payer: Self-pay

## 2017-11-06 ENCOUNTER — Inpatient Hospital Stay (HOSPITAL_COMMUNITY)
Admission: AD | Admit: 2017-11-06 | Discharge: 2017-11-06 | Disposition: A | Discharge status: Home / Self Care | Payer: Medicaid Other | Source: Ambulatory Visit | Attending: Emergency Medicine | Admitting: Emergency Medicine

## 2017-11-06 ENCOUNTER — Emergency Department (HOSPITAL_COMMUNITY)
Admission: EM | Admit: 2017-11-06 | Discharge: 2017-11-06 | Discharge status: Against Medical Advice | Payer: Self-pay | Attending: Emergency Medicine | Admitting: Emergency Medicine

## 2017-11-06 DIAGNOSIS — K5900 Constipation, unspecified: Secondary | ICD-10-CM | POA: Diagnosis not present

## 2017-11-06 DIAGNOSIS — H409 Unspecified glaucoma: Secondary | ICD-10-CM | POA: Insufficient documentation

## 2017-11-06 DIAGNOSIS — G43A0 Cyclical vomiting, not intractable: Secondary | ICD-10-CM | POA: Insufficient documentation

## 2017-11-06 DIAGNOSIS — F17210 Nicotine dependence, cigarettes, uncomplicated: Secondary | ICD-10-CM | POA: Insufficient documentation

## 2017-11-06 DIAGNOSIS — N179 Acute kidney failure, unspecified: Secondary | ICD-10-CM | POA: Diagnosis not present

## 2017-11-06 DIAGNOSIS — F329 Major depressive disorder, single episode, unspecified: Secondary | ICD-10-CM | POA: Insufficient documentation

## 2017-11-06 DIAGNOSIS — D259 Leiomyoma of uterus, unspecified: Secondary | ICD-10-CM | POA: Diagnosis not present

## 2017-11-06 DIAGNOSIS — G8929 Other chronic pain: Secondary | ICD-10-CM | POA: Insufficient documentation

## 2017-11-06 DIAGNOSIS — K219 Gastro-esophageal reflux disease without esophagitis: Secondary | ICD-10-CM | POA: Diagnosis not present

## 2017-11-06 DIAGNOSIS — Z5321 Procedure and treatment not carried out due to patient leaving prior to being seen by health care provider: Secondary | ICD-10-CM | POA: Insufficient documentation

## 2017-11-06 DIAGNOSIS — Z79899 Other long term (current) drug therapy: Secondary | ICD-10-CM | POA: Diagnosis not present

## 2017-11-06 DIAGNOSIS — Z87442 Personal history of urinary calculi: Secondary | ICD-10-CM | POA: Insufficient documentation

## 2017-11-06 DIAGNOSIS — R102 Pelvic and perineal pain: Secondary | ICD-10-CM

## 2017-11-06 DIAGNOSIS — R1115 Cyclical vomiting syndrome unrelated to migraine: Secondary | ICD-10-CM

## 2017-11-06 DIAGNOSIS — F419 Anxiety disorder, unspecified: Secondary | ICD-10-CM | POA: Insufficient documentation

## 2017-11-06 DIAGNOSIS — E871 Hypo-osmolality and hyponatremia: Secondary | ICD-10-CM | POA: Insufficient documentation

## 2017-11-06 DIAGNOSIS — D72829 Elevated white blood cell count, unspecified: Secondary | ICD-10-CM | POA: Diagnosis not present

## 2017-11-06 DIAGNOSIS — R109 Unspecified abdominal pain: Secondary | ICD-10-CM

## 2017-11-06 DIAGNOSIS — R7989 Other specified abnormal findings of blood chemistry: Secondary | ICD-10-CM

## 2017-11-06 LAB — CBC WITH DIFFERENTIAL/PLATELET
BASOS PCT: 0 %
Basophils Absolute: 0 10*3/uL (ref 0.0–0.1)
Eosinophils Absolute: 0 10*3/uL (ref 0.0–0.7)
Eosinophils Relative: 0 %
HEMATOCRIT: 42.3 % (ref 36.0–46.0)
HEMOGLOBIN: 14.7 g/dL (ref 12.0–15.0)
LYMPHS ABS: 2.1 10*3/uL (ref 0.7–4.0)
LYMPHS PCT: 12 %
MCH: 30.3 pg (ref 26.0–34.0)
MCHC: 34.8 g/dL (ref 30.0–36.0)
MCV: 87.2 fL (ref 78.0–100.0)
MONOS PCT: 6 %
Monocytes Absolute: 1.1 10*3/uL — ABNORMAL HIGH (ref 0.1–1.0)
NEUTROS ABS: 15.4 10*3/uL — AB (ref 1.7–7.7)
NEUTROS PCT: 82 %
Platelets: 326 10*3/uL (ref 150–400)
RBC: 4.85 MIL/uL (ref 3.87–5.11)
RDW: 14.7 % (ref 11.5–15.5)
WBC: 18.6 10*3/uL — ABNORMAL HIGH (ref 4.0–10.5)

## 2017-11-06 LAB — COMPREHENSIVE METABOLIC PANEL
ALK PHOS: 72 U/L (ref 38–126)
ALT: 28 U/L (ref 14–54)
AST: 52 U/L — ABNORMAL HIGH (ref 15–41)
Albumin: 5.2 g/dL — ABNORMAL HIGH (ref 3.5–5.0)
Anion gap: 20 — ABNORMAL HIGH (ref 5–15)
BILIRUBIN TOTAL: 0.8 mg/dL (ref 0.3–1.2)
BUN: 82 mg/dL — AB (ref 6–20)
CHLORIDE: 82 mmol/L — AB (ref 101–111)
CO2: 27 mmol/L (ref 22–32)
CREATININE: 2.27 mg/dL — AB (ref 0.44–1.00)
Calcium: 9.6 mg/dL (ref 8.9–10.3)
GFR, EST AFRICAN AMERICAN: 31 mL/min — AB (ref >60)
GFR, EST NON AFRICAN AMERICAN: 27 mL/min — AB (ref >60)
GLUCOSE: 122 mg/dL — AB (ref 65–99)
POTASSIUM: 3.9 mmol/L (ref 3.5–5.1)
Sodium: 129 mmol/L — ABNORMAL LOW (ref 135–145)
Total Protein: 8.6 g/dL — ABNORMAL HIGH (ref 6.5–8.1)

## 2017-11-06 LAB — RAPID URINE DRUG SCREEN, HOSP PERFORMED
Amphetamines: NOT DETECTED
BARBITURATES: NOT DETECTED
BENZODIAZEPINES: NOT DETECTED
COCAINE: NOT DETECTED
OPIATES: POSITIVE — AB
Tetrahydrocannabinol: POSITIVE — AB

## 2017-11-06 LAB — URINALYSIS, ROUTINE W REFLEX MICROSCOPIC
BILIRUBIN URINE: NEGATIVE
Glucose, UA: NEGATIVE mg/dL
KETONES UR: NEGATIVE mg/dL
Leukocytes, UA: NEGATIVE
Nitrite: NEGATIVE
PROTEIN: 30 mg/dL — AB
Specific Gravity, Urine: 1.017 (ref 1.005–1.030)
pH: 5 (ref 5.0–8.0)

## 2017-11-06 LAB — CBC
HEMATOCRIT: 41.5 % (ref 36.0–46.0)
Hemoglobin: 15 g/dL (ref 12.0–15.0)
MCH: 30.7 pg (ref 26.0–34.0)
MCHC: 36.1 g/dL — ABNORMAL HIGH (ref 30.0–36.0)
MCV: 84.9 fL (ref 78.0–100.0)
PLATELETS: 389 10*3/uL (ref 150–400)
RBC: 4.89 MIL/uL (ref 3.87–5.11)
RDW: 14.8 % (ref 11.5–15.5)
WBC: 22.4 10*3/uL — ABNORMAL HIGH (ref 4.0–10.5)

## 2017-11-06 LAB — I-STAT BETA HCG BLOOD, ED (MC, WL, AP ONLY): I-stat hCG, quantitative: <5 m[IU]/mL (ref <5)

## 2017-11-06 LAB — LIPASE, BLOOD: Lipase: 23 U/L (ref 11–51)

## 2017-11-06 LAB — WET PREP, GENITAL
SPERM: NONE SEEN
Trich, Wet Prep: NONE SEEN
Yeast Wet Prep HPF POC: NONE SEEN

## 2017-11-06 LAB — POCT PREGNANCY, URINE: Preg Test, Ur: NEGATIVE

## 2017-11-06 MED ORDER — HYDROMORPHONE HCL 1 MG/ML IJ SOLN
1.0000 mg | Freq: Once | INTRAMUSCULAR | Status: AC
  Administered 2017-11-06: 1 mg via INTRAMUSCULAR
  Filled 2017-11-06: qty 1

## 2017-11-06 MED ORDER — SODIUM CHLORIDE 0.9 % IV SOLN
INTRAVENOUS | Status: DC
  Administered 2017-11-06: 18:00:00 via INTRAVENOUS

## 2017-11-06 MED ORDER — SODIUM CHLORIDE 0.9 % IV BOLUS (SEPSIS)
1000.0000 mL | Freq: Once | INTRAVENOUS | Status: AC
  Administered 2017-11-06: 1000 mL via INTRAVENOUS

## 2017-11-06 MED ORDER — ONDANSETRON 4 MG PO TBDP
4.0000 mg | ORAL_TABLET | Freq: Once | ORAL | Status: AC | PRN
  Administered 2017-11-06: 4 mg via ORAL
  Filled 2017-11-06: qty 1

## 2017-11-06 MED ORDER — PANTOPRAZOLE SODIUM 40 MG IV SOLR
40.0000 mg | Freq: Once | INTRAVENOUS | Status: AC
  Administered 2017-11-06: 40 mg via INTRAVENOUS
  Filled 2017-11-06: qty 40

## 2017-11-06 NOTE — ED Notes (Signed)
Pt arrives to ED from [facility] via [facility] with complaints of abdominal pain since 2 weeks; pt states she has not had a BM in 2 weeks. Pt placed in position of comfort with bed locked and lowered, call bell in reach.

## 2017-11-06 NOTE — Discharge Instructions (Signed)
Your blood work today shows that you have a low sodium and that you are dehydrated.  We recommend that you have your blood work rechecked in a week.  Please return without fail for worsening symptoms including confusion, intractable vomiting, escalating pain, passing out, extreme fatigue or weakness, or any other symptoms concerning to you.

## 2017-11-06 NOTE — MAU Provider Note (Signed)
Chief Complaint:  Abdominal Pain   First Provider Initiated Contact with Patient 11/06/17 1608      HPI: Jeanne Mcneil is a 31 y.o. [id number] who presents to maternity admissions reporting severe abdominal pain, fever, vomiting and "kidneys shutting down".  Was at [facility] and left due to long wait.  Was seen 2 days ago at [facility] ED (hx frequent visits for this) and Was seen multiple times making herself vomit.  She then left AMA.  Would not complete evaluation. .States had a fever but did not take temperature. She reports vaginal bleeding, but no vaginal itching/burning, urinary symptoms, h/a, dizziness.    Does have a history of kidney stones, requiring stenting.  Last stent removed last year  Was hospitalized in July 2018 for a similar scenario, diagnosed with Acute Kidney Injury (Cr as high as 3.36), felt to be related to chronic vomiting, dehydration and decreased intake.  This was resolved with hydration.    Was seen by GI but then no-showed 4 appointments and was dismissed from practice  Does have diagnosis of Cyclic Vomiting Syndrome.  Takes frequent hot showers for relief, observed in other hospital admissions.     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  Chart review does show a single fibroid but no polyps. Wants a hysterectomy today.  States has been bleeding quite heavily for a long time  Unable to locate any data on her in  [facility] [place] [facility]    Abdominal Pain  This is a recurrent problem. The current episode started 1 to 4 weeks ago. The problem occurs constantly. The problem has been unchanged. The pain is located in the generalized abdominal region. The pain is severe. The quality of the pain is cramping, sharp and dull. Associated symptoms include nausea, vomiting and weight loss. Pertinent negatives include no constipation, diarrhea, dysuria, fever (states she had sweating the other day, felt it was fever), headaches or myalgias. The pain  is aggravated by palpation. The pain is relieved by nothing. She has tried nothing for the symptoms.    RN Note: Pt states that she has lost 90 lb in the last year and a half. She states that her body has shut down on her. She states "I am dying"  She states the pain she is feeling has been going on for the last 2 years and she states if she does not get help she believes this will kill her.  She is here to see if she can get the polyps and the tumor removed she states she has removed.  She is rating the pain 10/10 constant.     Past Medical History: Past Medical History:  Diagnosis Date  . Anxiety   . Back pain   . Constipation   . Depression   . GERD (gastroesophageal reflux disease)   . Glaucoma of both eyes   . Hematuria   . History of abnormal cervical Pap smear   . History of chronic gastritis   . History of kidney stones   . Hydronephrosis, right   . Renal calculi    bilateral per ct [id number]  . Right ureteral stone   . Urgency of urination   . Uterine fibroid   . Uterine polyp     Past obstetric history: OB History  Gravida Para Term Preterm AB Living  3 2 2   1 2   SAB TAB Ectopic Multiple Live Births  1  2    # Outcome Date GA Lbr Len/2nd Weight Sex Delivery Anes PTL Lv  3 Term 01-29-2006     Vag-Spont   LIV  2 Term January 29, 2002     Vag-Spont   LIV  1 SAB               Past Surgical History: Past Surgical History:  Procedure Laterality Date  . CYSTOSCOPY W/ URETERAL STENT PLACEMENT Right 07/04/2016   Procedure: CYSTOSCOPY WITH RIGHT RETROGRADE PYELOGRAM, RIGHT URETERAL STENT PLACEMENT;  Surgeon: Ardis Hughs, MD;  Location: [facility];  Service: Urology;  Laterality: Right;  . CYSTOSCOPY WITH RETROGRADE PYELOGRAM, URETEROSCOPY AND STENT PLACEMENT Left 11/28/2007  . CYSTOSCOPY WITH RETROGRADE PYELOGRAM, URETEROSCOPY AND STENT PLACEMENT Right 08/02/2016   Procedure: CYSTOSCOPY WITH RIGHT  RETROGRADE PYELOGRAM, URETEROSCOPY,  AND STENT EXCHANGE;  Surgeon:  Ardis Hughs, MD;  Location: [facility];  Service: Urology;  Laterality: Right;  . ESOPHAGOGASTRODUODENOSCOPY N/A 05/17/2016   Dr. Oneida Alar: LA Grade A esophagitis, mild gastritis s/p biopsy noting reactive gastritis, medium sized hiatal hernia, next EGD WITH PROPOFOL   . INDUCED ABORTION  01-30-2008    Family History: Family History  Problem Relation Age of Onset  . Colon cancer Paternal Uncle        Passed away 2015/01/29  . Nephrolithiasis Father   . Hypertension Father   . Nephrolithiasis Sister   . Nephrolithiasis Sister   . Nephrolithiasis Sister   . Hypertension Mother     Social History: Social History   Tobacco Use  . Smoking status: Current Every Day Smoker    Packs/day: 0.50    Years: 20.00    Pack years: 10.00    Types: Cigarettes  . Smokeless tobacco: Never Used  . Tobacco comment: 5-7 cigarettes daily   Substance Use Topics  . Alcohol use: No    Alcohol/week: 2.4 oz    Types: 4 Shots of liquor per week    Comment: states has not used in 2 years  . Drug use: No    Comment: "Only when I feel nauseated and can't function"    Allergies:  Allergies  Allergen Reactions  . Keflex [Cephalexin] Other (See Comments)    Reaction:  Bladder spasms  . Codeine Hives and Itching  . Tape Other (See Comments)    Reaction:  Burning   . Vicodin [Hydrocodone-Acetaminophen] Hives and Itching    Meds:  Medications Prior to Admission  Medication Sig Dispense Refill Last Dose  . acetaminophen (TYLENOL) 500 MG tablet Take 500 mg by mouth every 6 (six) hours as needed for mild pain.   08/08/2017 at Unknown time  . metoCLOPramide (REGLAN) 10 MG tablet Take 1 tablet (10 mg total) by mouth every 6 (six) hours as needed for nausea (nausea/headache). 10 tablet 3   . ondansetron (ZOFRAN) 4 MG tablet Take 1 tablet (4 mg total) by mouth every 6 (six) hours. 12 tablet 0   . pantoprazole (PROTONIX) 40 MG tablet Take 1 tablet (40 mg total) by mouth 2 (two) times daily. 60 tablet  0 08/08/2017 at Unknown time  . prochlorperazine (COMPAZINE) 25 MG suppository Place 1 suppository (25 mg total) rectally every 12 (twelve) hours as needed for nausea or vomiting. 12 suppository 0   . promethazine (PHENERGAN) 25 MG tablet Take 1 tablet (25 mg total) by mouth every 6 (six) hours as needed for nausea or vomiting. 30 tablet 0 08/08/2017 at Unknown time  . ranitidine (ZANTAC) 150 MG tablet Take  150 mg by mouth 2 (two) times daily.   08/08/2017 at Unknown time    I have reviewed patient's Past Medical Hx, Surgical Hx, Family Hx, Social Hx, medications and allergies.  ROS:  Review of Systems  Constitutional: Positive for weight loss. Negative for fever (states she had sweating the other day, felt it was fever).  Gastrointestinal: Positive for abdominal pain, nausea and vomiting. Negative for constipation and diarrhea.  Genitourinary: Negative for dysuria.  Musculoskeletal: Negative for myalgias.  Neurological: Negative for headaches.   Other systems negative     Physical Exam   Patient Vitals for the past 24 hrs:  BP Temp Temp src Pulse Resp SpO2 Height Weight  11/06/17 1520 (!) 121/103 98.5 F (36.9 C) Oral (!) 120 16 100 % 5' 2"  (1.575 m) 111 lb 12 oz (50.7 kg)   Constitutional: Well-developed female, sitting cross-legged in bed, moaning.  Was talking on phone when I entered Cardiovascular: normal rate and rhythm, no ectopy audible, S1 & S2 heard, no murmur Respiratory: normal effort, no distress. Lungs CTAB with no wheezes or crackles GI: Abd soft, diffusely tender.  Nondistended.  No rebound, No guarding.  Bowel Sounds audible   Easily distracted.  Does not have peritoneal signs.  Does report tenderness everywhere I palpated but was not exhibiting guarding or rebound. MS: Extremities nontender, no edema, normal ROM Neurologic: Alert and oriented x 4.   Grossly nonfocal. GU: Neg CVAT. Skin:  Warm and Dry Psych:  Affect appropriate.  PELVIC EXAM: Cervix pink, visually  closed, without lesion, scant red discharge, vaginal walls and external genitalia normal (on menses) Bimanual exam: Cervix firm, anterior, neg CMT, uterus nontender, nonenlarged, adnexa without tenderness, enlargement, or mass    Labs: Results for orders placed or performed during the hospital encounter of 11/06/17 (from the past 24 hour(s))  Urinalysis, Routine w reflex microscopic     Status: Abnormal   Collection Time: 11/06/17  3:06 PM  Result Value Ref Range   Color, Urine YELLOW YELLOW   APPearance CLOUDY (A) CLEAR   Specific Gravity, Urine 1.017 1.005 - 1.030   pH 5.0 5.0 - 8.0   Glucose, UA NEGATIVE NEGATIVE mg/dL   Hgb urine dipstick LARGE (A) NEGATIVE   Bilirubin Urine NEGATIVE NEGATIVE   Ketones, ur NEGATIVE NEGATIVE mg/dL   Protein, ur 30 (A) NEGATIVE mg/dL   Nitrite NEGATIVE NEGATIVE   Leukocytes, UA NEGATIVE NEGATIVE   RBC / HPF TOO NUMEROUS TO COUNT 0 - 5 RBC/hpf   WBC, UA 6-30 0 - 5 WBC/hpf   Bacteria, UA FEW (A) NONE SEEN   Squamous Epithelial / LPF 6-30 (A) NONE SEEN   Mucus PRESENT    Hyaline Casts, UA PRESENT   Urine rapid drug screen (hosp performed)     Status: Abnormal   Collection Time: 11/06/17  3:06 PM  Result Value Ref Range   Opiates POSITIVE (A) NONE DETECTED   Cocaine NONE DETECTED NONE DETECTED   Benzodiazepines NONE DETECTED NONE DETECTED   Amphetamines NONE DETECTED NONE DETECTED   Tetrahydrocannabinol POSITIVE (A) NONE DETECTED   Barbiturates NONE DETECTED NONE DETECTED  Pregnancy, urine POC     Status: None   Collection Time: 11/06/17  3:20 PM  Result Value Ref Range   Preg Test, Ur NEGATIVE NEGATIVE  CBC with Differential/Platelet     Status: Abnormal   Collection Time: 11/06/17  4:08 PM  Result Value Ref Range   WBC 18.6 (H) 4.0 - 10.5 K/uL   RBC 4.85  3.87 - 5.11 MIL/uL   Hemoglobin 14.7 12.0 - 15.0 g/dL   HCT 42.3 36.0 - 46.0 %   MCV 87.2 78.0 - 100.0 fL   MCH 30.3 26.0 - 34.0 pg   MCHC 34.8 30.0 - 36.0 g/dL   RDW 14.7 11.5 - 15.5  %   Platelets 326 150 - 400 K/uL   Neutrophils Relative % 82 %   Neutro Abs 15.4 (H) 1.7 - 7.7 K/uL   Lymphocytes Relative 12 %   Lymphs Abs 2.1 0.7 - 4.0 K/uL   Monocytes Relative 6 %   Monocytes Absolute 1.1 (H) 0.1 - 1.0 K/uL   Eosinophils Relative 0 %   Eosinophils Absolute 0.0 0.0 - 0.7 K/uL   Basophils Relative 0 %   Basophils Absolute 0.0 0.0 - 0.1 K/uL  Wet prep, genital     Status: Abnormal   Collection Time: 11/06/17  4:20 PM  Result Value Ref Range   Yeast Wet Prep HPF POC NONE SEEN NONE SEEN   Trich, Wet Prep NONE SEEN NONE SEEN   Clue Cells Wet Prep HPF POC PRESENT (A) NONE SEEN   WBC, Wet Prep HPF POC FEW (A) NONE SEEN   Sperm NONE SEEN     Imaging:  US Pelvis Transvanginal Non-ob (tv Only)  Result Date: 11/06/2017 CLINICAL DATA:  Severe abdominal pain.  Vomiting for 6 days. EXAM: TRANSABDOMINAL AND TRANSVAGINAL ULTRASOUND OF PELVIS TECHNIQUE: Both transabdominal and transvaginal ultrasound examinations of the pelvis were performed. Transabdominal technique was performed for global imaging of the pelvis including uterus, ovaries, adnexal regions, and pelvic cul-de-sac. It was necessary to proceed with endovaginal exam following the transabdominal exam to visualize the uterus, endometrium and ovaries to better advantage. COMPARISON:  CT, 08/03/2017 FINDINGS: Uterus Measurements: 7.5 x 4.8 x 6.3 cm. There is a mass bulging the left posterior uterine contour consistent with a fibroid. Measures 2.8 x 2.1 x 2.6 cm. No other uterine masses. Endometrium Thickness: 7 mm.  No focal abnormality visualized. Right ovary Measurements: 2.3 x 1.3 x 2.5 cm. Normal appearance/no adnexal mass. Left ovary Measurements: 2.1 x 1.7 x 1.9 cm. Normal appearance/no adnexal mass. Other findings No abnormal free fluid. IMPRESSION: 1. No acute findings. No findings to account for the patient's symptoms. 2. 2.8 cm uterine fibroid.  No other abnormalities. Electronically Signed   By: Lajean Manes M.D.    On: 11/06/2017 17:02   US Pelvis (transabdominal Only)  Result Date: 11/06/2017 CLINICAL DATA:  Severe abdominal pain.  Vomiting for 6 days. EXAM: TRANSABDOMINAL AND TRANSVAGINAL ULTRASOUND OF PELVIS TECHNIQUE: Both transabdominal and transvaginal ultrasound examinations of the pelvis were performed. Transabdominal technique was performed for global imaging of the pelvis including uterus, ovaries, adnexal regions, and pelvic cul-de-sac. It was necessary to proceed with endovaginal exam following the transabdominal exam to visualize the uterus, endometrium and ovaries to better advantage. COMPARISON:  CT, 08/03/2017 FINDINGS: Uterus Measurements: 7.5 x 4.8 x 6.3 cm. There is a mass bulging the left posterior uterine contour consistent with a fibroid. Measures 2.8 x 2.1 x 2.6 cm. No other uterine masses. Endometrium Thickness: 7 mm.  No focal abnormality visualized. Right ovary Measurements: 2.3 x 1.3 x 2.5 cm. Normal appearance/no adnexal mass. Left ovary Measurements: 2.1 x 1.7 x 1.9 cm. Normal appearance/no adnexal mass. Other findings No abnormal free fluid. IMPRESSION: 1. No acute findings. No findings to account for the patient's symptoms. 2. 2.8 cm uterine fibroid.  No other abnormalities. Electronically Signed   By: Shanon Brow  Ormond M.D.   On: 11/06/2017 17:02    MAU Course/MDM: I have ordered labs as follows:  CBC, CMET, UA, Urine drug screen, UPT  >>  WBC is elevated with slight left shift, Creatinine is elevated ?due to dehydration, UDS + for opiates and THC.   + clue cells probably due to menstrual blood, with no odor, would not treat as BV at this point   I did send GC/Chlamydia to rule out those as contributors. Does state she is sexually active. Imaging ordered: Pelvic US >> Showed one small fibroid, no explanation for pain Results reviewed.    Consult Dr Nehemiah Settle who recommends transfer to [facility] ED for evaluation of above results.  Dr Janann August accepts..   Treatments in MAU included NS bolus IV  infusion.  I did give one dose of Dilaudid IM on admission due to pain and elevated WBC so that I could get a better exam.    Exam and hemoglobin level are not consistent with chronic high blood loss from menstrual cycles.   Results discussed with patient.  I do not think she needs a hysterectomy, especially not emergently   There is no GYN source of this pain.  I will get her into GYN clinic for GYN followup to discuss future plans for fibroid.  There are no polyps.  She does have a past Dx of Cyclic Vomiting felt to be related to MJ, but also was seen making herself vomit in ED with her hand.  No vomiting observed here.  Her urine appears more dilute than her story depicts.   Declined pastoral care consult. .   Pt stable at time of transfer.  Assessment: 1. Pelvic pain   2. Pelvic pain   3.     Chronic abdominal pain, no Gynecologic source of pain 4.     Cyclic vomiting syndrome 5.      Leukocytosis with mild left shift 6.      Elevated creatinine 7.      Positive drug screen 8.      Not pregnant, currently menstruating 9.     Hyponatremia  Plan: Transfer to [facility] ED Message sent to clinic for GYN followup  Encouraged to return here or to other Urgent Care/ED if she develops worsening of symptoms, increase in pain, fever, or other concerning symptoms.   Hansel Feinstein CNM, MSN Certified Nurse-Midwife 11/06/2017 4:31 PM

## 2017-11-06 NOTE — ED Notes (Signed)
Called Pt to be roomed, no response x3.

## 2017-11-06 NOTE — MAU Note (Signed)
Pt states that she has lost 90 lb in the last year and a half. She states that her body has shut down on her. She states "I am dying"   She states the pain she is feeling has been going on for the last 2 years and she states if she does not get help she believes this will kill her.   She is here to see if she can get the polyps and the tumor removed she states she has removed.   She is rating the pain 10/10 constant.

## 2017-11-06 NOTE — ED Notes (Signed)
Jeanne Mcneil, NT has called patient for assigned room with no answer.

## 2017-11-06 NOTE — ED Notes (Signed)
Pt took her IV access out and set the catheter on the nurses desk.  While she was pulling the tape off, this nurse asked her to allow me to take it out, pt proceeded to take the IV catheter out.  Explained to the pt that the IV access is not taken out until she has her discharge instructions.

## 2017-11-06 NOTE — ED Provider Notes (Signed)
[facility] EMERGENCY DEPARTMENT Provider Note   CSN: [id number] Arrival date & time: 11/06/17  1457     History   Chief Complaint Chief Complaint  Patient presents with  . Abdominal Pain    HPI Jeanne Mcneil is a 31 y.o. female.  HPI 31 year old female who presents with abdominal pain.  Patient was initially seen at the [facility] earlier today and transferred here for evaluation of hyponatremia and acute kidney injury.  She has a history of chronic abdominal pain and nausea with vomiting, previously seen by Dr. Oneida Alar in [place] from gastroenterology but was subsequently discharged from the clinic.  She reports 6 days of generalized abdominal pain with nausea and vomiting.  Denies fever but is been associated with night sweats.  No urinary complaints, vaginal bleeding or discharge.  Does report that she has been more constipated.  She reports that this is similar to previous bouts of her abdominal pain.  Past Medical History:  Diagnosis Date  . Anxiety   . Back pain   . Constipation   . Depression   . GERD (gastroesophageal reflux disease)   . Glaucoma of both eyes   . Hematuria   . History of abnormal cervical Pap smear   . History of chronic gastritis   . History of kidney stones   . Hydronephrosis, right   . Renal calculi    bilateral per ct 0902-2017  . Right ureteral stone   . Urgency of urination   . Uterine fibroid   . Uterine polyp     Patient Active Problem List   Diagnosis Date Noted  . AKI (acute kidney injury) ([place]) 06/13/2017  . Hematuria 06/13/2017  . Hypomagnesemia 04/29/2017  . Cannabinoid hyperemesis syndrome ([place]) 04/29/2017  . Acute colitis 04/29/2017  . Abdominal pain   . Normochromic normocytic anemia   . Nausea & vomiting 04/02/2017  . Acute renal failure ([place]) 01/16/2017  . Hyponatremia 01/16/2017  . Leukocytosis 01/16/2017  . Nausea and vomiting 01/15/2017  . Chest pain 01/15/2017  . Pyelonephritis  10/11/2016  . Hypokalemia 10/11/2016  . Hyperglycemia 10/11/2016  . Elevated blood pressure reading 10/11/2016  . Left nephrolithiasis 10/11/2016  . Renal colic on left side 93/57/0177  . Generalized anxiety disorder 10/11/2016  . Nausea with vomiting 07/26/2016  . Ureteral stone 07/04/2016  . UTI (urinary tract infection) 07/04/2016  . Hydronephrosis of right kidney 07/04/2016  . GERD (gastroesophageal reflux disease) 07/04/2016  . Ureteral calculi 07/04/2016  . Abdominal pain, chronic, epigastric   . Hematemesis 05/16/2016  . Heme + stool 05/16/2016  . Abdominal pain, epigastric 05/16/2016    Past Surgical History:  Procedure Laterality Date  . CYSTOSCOPY W/ URETERAL STENT PLACEMENT Right 07/04/2016   Procedure: CYSTOSCOPY WITH RIGHT RETROGRADE PYELOGRAM, RIGHT URETERAL STENT PLACEMENT;  Surgeon: Ardis Hughs, MD;  Location: [facility];  Service: Urology;  Laterality: Right;  . CYSTOSCOPY WITH RETROGRADE PYELOGRAM, URETEROSCOPY AND STENT PLACEMENT Left 11/28/2007  . CYSTOSCOPY WITH RETROGRADE PYELOGRAM, URETEROSCOPY AND STENT PLACEMENT Right 08/02/2016   Procedure: CYSTOSCOPY WITH RIGHT  RETROGRADE PYELOGRAM, URETEROSCOPY,  AND STENT EXCHANGE;  Surgeon: Ardis Hughs, MD;  Location: [facility];  Service: Urology;  Laterality: Right;  . ESOPHAGOGASTRODUODENOSCOPY N/A 05/17/2016   Dr. Oneida Alar: LA Grade A esophagitis, mild gastritis s/p biopsy noting reactive gastritis, medium sized hiatal hernia, next EGD WITH PROPOFOL   . INDUCED ABORTION  2009    OB History    Gravida Para Term Preterm AB  Living   3 2 2   1 2    SAB TAB Ectopic Multiple Live Births   1       2       Home Medications    Prior to Admission medications   Medication Sig Start Date End Date Taking? Authorizing Provider  metoCLOPramide (REGLAN) 10 MG tablet Take 1 tablet (10 mg total) by mouth every 6 (six) hours as needed for nausea (nausea/headache). 08/09/17  Yes Jola Schmidt, MD    ondansetron (ZOFRAN) 4 MG tablet Take 1 tablet (4 mg total) by mouth every 6 (six) hours. 08/09/17  Yes Jola Schmidt, MD  pantoprazole (PROTONIX) 40 MG tablet Take 1 tablet (40 mg total) by mouth 2 (two) times daily. 08/06/17  Yes Robyn Haber, MD  promethazine (PHENERGAN) 25 MG tablet Take 1 tablet (25 mg total) by mouth every 6 (six) hours as needed for nausea or vomiting. 07/10/17  Yes Pollina, Gwenyth Allegra, MD  prochlorperazine (COMPAZINE) 25 MG suppository Place 1 suppository (25 mg total) rectally every 12 (twelve) hours as needed for nausea or vomiting. Patient not taking: Reported on 11/06/2017 08/09/17   Jola Schmidt, MD    Family History Family History  Problem Relation Age of Onset  . Colon cancer Paternal Uncle        Passed away 28-Jan-2015  . Nephrolithiasis Father   . Hypertension Father   . Nephrolithiasis Sister   . Nephrolithiasis Sister   . Nephrolithiasis Sister   . Hypertension Mother     Social History Social History   Tobacco Use  . Smoking status: Current Every Day Smoker    Packs/day: 0.50    Years: 20.00    Pack years: 10.00    Types: Cigarettes  . Smokeless tobacco: Never Used  . Tobacco comment: 5-7 cigarettes daily   Substance Use Topics  . Alcohol use: No    Alcohol/week: 2.4 oz    Types: 4 Shots of liquor per week    Comment: states has not used in 2 years  . Drug use: No    Comment: "Only when I feel nauseated and can't function"     Allergies   Keflex [cephalexin]; Nsaids; Codeine; Tape; and Vicodin [hydrocodone-acetaminophen]   Review of Systems Review of Systems   Physical Exam Updated Vital Signs BP 120/78 (BP Location: Right Arm)   Pulse 78   Temp 98.4 F (36.9 C) (Oral)   Resp 16   Ht 5' 2"  (1.575 m)   Wt 50.7 kg (111 lb 12 oz)   LMP 11/06/2017   SpO2 96%   BMI 20.44 kg/m   Physical Exam   ED Treatments / Results  Labs (all labs ordered are listed, but only abnormal results are displayed) Labs Reviewed  WET  PREP, GENITAL - Abnormal; Notable for the following components:      Result Value   Clue Cells Wet Prep HPF POC PRESENT (*)    WBC, Wet Prep HPF POC FEW (*)    All other components within normal limits  URINALYSIS, ROUTINE W REFLEX MICROSCOPIC - Abnormal; Notable for the following components:   APPearance CLOUDY (*)    Hgb urine dipstick LARGE (*)    Protein, ur 30 (*)    Bacteria, UA FEW (*)    Squamous Epithelial / LPF 6-30 (*)    All other components within normal limits  RAPID URINE DRUG SCREEN, HOSP PERFORMED - Abnormal; Notable for the following components:   Opiates POSITIVE (*)    Tetrahydrocannabinol  POSITIVE (*)    All other components within normal limits  CBC WITH DIFFERENTIAL/PLATELET - Abnormal; Notable for the following components:   WBC 18.6 (*)    Neutro Abs 15.4 (*)    Monocytes Absolute 1.1 (*)    All other components within normal limits  BASIC METABOLIC PANEL  POCT PREGNANCY, URINE  GC/CHLAMYDIA PROBE AMP ([place]) NOT AT [facility]    EKG  EKG Interpretation None       Radiology US Pelvis Transvanginal Non-ob (tv Only)  Result Date: 11/06/2017 CLINICAL DATA:  Severe abdominal pain.  Vomiting for 6 days. EXAM: TRANSABDOMINAL AND TRANSVAGINAL ULTRASOUND OF PELVIS TECHNIQUE: Both transabdominal and transvaginal ultrasound examinations of the pelvis were performed. Transabdominal technique was performed for global imaging of the pelvis including uterus, ovaries, adnexal regions, and pelvic cul-de-sac. It was necessary to proceed with endovaginal exam following the transabdominal exam to visualize the uterus, endometrium and ovaries to better advantage. COMPARISON:  CT, 08/03/2017 FINDINGS: Uterus Measurements: 7.5 x 4.8 x 6.3 cm. There is a mass bulging the left posterior uterine contour consistent with a fibroid. Measures 2.8 x 2.1 x 2.6 cm. No other uterine masses. Endometrium Thickness: 7 mm.  No focal abnormality visualized. Right ovary Measurements: 2.3 x 1.3  x 2.5 cm. Normal appearance/no adnexal mass. Left ovary Measurements: 2.1 x 1.7 x 1.9 cm. Normal appearance/no adnexal mass. Other findings No abnormal free fluid. IMPRESSION: 1. No acute findings. No findings to account for the patient's symptoms. 2. 2.8 cm uterine fibroid.  No other abnormalities. Electronically Signed   By: Lajean Manes M.D.   On: 11/06/2017 17:02   US Pelvis (transabdominal Only)  Result Date: 11/06/2017 CLINICAL DATA:  Severe abdominal pain.  Vomiting for 6 days. EXAM: TRANSABDOMINAL AND TRANSVAGINAL ULTRASOUND OF PELVIS TECHNIQUE: Both transabdominal and transvaginal ultrasound examinations of the pelvis were performed. Transabdominal technique was performed for global imaging of the pelvis including uterus, ovaries, adnexal regions, and pelvic cul-de-sac. It was necessary to proceed with endovaginal exam following the transabdominal exam to visualize the uterus, endometrium and ovaries to better advantage. COMPARISON:  CT, 08/03/2017 FINDINGS: Uterus Measurements: 7.5 x 4.8 x 6.3 cm. There is a mass bulging the left posterior uterine contour consistent with a fibroid. Measures 2.8 x 2.1 x 2.6 cm. No other uterine masses. Endometrium Thickness: 7 mm.  No focal abnormality visualized. Right ovary Measurements: 2.3 x 1.3 x 2.5 cm. Normal appearance/no adnexal mass. Left ovary Measurements: 2.1 x 1.7 x 1.9 cm. Normal appearance/no adnexal mass. Other findings No abnormal free fluid. IMPRESSION: 1. No acute findings. No findings to account for the patient's symptoms. 2. 2.8 cm uterine fibroid.  No other abnormalities. Electronically Signed   By: Lajean Manes M.D.   On: 11/06/2017 17:02    Procedures Procedures (including critical care time)  Medications Ordered in ED Medications  0.9 %  sodium chloride infusion ( Intravenous Stopped 11/06/17 1908)  sodium chloride 0.9 % bolus 1,000 mL (not administered)  pantoprazole (PROTONIX) injection 40 mg (not administered)  HYDROmorphone  (DILAUDID) injection 1 mg (1 mg Intramuscular Given 11/06/17 1550)     Initial Impression / Assessment and Plan / ED Course  I have reviewed the triage vital signs and the nursing notes.  Pertinent labs & imaging results that were available during my care of the patient were reviewed by me and considered in my medical decision making (see chart for details).     Records reviewed.  Patient with multiple recurrent visits for nausea,  vomiting and acute on chronic abdominal pain.  Has had multiple CT imaging in the past, with last CT in September 2008 showing nephrolithiasis but no acute intra-abdominal processes.  She feels this is the same.  Her workup today from [facility] is reviewed.  She had a pelvic ultrasound showing fibroid but no other acute processes.  Pelvic exam was performed and wet prep unremarkable.  Blood work was notable for leukocytosis of 18.  Records are reviewed, and she has had leukocytosis with her recurrent abdominal pain nausea and vomiting.  Likely stress-induced.  She does have new hyponatremia with a sodium of 129 as well as acute kidney injury a creatinine of 2.2.  She did receive IV fluids as well as a dose of Dilaudid.  Her symptoms seem to have improved in route.  She has a soft and nonsurgical abdomen.  Pain primarily reported in the upper abdomen.  Given that presentation seems consistent with previous episodes I do not feel repeat CT imaging would be helpful at this time.  She will be given additional IV fluids, observed with p.o. trial.  If hyponatremia and kidney injury improved she will be discharged with outpatient referral for GI.  Patient does feel significantly improved after additional IV fluids and Protonix.  Her abdominal pain is resolved and she has no further vomiting.  She states that she is ready for discharge.  I requested that we repeat her basic metabolic panel to make sure that her kidney injury is improving and her hyponatremia is improving.   However she states that she needs to go home to take care of her daughter and would not want to wait for repeat blood work.  I have advised her to have blood work rechecked in 1 week and to return for any worsening or new symptoms. Strict return and follow-up instructions reviewed. She expressed understanding of all discharge instructions and felt comfortable with the plan of care.   Final Clinical Impressions(s) / ED Diagnoses   Final diagnoses:  Chronic abdominal pain  Leukocytosis, unspecified type  Creatinine elevation  Hyponatremia  Non-intractable cyclical vomiting with nausea  Uterine leiomyoma, unspecified location    ED Discharge Orders    None       Forde Dandy, MD 11/06/17 2143

## 2017-11-06 NOTE — MAU Note (Signed)
I asked the patient if she would like our spiritual care team to come by and see her. The patient declined.

## 2017-11-06 NOTE — ED Triage Notes (Signed)
Per EMS- Patient c/o abdominal pain and vomiting x 6 days.  Patient states a history of diverticulitis.

## 2017-11-08 LAB — GC/CHLAMYDIA PROBE AMP (~~LOC~~) NOT AT ARMC
CHLAMYDIA, DNA PROBE: NEGATIVE
NEISSERIA GONORRHEA: POSITIVE — AB

## 2017-12-04 ENCOUNTER — Inpatient Hospital Stay (HOSPITAL_COMMUNITY)
Admission: AD | Admit: 2017-12-04 | Discharge: 2017-12-04 | Disposition: A | Discharge status: Home / Self Care | Payer: Self-pay | Source: Ambulatory Visit | Attending: Obstetrics and Gynecology | Admitting: Obstetrics and Gynecology

## 2017-12-04 ENCOUNTER — Ambulatory Visit: Payer: Self-pay

## 2017-12-04 ENCOUNTER — Encounter (HOSPITAL_COMMUNITY): Payer: Self-pay | Admitting: *Deleted

## 2017-12-04 DIAGNOSIS — Z3200 Encounter for pregnancy test, result unknown: Secondary | ICD-10-CM

## 2017-12-04 DIAGNOSIS — Z3202 Encounter for pregnancy test, result negative: Secondary | ICD-10-CM

## 2017-12-04 DIAGNOSIS — N912 Amenorrhea, unspecified: Secondary | ICD-10-CM

## 2017-12-04 LAB — POCT PREGNANCY, URINE: PREG TEST UR: NEGATIVE

## 2017-12-04 NOTE — Discharge Instructions (Signed)
Pregnancy Test Information What is a pregnancy test? A pregnancy test is used to detect the presence of human chorionic gonadotropin (hCG) in a sample of your urine or blood. hCG is a hormone produced by the cells of the placenta. The placenta is the organ that forms to nourish and support a developing baby. This test requires a sample of either blood or urine. A pregnancy test determines whether you are pregnant or not. How are pregnancy tests done? Pregnancy tests are done using a home pregnancy test or having a blood or urine test done at your health care provider's office. Home pregnancy tests require a urine sample.  Most kits use a plastic testing device with a strip of paper that indicates whether there is hCG in your urine.  Follow the test instructions very carefully.  After you urinate on the test stick, markings will appear to let you know whether you are pregnant.  For best results, use your first urine of the morning. That is when the concentration of hCG is highest.  Having a blood test to check for pregnancy requires a sample of blood drawn from a vein in your hand or arm. Your health care provider will send your sample to a lab for testing. Results of a pregnancy test will be positive or negative. Is one type of pregnancy test better than another? In some cases, a blood test will return a positive result even if a urine test was negative because blood tests are more sensitive. This means blood tests can detect hCG earlier than home pregnancy tests. How accurate are home pregnancy tests? Both types of pregnancy tests are very accurate.  A blood test is about 98% accurate.  When you are far enough along in your pregnancy and when used correctly, home pregnancy tests are equally accurate.  Can anything interfere with home pregnancy test results It is possible for certain conditions to cause an inaccurate test result (false positive or falsenegative).  A false positive is a  positive test result when you are not pregnant. This can happen if you: ? Are taking certain medicines, including anticonvulsants or tranquilizers. ? Have certain proteins in your blood.  A false negative is a negative test result when you are pregnant. This can happen if you: ? Took the test before there was enough hCG to detect. A pregnancy test will not be positive in most women until 3-4 weeks after conception. ? Drank a lot of liquid before the test. Diluted urine samples can sometimes give an inaccurate result. ? Take certain medicines, such as water pills (diuretics) or some antihistamines.  What should I do if I have a positive pregnancy test? If you have a positive pregnancy test, schedule an appointment with your health care provider. You might need additional testing to confirm the pregnancy. In the meantime, begin taking a prenatal vitamin, stop smoking, stop drinking alcohol, and do not use street drugs. Talk to your health care provider about how to take care of yourself during your pregnancy. Ask about what to expect from the care you will need throughout pregnancy (prenatal care). This information is not intended to replace advice given to you by your health care provider. Make sure you discuss any questions you have with your health care provider. Document Released: 11/09/2003 Document Revised: 10/03/2016 Document Reviewed: 03/03/2014 Elsevier Interactive Patient Education  [employer].

## 2017-12-04 NOTE — MAU Note (Signed)
Pt reports she is 15 days late for her period, negative preg test at home. Feels she is pregnant

## 2017-12-04 NOTE — MAU Provider Note (Signed)
Ms. Jeanne Mcneil is a 32 y.o. D3P1225 who present to MAU today for pregnancy confirmation. She denies abdominal pain or vaginal bleeding.   BP (!) 132/101 (BP Location: Right Arm)   Pulse 81   Temp 97.8 F (36.6 C) (Oral)   Resp 16   Ht 5' 2"  (1.575 m)   Wt 125 lb (56.7 kg)   LMP 11/03/2017   SpO2 99%   BMI 22.86 kg/m  CONSTITUTIONAL: Well-developed, well-nourished female in no acute distress.  CARDIOVASCULAR: Normal heart rate noted RESPIRATORY: Effort and breath sounds normal GASTROINTESTINAL:Soft, no distention noted.  No tenderness, rebound or guarding.  SKIN: Skin is warm and dry. No rash noted. Not diaphoretic. No erythema. No pallor. PSYCHIATRIC: Normal mood and affect. Normal behavior. Normal judgment and thought content.  MDM Medical screening exam complete Patient does not endorse any symptoms concerning for ectopic pregnancy or pregnancy related complication today.   A:  Amenorrhea  P: Discharge home Patient advised that she can present as a walk-in to [facility] for a pregnancy test M-Th between 8am-4pm or Friday between 8am -11am Reasons to return to MAU reviewed  Patient may return to MAU as needed or if her condition were to change or worsen  Wende Mott, [place] 12/04/2017 3:53 PM

## 2017-12-05 LAB — BETA HCG QUANT (REF LAB): hCG Quant: <1 m[IU]/mL

## 2017-12-07 ENCOUNTER — Telehealth: Payer: Self-pay | Admitting: *Deleted

## 2017-12-07 NOTE — Telephone Encounter (Signed)
Pt left message requesting results of her pregnancy test. I returned her call today and left message on her personal voice mail stating that the test was negative for pregnancy. She has office appt on 1/31 @ 1355. She may call back if she has questions.

## 2017-12-20 ENCOUNTER — Encounter: Payer: Self-pay | Admitting: Obstetrics & Gynecology

## 2017-12-20 ENCOUNTER — Ambulatory Visit (INDEPENDENT_AMBULATORY_CARE_PROVIDER_SITE_OTHER): Payer: Self-pay | Admitting: Obstetrics & Gynecology

## 2017-12-20 DIAGNOSIS — A549 Gonococcal infection, unspecified: Secondary | ICD-10-CM | POA: Insufficient documentation

## 2017-12-20 MED ORDER — AZITHROMYCIN 250 MG PO TABS
1000.0000 mg | ORAL_TABLET | Freq: Every day | ORAL | Status: DC
  Administered 2017-12-20: 1000 mg via ORAL

## 2017-12-20 MED ORDER — CEFTRIAXONE SODIUM 250 MG IJ SOLR
250.0000 mg | Freq: Once | INTRAMUSCULAR | Status: AC
  Administered 2017-12-20: 250 mg via INTRAMUSCULAR

## 2017-12-20 MED ORDER — CEFTRIAXONE SODIUM 250 MG IJ SOLR: 250.0000 mg | Freq: Once | INTRAMUSCULAR | 0 refills | Status: DC

## 2017-12-20 NOTE — Progress Notes (Signed)
Patient ID: Jeanne Mcneil, female   DOB: [birth year], 32 y.o.   MRN: [id number]  Chief Complaint  Patient presents with  . Gynecologic Exam  dx with fibroid  HPI Jeanne Mcneil is a 32 y.o. female.  [id number] Patient's last menstrual period was 11/06/2017 (exact date). She has dysmenorrhea and prolonged menses since removal of IUD >2 years ago. Chronic abdominal pain and h/o gastritis, renal stones, nausea. Positive for GC on ED visit 11/06/17 but wasn't notified or treated HPI  Past Medical History:  Diagnosis Date  . Anxiety   . Back pain   . Constipation   . Depression   . GERD (gastroesophageal reflux disease)   . Glaucoma of both eyes   . Hematuria   . History of abnormal cervical Pap smear   . History of chronic gastritis   . History of kidney stones   . Hydronephrosis, right   . Renal calculi    bilateral per ct 0902-2017  . Right ureteral stone   . Urgency of urination   . Uterine fibroid   . Uterine polyp     Past Surgical History:  Procedure Laterality Date  . CYSTOSCOPY W/ URETERAL STENT PLACEMENT Right 07/04/2016   Procedure: CYSTOSCOPY WITH RIGHT RETROGRADE PYELOGRAM, RIGHT URETERAL STENT PLACEMENT;  Surgeon: Ardis Hughs, MD;  Location: [facility];  Service: Urology;  Laterality: Right;  . CYSTOSCOPY WITH RETROGRADE PYELOGRAM, URETEROSCOPY AND STENT PLACEMENT Left 11/28/2007  . CYSTOSCOPY WITH RETROGRADE PYELOGRAM, URETEROSCOPY AND STENT PLACEMENT Right 08/02/2016   Procedure: CYSTOSCOPY WITH RIGHT  RETROGRADE PYELOGRAM, URETEROSCOPY,  AND STENT EXCHANGE;  Surgeon: Ardis Hughs, MD;  Location[facility];  Service: Urology;  Laterality: Right;  . ESOPHAGOGASTRODUODENOSCOPY N/A 05/17/2016   Dr. Oneida Alar: LA Grade A esophagitis, mild gastritis s/p biopsy noting reactive gastritis, medium sized hiatal hernia, next EGD WITH PROPOFOL   . INDUCED ABORTION  01-16-2008    Family History  Problem Relation Age of Onset  . Colon cancer Paternal Uncle       Passed away 15-Jan-2015  . Nephrolithiasis Father   . Hypertension Father   . Nephrolithiasis Sister   . Nephrolithiasis Sister   . Nephrolithiasis Sister   . Hypertension Mother     Social History Social History   Tobacco Use  . Smoking status: Current Every Day Smoker    Packs/day: 1.00    Years: 20.00    Pack years: 20.00    Types: Cigarettes  . Smokeless tobacco: Never Used  . Tobacco comment: 5-7 cigarettes daily   Substance Use Topics  . Alcohol use: No    Alcohol/week: 2.4 oz    Types: 4 Shots of liquor per week    Comment: states has not used in 2 years  . Drug use: No    Comment: "Only when I feel nauseated and can't function"    Allergies  Allergen Reactions  . Keflex [Cephalexin] Other (See Comments)    Reaction:  Bladder spasms  . Nsaids     Stomach bleeding  . Codeine Hives and Itching  . Tape Other (See Comments)    Reaction:  Burning   . Vicodin [Hydrocodone-Acetaminophen] Hives and Itching    Current Outpatient Medications  Medication Sig Dispense Refill  . metoCLOPramide (REGLAN) 10 MG tablet Take 1 tablet (10 mg total) by mouth every 6 (six) hours as needed for nausea (nausea/headache). 10 tablet 3  . pantoprazole (PROTONIX) 40 MG tablet Take 1 tablet (40 mg total) by mouth 2 (  two) times daily. 60 tablet 0  . promethazine (PHENERGAN) 25 MG tablet Take 1 tablet (25 mg total) by mouth every 6 (six) hours as needed for nausea or vomiting. 30 tablet 0  . cefTRIAXone (ROCEPHIN) 250 MG injection Inject 250 mg into the muscle once for 1 dose.  FOR IM use in LARGE MUSCLE MASS 1 each 0   Current Facility-Administered Medications  Medication Dose Route Frequency Provider Last Rate Last Dose  . azithromycin (ZITHROMAX) tablet 1,000 mg  1,000 mg Oral Daily Woodroe Mode, MD        Review of Systems Review of Systems  Constitutional: Negative.   Respiratory: Negative.   Gastrointestinal: Positive for abdominal pain, nausea and vomiting.  Genitourinary:  Positive for dyspareunia, dysuria, frequency, pelvic pain and urgency. Negative for vaginal bleeding and vaginal discharge.    Blood pressure 118/72, pulse 76, height 5' 2"  (1.575 m), weight 122 lb 14.4 oz (55.7 kg), last menstrual period 11/06/2017.  Physical Exam Physical Exam  Constitutional: She appears well-developed. No distress.  Eyes: Pupils are equal, round, and reactive to light.  Cardiovascular: Normal rate.  Pulmonary/Chest: Effort normal. No respiratory distress.  Abdominal: Soft. There is no tenderness. There is no guarding.  Genitourinary: Vagina normal and uterus normal. No vaginal discharge found.  Genitourinary Comments: Minimal tenderness; no mass  Vitals reviewed.   Data Reviewed Lab results [place] images reviewed CLINICAL DATA:  Severe abdominal pain.  Vomiting for 6 days.  EXAM: TRANSABDOMINAL AND TRANSVAGINAL ULTRASOUND OF PELVIS  TECHNIQUE: Both transabdominal and transvaginal ultrasound examinations of the pelvis were performed. Transabdominal technique was performed for global imaging of the pelvis including uterus, ovaries, adnexal regions, and pelvic cul-de-sac. It was necessary to proceed with endovaginal exam following the transabdominal exam to visualize the uterus, endometrium and ovaries to better advantage.  COMPARISON:  CT, 08/03/2017  FINDINGS: Uterus  Measurements: 7.5 x 4.8 x 6.3 cm. There is a mass bulging the left posterior uterine contour consistent with a fibroid. Measures 2.8 x 2.1 x 2.6 cm. No other uterine masses.  Endometrium  Thickness: 7 mm.  No focal abnormality visualized.  Right ovary  Measurements: 2.3 x 1.3 x 2.5 cm. Normal appearance/no adnexal mass.  Left ovary  Measurements: 2.1 x 1.7 x 1.9 cm. Normal appearance/no adnexal mass.  Other findings  No abnormal free fluid.  IMPRESSION: 1. No acute findings. No findings to account for the patient's symptoms. 2. 2.8 cm uterine fibroid.  No other  abnormalities.   Electronically Signed   By: Lajean Manes M.D.   On: 11/06/2017 17:02  Assessment    Pelvic pain GC infection needs treatment Chronic pain issues Small uterine subserosal fibroid    Plan    Rocephin, azithromycin RTC 4 weeks Consider IUD or OCP       Emeterio Reeve 12/20/2017, 3:02 PM

## 2017-12-20 NOTE — Progress Notes (Signed)
Rocephin and zithromax given after discussion with patient, Dr. Roselie Awkward and pharmacy. Patient states her reaction to keflex was that it made her kidneys spasm, denied any rash / itching/ or trouble breathing.

## 2017-12-20 NOTE — Patient Instructions (Signed)
Gonorrhea Gonorrhea is a sexually transmitted disease (STD) that can affect both men and women. If left untreated, this infection can:  Damage the female or female organs.  Cause women and men to be unable to have children (be sterile).  Harm a fetus if an infected woman is pregnant.  It is important to get treatment for gonorrhea as soon as possible. It is also necessary for all of your sexual partners to be tested for the infection. What are the causes? This condition is caused by bacteria called Neisseria gonorrhoeae. The infection is spread from person to person through sexual contact, including oral, anal, and vaginal sex. A newborn can contract the infection from his or her mother during birth. What increases the risk? The following factors may make you more likely to develop this condition:  Being a woman who is younger than 34 years of age and who is sexually active.  Being a woman 65 years of age or older who has: ? A new sex partner. ? More than one sex partner. ? A sex partner who has an STD.  Being a man who has: ? A new sex partner. ? More than one sex partner. ? A sex partner who has an STD.  Using condoms inconsistently.  Currently having, or having previously had, an STD.  Exchanging sex for money or drugs.  What are the signs or symptoms? Some people do not have any symptoms. If you do have symptoms, they may be different for females and males. For females  Pain in the lower abdomen.  Abnormal vaginal discharge. The discharge may be cloudy, thick, or yellow-green in color.  Bleeding between periods.  Painful sex.  Burning or itching in and around the vagina.  Pain or burning when urinating.  Irritation, pain, bleeding, or discharge from the rectum. This may occur if the infection was spread by anal sex.  Sore throat or swollen lymph nodes in the neck. This may occur if the infection was spread by oral sex. For males  Abnormal discharge from the  penis. This discharge may be cloudy, thick, or yellow-green in color.  Pain or burning during urination.  Pain or swelling in the testicles.  Irritation, pain, bleeding, or discharge from the rectum. This may occur if the infection was spread by anal sex.  Sore throat, fever, or swollen lymph nodes in the neck. This may occur if the infection was spread by oral sex. How is this diagnosed? This condition is diagnosed based on:  A physical exam.  A sample of discharge that is examined under a microscope to look for the bacteria. The discharge may be taken from the urethra, cervix, throat, or rectum.  Urine tests.  Not all of test results will be available during your visit. How is this treated? This condition is treated with antibiotic medicines. It is important for treatment to begin as soon as possible. Early treatment may prevent some problems from developing. Do not have sex during treatment. Avoid all types of sexual activity for 7 days after treatment is complete and until any sex partners have been treated. Follow these instructions at home:  Take over-the-counter and prescription medicines only as told by your health care provider.  Take your antibiotic medicine as told by your health care provider. Do not stop taking the antibiotic even if you start to feel better.  Do not have sex until at least 7 days after you and your partner(s) have finished treatment and your health care provider says  it is okay.  It is your responsibility to get your test results. Ask your health care provider, or the department performing the test, when your results will be ready.  If you test positive for gonorrhea, inform your recent sexual partners. This includes any oral, anal, or vaginal sex partners. They need to be checked for gonorrhea even if they do not have symptoms. They may need treatment, even if they test negative for gonorrhea.  Keep all follow-up visits as told by your health care  provider. This is important. How is this prevented?  Use latex condoms correctly every time you have sexual intercourse.  Ask if your sexual partner has been tested for STDs and had negative results.  Avoid having multiple sexual partners. Contact a health care provider if:  You develop a bad reaction to the medicine you were prescribed. This may include: ? A rash. ? Nausea. ? Vomiting. ? Diarrhea.  Your symptoms do not get better after a few days of taking antibiotics.  Your symptoms get worse.  You develop new symptoms.  Your pain gets worse.  You have a fever.  You develop pain, itching, or discharge around the eyes. Get help right away if:  You feel dizzy or faint.  You have trouble breathing or have shortness of breath.  You develop an irregular heartbeat.  You have severe abdominal pain with or without shoulder pain.  You develop any bumps or sores (lesions) on your skin.  You develop warmth, redness, pain, or swelling around your joints, such as the knee. Summary  Gonorrhea is an STDthat can affect both men and women.  This condition is caused by bacteria called Neisseria gonorrhoeae. The infection is spread from person to person, usually through sexual contact, including oral, anal, and vaginal sex.  Symptoms vary between males and females. Generally, they include abnormal discharge and burning during urination. Women may also experience painful sex, itching around the vagina, and bleeding between menstrual periods. Men may also experience swelling of the testicles.  This condition is treated with antibiotic medicines. Do not have sex until at least 7 days after completing antibiotic treatment.  If left untreated, gonorrhea can have serious side effects and complications. This information is not intended to replace advice given to you by your health care provider. Make sure you discuss any questions you have with your health care provider. Document  Released: 11/03/2000 Document Revised: 10/06/2016 Document Reviewed: 10/06/2016 Elsevier Interactive Patient Education  [employer].

## 2017-12-20 NOTE — Progress Notes (Signed)
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

## 2017-12-30 ENCOUNTER — Inpatient Hospital Stay (EMERGENCY_DEPARTMENT_HOSPITAL)
Admission: AD | Admit: 2017-12-30 | Discharge: 2017-12-30 | Discharge status: Against Medical Advice | Payer: Medicaid Other | Source: Ambulatory Visit | Attending: Obstetrics & Gynecology | Admitting: Obstetrics & Gynecology

## 2017-12-30 ENCOUNTER — Encounter (HOSPITAL_COMMUNITY): Payer: Self-pay

## 2017-12-30 DIAGNOSIS — R112 Nausea with vomiting, unspecified: Secondary | ICD-10-CM

## 2017-12-30 DIAGNOSIS — R1084 Generalized abdominal pain: Secondary | ICD-10-CM

## 2017-12-30 HISTORY — DX: Diverticulitis of intestine, part unspecified, without perforation or abscess without bleeding: K57.92

## 2017-12-30 LAB — POCT PREGNANCY, URINE: Preg Test, Ur: NEGATIVE

## 2017-12-30 LAB — URINALYSIS, ROUTINE W REFLEX MICROSCOPIC
Glucose, UA: NEGATIVE mg/dL
KETONES UR: 5 mg/dL — AB
Nitrite: NEGATIVE
PH: 5 (ref 5.0–8.0)
Protein, ur: 100 mg/dL — AB
Specific Gravity, Urine: 1.027 (ref 1.005–1.030)

## 2017-12-30 LAB — RAPID URINE DRUG SCREEN, HOSP PERFORMED
Amphetamines: NOT DETECTED
BARBITURATES: NOT DETECTED
Benzodiazepines: NOT DETECTED
Cocaine: NOT DETECTED
OPIATES: NOT DETECTED
TETRAHYDROCANNABINOL: POSITIVE — AB

## 2017-12-30 NOTE — MAU Provider Note (Signed)
History     CSN: [id number]  Arrival date and time: 12/30/17 01-26-35   First Provider Initiated Contact with Patient 12/30/17 Jan 27, 2104      Chief Complaint  Patient presents with  . Abdominal Pain  . Back Pain   32 y.o. non-pregnant female here with LAP and N/V x2 days. Describes pain as constant, bilateral, upper and lower. States "Hot baths are the only thing that helps". Associated sx are N/V. She cannot tolerate anything po. States she has lost 9 lbs in 2 days. Menses started later the same day as symptoms. She states these sx occur every 15-16 days when she has a period. She was recently treated for [facility]. States "they think I may have bladder and stomach cancer and Leukemia". Pt admits to marijuana use, last used this am. Denies all other drugs.   Past Medical History:  Diagnosis Date  . Anxiety   . Back pain   . Constipation   . Depression   . Diverticulitis 10/31/2017  . GERD (gastroesophageal reflux disease)   . Glaucoma of both eyes   . Hematuria   . History of abnormal cervical Pap smear   . History of chronic gastritis   . History of kidney stones   . Hydronephrosis, right   . Renal calculi    bilateral per ct [id number]  . Right ureteral stone   . Urgency of urination   . Uterine fibroid   . Uterine polyp     Past Surgical History:  Procedure Laterality Date  . CYSTOSCOPY W/ URETERAL STENT PLACEMENT Right 07/04/2016   Procedure: CYSTOSCOPY WITH RIGHT RETROGRADE PYELOGRAM, RIGHT URETERAL STENT PLACEMENT;  Surgeon: Ardis Hughs, MD;  Location: [facility];  Service: Urology;  Laterality: Right;  . CYSTOSCOPY WITH RETROGRADE PYELOGRAM, URETEROSCOPY AND STENT PLACEMENT Left 11/28/2007  . CYSTOSCOPY WITH RETROGRADE PYELOGRAM, URETEROSCOPY AND STENT PLACEMENT Right 08/02/2016   Procedure: CYSTOSCOPY WITH RIGHT  RETROGRADE PYELOGRAM, URETEROSCOPY,  AND STENT EXCHANGE;  Surgeon: Ardis Hughs, MD;  Location: [facility];  Service: Urology;  Laterality:  Right;  . ESOPHAGOGASTRODUODENOSCOPY N/A 05/17/2016   Dr. Oneida Alar: LA Grade A esophagitis, mild gastritis s/p biopsy noting reactive gastritis, medium sized hiatal hernia, next EGD WITH PROPOFOL   . INDUCED ABORTION  01-27-08    Family History  Problem Relation Age of Onset  . Colon cancer Paternal Uncle        Passed away Jan 26, 2015  . Nephrolithiasis Father   . Hypertension Father   . Nephrolithiasis Sister   . Nephrolithiasis Sister   . Nephrolithiasis Sister   . Hypertension Mother     Social History   Tobacco Use  . Smoking status: Current Every Day Smoker    Packs/day: 1.00    Years: 20.00    Pack years: 20.00    Types: Cigarettes  . Smokeless tobacco: Never Used  . Tobacco comment: 5-7 cigarettes daily   Substance Use Topics  . Alcohol use: No    Alcohol/week: 2.4 oz    Types: 4 Shots of liquor per week    Comment: states has not used in 2 years  . Drug use: No    Comment: "Only when I feel nauseated and can't function"    Allergies:  Allergies  Allergen Reactions  . Keflex [Cephalexin] Other (See Comments)    Reaction:  Bladder spasms  . Nsaids     Stomach bleeding  . Codeine Hives and Itching  . Tape Other (See Comments)    Reaction:  Burning   . Vicodin [Hydrocodone-Acetaminophen] Hives and Itching    No medications prior to admission.    Review of Systems  Gastrointestinal: Positive for abdominal pain, nausea and vomiting.   Physical Exam   Blood pressure (!) 121/91, pulse (!) 117, temperature 98.9 F (37.2 C), resp. rate (!) 22, height 5' 2"  (1.575 m), weight 112 lb (50.8 kg), last menstrual period 12/28/2017, SpO2 100 %.  Physical Exam  Constitutional: She is oriented to person, place, and time. She appears well-developed and well-nourished.  HENT:  Head: Normocephalic.  Neck: Normal range of motion.  Respiratory: Effort normal. No respiratory distress.  GI: Soft. She exhibits no distension and no mass. There is no tenderness. There is no rebound  and no guarding.  Musculoskeletal: Normal range of motion.  Neurological: She is alert and oriented to person, place, and time.  Skin: Skin is warm and dry.  Psychiatric: Her mood appears anxious. Her affect is angry.   Results for orders placed or performed during the hospital encounter of 12/30/17 (from the past 24 hour(s))  Urinalysis, Routine w reflex microscopic     Status: Abnormal   Collection Time: 12/30/17  8:46 PM  Result Value Ref Range   Color, Urine AMBER (A) YELLOW   APPearance CLOUDY (A) CLEAR   Specific Gravity, Urine 1.027 1.005 - 1.030   pH 5.0 5.0 - 8.0   Glucose, UA NEGATIVE NEGATIVE mg/dL   Hgb urine dipstick LARGE (A) NEGATIVE   Bilirubin Urine SMALL (A) NEGATIVE   Ketones, ur 5 (A) NEGATIVE mg/dL   Protein, ur 100 (A) NEGATIVE mg/dL   Nitrite NEGATIVE NEGATIVE   Leukocytes, UA MODERATE (A) NEGATIVE   RBC / HPF TOO NUMEROUS TO COUNT 0 - 5 RBC/hpf   WBC, UA TOO NUMEROUS TO COUNT 0 - 5 WBC/hpf   Bacteria, UA MANY (A) NONE SEEN   Squamous Epithelial / LPF TOO NUMEROUS TO COUNT (A) NONE SEEN   WBC Clumps PRESENT    Mucus PRESENT   Urine rapid drug screen (hosp performed)     Status: Abnormal   Collection Time: 12/30/17  8:46 PM  Result Value Ref Range   Opiates NONE DETECTED NONE DETECTED   Cocaine NONE DETECTED NONE DETECTED   Benzodiazepines NONE DETECTED NONE DETECTED   Amphetamines NONE DETECTED NONE DETECTED   Tetrahydrocannabinol POSITIVE (A) NONE DETECTED   Barbiturates NONE DETECTED NONE DETECTED  Pregnancy, urine POC     Status: None   Collection Time: 12/30/17  9:06 PM  Result Value Ref Range   Preg Test, Ur NEGATIVE NEGATIVE   MAU Course  Procedures  MDM Labs ordered. Pt difficult to interview, restless in bed, asking for pain meds, uncooperative. No evidence of pregnancy. Pt was seen leaving room, nurse approached her and she declined signing AMA form. 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   Assessment and  Plan   1. Generalized abdominal pain   2. Nausea and vomiting, intractability of vomiting not specified, unspecified vomiting type    Left AMA  Julianne Handler, CNM 12/30/2017, 10:40 PM

## 2017-12-30 NOTE — MAU Note (Signed)
Pt reports lower abdominal pain and bleeding that started Friday. States this is her period. Pt states she took percocet yesterday, but has not taken anything today. Pt sobbing and inconsolable in triage.

## 2017-12-30 NOTE — MAU Note (Signed)
Patient stormed out of her room stating "I'm leaving."  RN approached patient and asked her to sign AMA form.  Patient refused to sign, stating to call her when we can help her and walked away.

## 2017-12-31 ENCOUNTER — Emergency Department (HOSPITAL_COMMUNITY): Payer: Medicaid Other

## 2017-12-31 ENCOUNTER — Encounter (HOSPITAL_COMMUNITY): Payer: Self-pay | Admitting: Emergency Medicine

## 2017-12-31 ENCOUNTER — Inpatient Hospital Stay (HOSPITAL_COMMUNITY)
Admission: EM | Admit: 2017-12-31 | Discharge: 2018-01-01 | DRG: 683 | Discharge status: Against Medical Advice | Payer: Medicaid Other | Attending: Internal Medicine | Admitting: Internal Medicine

## 2017-12-31 DIAGNOSIS — E860 Dehydration: Secondary | ICD-10-CM | POA: Diagnosis present

## 2017-12-31 DIAGNOSIS — R740 Nonspecific elevation of levels of transaminase and lactic acid dehydrogenase [LDH]: Secondary | ICD-10-CM | POA: Diagnosis present

## 2017-12-31 DIAGNOSIS — R112 Nausea with vomiting, unspecified: Secondary | ICD-10-CM | POA: Diagnosis present

## 2017-12-31 DIAGNOSIS — K219 Gastro-esophageal reflux disease without esophagitis: Secondary | ICD-10-CM | POA: Diagnosis present

## 2017-12-31 DIAGNOSIS — Z87442 Personal history of urinary calculi: Secondary | ICD-10-CM

## 2017-12-31 DIAGNOSIS — R6510 Systemic inflammatory response syndrome (SIRS) of non-infectious origin without acute organ dysfunction: Secondary | ICD-10-CM | POA: Diagnosis present

## 2017-12-31 DIAGNOSIS — Z881 Allergy status to other antibiotic agents status: Secondary | ICD-10-CM | POA: Diagnosis not present

## 2017-12-31 DIAGNOSIS — F1290 Cannabis use, unspecified, uncomplicated: Secondary | ICD-10-CM | POA: Diagnosis present

## 2017-12-31 DIAGNOSIS — K920 Hematemesis: Secondary | ICD-10-CM | POA: Diagnosis not present

## 2017-12-31 DIAGNOSIS — Z885 Allergy status to narcotic agent status: Secondary | ICD-10-CM | POA: Diagnosis not present

## 2017-12-31 DIAGNOSIS — G894 Chronic pain syndrome: Secondary | ICD-10-CM | POA: Diagnosis present

## 2017-12-31 DIAGNOSIS — Z886 Allergy status to analgesic agent status: Secondary | ICD-10-CM

## 2017-12-31 DIAGNOSIS — K529 Noninfective gastroenteritis and colitis, unspecified: Secondary | ICD-10-CM | POA: Diagnosis not present

## 2017-12-31 DIAGNOSIS — Z9109 Other allergy status, other than to drugs and biological substances: Secondary | ICD-10-CM | POA: Diagnosis not present

## 2017-12-31 DIAGNOSIS — N179 Acute kidney failure, unspecified: Principal | ICD-10-CM | POA: Diagnosis present

## 2017-12-31 DIAGNOSIS — Z79899 Other long term (current) drug therapy: Secondary | ICD-10-CM

## 2017-12-31 DIAGNOSIS — E872 Acidosis, unspecified: Secondary | ICD-10-CM | POA: Diagnosis present

## 2017-12-31 DIAGNOSIS — H409 Unspecified glaucoma: Secondary | ICD-10-CM | POA: Diagnosis present

## 2017-12-31 LAB — URINALYSIS, ROUTINE W REFLEX MICROSCOPIC
Bilirubin Urine: NEGATIVE
GLUCOSE, UA: 50 mg/dL — AB
Ketones, ur: 5 mg/dL — AB
NITRITE: NEGATIVE
Protein, ur: 100 mg/dL — AB
SPECIFIC GRAVITY, URINE: 1.023 (ref 1.005–1.030)
pH: 5 (ref 5.0–8.0)

## 2017-12-31 LAB — I-STAT CG4 LACTIC ACID, ED
Lactic Acid, Venous: 3.37 mmol/L (ref 0.5–1.9)
Lactic Acid, Venous: 1.09 mmol/L (ref 0.5–1.9)

## 2017-12-31 LAB — I-STAT BETA HCG BLOOD, ED (MC, WL, AP ONLY): I-stat hCG, quantitative: <5 m[IU]/mL

## 2017-12-31 LAB — CBC WITH DIFFERENTIAL/PLATELET
Basophils Absolute: 0 10*3/uL (ref 0.0–0.1)
Basophils Relative: 0 %
Eosinophils Absolute: 0 10*3/uL (ref 0.0–0.7)
Eosinophils Relative: 0 %
HCT: 51.7 % — ABNORMAL HIGH (ref 36.0–46.0)
Hemoglobin: 18.2 g/dL — ABNORMAL HIGH (ref 12.0–15.0)
Lymphocytes Relative: 11 %
Lymphs Abs: 3.2 10*3/uL (ref 0.7–4.0)
MCH: 30.7 pg (ref 26.0–34.0)
MCHC: 35.2 g/dL (ref 30.0–36.0)
MCV: 87.3 fL (ref 78.0–100.0)
Monocytes Absolute: 2.9 10*3/uL — ABNORMAL HIGH (ref 0.1–1.0)
Monocytes Relative: 10 %
Neutro Abs: 23.2 10*3/uL — ABNORMAL HIGH (ref 1.7–7.7)
Neutrophils Relative %: 79 %
Platelets: 429 10*3/uL — ABNORMAL HIGH (ref 150–400)
RBC: 5.92 MIL/uL — ABNORMAL HIGH (ref 3.87–5.11)
RDW: 14 % (ref 11.5–15.5)
WBC: 29.3 10*3/uL — ABNORMAL HIGH (ref 4.0–10.5)

## 2017-12-31 LAB — COMPREHENSIVE METABOLIC PANEL
ALT: 26 U/L (ref 14–54)
ANION GAP: 33 — AB (ref 5–15)
AST: 45 U/L — ABNORMAL HIGH (ref 15–41)
Albumin: 6.1 g/dL — ABNORMAL HIGH (ref 3.5–5.0)
Alkaline Phosphatase: 86 U/L (ref 38–126)
BUN: 54 mg/dL — AB (ref 6–20)
CO2: 16 mmol/L — ABNORMAL LOW (ref 22–32)
Calcium: 10.9 mg/dL — ABNORMAL HIGH (ref 8.9–10.3)
Chloride: 88 mmol/L — ABNORMAL LOW (ref 101–111)
Creatinine, Ser: 7.05 mg/dL — ABNORMAL HIGH (ref 0.44–1.00)
GFR, EST AFRICAN AMERICAN: 8 mL/min — AB (ref >60)
GFR, EST NON AFRICAN AMERICAN: 7 mL/min — AB (ref >60)
Glucose, Bld: 173 mg/dL — ABNORMAL HIGH (ref 65–99)
POTASSIUM: 3.9 mmol/L (ref 3.5–5.1)
Sodium: 137 mmol/L (ref 135–145)
TOTAL PROTEIN: 10.4 g/dL — AB (ref 6.5–8.1)
Total Bilirubin: 0.7 mg/dL (ref 0.3–1.2)

## 2017-12-31 LAB — LIPASE, BLOOD: Lipase: 23 U/L (ref 11–51)

## 2017-12-31 MED ORDER — STERILE WATER FOR INJECTION IV SOLN
INTRAVENOUS | Status: AC
  Administered 2017-12-31: 21:00:00 via INTRAVENOUS
  Filled 2017-12-31: qty 850

## 2017-12-31 MED ORDER — SODIUM CHLORIDE 0.9 % IV SOLN
INTRAVENOUS | Status: DC
  Administered 2018-01-01: 08:00:00 via INTRAVENOUS

## 2017-12-31 MED ORDER — IOPAMIDOL (ISOVUE-300) INJECTION 61%
30.0000 mL | Freq: Once | INTRAVENOUS | Status: AC | PRN
  Administered 2017-12-31: 30 mL via ORAL

## 2017-12-31 MED ORDER — PROMETHAZINE HCL 25 MG/ML IJ SOLN
12.5000 mg | Freq: Four times a day (QID) | INTRAMUSCULAR | Status: DC | PRN
  Administered 2018-01-01: 25 mg via INTRAVENOUS
  Filled 2017-12-31 (×2): qty 1

## 2017-12-31 MED ORDER — CAPSAICIN 0.025 % EX CREA
TOPICAL_CREAM | Freq: Once | CUTANEOUS | Status: AC
  Administered 2017-12-31: 18:00:00 via TOPICAL
  Filled 2017-12-31: qty 60

## 2017-12-31 MED ORDER — METHOCARBAMOL 1000 MG/10ML IJ SOLN
500.0000 mg | Freq: Once | INTRAVENOUS | Status: AC
  Administered 2017-12-31: 500 mg via INTRAVENOUS
  Filled 2017-12-31: qty 550

## 2017-12-31 MED ORDER — PANTOPRAZOLE SODIUM 40 MG PO TBEC
40.0000 mg | DELAYED_RELEASE_TABLET | Freq: Two times a day (BID) | ORAL | Status: DC
  Administered 2017-12-31: 40 mg via ORAL
  Filled 2017-12-31 (×2): qty 1

## 2017-12-31 MED ORDER — HEPARIN SODIUM (PORCINE) 5000 UNIT/ML IJ SOLN
5000.0000 [IU] | Freq: Three times a day (TID) | INTRAMUSCULAR | Status: DC
  Administered 2018-01-01: 5000 [IU] via SUBCUTANEOUS
  Filled 2017-12-31 (×5): qty 1

## 2017-12-31 MED ORDER — SODIUM CHLORIDE 0.9 % IV BOLUS (SEPSIS)
1000.0000 mL | Freq: Once | INTRAVENOUS | Status: AC
  Administered 2017-12-31: 1000 mL via INTRAVENOUS

## 2017-12-31 MED ORDER — METHOCARBAMOL 1000 MG/10ML IJ SOLN: 500.0000 mg | Freq: Once | INTRAMUSCULAR | Status: DC

## 2017-12-31 MED ORDER — DIPHENHYDRAMINE HCL 50 MG/ML IJ SOLN
25.0000 mg | Freq: Once | INTRAMUSCULAR | Status: AC
  Administered 2017-12-31: 25 mg via INTRAVENOUS
  Filled 2017-12-31: qty 1

## 2017-12-31 MED ORDER — HALOPERIDOL LACTATE 5 MG/ML IJ SOLN
2.5000 mg | Freq: Once | INTRAMUSCULAR | Status: AC
  Administered 2017-12-31: 2.5 mg via INTRAVENOUS
  Filled 2017-12-31: qty 1

## 2017-12-31 MED ORDER — PROCHLORPERAZINE EDISYLATE 5 MG/ML IJ SOLN
10.0000 mg | Freq: Once | INTRAMUSCULAR | Status: AC
  Administered 2017-12-31: 10 mg via INTRAVENOUS
  Filled 2017-12-31: qty 2

## 2017-12-31 MED ORDER — SODIUM CHLORIDE 0.9 % IV BOLUS (SEPSIS)
500.0000 mL | Freq: Once | INTRAVENOUS | Status: AC
  Administered 2017-12-31: 500 mL via INTRAVENOUS

## 2017-12-31 MED ORDER — ALBUTEROL SULFATE (2.5 MG/3ML) 0.083% IN NEBU: 2.5000 mg | INHALATION_SOLUTION | Freq: Four times a day (QID) | RESPIRATORY_TRACT | Status: DC | PRN

## 2017-12-31 MED ORDER — VANCOMYCIN HCL IN DEXTROSE 1-5 GM/200ML-% IV SOLN
1000.0000 mg | Freq: Once | INTRAVENOUS | Status: AC
  Administered 2017-12-31: 1000 mg via INTRAVENOUS
  Filled 2017-12-31: qty 200

## 2017-12-31 MED ORDER — CAPSAICIN 0.075 % EX CREA
TOPICAL_CREAM | Freq: Once | CUTANEOUS | Status: DC
  Filled 2017-12-31: qty 60

## 2017-12-31 MED ORDER — PROMETHAZINE HCL 25 MG PO TABS
25.0000 mg | ORAL_TABLET | Freq: Four times a day (QID) | ORAL | Status: DC | PRN
  Filled 2017-12-31: qty 1

## 2017-12-31 MED ORDER — PIPERACILLIN-TAZOBACTAM 3.375 G IVPB 30 MIN
3.3750 g | Freq: Once | INTRAVENOUS | Status: AC
  Administered 2017-12-31: 3.375 g via INTRAVENOUS
  Filled 2017-12-31: qty 50

## 2017-12-31 MED ORDER — IOPAMIDOL (ISOVUE-300) INJECTION 61%
INTRAVENOUS | Status: AC
  Administered 2017-12-31: 30 mL via ORAL
  Filled 2017-12-31: qty 30

## 2017-12-31 NOTE — ED Notes (Signed)
Bed: WA08 Expected date:  Expected time:  Means of arrival:  Comments: ems

## 2017-12-31 NOTE — ED Provider Notes (Signed)
[facility] DEPT Provider Note   CSN: [id number] Arrival date & time: 12/31/17  1648     History   Chief Complaint Chief Complaint  Patient presents with  . Emesis    HPI Jeanne Mcneil is a 32 y.o. female.  HPI   32 year old female with extensive past medical history as below including cyclical vomiting here with severe nausea, vomiting, and cramping.  Patient is in obvious distress, screaming on arrival.  She reports that her symptoms started Friday.  She did smoke marijuana at the time.  She then developed severe nausea, vomiting, and diffuse abdominal cramping and pain.  She has had associated severe nausea and has been unable to eat or drink.  She has not had any diarrhea.  She has had no fevers or chills.  She describes the pain is a cramping sensation diffusely.  This seems to come and go.  She is been unable to eat and drink.  She is also had associated subjective chills but no known fevers.  No urinary symptoms.  Pain is worse with any attempted eating.  Denies any alleviating factors.  Past Medical History:  Diagnosis Date  . Anxiety   . Back pain   . Constipation   . Depression   . Diverticulitis 10/31/2017  . GERD (gastroesophageal reflux disease)   . Glaucoma of both eyes   . Hematuria   . History of abnormal cervical Pap smear   . History of chronic gastritis   . History of kidney stones   . Hydronephrosis, right   . Renal calculi    bilateral per ct [id number]  . Right ureteral stone   . Urgency of urination   . Uterine fibroid   . Uterine polyp     Patient Active Problem List   Diagnosis Date Noted  . Gonorrhea 12/20/2017  . AKI (acute kidney injury) ([place]) 06/13/2017  . Hematuria 06/13/2017  . Hypomagnesemia 04/29/2017  . Cannabinoid hyperemesis syndrome ([place]) 04/29/2017  . Acute colitis 04/29/2017  . Abdominal pain   . Normochromic normocytic anemia   . Nausea & vomiting 04/02/2017  . Acute renal failure ([place])  01/16/2017  . Hyponatremia 01/16/2017  . Leukocytosis 01/16/2017  . Nausea and vomiting 01/15/2017  . Chest pain 01/15/2017  . Pyelonephritis 10/11/2016  . Hypokalemia 10/11/2016  . Hyperglycemia 10/11/2016  . Elevated blood pressure reading 10/11/2016  . Left nephrolithiasis 10/11/2016  . Renal colic on left side 50/38/8828  . Generalized anxiety disorder 10/11/2016  . Nausea with vomiting 07/26/2016  . Ureteral stone 07/04/2016  . UTI (urinary tract infection) 07/04/2016  . Hydronephrosis of right kidney 07/04/2016  . GERD (gastroesophageal reflux disease) 07/04/2016  . Ureteral calculi 07/04/2016  . Abdominal pain, chronic, epigastric   . Hematemesis 05/16/2016  . Heme + stool 05/16/2016  . Abdominal pain, epigastric 05/16/2016    Past Surgical History:  Procedure Laterality Date  . CYSTOSCOPY W/ URETERAL STENT PLACEMENT Right 07/04/2016   Procedure: CYSTOSCOPY WITH RIGHT RETROGRADE PYELOGRAM, RIGHT URETERAL STENT PLACEMENT;  Surgeon: Ardis Hughs, MD;  Location: [facility];  Service: Urology;  Laterality: Right;  . CYSTOSCOPY WITH RETROGRADE PYELOGRAM, URETEROSCOPY AND STENT PLACEMENT Left 11/28/2007  . CYSTOSCOPY WITH RETROGRADE PYELOGRAM, URETEROSCOPY AND STENT PLACEMENT Right 08/02/2016   Procedure: CYSTOSCOPY WITH RIGHT  RETROGRADE PYELOGRAM, URETEROSCOPY,  AND STENT EXCHANGE;  Surgeon: Ardis Hughs, MD;  Location: [facility];  Service: Urology;  Laterality: Right;  . ESOPHAGOGASTRODUODENOSCOPY N/A 05/17/2016   Dr. Oneida Alar: LA  Grade A esophagitis, mild gastritis s/p biopsy noting reactive gastritis, medium sized hiatal hernia, next EGD WITH PROPOFOL   . INDUCED ABORTION  01-31-08    OB History    Gravida Para Term Preterm AB Living   3 2 2   1 2    SAB TAB Ectopic Multiple Live Births   1       2       Home Medications    Prior to Admission medications   Medication Sig Start Date End Date Taking? Authorizing Provider  oxyCODONE-acetaminophen  (PERCOCET/ROXICET) 5-325 MG tablet Take 1 tablet by mouth daily as needed for severe pain.   Yes [provider]  pantoprazole (PROTONIX) 40 MG tablet Take 1 tablet (40 mg total) by mouth 2 (two) times daily. 08/06/17  Yes Robyn Haber, MD  promethazine (PHENERGAN) 25 MG tablet Take 1 tablet (25 mg total) by mouth every 6 (six) hours as needed for nausea or vomiting. 07/10/17  Yes Pollina, Gwenyth Allegra, MD  metoCLOPramide (REGLAN) 10 MG tablet Take 1 tablet (10 mg total) by mouth every 6 (six) hours as needed for nausea (nausea/headache). Patient not taking: Reported on 12/31/2017 08/09/17   Jola Schmidt, MD    Family History Family History  Problem Relation Age of Onset  . Colon cancer Paternal Uncle        Passed away 2015-01-30  . Nephrolithiasis Father   . Hypertension Father   . Nephrolithiasis Sister   . Nephrolithiasis Sister   . Nephrolithiasis Sister   . Hypertension Mother     Social History Social History   Tobacco Use  . Smoking status: Current Every Day Smoker    Packs/day: 1.00    Years: 20.00    Pack years: 20.00    Types: Cigarettes  . Smokeless tobacco: Never Used  . Tobacco comment: 5-7 cigarettes daily   Substance Use Topics  . Alcohol use: No    Alcohol/week: 2.4 oz    Types: 4 Shots of liquor per week    Comment: states has not used in 2 years  . Drug use: No    Comment: "Only when I feel nauseated and can't function"     Allergies   Keflex [cephalexin]; Nsaids; Codeine; Tape; and Vicodin [hydrocodone-acetaminophen]   Review of Systems Review of Systems  Constitutional: Positive for fatigue.  Gastrointestinal: Positive for abdominal pain, nausea and vomiting.  Musculoskeletal: Positive for arthralgias and myalgias.  All other systems reviewed and are negative.    Physical Exam Updated Vital Signs BP 125/69   Pulse 81   Resp 20   Ht 5' 2"  (1.575 m)   Wt 50.8 kg (112 lb)   LMP 12/28/2017   SpO2 97%   BMI 20.49 kg/m   Physical  Exam  Constitutional: She is oriented to person, place, and time. She appears well-developed and well-nourished. She appears distressed.  Moaning, screaming in pain  HENT:  Head: Normocephalic and atraumatic.  Eyes: Conjunctivae are normal.  Neck: Neck supple.  Cardiovascular: Normal rate, regular rhythm and normal heart sounds. Exam reveals no friction rub.  No murmur heard. Pulmonary/Chest: Effort normal and breath sounds normal. No respiratory distress. She has no wheezes. She has no rales.  Abdominal: Soft. Normal appearance. She exhibits no distension. There is generalized tenderness. There is no rigidity, no rebound and no guarding.  Musculoskeletal: She exhibits no edema.  Neurological: She is alert and oriented to person, place, and time. She exhibits normal muscle tone.  Skin: Skin is warm.  Capillary refill takes less than 2 seconds.  Psychiatric: She has a normal mood and affect.  Nursing note and vitals reviewed.    ED Treatments / Results  Labs (all labs ordered are listed, but only abnormal results are displayed) Labs Reviewed  CBC WITH DIFFERENTIAL/PLATELET - Abnormal; Notable for the following components:      Result Value   WBC 29.3 (*)    RBC 5.92 (*)    Hemoglobin 18.2 (*)    HCT 51.7 (*)    Platelets 429 (*)    Neutro Abs 23.2 (*)    Monocytes Absolute 2.9 (*)    All other components within normal limits  COMPREHENSIVE METABOLIC PANEL - Abnormal; Notable for the following components:   Chloride 88 (*)    CO2 16 (*)    Glucose, Bld 173 (*)    BUN 54 (*)    Creatinine, Ser 7.05 (*)    Calcium 10.9 (*)    Total Protein 10.4 (*)    Albumin 6.1 (*)    AST 45 (*)    GFR calc non Af Amer 7 (*)    GFR calc Af Amer 8 (*)    Anion gap 33 (*)    All other components within normal limits  URINALYSIS, ROUTINE W REFLEX MICROSCOPIC - Abnormal; Notable for the following components:   APPearance CLOUDY (*)    Glucose, UA 50 (*)    Hgb urine dipstick LARGE (*)     Ketones, ur 5 (*)    Protein, ur 100 (*)    Leukocytes, UA TRACE (*)    Bacteria, UA RARE (*)    Squamous Epithelial / LPF 0-5 (*)    All other components within normal limits  I-STAT CG4 LACTIC ACID, ED - Abnormal; Notable for the following components:   Lactic Acid, Venous 3.37 (*)    All other components within normal limits  CULTURE, BLOOD (ROUTINE X 2)  CULTURE, BLOOD (ROUTINE X 2)  URINE CULTURE  LIPASE, BLOOD  URINALYSIS, ROUTINE W REFLEX MICROSCOPIC  BASIC METABOLIC PANEL  MAGNESIUM  I-STAT BETA HCG BLOOD, ED ([facility], [facility], [facility] ONLY)  I-STAT CG4 LACTIC ACID, ED    EKG  EKG Interpretation  Date/Time:  Monday December 31 2017 17:29:49 EST Ventricular Rate:  125 PR Interval:    QRS Duration: 91 QT Interval:  301 QTC Calculation: 434 R Axis:   86 Text Interpretation:  Sinus arrhythmia Multiple premature complexes, vent & supraven LAE, consider biatrial enlargement Abnormal T, consider ischemia, diffuse leads Baseline wander in lead(s) V3 Since last EKG, rate is increased Otherwise no significant change Confirmed by Duffy Bruce [phone]) on 12/31/2017 5:58:36 PM       Radiology Ct Abdomen Pelvis Wo Contrast  Result Date: 12/31/2017 CLINICAL DATA:  Nausea and vomiting since Friday. EXAM: CT ABDOMEN AND PELVIS WITHOUT CONTRAST TECHNIQUE: Multidetector CT imaging of the abdomen and pelvis was performed following the standard protocol without IV contrast. COMPARISON:  None. FINDINGS: Lower chest: Clear lungs. Small hiatal hernia. Normal size heart without pericardial effusion. Hepatobiliary: The unenhanced liver is unremarkable. No biliary dilatation is noted. The gallbladder is physiologically distended without calculus. Pancreas: Normal Spleen: Normal Adrenals/Urinary Tract: Mild fullness of the left adrenal apex. Right adrenal gland and kidneys demonstrate no acute abnormalities. Punctate calcifications in the lower pole the right kidney possibly vascular in etiology or related to  nonobstructing calculi. Physiologic distention of the urinary bladder without focal mural thickening or calculus. Stomach/Bowel: Contrast distention of the stomach. Normal  small bowel rotation is identified. Mild diffuse jejunal thickening suspicious for small bowel enteritis. No mechanical source of obstruction. Submucosal fat deposition within the cecum and ascending colon which can reflect changes of chronic inflammatory bowel. Vascular/Lymphatic: No significant vascular findings are present. No enlarged abdominal or pelvic lymph nodes. Reproductive: Uterus and bilateral adnexa are unremarkable. Other: Small fat containing umbilical hernia. No abdominopelvic ascites. Musculoskeletal: Mild dextroconvex curvature of the lumbar spine likely positional. Sacroiliac joints are unremarkable without fusion. Mild disc flattening L4-5 and L5-S1. IMPRESSION: 1. Mild diffuse thickening of jejunal loops in the left hemiabdomen suspicious for changes of an enteritis. No mechanical source of obstruction. 2. Punctate calcifications in the right lower pole of the kidney compatible with nonobstructing nephrolithiasis or renal vascular calcifications. 3. Mild disc space narrowing L4-5 and L5-S1. No acute osseous abnormality. Electronically Signed   By: Ashley Royalty M.D.   On: 12/31/2017 20:38   Dg Chest 2 View  Result Date: 12/31/2017 CLINICAL DATA:  Cough. EXAM: CHEST  2 VIEW COMPARISON:  Radiographs of June 13, 2017. FINDINGS: The heart size and mediastinal contours are within normal limits. Both lungs are clear. No pneumothorax or pleural effusion is noted. The visualized skeletal structures are unremarkable. IMPRESSION: No active cardiopulmonary disease. Electronically Signed   By: Marijo Conception, M.D.   On: 12/31/2017 21:09    Procedures .Critical Care Performed by: Duffy Bruce, MD Authorized by: Duffy Bruce, MD   Critical care provider statement:    Critical care time (minutes):  45   Critical care time  was exclusive of:  Separately billable procedures and treating other patients and teaching time   Critical care was necessary to treat or prevent imminent or life-threatening deterioration of the following conditions:  Circulatory failure, metabolic crisis, cardiac failure, dehydration and renal failure   Critical care was time spent personally by me on the following activities:  Development of treatment plan with patient or surrogate, discussions with consultants, evaluation of patient's response to treatment, examination of patient, obtaining history from patient or surrogate, ordering and performing treatments and interventions, ordering and review of laboratory studies, ordering and review of radiographic studies, pulse oximetry, re-evaluation of patient's condition and review of old charts   I assumed direction of critical care for this patient from another provider in my specialty: no     (including critical care time)  Medications Ordered in ED Medications  sodium bicarbonate 150 mEq in sterile water 1,000 mL infusion ( Intravenous New Bag/Given 12/31/17 2048)  sodium chloride 0.9 % bolus 1,000 mL (not administered)  sodium chloride 0.9 % bolus 1,000 mL (0 mLs Intravenous Stopped 12/31/17 1918)  haloperidol lactate (HALDOL) injection 2.5 mg (2.5 mg Intravenous Given 12/31/17 1712)  diphenhydrAMINE (BENADRYL) injection 25 mg (25 mg Intravenous Given 12/31/17 1712)  prochlorperazine (COMPAZINE) injection 10 mg (10 mg Intravenous Given 12/31/17 1744)  diphenhydrAMINE (BENADRYL) injection 25 mg (25 mg Intravenous Given 12/31/17 1745)  capsaicin (ZOSTRIX) 0.025 % cream ( Topical Given 12/31/17 1812)  methocarbamol (ROBAXIN) 500 mg in dextrose 5 % 50 mL IVPB (0 mg Intravenous Stopped 12/31/17 1842)  sodium chloride 0.9 % bolus 1,000 mL (0 mLs Intravenous Stopped 12/31/17 2047)  iopamidol (ISOVUE-300) 61 % injection 30 mL (30 mLs Oral Contrast Given 12/31/17 1835)  piperacillin-tazobactam (ZOSYN) IVPB  3.375 g (0 g Intravenous Stopped 12/31/17 1957)  vancomycin (VANCOCIN) IVPB 1000 mg/200 mL premix (0 mg Intravenous Stopped 12/31/17 2027)  sodium chloride 0.9 % bolus 500 mL (0 mLs Intravenous Stopped 12/31/17  2254)     Initial Impression / Assessment and Plan / ED Course  I have reviewed the triage vital signs and the nursing notes.  Pertinent labs & imaging results that were available during my care of the patient were reviewed by me and considered in my medical decision making (see chart for details).    32 year old female with past medical history as above here with intractable nausea, vomiting, and inability to eat and drink.  Patient tachycardic and markedly dehydrated clinically.  Initial concern for recurrent cyclical vomiting with profound dehydration.  White count 29,000.  She has a history of chronic leukocytosis.  No signs of infection at this time but will follow up remainder of labs and reassess.  IV fluids and antiemetics given.  Lab work shows severe acute on chronic kidney injury with creatinine of 7.  Anion gap elevated at 33.  Concern for acute on chronic renal failure secondary to profound dehydration.  Given her leukocytosis and anion gap, concern for possible underlying lactic acidosis and will cover empirically for sepsis, though more likely related to her chronic nausea and vomiting.  Will also obtain CT scan and plan for admission.  Will consult with nephrology to see if patient is safe for admission to [facility].  Suspect this should improve with fluids and potentially bicarb drip.  Discussed with Dr. Detterding of Nephrology. He is OK w/ admission to [facility], recommends bicarb drip x 1 L and continue fluids. CT scan with possible enteritis, no surgical abnormalties. LA improving. Pt feels better. Admit to medicine.  Final Clinical Impressions(s) / ED Diagnoses   Final diagnoses:  AKI (acute kidney injury) ([place])  Enteritis    ED Discharge Orders    None         Duffy Bruce, MD 12/31/17 2326

## 2017-12-31 NOTE — Progress Notes (Signed)
A consult was received from an ED physician for vancomycin per pharmacy dosing.  The patient's profile has been reviewed for ht/wt/allergies/indication/available labs.   A one time order has been placed for vancomycin 1g.  Further antibiotics/pharmacy consults should be ordered by admitting physician if indicated.                       Thank you, Hershal Coria 12/31/2017  7:06 PM

## 2017-12-31 NOTE — ED Triage Notes (Signed)
Per [facility] coming from home c/o nausea, vomiting, unable to eat since Friday. C/o "charlie horses" all over body. Patient moaning in triage.

## 2017-12-31 NOTE — H&P (Signed)
History and Physical    Jeanne Mcneil JZP:[id number] DOB: 02-26-1986 DOA: 12/31/2017  Referring MD/NP/PA: Dr. Duffy Bruce PCP: Patient, No Pcp Per  Patient coming from: Home  Chief Complaint: Vomiting  I have personally briefly reviewed patient's old medical records in [place]   HPI: Jeanne Mcneil is a 32 y.o. female with medical history significant of nephrolithiasis, chronic pain, and cyclical vomiting; who presents with complaints of nausea and vomiting over the last 2-3 days.  Patient thinks the start of her period likely provoked symptoms.  Reports having severe lower abdominal cramping and pain.  She reports being unable to keep any food or liquids down.  Emesis had been reported to have a turned to a coffee-ground appearance that she thinks may be blood.  Tried utilizing oral Phenergan at home without relief of symptoms.  Last reports smoking marijuana 3 days ago.  Denies having any significant diarrhea, and reports her last bowel movement being 3 days ago.  She is still able to pass flatus.  Associated symptoms included decreased urine output, chills, generalized malaise, weight loss, and whole body muscle cramps.  Denies having any significant fever, chest pain, loss of consciousness, or seizure-like activity.  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  Review of records shows patient with previous history of leiomyoma.  She had been seen in the emergency department yesterday, but left AMA.  ED Course: Upon admission into the emergency department patient was noted to be afebrile, pulse 80-113, blood pressure 122/97- 167/107, and O2 saturations 95-100% on room air.  Labs revealed WBC 29.3, hemoglobin 18.2, platelets 429, BUN 54, creatinine 7.05, calcium 10.9.  Due to the patient's initial presentation sepsis protocol was initiated and the patient was started on empiric  antibiotics of vancomycin and Zosyn and blood cultures were obtained.  She was given 2.5 L of normal saline IV fluids, Phenergan, Benadryl, and Haldol for her symptoms.  Nephrology was consulted,  but recommended giving the patient 1 L of IV fluids with sodium bicarb and to reconsult in a.m. if needed.  Review of Systems  Constitutional: Positive for chills, malaise/fatigue and weight loss. Negative for fever.  HENT: Negative for ear discharge and nosebleeds.   Eyes: Negative for photophobia and pain.  Respiratory: Positive for cough. Negative for shortness of breath.   Cardiovascular: Negative for chest pain and leg swelling.  Gastrointestinal: Positive for abdominal pain, constipation, nausea and vomiting. Negative for diarrhea.  Genitourinary: Negative for dysuria and hematuria.       Positive for decreased urine output  Musculoskeletal: Positive for myalgias. Negative for falls.  Neurological: Positive for weakness. Negative for focal weakness and seizures.  Endo/Heme/Allergies: Negative for polydipsia. Bruises/bleeds easily.  Psychiatric/Behavioral: Positive for substance abuse. Negative for hallucinations.    Past Medical History:  Diagnosis Date  . Anxiety   . Back pain   . Constipation   . Depression   . Diverticulitis 10/31/2017  . GERD (gastroesophageal reflux disease)   . Glaucoma of both eyes   . Hematuria   . History of abnormal cervical Pap smear   . History of chronic gastritis   . History of kidney stones   . Hydronephrosis, right   . Renal calculi    bilateral per ct 0902-2017  . Right ureteral stone   . Urgency of urination   . Uterine fibroid   . Uterine polyp  Past Surgical History:  Procedure Laterality Date  . CYSTOSCOPY W/ URETERAL STENT PLACEMENT Right 07/04/2016   Procedure: CYSTOSCOPY WITH RIGHT RETROGRADE PYELOGRAM, RIGHT URETERAL STENT PLACEMENT;  Surgeon: Ardis Hughs, MD;  Location: [facility];  Service: Urology;  Laterality: Right;  .  CYSTOSCOPY WITH RETROGRADE PYELOGRAM, URETEROSCOPY AND STENT PLACEMENT Left 11/28/2007  . CYSTOSCOPY WITH RETROGRADE PYELOGRAM, URETEROSCOPY AND STENT PLACEMENT Right 08/02/2016   Procedure: CYSTOSCOPY WITH RIGHT  RETROGRADE PYELOGRAM, URETEROSCOPY,  AND STENT EXCHANGE;  Surgeon: Ardis Hughs, MD;  Location: [facility];  Service: Urology;  Laterality: Right;  . ESOPHAGOGASTRODUODENOSCOPY N/A 05/17/2016   Dr. Oneida Alar: LA Grade A esophagitis, mild gastritis s/p biopsy noting reactive gastritis, medium sized hiatal hernia, next EGD WITH PROPOFOL   . INDUCED ABORTION  2008-01-25     reports that she has been smoking cigarettes.  She has a 20.00 pack-year smoking history. she has never used smokeless tobacco. She reports that she does not drink alcohol or use drugs.  Allergies  Allergen Reactions  . Keflex [Cephalexin] Other (See Comments)    Reaction:  Bladder spasms  . Nsaids     Stomach bleeding  . Codeine Hives and Itching  . Tape Other (See Comments)    Reaction:  Burning   . Vicodin [Hydrocodone-Acetaminophen] Hives and Itching    Family History  Problem Relation Age of Onset  . Colon cancer Paternal Uncle        Passed away 2015/01/24  . Nephrolithiasis Father   . Hypertension Father   . Nephrolithiasis Sister   . Nephrolithiasis Sister   . Nephrolithiasis Sister   . Hypertension Mother     Prior to Admission medications   Medication Sig Start Date End Date Taking? Authorizing Provider  oxyCODONE-acetaminophen (PERCOCET/ROXICET) 5-325 MG tablet Take 1 tablet by mouth daily as needed for severe pain.   Yes [provider]  pantoprazole (PROTONIX) 40 MG tablet Take 1 tablet (40 mg total) by mouth 2 (two) times daily. 08/06/17  Yes Robyn Haber, MD  promethazine (PHENERGAN) 25 MG tablet Take 1 tablet (25 mg total) by mouth every 6 (six) hours as needed for nausea or vomiting. 07/10/17  Yes Pollina, Gwenyth Allegra, MD  metoCLOPramide (REGLAN) 10 MG tablet Take 1  tablet (10 mg total) by mouth every 6 (six) hours as needed for nausea (nausea/headache). Patient not taking: Reported on 12/31/2017 08/09/17   Jola Schmidt, MD    Physical Exam:  Constitutional: Young female who appears to be in some discomfort Vitals:   12/31/17 01/24/2009 12/31/17 01/24/2043 12/31/17 01/25/99 12/31/17 2129-01-24  BP:  (!) 167/107 (!) 153/107 (!) 122/97  Pulse:  80 96 88  Resp:  (!) 25 20 (!) 23  SpO2:  95% 100% 100%  Weight: 50.8 kg (112 lb)     Height: _0  (1.575 m)      Eyes: PERRL, lids and conjunctivae normal ENMT: Mucous membranes are dry. Posterior pharynx clear of any exudate or lesions.  Neck: normal, supple, soft mobile 2-3 cm mass of posterior neck, no thyromegaly Respiratory: clear to auscultation bilaterally, no wheezing, no crackles. Normal respiratory effort. No accessory muscle use.  Cardiovascular: Regular rate and rhythm, no murmurs / rubs / gallops. No extremity edema. 2+ pedal pulses. No carotid bruits.  Abdomen: Lower abdominal tenderness to palpation noted.  Bowel sounds present Musculoskeletal: no clubbing / cyanosis. No joint deformity upper and lower extremities. Good ROM, no contractures. Normal muscle tone.  Skin: no rashes, lesions, ulcers. No induration  Neurologic: CN 2-12 grossly intact. Sensation intact, DTR normal. Strength 5/5 in all 4.  Psychiatric: Normal judgment and insight. Alert and oriented x 3.  Anxious mood.     Labs on Admission: I have personally reviewed following labs and imaging studies  CBC: Recent Labs  Lab 12/31/17 1704  WBC 29.3*  NEUTROABS 23.2*  HGB 18.2*  HCT 51.7*  MCV 87.3  PLT 627*   Basic Metabolic Panel: Recent Labs  Lab 12/31/17 1704  NA 137  K 3.9  CL 88*  CO2 16*  GLUCOSE 173*  BUN 54*  CREATININE 7.05*  CALCIUM 10.9*   GFR: Estimated Creatinine Clearance: 9 mL/min (A) (by C-G formula based on SCr of 7.05 mg/dL (H)). Liver Function Tests: Recent Labs  Lab 12/31/17 1704  AST 45*  ALT 26  ALKPHOS  86  BILITOT 0.7  PROT 10.4*  ALBUMIN 6.1*   Recent Labs  Lab 12/31/17 1704  LIPASE 23   No results for input(s): AMMONIA in the last 168 hours. Coagulation Profile: No results for input(s): INR, PROTIME in the last 168 hours. Cardiac Enzymes: No results for input(s): CKTOTAL, CKMB, CKMBINDEX, TROPONINI in the last 168 hours. BNP (last 3 results) No results for input(s): PROBNP in the last 8760 hours. HbA1C: No results for input(s): HGBA1C in the last 72 hours. CBG: No results for input(s): GLUCAP in the last 168 hours. Lipid Profile: No results for input(s): CHOL, HDL, LDLCALC, TRIG, CHOLHDL, LDLDIRECT in the last 72 hours. Thyroid Function Tests: No results for input(s): TSH, T4TOTAL, FREET4, T3FREE, THYROIDAB in the last 72 hours. Anemia Panel: No results for input(s): VITAMINB12, FOLATE, FERRITIN, TIBC, IRON, RETICCTPCT in the last 72 hours. Urine analysis:    Component Value Date/Time   COLORURINE YELLOW 12/31/2017 2037   APPEARANCEUR CLOUDY (A) 12/31/2017 2037   LABSPEC 1.023 12/31/2017 2037   PHURINE 5.0 12/31/2017 2037   GLUCOSEU 50 (A) 12/31/2017 2037   HGBUR LARGE (A) 12/31/2017 2037   BILIRUBINUR NEGATIVE 12/31/2017 2037   KETONESUR 5 (A) 12/31/2017 2037   PROTEINUR 100 (A) 12/31/2017 2037   UROBILINOGEN 1.0 08/06/2017 1348   NITRITE NEGATIVE 12/31/2017 2037   LEUKOCYTESUR TRACE (A) 12/31/2017 2037   Sepsis Labs: No results found for this or any previous visit (from the past 240 hour(s)).   Radiological Exams on Admission: Ct Abdomen Pelvis Wo Contrast  Result Date: 12/31/2017 CLINICAL DATA:  Nausea and vomiting since Friday. EXAM: CT ABDOMEN AND PELVIS WITHOUT CONTRAST TECHNIQUE: Multidetector CT imaging of the abdomen and pelvis was performed following the standard protocol without IV contrast. COMPARISON:  None. FINDINGS: Lower chest: Clear lungs. Small hiatal hernia. Normal size heart without pericardial effusion. Hepatobiliary: The unenhanced liver is  unremarkable. No biliary dilatation is noted. The gallbladder is physiologically distended without calculus. Pancreas: Normal Spleen: Normal Adrenals/Urinary Tract: Mild fullness of the left adrenal apex. Right adrenal gland and kidneys demonstrate no acute abnormalities. Punctate calcifications in the lower pole the right kidney possibly vascular in etiology or related to nonobstructing calculi. Physiologic distention of the urinary bladder without focal mural thickening or calculus. Stomach/Bowel: Contrast distention of the stomach. Normal small bowel rotation is identified. Mild diffuse jejunal thickening suspicious for small bowel enteritis. No mechanical source of obstruction. Submucosal fat deposition within the cecum and ascending colon which can reflect changes of chronic inflammatory bowel. Vascular/Lymphatic: No significant vascular findings are present. No enlarged abdominal or pelvic lymph nodes. Reproductive: Uterus and bilateral adnexa are unremarkable. Other: Small fat containing umbilical hernia.  No abdominopelvic ascites. Musculoskeletal: Mild dextroconvex curvature of the lumbar spine likely positional. Sacroiliac joints are unremarkable without fusion. Mild disc flattening L4-5 and L5-S1. IMPRESSION: 1. Mild diffuse thickening of jejunal loops in the left hemiabdomen suspicious for changes of an enteritis. No mechanical source of obstruction. 2. Punctate calcifications in the right lower pole of the kidney compatible with nonobstructing nephrolithiasis or renal vascular calcifications. 3. Mild disc space narrowing L4-5 and L5-S1. No acute osseous abnormality. Electronically Signed   By: Ashley Royalty M.D.   On: 12/31/2017 20:38   Dg Chest 2 View  Result Date: 12/31/2017 CLINICAL DATA:  Cough. EXAM: CHEST  2 VIEW COMPARISON:  Radiographs of June 13, 2017. FINDINGS: The heart size and mediastinal contours are within normal limits. Both lungs are clear. No pneumothorax or pleural effusion is noted.  The visualized skeletal structures are unremarkable. IMPRESSION: No active cardiopulmonary disease. Electronically Signed   By: Marijo Conception, M.D.   On: 12/31/2017 21:09    EKG: Independently reviewed.  Sinus tachycardia at 125 bpm with PVC  Assessment/Plan Acute renal failure 2/2 dehydration: Acute. Patient's baseline creatinine previously noted to be 2.27 on 12/18, but acutely 7.05 with a BUN of 75 on admission.  Nephrolithiasis without signs of obstruction seen on CT scan of the abdomen and pelvis.  Suspect prerenal cause given significant nausea and vomiting symptoms.  Nephrology was called, but recommended consulting in a.m. if patient not improving with aggressive fluid resuscitation.  Given the consultation of symptoms including acute renal failure, hypercalcemia, and elevated protein also possibly gives concern for multiple myeloma.  - Admit to a MedSurg bed - Strict intake and output - Check FeNa(checking urine sodium and urine creatinine) - Normal saline IV fluids at 100 mL/h - Continue to monitor kidney function  Nausea and vomiting:  Thought previously to be secondary to cannabinoid hyperemesis syndrome.  UDS was positive for cannabis. Question if related to menstrual cycle vs. cannabis use vs. opioid withdrawal.  - Phenergan IV as needed - Advance diet as tolerated - Correct electrolyte abnormalities as needed  Lactic acidosis, metabolic acidosis with anion gap : Acute.  Initial lactic acid elevated at 3.37 on admission with anion gap of 33.  Nephrology recommended giving patient a liter sodium bicarb. - Continue to trend lactic acid levels, and monitor anion gap with BMPs  SIRS/sepsis secondary to enteritis: Acute.  Due to patient's initial presentation with tachycardia, elevated white count, and lactic acid level sepsis protocol was initiated.  Complaining of abdominal pain.  CT shows possible signs of enteritis.  Blood cultures were obtained and the patient was started on  empiric antibiotics of vancomycin and Zosyn.  - Follow-up blood and urine cultures cultures - Trend lactic acid level - De-escalate antibiotics of vancomycin and Zosyn when medically appropriate  Possible hematemesis: Acute.  Patient reports vomiting coffee-ground emesis.  Vital signs currently stable.  Question of secondary to gastritis. - Type and screen for possible need of blood products - Continue to monitor hemoglobin  - Protonix 40 mg twice daily - Check gastric occult if symptoms persist - Consider need of GI consultation  Hypercalcemia: Acute.  Initial calcium noted to be 10.9 on admission.  Patient given aggressive IV fluids.  Patient gives report of possible malignancy. - Check ionized calcium  Transaminitis: AST noted to be elevated at 45 on admission.  He reports no history of alcohol abuse. - May want to recheck CMP  Chronic pain history - Continue home pain medications  DVT prophylaxis:heparin Code Status: Full  Family Communication: No family present at bedside Disposition Plan: Discharge home once medically stable in 2-3 days Consults called: none Admission status: inpatient   Norval Morton MD [facility] Pager [phone]   If 7PM-7AM, please contact night-coverage [email or web address] Password [facility]  12/31/2017, 10:06 PM

## 2017-12-31 NOTE — ED Notes (Addendum)
Patient walked to nurses station with out problem demanding to be seen. This RN explained to patient that someone would be in her room momentarily and thanked her for her patience.

## 2018-01-01 ENCOUNTER — Other Ambulatory Visit: Payer: Self-pay

## 2018-01-01 DIAGNOSIS — E860 Dehydration: Secondary | ICD-10-CM | POA: Diagnosis present

## 2018-01-01 DIAGNOSIS — E872 Acidosis, unspecified: Secondary | ICD-10-CM | POA: Diagnosis present

## 2018-01-01 DIAGNOSIS — K529 Noninfective gastroenteritis and colitis, unspecified: Secondary | ICD-10-CM

## 2018-01-01 DIAGNOSIS — R6510 Systemic inflammatory response syndrome (SIRS) of non-infectious origin without acute organ dysfunction: Secondary | ICD-10-CM | POA: Diagnosis present

## 2018-01-01 DIAGNOSIS — N179 Acute kidney failure, unspecified: Principal | ICD-10-CM

## 2018-01-01 LAB — TYPE AND SCREEN
ABO/RH(D): A POS
ANTIBODY SCREEN: NEGATIVE

## 2018-01-01 LAB — BASIC METABOLIC PANEL
Anion gap: 15 (ref 5–15)
Anion gap: 11 (ref 5–15)
BUN: 49 mg/dL — AB (ref 6–20)
BUN: 37 mg/dL — ABNORMAL HIGH (ref 6–20)
CALCIUM: 7.9 mg/dL — AB (ref 8.9–10.3)
CALCIUM: 7.4 mg/dL — AB (ref 8.9–10.3)
CO2: 22 mmol/L (ref 22–32)
CO2: 24 mmol/L (ref 22–32)
CREATININE: 4.3 mg/dL — AB (ref 0.44–1.00)
CREATININE: 2.35 mg/dL — AB (ref 0.44–1.00)
Chloride: 98 mmol/L — ABNORMAL LOW (ref 101–111)
Chloride: 101 mmol/L (ref 101–111)
GFR calc Af Amer: 30 mL/min — ABNORMAL LOW (ref >60)
GFR calc non Af Amer: 13 mL/min — ABNORMAL LOW (ref >60)
GFR calc non Af Amer: 26 mL/min — ABNORMAL LOW (ref >60)
GFR, EST AFRICAN AMERICAN: 15 mL/min — AB (ref >60)
Glucose, Bld: 117 mg/dL — ABNORMAL HIGH (ref 65–99)
Glucose, Bld: 123 mg/dL — ABNORMAL HIGH (ref 65–99)
Potassium: 2.9 mmol/L — ABNORMAL LOW (ref 3.5–5.1)
Potassium: 3.1 mmol/L — ABNORMAL LOW (ref 3.5–5.1)
SODIUM: 135 mmol/L (ref 135–145)
Sodium: 136 mmol/L (ref 135–145)

## 2018-01-01 LAB — CBC
HCT: 29 % — ABNORMAL LOW (ref 36.0–46.0)
Hemoglobin: 10.2 g/dL — ABNORMAL LOW (ref 12.0–15.0)
MCH: 30.5 pg (ref 26.0–34.0)
MCHC: 35.2 g/dL (ref 30.0–36.0)
MCV: 86.8 fL (ref 78.0–100.0)
PLATELETS: 202 10*3/uL (ref 150–400)
RBC: 3.34 MIL/uL — ABNORMAL LOW (ref 3.87–5.11)
RDW: 14.1 % (ref 11.5–15.5)
WBC: 16.3 10*3/uL — ABNORMAL HIGH (ref 4.0–10.5)

## 2018-01-01 LAB — SODIUM, URINE, RANDOM: Sodium, Ur: 66 mmol/L

## 2018-01-01 LAB — CREATININE, URINE, RANDOM: CREATININE, URINE: 232.38 mg/dL

## 2018-01-01 LAB — CK: Total CK: 297 U/L — ABNORMAL HIGH (ref 38–234)

## 2018-01-01 LAB — MAGNESIUM: MAGNESIUM: 1.5 mg/dL — AB (ref 1.7–2.4)

## 2018-01-01 LAB — ABO/RH: ABO/RH(D): A POS

## 2018-01-01 MED ORDER — PIPERACILLIN-TAZOBACTAM 3.375 G IVPB
3.3750 g | Freq: Three times a day (TID) | INTRAVENOUS | Status: DC
  Administered 2018-01-01: 3.375 g via INTRAVENOUS
  Filled 2018-01-01 (×3): qty 50

## 2018-01-01 MED ORDER — MAGNESIUM SULFATE 2 GM/50ML IV SOLN
2.0000 g | Freq: Once | INTRAVENOUS | Status: AC
  Administered 2018-01-01: 2 g via INTRAVENOUS
  Filled 2018-01-01: qty 50

## 2018-01-01 MED ORDER — SODIUM CHLORIDE 0.9 % IV SOLN
1.0000 g | Freq: Once | INTRAVENOUS | Status: AC
  Administered 2018-01-01: 1 g via INTRAVENOUS
  Filled 2018-01-01: qty 10

## 2018-01-01 MED ORDER — POTASSIUM CHLORIDE 10 MEQ/100ML IV SOLN
10.0000 meq | INTRAVENOUS | Status: AC
  Administered 2018-01-01 (×2): 10 meq via INTRAVENOUS
  Filled 2018-01-01 (×3): qty 100

## 2018-01-01 MED ORDER — OXYCODONE-ACETAMINOPHEN 5-325 MG PO TABS
1.0000 | ORAL_TABLET | Freq: Every day | ORAL | Status: DC | PRN
  Administered 2018-01-01: 1 via ORAL
  Filled 2018-01-01: qty 1

## 2018-01-01 NOTE — Progress Notes (Signed)
PROGRESS NOTE    ANNALYSE LANGLAIS  VXB:[id number] DOB: [birth year] DOA: 12/31/2017 PCP: Patient, No Pcp Per   Brief Narrative: Patient is a 32 year old female with past medical history of nephrolithiasis, chronic pain syndrome, cyclic vomiting, marijuana abuse who presented to the emergency department with complaints of nausea and vomiting over the last 2-3 days.  Patient was also having lower abdominal cramping and pain.  She was found to have severe acute kidney injury secondary to dehydration, possible duodenitis and also severe leukocytosis on presentation and was admitted for further management.  Assessment & Plan:   Principal Problem:   ARF (acute renal failure) (HCC) Active Problems:   Hematemesis   Nausea and vomiting   Dehydration   Lactic acidosis   SIRS (systemic inflammatory response syndrome) (HCC)   Hypercalcemia   Acute renal failure: Prerenal.  Secondary to dehydration.  Kidney function has significantly improved.  Status post bicarb drip. She has history of nephrolithiasis but no signs of obstruction was seen in CT abdomen/pelvis. Continue IV fluids.  Will anticipate further improvement in kidney function tomorrow.  Nausea and vomiting: Thought to be secondary to cannabinoid hyperemesis syndrome.  UDS was positive for cannabinoids.  Continue medications for nausea and vomiting. Start on clear liquid diet today.  Lactic acidosis: Resolved with IV fluids.  Suspected SIRS/sepsis: Patient was initially tachycardic.  Also had elevated white cell counts and lactic acidosis.  Was on vancomycin and Zosyn.  Vancomycin has been discontinued.  We will continue Zosyn for now.  We will continue to monitor the trend of white cell counts.  We will follow blood culture and urine culture.  Abdominal pain/entreritis:She was also having some abdominal pain on presentation.  CT showed possible signs of enteritis. She denies any abdominal pain at present  Possible hematemesis ?:   Suspected on admission.  Reported to have coffee-ground emesis.  Vital signs currently stable.  Likely secondary to gastritis/enteritis.  H and H is currently stable.  Continue Protonix.  Will check fecal occult blood in stool.  Hypercalcemia: Resolved  Transaminitis: Mild elevation in AST on presentation.  Anticipate to improve  Chronic pain syndrome: On different pain medicationd at home.  Continue supportive care   DVT prophylaxis: SCD Code Status: Full Family Communication: None present at the bedside Disposition Plan: Home in 1-2 days   Consultants: None  Procedures: None  Antimicrobials: Zosyn since 10/30/18  Subjective: Patient seen and examined the bedside this morning.  Appears much more comfortable today.  Denies any abdominal pain.  Vitals are stable.  She is afebrile. Nausea and vomiting has improved  Objective: Vitals:   01/01/18 1030 01/01/18 1100 01/01/18 1130 01/01/18 1200  BP: (!) 126/93 113/87 108/74   Pulse: 76 70 65   Resp: 18 20 15    SpO2: 100% 100% 98%   Weight:    50.8 kg (112 lb)  Height:    5' 2"  (1.575 m)    Intake/Output Summary (Last 24 hours) at 01/01/2018 1353 Last data filed at 01/01/2018 0645 Gross per 24 hour  Intake 3965 ml  Output -  Net 3965 ml   Filed Weights   12/31/17 2010 01/01/18 1200  Weight: 50.8 kg (112 lb) 50.8 kg (112 lb)    Examination:  General exam: Appears calm and comfortable ,Not in distress,average built HEENT:PERRL,Oral mucosa moist, Ear/Nose normal on gross exam Soft tissue edema/underlying mass on the back of the neck which is chronic Respiratory system: Bilateral equal air entry, normal vesicular breath sounds, no wheezes  or crackles  Cardiovascular system: S1 & S2 heard, RRR. No JVD, murmurs, rubs, gallops or clicks. No pedal edema. Gastrointestinal system: Abdomen is nondistended, soft and nontender. No organomegaly or masses felt. Normal bowel sounds heard. Central nervous system: Alert and oriented. No  focal neurological deficits. Extremities: No edema, no clubbing ,no cyanosis, distal peripheral pulses palpable. Skin: No rashes, lesions or ulcers,no icterus ,no pallor,tattos MSK: Normal muscle bulk,tone ,power Psychiatry: Judgement and insight appear normal. Mood & affect appropriate.     Data Reviewed: I have personally reviewed following labs and imaging studies  CBC: Recent Labs  Lab 12/31/17 1704 01/01/18 0517  WBC 29.3* 16.3*  NEUTROABS 23.2*  --   HGB 18.2* 10.2*  HCT 51.7* 29.0*  MCV 87.3 86.8  PLT 429* 161   Basic Metabolic Panel: Recent Labs  Lab 12/31/17 1704 12/31/17 2323 01/01/18 0517  NA 137 135 136  K 3.9 2.9* 3.1*  CL 88* 98* 101  CO2 16* 22 24  GLUCOSE 173* 117* 123*  BUN 54* 49* 37*  CREATININE 7.05* 4.30* 2.35*  CALCIUM 10.9* 7.9* 7.4*  MG  --  1.5*  --    GFR: Estimated Creatinine Clearance: 26.9 mL/min (A) (by C-G formula based on SCr of 2.35 mg/dL (H)). Liver Function Tests: Recent Labs  Lab 12/31/17 1704  AST 45*  ALT 26  ALKPHOS 86  BILITOT 0.7  PROT 10.4*  ALBUMIN 6.1*   Recent Labs  Lab 12/31/17 1704  LIPASE 23   No results for input(s): AMMONIA in the last 168 hours. Coagulation Profile: No results for input(s): INR, PROTIME in the last 168 hours. Cardiac Enzymes: Recent Labs  Lab 12/31/17 2339  CKTOTAL 297*   BNP (last 3 results) No results for input(s): PROBNP in the last 8760 hours. HbA1C: No results for input(s): HGBA1C in the last 72 hours. CBG: No results for input(s): GLUCAP in the last 168 hours. Lipid Profile: No results for input(s): CHOL, HDL, LDLCALC, TRIG, CHOLHDL, LDLDIRECT in the last 72 hours. Thyroid Function Tests: No results for input(s): TSH, T4TOTAL, FREET4, T3FREE, THYROIDAB in the last 72 hours. Anemia Panel: No results for input(s): VITAMINB12, FOLATE, FERRITIN, TIBC, IRON, RETICCTPCT in the last 72 hours. Sepsis Labs: Recent Labs  Lab 12/31/17 1929 12/31/17 2212  LATICACIDVEN 3.37*  1.09    Recent Results (from the past 240 hour(s))  Blood Culture (routine x 2)     Status: None (Preliminary result)   Collection Time: 12/31/17  7:11 PM  Result Value Ref Range Status   Specimen Description   Final    BLOOD RIGHT ARM Performed at [facility] [address], [place], [place] [address]    Special Requests   Final    BOTTLES DRAWN AEROBIC AND ANAEROBIC Blood Culture adequate volume   Culture   Final    NO GROWTH < 12 HOURS Performed at [facility], [place] [address], [place], [place] [address]    Report Status PENDING  Incomplete  Blood Culture (routine x 2)     Status: None (Preliminary result)   Collection Time: 12/31/17  7:25 PM  Result Value Ref Range Status   Specimen Description   Final    BLOOD RIGHT WRIST Performed at [facility] [address], [place], [place] [address]    Special Requests   Final    IN PEDIATRIC BOTTLE Blood Culture adequate volume Performed at [facility] [address], [place], [place] [address]  Culture   Final    NO GROWTH < 12 HOURS Performed at [facility], [place] [address], [place], [place] [address]    Report Status PENDING  Incomplete         Radiology Studies: Ct Abdomen Pelvis Wo Contrast  Result Date: 12/31/2017 CLINICAL DATA:  Nausea and vomiting since Friday. EXAM: CT ABDOMEN AND PELVIS WITHOUT CONTRAST TECHNIQUE: Multidetector CT imaging of the abdomen and pelvis was performed following the standard protocol without IV contrast. COMPARISON:  None. FINDINGS: Lower chest: Clear lungs. Small hiatal hernia. Normal size heart without pericardial effusion. Hepatobiliary: The unenhanced liver is unremarkable. No biliary dilatation is noted. The gallbladder is physiologically distended without calculus. Pancreas: Normal Spleen: Normal Adrenals/Urinary Tract: Mild fullness of the left adrenal apex. Right adrenal gland and kidneys demonstrate no acute  abnormalities. Punctate calcifications in the lower pole the right kidney possibly vascular in etiology or related to nonobstructing calculi. Physiologic distention of the urinary bladder without focal mural thickening or calculus. Stomach/Bowel: Contrast distention of the stomach. Normal small bowel rotation is identified. Mild diffuse jejunal thickening suspicious for small bowel enteritis. No mechanical source of obstruction. Submucosal fat deposition within the cecum and ascending colon which can reflect changes of chronic inflammatory bowel. Vascular/Lymphatic: No significant vascular findings are present. No enlarged abdominal or pelvic lymph nodes. Reproductive: Uterus and bilateral adnexa are unremarkable. Other: Small fat containing umbilical hernia. No abdominopelvic ascites. Musculoskeletal: Mild dextroconvex curvature of the lumbar spine likely positional. Sacroiliac joints are unremarkable without fusion. Mild disc flattening L4-5 and L5-S1. IMPRESSION: 1. Mild diffuse thickening of jejunal loops in the left hemiabdomen suspicious for changes of an enteritis. No mechanical source of obstruction. 2. Punctate calcifications in the right lower pole of the kidney compatible with nonobstructing nephrolithiasis or renal vascular calcifications. 3. Mild disc space narrowing L4-5 and L5-S1. No acute osseous abnormality. Electronically Signed   By: Ashley Royalty M.D.   On: 12/31/2017 20:38   Dg Chest 2 View  Result Date: 12/31/2017 CLINICAL DATA:  Cough. EXAM: CHEST  2 VIEW COMPARISON:  Radiographs of June 13, 2017. FINDINGS: The heart size and mediastinal contours are within normal limits. Both lungs are clear. No pneumothorax or pleural effusion is noted. The visualized skeletal structures are unremarkable. IMPRESSION: No active cardiopulmonary disease. Electronically Signed   By: Marijo Conception, M.D.   On: 12/31/2017 21:09        Scheduled Meds: . heparin  5,000 Units Subcutaneous Q8H  .  pantoprazole  40 mg Oral BID   Continuous Infusions: . sodium chloride 100 mL/hr at 01/01/18 7564  . piperacillin-tazobactam (ZOSYN)  IV 3.375 g (01/01/18 1147)     LOS: 1 day    Time spent: More than 50% of that time was spent in counseling and/or coordination of care.      Marene Lenz, MD [facility] Pager [phone]  If 7PM-7AM, please contact night-coverage [email or web address] Password [facility] 01/01/2018, 1:53 PM

## 2018-01-01 NOTE — Progress Notes (Signed)
Patient is requesting to leave AMA due to having to pick up her daughter.  MD notified in text page.

## 2018-01-01 NOTE — Discharge Summary (Signed)
Physician Discharge Summary  Jeanne Mcneil HTD:[id number] DOB: [birth year] DOA: 12/31/2017  PCP: Patient, No Pcp Per  Admit date: 12/31/2017 Discharge date: 01/01/2018  Admitted From: Home Disposition:  Signed AMA  Brief/Interim Summary: Patient signed AMA.Please see today's progress note for details.  Discharge Diagnoses:  Principal Problem:   ARF (acute renal failure) (HCC) Active Problems:   Hematemesis   Nausea and vomiting   Dehydration   Lactic acidosis   SIRS (systemic inflammatory response syndrome) ([facility])   Hypercalcemia    Discharge Instructions    Follow-up Information    [facility].   Contact information: [place] [address] [place] .   Specialty:  Internal Medicine Contact information: [facility].   Why:  You may choose any of these offices to get a doctor at without insurance.  Once you make an appointment at 1 of the 3 clinics, you can use this location for PHARMACY needs and to make a [facility] appointment. Contact information: [place] [address] [phone]         Allergies  Allergen Reactions  . Keflex [Cephalexin] Other (See Comments)    Reaction:  Bladder spasms  . Nsaids     Stomach bleeding  . Codeine Hives and Itching  . Tape Other (See Comments)    Reaction:  Burning   . Vicodin [Hydrocodone-Acetaminophen] Hives and Itching    Consultations: None  Procedures/Studies: Ct Abdomen Pelvis Wo Contrast  Result Date: 12/31/2017 CLINICAL DATA:  Nausea and vomiting since Friday. EXAM: CT ABDOMEN AND PELVIS WITHOUT CONTRAST TECHNIQUE: Multidetector CT imaging of the abdomen and pelvis was performed following the standard protocol without IV  contrast. COMPARISON:  None. FINDINGS: Lower chest: Clear lungs. Small hiatal hernia. Normal size heart without pericardial effusion. Hepatobiliary: The unenhanced liver is unremarkable. No biliary dilatation is noted. The gallbladder is physiologically distended without calculus. Pancreas: Normal Spleen: Normal Adrenals/Urinary Tract: Mild fullness of the left adrenal apex. Right adrenal gland and kidneys demonstrate no acute abnormalities. Punctate calcifications in the lower pole the right kidney possibly vascular in etiology or related to nonobstructing calculi. Physiologic distention of the urinary bladder without focal mural thickening or calculus. Stomach/Bowel: Contrast distention of the stomach. Normal small bowel rotation is identified. Mild diffuse jejunal thickening suspicious for small bowel enteritis. No mechanical source of obstruction. Submucosal fat deposition within the cecum and ascending colon which can reflect changes of chronic inflammatory bowel. Vascular/Lymphatic: No significant vascular findings are present. No enlarged abdominal or pelvic lymph nodes. Reproductive: Uterus and bilateral adnexa are unremarkable. Other: Small fat containing umbilical hernia. No abdominopelvic ascites. Musculoskeletal: Mild dextroconvex curvature of the lumbar spine likely positional. Sacroiliac joints are unremarkable without fusion. Mild disc flattening L4-5 and L5-S1. IMPRESSION: 1. Mild diffuse thickening of jejunal loops in the left hemiabdomen suspicious for changes of an enteritis. No mechanical source of obstruction. 2. Punctate calcifications in the right lower pole of the kidney compatible with nonobstructing nephrolithiasis or renal vascular calcifications. 3. Mild disc space narrowing L4-5 and L5-S1. No acute osseous abnormality. Electronically Signed   By: Ashley Royalty M.D.   On: 12/31/2017 20:38   Dg Chest 2 View  Result Date: 12/31/2017 CLINICAL DATA:  Cough. EXAM: CHEST  2 VIEW  COMPARISON:   Radiographs of June 13, 2017. FINDINGS: The heart size and mediastinal contours are within normal limits. Both lungs are clear. No pneumothorax or pleural effusion is noted. The visualized skeletal structures are unremarkable. IMPRESSION: No active cardiopulmonary disease. Electronically Signed   By: Marijo Conception, M.D.   On: 12/31/2017 21:09       Subjective:   Discharge Exam: Vitals:   01/01/18 1100 01/01/18 1130  BP: 113/87 108/74  Pulse: 70 65  Resp: 20 15  SpO2: 100% 98%   Vitals:   01/01/18 1030 01/01/18 1100 01/01/18 1130 01/01/18 1200  BP: (!) 126/93 113/87 108/74   Pulse: 76 70 65   Resp: 18 20 15    SpO2: 100% 100% 98%   Weight:    50.8 kg (112 lb)  Height:    5' 2"  (1.575 m)        The results of significant diagnostics from this hospitalization (including imaging, microbiology, ancillary and laboratory) are listed below for reference.     Microbiology: Recent Results (from the past 240 hour(s))  Blood Culture (routine x 2)     Status: None (Preliminary result)   Collection Time: 12/31/17  7:11 PM  Result Value Ref Range Status   Specimen Description   Final    BLOOD RIGHT ARM Performed at [facility] [address], [place], [place] [address]    Special Requests   Final    BOTTLES DRAWN AEROBIC AND ANAEROBIC Blood Culture adequate volume   Culture   Final    NO GROWTH < 12 HOURS Performed at [facility], [place] [address] [place], [place] [address]    Report Status PENDING  Incomplete  Blood Culture (routine x 2)     Status: None (Preliminary result)   Collection Time: 12/31/17  7:25 PM  Result Value Ref Range Status   Specimen Description   Final    BLOOD RIGHT WRIST Performed at [facility] [address], [place], [place] [address]    Special Requests   Final    IN PEDIATRIC BOTTLE Blood Culture adequate volume Performed at [facility] [address], [place], [place]  [address]    Culture   Final    NO GROWTH < 12 HOURS Performed at [facility] [address], [place], [place] [address]    Report Status PENDING  Incomplete     Labs: BNP (last 3 results) No results for input(s): BNP in the last 8760 hours. Basic Metabolic Panel: Recent Labs  Lab 12/31/17 1704 12/31/17 2323 01/01/18 0517  NA 137 135 136  K 3.9 2.9* 3.1*  CL 88* 98* 101  CO2 16* 22 24  GLUCOSE 173* 117* 123*  BUN 54* 49* 37*  CREATININE 7.05* 4.30* 2.35*  CALCIUM 10.9* 7.9* 7.4*  MG  --  1.5*  --    Liver Function Tests: Recent Labs  Lab 12/31/17 1704  AST 45*  ALT 26  ALKPHOS 86  BILITOT 0.7  PROT 10.4*  ALBUMIN 6.1*   Recent Labs  Lab 12/31/17 1704  LIPASE 23   No results for input(s): AMMONIA in the last 168 hours. CBC: Recent Labs  Lab 12/31/17 1704 01/01/18 0517  WBC 29.3* 16.3*  NEUTROABS 23.2*  --   HGB 18.2* 10.2*  HCT 51.7* 29.0*  MCV 87.3 86.8  PLT 429* 202   Cardiac Enzymes: Recent Labs  Lab 12/31/17 2339  CKTOTAL 297*   BNP: Invalid  input(s): POCBNP CBG: No results for input(s): GLUCAP in the last 168 hours. D-Dimer No results for input(s): DDIMER in the last 72 hours. Hgb A1c No results for input(s): HGBA1C in the last 72 hours. Lipid Profile No results for input(s): CHOL, HDL, LDLCALC, TRIG, CHOLHDL, LDLDIRECT in the last 72 hours. Thyroid function studies No results for input(s): TSH, T4TOTAL, T3FREE, THYROIDAB in the last 72 hours.  Invalid input(s): FREET3 Anemia work up No results for input(s): VITAMINB12, FOLATE, FERRITIN, TIBC, IRON, RETICCTPCT in the last 72 hours. Urinalysis    Component Value Date/Time   COLORURINE YELLOW 12/31/2017 2037   APPEARANCEUR CLOUDY (A) 12/31/2017 2037   LABSPEC 1.023 12/31/2017 2037   PHURINE 5.0 12/31/2017 2037   GLUCOSEU 50 (A) 12/31/2017 2037   HGBUR LARGE (A) 12/31/2017 2037   BILIRUBINUR NEGATIVE 12/31/2017 2037   KETONESUR 5 (A) 12/31/2017 2037   PROTEINUR 100 (A)  12/31/2017 2037   UROBILINOGEN 1.0 08/06/2017 1348   NITRITE NEGATIVE 12/31/2017 2037   LEUKOCYTESUR TRACE (A) 12/31/2017 2037   Sepsis Labs Invalid input(s): PROCALCITONIN,  WBC,  LACTICIDVEN Microbiology Recent Results (from the past 240 hour(s))  Blood Culture (routine x 2)     Status: None (Preliminary result)   Collection Time: 12/31/17  7:11 PM  Result Value Ref Range Status   Specimen Description   Final    BLOOD RIGHT ARM Performed at [facility], [place] [address] [place], [place] [address]    Special Requests   Final    BOTTLES DRAWN AEROBIC AND ANAEROBIC Blood Culture adequate volume   Culture   Final    NO GROWTH < 12 HOURS Performed at [facility], [place] [address], [place], [place] [address]    Report Status PENDING  Incomplete  Blood Culture (routine x 2)     Status: None (Preliminary result)   Collection Time: 12/31/17  7:25 PM  Result Value Ref Range Status   Specimen Description   Final    BLOOD RIGHT WRIST Performed at [facility] [address] [place], [place] [address]    Special Requests   Final    IN PEDIATRIC BOTTLE Blood Culture adequate volume Performed at [facility] [address], [place], [place] [address]    Culture   Final    NO GROWTH < 12 HOURS Performed at [facility] [address], [place], [place] [address]    Report Status PENDING  Incomplete     Time coordinating discharge: Over 30 minutes  SIGNED:   Marene Lenz, MD  [facility] 01/01/2018, 4:21 PM Pager [phone]  If 7PM-7AM, please contact night-coverage [email or web address] Password [email or web address]

## 2018-01-01 NOTE — Progress Notes (Signed)
I was reported that patient is willing to sign AMA from the floor/emergency department.  I went to the bedside and explained the nature of the illness and the importance of  continued medical care.  I explained that we cannot discharge the patient and doing so can cause serious harm on the patient's health and even death. Patient signed [facility] despite my advice.

## 2018-01-01 NOTE — ED Notes (Signed)
Hospitalist at the bedside 

## 2018-01-01 NOTE — Progress Notes (Signed)
Pharmacy Antibiotic Note  Jeanne Mcneil is a 32 y.o. female admitted on 12/31/2017 with sepsis.  Pharmacy has been consulted for zosyn dosing. Enteritis per CT. Presents with N/V.  H/o cyclic vomiting d/t cannibus per EDP note.    - AKI - improving - leukocytosis - improving - no temp documented  Plan: Zosyn 3.375g IV q8h (4 hour infusion).  - TRH hopes to stop tomorrow with additional microbiology results  Height: 5' 2"  (157.5 cm) Weight: 112 lb (50.8 kg) IBW/kg (Calculated) : 50.1  No data recorded.  Recent Labs  Lab 12/31/17 1704 12/31/17 1929 12/31/17 2212 12/31/17 2323 01/01/18 0517  WBC 29.3*  --   --   --  16.3*  CREATININE 7.05*  --   --  4.30* 2.35*  LATICACIDVEN  --  3.37* 1.09  --   --     Estimated Creatinine Clearance: 26.9 mL/min (A) (by C-G formula based on SCr of 2.35 mg/dL (H)).    Allergies  Allergen Reactions  . Keflex [Cephalexin] Other (See Comments)    Reaction:  Bladder spasms  . Nsaids     Stomach bleeding  . Codeine Hives and Itching  . Tape Other (See Comments)    Reaction:  Burning   . Vicodin [Hydrocodone-Acetaminophen] Hives and Itching    Antimicrobials this admission: 2/11 vanco x 1 2/11 zosyn >>  Dose adjustments this admission:  Microbiology results: 2/11 BCx: NGTD 2/11 UCx: pending   Thank you for allowing pharmacy to be a part of this patient's care.  Doreene Eland, PharmD, BCPS.   Pager: [phone] 01/01/2018 9:16 AM

## 2018-01-01 NOTE — Progress Notes (Signed)
Patient signed out AMA, family member just picked patient up from hospital

## 2018-01-02 ENCOUNTER — Telehealth: Payer: Self-pay | Admitting: *Deleted

## 2018-01-02 LAB — URINE CULTURE: CULTURE: NO GROWTH

## 2018-01-02 LAB — CALCIUM, IONIZED: CALCIUM, IONIZED, SERUM: 4.2 mg/dL — AB (ref 4.5–5.6)

## 2018-01-02 NOTE — Telephone Encounter (Signed)
Jeanne Mcneil called this am and left a voice message she just realeased from the hospital and has some kidney issues and can't access her MyChart. Wants a call re: results and also wants to schedule an appt for hysterectomy and find out what she needs to do for that.

## 2018-01-03 NOTE — Telephone Encounter (Signed)
Patient called and left another message requesting an appt to schedule a hysterectomy and she would like her results. Called patient stating I am trying to reach you to return your phone call. Reviewed CT scan results with patient. Told patient I was uncertain of her blood work results but Dr Roselie Awkward can review that with you at your appt on 2/26. Patient verbalized understanding to all & had no questions

## 2018-01-05 LAB — CULTURE, BLOOD (ROUTINE X 2)
CULTURE: NO GROWTH
CULTURE: NO GROWTH
SPECIAL REQUESTS: ADEQUATE
SPECIAL REQUESTS: ADEQUATE

## 2018-01-15 ENCOUNTER — Ambulatory Visit: Payer: Self-pay | Admitting: Obstetrics & Gynecology

## 2018-01-16 ENCOUNTER — Ambulatory Visit: Payer: Medicaid Other | Admitting: General Practice

## 2018-01-16 ENCOUNTER — Ambulatory Visit: Payer: Self-pay | Admitting: Student

## 2018-01-16 ENCOUNTER — Other Ambulatory Visit (HOSPITAL_COMMUNITY)
Admission: RE | Admit: 2018-01-16 | Discharge: 2018-01-16 | Disposition: A | Discharge status: Home / Self Care | Payer: Medicaid Other | Source: Ambulatory Visit | Attending: Obstetrics & Gynecology | Admitting: Obstetrics & Gynecology

## 2018-01-16 DIAGNOSIS — R87810 Cervical high risk human papillomavirus (HPV) DNA test positive: Secondary | ICD-10-CM | POA: Diagnosis not present

## 2018-01-16 DIAGNOSIS — R87612 Low grade squamous intraepithelial lesion on cytologic smear of cervix (LGSIL): Secondary | ICD-10-CM | POA: Diagnosis not present

## 2018-01-16 DIAGNOSIS — Z124 Encounter for screening for malignant neoplasm of cervix: Secondary | ICD-10-CM | POA: Insufficient documentation

## 2018-01-16 DIAGNOSIS — Z202 Contact with and (suspected) exposure to infections with a predominantly sexual mode of transmission: Secondary | ICD-10-CM

## 2018-01-16 DIAGNOSIS — Z1151 Encounter for screening for human papillomavirus (HPV): Secondary | ICD-10-CM | POA: Diagnosis not present

## 2018-01-16 NOTE — Progress Notes (Signed)
Patient here for [facility] today for previously positive gonorrhea. Patient states she thought she was here to see a Dr Roselie Awkward to get scheduled for surgery. Discussed reason for visit today with patient. Patient states she is supposed to be having surgery. Patient states her period is supposed to start 3/4 and she is going to be in a lot of pain. Patient states she needs to see someone, have surgery scheduled, have her white count checked, and she needs a prescription for pain medication because she is going to be in severe pain when her period starts. Patient states her kidneys are failing, she has problems with her liver, her body is shutting down and she is dying of cancer. Patient states she is supposed to be having a hysterectomy which is what she was referred here for. Patient states she feels like no one is helping her. Patient states she calls the office and just gets put on hold for a half hour and then we hang up on her. Patient exhibits severe anxiety/distress. Per chart review, patient has had multiple ER visits but hasn't recently seen a provider. Discussed with patient making an appt to come back and see a doctor and suggested she ask for an appt with ANY doctor as a specific doctor will delay her getting an appt. Patient instructed in self swab & specimen collected. Encouraged patient to make an appt upon leaving. Patient was very frustrated and upset she couldn't see a doctor today. Patient left office very upset. Spoke with Dr Harolyn Rutherford who recommends patient come back to see a doctor to discuss concerns at length with patient. [facility] office consulted and they will make an appt for patient with planned longer visit.

## 2018-01-16 NOTE — Progress Notes (Signed)
I have reviewed the chart and agree with nursing staff's documentation of this patient's encounter.  Verita Schneiders, MD 01/16/2018 11:49 AM

## 2018-01-17 LAB — GC/CHLAMYDIA PROBE AMP (~~LOC~~) NOT AT ARMC
Chlamydia: NEGATIVE
Neisseria Gonorrhea: NEGATIVE

## 2018-01-18 ENCOUNTER — Other Ambulatory Visit (HOSPITAL_COMMUNITY)
Admission: RE | Admit: 2018-01-18 | Discharge: 2018-01-18 | Disposition: A | Discharge status: Home / Self Care | Payer: Medicaid Other | Source: Ambulatory Visit | Attending: Obstetrics and Gynecology | Admitting: Obstetrics and Gynecology

## 2018-01-18 ENCOUNTER — Ambulatory Visit (INDEPENDENT_AMBULATORY_CARE_PROVIDER_SITE_OTHER): Payer: Medicaid Other | Admitting: Obstetrics and Gynecology

## 2018-01-18 ENCOUNTER — Encounter: Payer: Self-pay | Admitting: Oncology

## 2018-01-18 ENCOUNTER — Encounter: Payer: Self-pay | Admitting: Obstetrics and Gynecology

## 2018-01-18 ENCOUNTER — Telehealth: Payer: Self-pay | Admitting: Oncology

## 2018-01-18 VITALS — BP 112/76 | HR 94 | Ht 62.0 in | Wt 123.8 lb

## 2018-01-18 DIAGNOSIS — R102 Pelvic and perineal pain: Secondary | ICD-10-CM | POA: Insufficient documentation

## 2018-01-18 DIAGNOSIS — F419 Anxiety disorder, unspecified: Secondary | ICD-10-CM | POA: Diagnosis not present

## 2018-01-18 DIAGNOSIS — Z124 Encounter for screening for malignant neoplasm of cervix: Secondary | ICD-10-CM | POA: Insufficient documentation

## 2018-01-18 DIAGNOSIS — Z1389 Encounter for screening for other disorder: Secondary | ICD-10-CM | POA: Diagnosis not present

## 2018-01-18 DIAGNOSIS — D72829 Elevated white blood cell count, unspecified: Secondary | ICD-10-CM

## 2018-01-18 DIAGNOSIS — Z131 Encounter for screening for diabetes mellitus: Secondary | ICD-10-CM | POA: Diagnosis not present

## 2018-01-18 DIAGNOSIS — Z5181 Encounter for therapeutic drug level monitoring: Secondary | ICD-10-CM | POA: Diagnosis not present

## 2018-01-18 DIAGNOSIS — Z720 Tobacco use: Secondary | ICD-10-CM

## 2018-01-18 DIAGNOSIS — K219 Gastro-esophageal reflux disease without esophagitis: Secondary | ICD-10-CM | POA: Diagnosis not present

## 2018-01-18 DIAGNOSIS — N809 Endometriosis, unspecified: Secondary | ICD-10-CM | POA: Diagnosis not present

## 2018-01-18 DIAGNOSIS — A549 Gonococcal infection, unspecified: Secondary | ICD-10-CM | POA: Diagnosis not present

## 2018-01-18 DIAGNOSIS — G894 Chronic pain syndrome: Secondary | ICD-10-CM | POA: Diagnosis not present

## 2018-01-18 MED ORDER — NORETHINDRONE 0.35 MG PO TABS: 1.0000 | ORAL_TABLET | Freq: Every day | ORAL | 11 refills | Status: DC

## 2018-01-18 NOTE — Progress Notes (Signed)
GYNECOLOGY OFFICE FOLLOW UP NOTE  History:  32 y.o. [id number] here today for follow up for f/u for exam.  She reports significant pain when periods start, she has trouble sleeping, night sweats, insomnia, vomiting, extreme pain, "feels like a 1000 knives." reports increased constipation on periods. Having periods every 21 days, bleeding for 7 days, reports she is heavy for all seven days. Then lightens from day 7 to 8. Changing pads/tampons every 2 hours during this time. Periods got heavier after a stomach perforation secondary to heavy cough about 3 years ago, and this has made her have ulcers, pain, all stomach issues started after her cauterization for stomach perforation.  Had Mirena IUD for 5 years, had issues with strings causing infection in vagina and had to have Mirena removed and put back in. Used a different type of string as patient had issues with the string on the device.   Past Medical History:  Diagnosis Date  . Anxiety   . Back pain   . Constipation   . Depression   . Diverticulitis 10/31/2017  . GERD (gastroesophageal reflux disease)   . Glaucoma of both eyes   . Hematuria   . History of abnormal cervical Pap smear   . History of chronic gastritis   . History of kidney stones   . Hydronephrosis, right   . Renal calculi    bilateral per ct 0902-2017  . Right ureteral stone   . Urgency of urination   . Uterine fibroid   . Uterine polyp     Past Surgical History:  Procedure Laterality Date  . CYSTOSCOPY W/ URETERAL STENT PLACEMENT Right 07/04/2016   Procedure: CYSTOSCOPY WITH RIGHT RETROGRADE PYELOGRAM, RIGHT URETERAL STENT PLACEMENT;  Surgeon: Ardis Hughs, MD;  Location: [facility];  Service: Urology;  Laterality: Right;  . CYSTOSCOPY WITH RETROGRADE PYELOGRAM, URETEROSCOPY AND STENT PLACEMENT Left 11/28/2007  . CYSTOSCOPY WITH RETROGRADE PYELOGRAM, URETEROSCOPY AND STENT PLACEMENT Right 08/02/2016   Procedure: CYSTOSCOPY WITH RIGHT  RETROGRADE PYELOGRAM,  URETEROSCOPY,  AND STENT EXCHANGE;  Surgeon: Ardis Hughs, MD;  Location: [facility];  Service: Urology;  Laterality: Right;  . ESOPHAGOGASTRODUODENOSCOPY N/A 05/17/2016   Dr. Oneida Alar: LA Grade A esophagitis, mild gastritis s/p biopsy noting reactive gastritis, medium sized hiatal hernia, next EGD WITH PROPOFOL   . INDUCED ABORTION  2009    Current Outpatient Medications:  .  pantoprazole (PROTONIX) 40 MG tablet, Take 1 tablet (40 mg total) by mouth 2 (two) times daily., Disp: 60 tablet, Rfl: 0 .  promethazine (PHENERGAN) 25 MG tablet, Take 1 tablet (25 mg total) by mouth every 6 (six) hours as needed for nausea or vomiting., Disp: 30 tablet, Rfl: 0 .  metoCLOPramide (REGLAN) 10 MG tablet, Take 1 tablet (10 mg total) by mouth every 6 (six) hours as needed for nausea (nausea/headache). (Patient not taking: Reported on 12/31/2017), Disp: 10 tablet, Rfl: 3 .  norethindrone (MICRONOR,CAMILA,ERRIN) 0.35 MG tablet, Take 1 tablet (0.35 mg total) by mouth daily., Disp: 1 Package, Rfl: 11 .  oxyCODONE-acetaminophen (PERCOCET/ROXICET) 5-325 MG tablet, Take 1 tablet by mouth daily as needed for severe pain., Disp: , Rfl:   Current Facility-Administered Medications:  .  azithromycin (ZITHROMAX) tablet 1,000 mg, 1,000 mg, Oral, Daily, Woodroe Mode, MD, 1,000 mg at 12/20/17 1508  The following portions of the patient's history were reviewed and updated as appropriate: allergies, current medications, past family history, past medical history, past social history, past surgical history and problem list.  Review of Systems:  Pertinent items noted in HPI and remainder of comprehensive ROS otherwise negative.   Objective:  Physical Exam BP 112/76   Pulse 94   Ht 5' 2"  (1.575 m)   Wt 123 lb 12.8 oz (56.2 kg)   LMP 12/29/2017   BMI 22.64 kg/m  CONSTITUTIONAL: Well-developed, well-nourished female in no acute distress.  HENT:  Normocephalic, atraumatic. External right and left ear  normal. Oropharynx is clear and moist EYES: Conjunctivae and EOM are normal. Pupils are equal, round, and reactive to light. No scleral icterus.  NECK: Normal range of motion, supple, no masses SKIN: Skin is warm and dry. No rash noted. Not diaphoretic. No erythema. No pallor. NEUROLOGIC: Alert and oriented to person, place, and time. Normal reflexes, muscle tone coordination. No cranial nerve deficit noted. PSYCHIATRIC: Normal mood and affect. Normal behavior. Normal judgment and thought content. CARDIOVASCULAR: Normal heart rate noted RESPIRATORY: Effort and breath sounds normal, no problems with respiration noted ABDOMEN: Soft, no distention noted.   PELVIC: normal appearing external genitalia, normal appearing cervix and vaginal mucosa MUSCULOSKELETAL: Normal range of motion. No edema noted.   Assessment & Plan:  1. Pap smear for cervical cancer screening - Cytology - PAP  2. Gonorrhea - TOC today  3. Pelvic pain Reviewed [place] with patient, 1 3 cm fibroid - With significant menorrhagia, reviewed it is possible she has endometriosis but cannot diagnose that based on ultrasound and that first step for management of pain is hormonal control of periods, recommend IUD or POP as she reports history of HTN and smokes, she declines IUD but is amenable to starting POP - Micronor sent to pharamcy - patient requested pain meds, reviewed that we do not prescribe opiates for chronic pain and she needs to see a PCP and pain doctor, she is agreeable to this  4. Leukocytosis, unspecified type - Ambulatory referral to [facility] - Ambulatory referral to Hematology  Smoking and tobacco cessation was discussed at today's visit for 5 minutes    Routine preventative health maintenance measures emphasized. Please refer to After Visit Summary for other counseling recommendations.   Total face-to-face time with patient: 30 minutes. Over 50% of encounter was spent on counseling and coordination of  care.   Return in about 3 months (around 04/20/2018).   Feliz Beam, M.D. Attending [place], [facility] for [employer], [place]

## 2018-01-18 NOTE — Telephone Encounter (Signed)
Appt has been scheduled for the pt to see Dr. Alen Blew on 3/ at 1115am. Pt aware to arrive 30 minutes early. Letter mailed to the pt.

## 2018-01-22 LAB — CYTOLOGY - PAP
CHLAMYDIA, DNA PROBE: NEGATIVE
HPV (WINDOPATH): DETECTED — AB
Neisseria Gonorrhea: NEGATIVE

## 2018-01-30 ENCOUNTER — Telehealth: Payer: Self-pay | Admitting: General Practice

## 2018-01-30 NOTE — Telephone Encounter (Signed)
Called patient & discussed pap smear results & recommended colposcopy. Told patient someone from our front office staff will contact her with that appt. Patient verbalized understanding to all & had no questions

## 2018-01-30 NOTE — Telephone Encounter (Signed)
-----   Message from Sloan Leiter, MD sent at 01/23/2018 12:59 PM EST ----- LGSIL, + high risk HPV, please schedule for colposcopy

## 2018-02-08 ENCOUNTER — Inpatient Hospital Stay: Payer: Medicaid Other | Attending: Oncology | Admitting: Oncology

## 2018-02-08 ENCOUNTER — Inpatient Hospital Stay: Payer: Medicaid Other

## 2018-02-08 VITALS — BP 114/72 | HR 72 | Temp 98.5°F | Resp 18 | Ht 62.0 in | Wt 128.4 lb

## 2018-02-08 DIAGNOSIS — D72829 Elevated white blood cell count, unspecified: Secondary | ICD-10-CM

## 2018-02-08 DIAGNOSIS — F17210 Nicotine dependence, cigarettes, uncomplicated: Secondary | ICD-10-CM | POA: Insufficient documentation

## 2018-02-08 LAB — CBC WITH DIFFERENTIAL (CANCER CENTER ONLY)
Basophils Absolute: 0.1 10*3/uL (ref 0.0–0.1)
Basophils Relative: 1 %
EOS ABS: 0.1 10*3/uL (ref 0.0–0.5)
Eosinophils Relative: 1 %
HCT: 37.5 % (ref 34.8–46.6)
HEMOGLOBIN: 12.2 g/dL (ref 11.6–15.9)
LYMPHS ABS: 2.6 10*3/uL (ref 0.9–3.3)
Lymphocytes Relative: 30 %
MCH: 29.7 pg (ref 25.1–34.0)
MCHC: 32.6 g/dL (ref 31.5–36.0)
MCV: 91 fL (ref 79.5–101.0)
MONO ABS: 0.6 10*3/uL (ref 0.1–0.9)
MONOS PCT: 6 %
NEUTROS PCT: 62 %
Neutro Abs: 5.3 10*3/uL (ref 1.5–6.5)
Platelet Count: 302 10*3/uL (ref 145–400)
RBC: 4.13 MIL/uL (ref 3.70–5.45)
RDW: 14.8 % — AB (ref 11.2–14.5)
WBC Count: 8.6 10*3/uL (ref 3.9–10.3)

## 2018-02-08 NOTE — Progress Notes (Signed)
Reason for Referral: Leukocytosis.  HPI: 32 year old woman currently of [place] where she lived the majority of her life.  She has a past medical history significant for anxiety and recurrent colitis.  She also heavy smoker continues to smoke daily close to a pack a day.  She was noted to have a leukocytosis during her hospitalization in February 2019.  At that time she presented with nausea and vomiting and her white cell count was as high as 16.3 with a hemoglobin of 10.2 and a platelet count of 202.  On admission her white cell count was 29,000 with a hemoconcentrated hemoglobin of 18 platelet count of 429. Imaging studies of the abdomen and pelvis did not show any acute abnormalities but did show evidence of possible enteritis.  No malignancy was detected.  Her white cell count has been elevated periodically since 2008.  Her white cell count fluctuates from a normal range to as high as 20,000 and always had normal differential.  Her symptoms are predominantly related to abdominal issues and gastroenteritis.  She denies any fevers or chills or lymphadenopathy.   She does not report any headaches, blurry vision, syncope or seizures. Does not report any fevers, chills or sweats.  Does not report any cough, wheezing or hemoptysis.  Does not report any chest pain, palpitation, orthopnea or leg edema.  Does not report any nausea, vomiting or abdominal pain.  Does not report any constipation or diarrhea.  Does not report any skeletal complaints.    Does not report frequency, urgency or hematuria.  Does not report any skin rashes or lesions. Does not report any heat or cold intolerance.  Does not report any lymphadenopathy or petechiae.  Does not report any anxiety or depression.  Remaining review of systems is negative.    Past Medical History:  Diagnosis Date  . Anxiety   . Back pain   . Constipation   . Depression   . Diverticulitis 10/31/2017  . GERD (gastroesophageal reflux disease)   . Glaucoma  of both eyes   . Hematuria   . History of abnormal cervical Pap smear   . History of chronic gastritis   . History of kidney stones   . Hydronephrosis, right   . Renal calculi    bilateral per ct 0902-2017  . Right ureteral stone   . Urgency of urination   . Uterine fibroid   . Uterine polyp   :  Past Surgical History:  Procedure Laterality Date  . CYSTOSCOPY W/ URETERAL STENT PLACEMENT Right 07/04/2016   Procedure: CYSTOSCOPY WITH RIGHT RETROGRADE PYELOGRAM, RIGHT URETERAL STENT PLACEMENT;  Surgeon: Ardis Hughs, MD;  Location: [facility];  Service: Urology;  Laterality: Right;  . CYSTOSCOPY WITH RETROGRADE PYELOGRAM, URETEROSCOPY AND STENT PLACEMENT Left 11/28/2007  . CYSTOSCOPY WITH RETROGRADE PYELOGRAM, URETEROSCOPY AND STENT PLACEMENT Right 08/02/2016   Procedure: CYSTOSCOPY WITH RIGHT  RETROGRADE PYELOGRAM, URETEROSCOPY,  AND STENT EXCHANGE;  Surgeon: Ardis Hughs, MD;  Location[facility];  Service: Urology;  Laterality: Right;  . ESOPHAGOGASTRODUODENOSCOPY N/A 05/17/2016   Dr. Oneida Alar: LA Grade A esophagitis, mild gastritis s/p biopsy noting reactive gastritis, medium sized hiatal hernia, next EGD WITH PROPOFOL   . INDUCED ABORTION  2009  :   Current Outpatient Medications:  .  metoCLOPramide (REGLAN) 10 MG tablet, Take 10 mg by mouth every 6 (six) hours as needed for nausea., Disp: , Rfl:  .  oxyCODONE-acetaminophen (PERCOCET/ROXICET) 5-325 MG tablet, Take 1 tablet by mouth daily as needed for  severe pain., Disp: , Rfl:  .  pantoprazole (PROTONIX) 40 MG tablet, Take 1 tablet (40 mg total) by mouth 2 (two) times daily., Disp: 60 tablet, Rfl: 0 .  promethazine (PHENERGAN) 25 MG tablet, Take 1 tablet (25 mg total) by mouth every 6 (six) hours as needed for nausea or vomiting., Disp: 30 tablet, Rfl: 0 .  norethindrone (MICRONOR,CAMILA,ERRIN) 0.35 MG tablet, Take 1 tablet (0.35 mg total) by mouth daily. (Patient not taking: Reported on 02/08/2018), Disp: 1  Package, Rfl: 11:  Allergies  Allergen Reactions  . Nsaids Other (See Comments)    Stomach bleeding  . Tape Other (See Comments)    Reaction:  Skin blisters and burns. Use paper tape only!!  . Keflex [Cephalexin] Other (See Comments)    Reaction:  Bladder spasms  . Codeine Hives and Itching  . Vicodin [Hydrocodone-Acetaminophen] Hives and Itching  :  Family History  Problem Relation Age of Onset  . Colon cancer Paternal Uncle        Passed away 02/13/15  . Nephrolithiasis Father   . Hypertension Father   . Nephrolithiasis Sister   . Nephrolithiasis Sister   . Nephrolithiasis Sister   . Hypertension Mother   :  Social History   Socioeconomic History  . Marital status: Single    Spouse name: Not on file  . Number of children: 2  . Years of education: 38  . Highest education level: Not on file  Occupational History  . Occupation: Unemployed  Social Needs  . Financial resource strain: Not on file  . Food insecurity:    Worry: Not on file    Inability: Not on file  . Transportation needs:    Medical: Not on file    Non-medical: Not on file  Tobacco Use  . Smoking status: Current Every Day Smoker    Packs/day: 1.00    Years: 20.00    Pack years: 20.00    Types: Cigarettes  . Smokeless tobacco: Never Used  . Tobacco comment: 5-7 cigarettes daily   Substance and Sexual Activity  . Alcohol use: No    Alcohol/week: 2.4 oz    Types: 4 Shots of liquor per week    Comment: states has not used in 2 years  . Drug use: No    Types: Marijuana, Barbituates    Comment: "Only when I feel nauseated and can't function"  . Sexual activity: Yes    Birth control/protection: None  Lifestyle  . Physical activity:    Days per week: Not on file    Minutes per session: Not on file  . Stress: Not on file  Relationships  . Social connections:    Talks on phone: Not on file    Gets together: Not on file    Attends religious service: Not on file    Active member of club or  organization: Not on file    Attends meetings of clubs or organizations: Not on file    Relationship status: Not on file  . Intimate partner violence:    Fear of current or ex partner: Not on file    Emotionally abused: Not on file    Physically abused: Not on file    Forced sexual activity: Not on file  Other Topics Concern  . Not on file  Social History Narrative   Lives with her father and step-mother. Unemployed.  :  Pertinent items are noted in HPI.  Exam: Blood pressure 114/72, pulse 72, temperature 98.5 F (36.9 C),  temperature source Oral, resp. rate 18, height 5' 2"  (1.575 m), weight 128 lb 6.4 oz (58.2 kg), SpO2 99 %. General appearance: alert and cooperative appeared without distress. Head: atraumatic without any abnormalities. Eyes: conjunctivae/corneas clear. PERRL.  Sclera anicteric. Throat: lips, mucosa, and tongue normal; without oral thrush or ulcers. Resp: clear to auscultation bilaterally without rhonchi, wheezes or dullness to percussion. Cardio: regular rate and rhythm, S1, S2 normal, no murmur, click, rub or gallop GI: soft, non-tender; bowel sounds normal; no masses,  no organomegaly Skin: Skin color, texture, turgor normal. No rashes or lesions Lymph nodes: Cervical, supraclavicular, and axillary nodes normal. Neurologic: Grossly normal without any motor, sensory or deep tendon reflexes. Musculoskeletal: No joint deformity or effusion.  CBC    Component Value Date/Time   WBC 16.3 (H) 01/01/2018 0517   RBC 3.34 (L) 01/01/2018 0517   HGB 10.2 (L) 01/01/2018 0517   HCT 29.0 (L) 01/01/2018 0517   PLT 202 01/01/2018 0517   MCV 86.8 01/01/2018 0517   MCH 30.5 01/01/2018 0517   MCHC 35.2 01/01/2018 0517   RDW 14.1 01/01/2018 0517   LYMPHSABS 3.2 12/31/2017 1704   MONOABS 2.9 (H) 12/31/2017 1704   EOSABS 0.0 12/31/2017 1704   BASOSABS 0.0 12/31/2017 1704     Chemistry      Component Value Date/Time   NA 136 01/01/2018 0517   K 3.1 (L) 01/01/2018  0517   CL 101 01/01/2018 0517   CO2 24 01/01/2018 0517   BUN 37 (H) 01/01/2018 0517   CREATININE 2.35 (H) 01/01/2018 0517   CREATININE 0.98 10/23/2016 1237      Component Value Date/Time   CALCIUM 7.4 (L) 01/01/2018 0517   ALKPHOS 86 12/31/2017 1704   AST 45 (H) 12/31/2017 1704   ALT 26 12/31/2017 1704   BILITOT 0.7 12/31/2017 1704       Assessment and Plan:   32 year old woman with the following issues:  1.  Leukocytosis: Her differential has been within normal range with proportionate percentage of neutrophils, lymphocytes and monocytes.  These findings date back to 2008 and appears to be fluctuating.  The differential diagnosis was discussed today with the patient in detail.  Reactive leukocytosis is the likely etiology.  This was related to smoking or possible gastroenteritis flare.  Myeloproliferative disorder such as chronic myelogenous leukemia is also a consideration but considered less likely.  Occult malignancy causing reactive leukocytosis is considered extremely unlikely given her age and multiple imaging studies has been obtained.  From a management standpoint I will obtain a BCR ABL to rule out chronic myelogenous leukemia.  If her testing do not support that diagnosis, I see no further recommendation is needed at this time and no further hematology workup is needed.  2.  Smoking cessation: I advised her to pursue this more seriously given the health implication of continuing smoking.  Smoking cessation will help also with normalizing her white cell count.  3.  Follow-up: Will be determined by her workup.  No further hematology follow-up will be needed if her testing is normal.  30  minutes was spent with the patient face-to-face today.  More than 50% of time was dedicated to patient counseling, education and coordination of the patient's multifaceted care.

## 2018-02-14 ENCOUNTER — Ambulatory Visit (HOSPITAL_COMMUNITY)
Admission: EM | Admit: 2018-02-14 | Discharge: 2018-02-14 | Disposition: A | Discharge status: Home / Self Care | Payer: Medicaid Other

## 2018-02-18 ENCOUNTER — Inpatient Hospital Stay (HOSPITAL_COMMUNITY)
Admission: AD | Admit: 2018-02-18 | Discharge: 2018-02-18 | Disposition: A | Discharge status: Home / Self Care | Payer: Medicaid Other | Source: Ambulatory Visit | Attending: Obstetrics and Gynecology | Admitting: Obstetrics and Gynecology

## 2018-02-18 ENCOUNTER — Other Ambulatory Visit: Payer: Self-pay

## 2018-02-18 ENCOUNTER — Encounter (HOSPITAL_COMMUNITY): Payer: Self-pay | Admitting: *Deleted

## 2018-02-18 DIAGNOSIS — Z3202 Encounter for pregnancy test, result negative: Secondary | ICD-10-CM | POA: Diagnosis not present

## 2018-02-18 DIAGNOSIS — R109 Unspecified abdominal pain: Secondary | ICD-10-CM | POA: Diagnosis present

## 2018-02-18 DIAGNOSIS — Z202 Contact with and (suspected) exposure to infections with a predominantly sexual mode of transmission: Secondary | ICD-10-CM | POA: Diagnosis not present

## 2018-02-18 DIAGNOSIS — F17210 Nicotine dependence, cigarettes, uncomplicated: Secondary | ICD-10-CM | POA: Insufficient documentation

## 2018-02-18 DIAGNOSIS — Z113 Encounter for screening for infections with a predominantly sexual mode of transmission: Secondary | ICD-10-CM

## 2018-02-18 LAB — WET PREP, GENITAL
Sperm: NONE SEEN
Trich, Wet Prep: NONE SEEN
Yeast Wet Prep HPF POC: NONE SEEN

## 2018-02-18 LAB — URINALYSIS, ROUTINE W REFLEX MICROSCOPIC
Bilirubin Urine: NEGATIVE
GLUCOSE, UA: NEGATIVE mg/dL
Hgb urine dipstick: NEGATIVE
Ketones, ur: NEGATIVE mg/dL
Nitrite: NEGATIVE
PROTEIN: NEGATIVE mg/dL
SPECIFIC GRAVITY, URINE: 1.023 (ref 1.005–1.030)
pH: 6 (ref 5.0–8.0)

## 2018-02-18 LAB — CBC
HCT: 37.8 % (ref 36.0–46.0)
HEMOGLOBIN: 12.7 g/dL (ref 12.0–15.0)
MCH: 30.5 pg (ref 26.0–34.0)
MCHC: 33.6 g/dL (ref 30.0–36.0)
MCV: 90.9 fL (ref 78.0–100.0)
PLATELETS: 369 10*3/uL (ref 150–400)
RBC: 4.16 MIL/uL (ref 3.87–5.11)
RDW: 14.8 % (ref 11.5–15.5)
WBC: 9.2 10*3/uL (ref 4.0–10.5)

## 2018-02-18 LAB — BCR ABL1 FISH (GENPATH)

## 2018-02-18 LAB — POCT PREGNANCY, URINE: Preg Test, Ur: NEGATIVE

## 2018-02-18 MED ORDER — METRONIDAZOLE 500 MG PO TABS
2000.0000 mg | ORAL_TABLET | Freq: Once | ORAL | Status: AC
  Administered 2018-02-18: 2000 mg via ORAL
  Filled 2018-02-18: qty 4

## 2018-02-18 NOTE — MAU Note (Signed)
Just had 2nd full period 3/23-3/28.  Went through over 60pads.  Not currently bleeding.  Found out partner has trich, they had sex 3/3; thinks that is what caused the bleeding and the pain.  Having sharp pains in lower abd.

## 2018-02-18 NOTE — Discharge Instructions (Signed)
Trichomoniasis Trichomoniasis is an STI (sexually transmitted infection) that can affect both women and men. In women, the outer area of the female genitalia (vulva) and the vagina are affected. In men, the penis is mainly affected, but the prostate and other reproductive organs can also be involved. This condition can be treated with medicine. It often has no symptoms (is asymptomatic), especially in men. What are the causes? This condition is caused by an organism called Trichomonas vaginalis. Trichomoniasis most often spreads from person to person (is contagious) through sexual contact. What increases the risk? The following factors may make you more likely to develop this condition:  Having unprotected sexual intercourse.  Having sexual intercourse with a partner who has trichomoniasis.  Having multiple sexual partners.  Having had previous trichomoniasis infections or other STIs.  What are the signs or symptoms? In women, symptoms of trichomoniasis include:  Abnormal vaginal discharge that is clear, white, gray, or yellow-green and foamy and has an unusual "fishy" odor.  Itching and irritation of the vagina and vulva.  Burning or pain during urination or sexual intercourse.  Genital redness and swelling.  In men, symptoms of trichomoniasis include:  Penile discharge that may be foamy or contain pus.  Pain in the penis. This may happen only when urinating.  Itching or irritation inside the penis.  Burning after urination or ejaculation.  How is this diagnosed? In women, this condition may be found during a routine Pap test or physical exam. It may be found in men during a routine physical exam. Your health care provider may perform tests to help diagnose this infection, such as:  Urine tests (men and women).  The following in women: ? Testing the pH of the vagina. ? A vaginal swab test that checks for the Trichomonas vaginalis organism. ? Testing vaginal  secretions.  Your health care provider may test you for other STIs, including HIV (human immunodeficiency virus). How is this treated? This condition is treated with medicine taken by mouth (orally), such as metronidazole or tinidazole to fight the infection. Your sexual partner(s) may also need to be tested and treated.  If you are a woman and you plan to become pregnant or think you may be pregnant, tell your health care provider right away. Some medicines that are used to treat the infection should not be taken during pregnancy.  Your health care provider may recommend over-the-counter medicines or creams to help relieve itching or irritation. You may be tested for infection again 3 months after treatment. Follow these instructions at home:  Take and use over-the-counter and prescription medicines, including creams, only as told by your health care provider.  Do not have sexual intercourse until one week after you finish your medicine, or until your health care provider approves. Ask your health care provider when you may resume sexual intercourse.  (Women) Do not douche or wear tampons while you have the infection.  Discuss your infection with your sexual partner(s). Make sure that your partner gets tested and treated, if necessary.  Keep all follow-up visits as told by your health care provider. This is important. How is this prevented?  Use condoms every time you have sex. Using condoms correctly and consistently can help protect against STIs.  Avoid having multiple sexual partners.  Talk with your sexual partner about any symptoms that either of you may have, as well as any history of STIs.  Get tested for STIs and STDs (sexually transmitted diseases) before you have sex. Ask your partner  to do the same.  Do not have sexual contact if you have symptoms of trichomoniasis or another STI. Contact a health care provider if:  You still have symptoms after you finish your  medicine.  You develop pain in your abdomen.  You have pain when you urinate.  You have bleeding after sexual intercourse.  You develop a rash.  You feel nauseous or you vomit.  You plan to become pregnant or think you may be pregnant. Summary  Trichomoniasis is an STI (sexually transmitted infection) that can affect both women and men.  This condition often has no symptoms (is asymptomatic), especially in men.  You should not have sexual intercourse until one week after you finish your medicine, or until your health care provider approves. Ask your health care provider when you may resume sexual intercourse.  Discuss your infection with your sexual partner. Make sure that your partner gets tested and treated, if necessary. This information is not intended to replace advice given to you by your health care provider. Make sure you discuss any questions you have with your health care provider. Document Released: 05/02/2001 Document Revised: 09/29/2016 Document Reviewed: 09/29/2016 Elsevier Interactive Patient Education  2017 [employer].

## 2018-02-18 NOTE — MAU Provider Note (Signed)
History     CSN: [id number]  Arrival date and time: 02/18/18 6226   First Provider Initiated Contact with Patient 02/18/18 1004      Chief Complaint  Patient presents with  . Exposure to STD  . Abdominal Pain   HPI Jeanne Mcneil is a 32 y.o. non pregnant female who presents for abdominal cramping & STD testing. States she was informed by her partner that he was told he had trichomonas. Reports sharp pains in lower abdomen since last week. Pain is intermittent. Currently not having pain. Had heavy menses last week; states she has had heavy periods recently & has an upcoming appointment with Dr. Rosana Hoes. Currently not bleeding. Denies dyspareunia, postcoital bleeding, vaginal discharge, or vaginal bleeding.   Past Medical History:  Diagnosis Date  . Anxiety   . Back pain   . Constipation   . Depression   . Diverticulitis 10/31/2017  . GERD (gastroesophageal reflux disease)   . Glaucoma of both eyes   . Hematuria   . History of abnormal cervical Pap smear   . History of chronic gastritis   . History of kidney stones   . Hydronephrosis, right   . Renal calculi    bilateral per ct 0902-2017  . Right ureteral stone   . Urgency of urination   . Uterine fibroid   . Uterine polyp     Past Surgical History:  Procedure Laterality Date  . CYSTOSCOPY W/ URETERAL STENT PLACEMENT Right 07/04/2016   Procedure: CYSTOSCOPY WITH RIGHT RETROGRADE PYELOGRAM, RIGHT URETERAL STENT PLACEMENT;  Surgeon: Ardis Hughs, MD;  Location: [facility];  Service: Urology;  Laterality: Right;  . CYSTOSCOPY WITH RETROGRADE PYELOGRAM, URETEROSCOPY AND STENT PLACEMENT Left 11/28/2007  . CYSTOSCOPY WITH RETROGRADE PYELOGRAM, URETEROSCOPY AND STENT PLACEMENT Right 08/02/2016   Procedure: CYSTOSCOPY WITH RIGHT  RETROGRADE PYELOGRAM, URETEROSCOPY,  AND STENT EXCHANGE;  Surgeon: Ardis Hughs, MD;  Location: [facility];  Service: Urology;  Laterality: Right;  . ESOPHAGOGASTRODUODENOSCOPY N/A  05/17/2016   Dr. Oneida Alar: LA Grade A esophagitis, mild gastritis s/p biopsy noting reactive gastritis, medium sized hiatal hernia, next EGD WITH PROPOFOL   . INDUCED ABORTION  01/29/08    Family History  Problem Relation Age of Onset  . Colon cancer Paternal Uncle        Passed away 2015/01/28  . Nephrolithiasis Father   . Hypertension Father   . Nephrolithiasis Sister   . Nephrolithiasis Sister   . Nephrolithiasis Sister   . Hypertension Mother     Social History   Tobacco Use  . Smoking status: Current Every Day Smoker    Packs/day: 1.00    Years: 20.00    Pack years: 20.00    Types: Cigarettes  . Smokeless tobacco: Never Used  . Tobacco comment: 5-7 cigarettes daily   Substance Use Topics  . Alcohol use: No    Alcohol/week: 2.4 oz    Types: 4 Shots of liquor per week    Comment: states has not used in 2 years  . Drug use: No    Types: Marijuana, Barbituates    Comment: "Only when I feel nauseated and can't function"    Allergies:  Allergies  Allergen Reactions  . Nsaids Other (See Comments)    Stomach bleeding  . Tape Other (See Comments)    Reaction:  Skin blisters and burns. Use paper tape only!!  . Keflex [Cephalexin] Other (See Comments)    Reaction:  Bladder spasms  . Codeine Hives and  Itching  . Vicodin [Hydrocodone-Acetaminophen] Hives and Itching    Medications Prior to Admission  Medication Sig Dispense Refill Last Dose  . metoCLOPramide (REGLAN) 10 MG tablet Take 10 mg by mouth every 6 (six) hours as needed for nausea.   Taking  . norethindrone (MICRONOR,CAMILA,ERRIN) 0.35 MG tablet Take 1 tablet (0.35 mg total) by mouth daily. (Patient not taking: Reported on 02/08/2018) 1 Package 11 Not Taking  . oxyCODONE-acetaminophen (PERCOCET/ROXICET) 5-325 MG tablet Take 1 tablet by mouth daily as needed for severe pain.   Taking  . pantoprazole (PROTONIX) 40 MG tablet Take 1 tablet (40 mg total) by mouth 2 (two) times daily. 60 tablet 0 Taking  . promethazine  (PHENERGAN) 25 MG tablet Take 1 tablet (25 mg total) by mouth every 6 (six) hours as needed for nausea or vomiting. 30 tablet 0 Taking    Review of Systems  Constitutional: Negative.   Gastrointestinal: Positive for abdominal pain. Negative for nausea and vomiting.  Genitourinary: Negative for dyspareunia, dysuria, vaginal bleeding and vaginal discharge.   Physical Exam   Blood pressure 123/74, pulse 78, temperature 97.8 F (36.6 C), temperature source Oral, resp. rate 18, last menstrual period 02/09/2018.  Physical Exam  Nursing note and vitals reviewed. Constitutional: She is oriented to person, place, and time. She appears well-developed and well-nourished. No distress.  HENT:  Head: Normocephalic and atraumatic.  Eyes: Conjunctivae are normal. Right eye exhibits no discharge. Left eye exhibits no discharge. No scleral icterus.  Neck: Normal range of motion.  Respiratory: Effort normal. No respiratory distress.  GI: Soft. There is no tenderness.  Genitourinary: Uterus normal. Cervix exhibits discharge. Cervix exhibits no motion tenderness and no friability. No bleeding in the vagina. Vaginal discharge found.  Genitourinary Comments: Strawberry cervix  Neurological: She is alert and oriented to person, place, and time.  Skin: Skin is warm and dry. She is not diaphoretic.  Psychiatric: She has a normal mood and affect. Her behavior is normal. Judgment and thought content normal.    MAU Course  Procedures Results for orders placed or performed during the hospital encounter of 02/18/18 (from the past 24 hour(s))  Urinalysis, Routine w reflex microscopic     Status: Abnormal   Collection Time: 02/18/18  9:49 AM  Result Value Ref Range   Color, Urine YELLOW YELLOW   APPearance HAZY (A) CLEAR   Specific Gravity, Urine 1.023 1.005 - 1.030   pH 6.0 5.0 - 8.0   Glucose, UA NEGATIVE NEGATIVE mg/dL   Hgb urine dipstick NEGATIVE NEGATIVE   Bilirubin Urine NEGATIVE NEGATIVE   Ketones,  ur NEGATIVE NEGATIVE mg/dL   Protein, ur NEGATIVE NEGATIVE mg/dL   Nitrite NEGATIVE NEGATIVE   Leukocytes, UA SMALL (A) NEGATIVE   RBC / HPF 0-5 0 - 5 RBC/hpf   WBC, UA 6-30 0 - 5 WBC/hpf   Bacteria, UA RARE (A) NONE SEEN   Squamous Epithelial / LPF 6-30 (A) NONE SEEN   Mucus PRESENT   Pregnancy, urine POC     Status: None   Collection Time: 02/18/18 10:14 AM  Result Value Ref Range   Preg Test, Ur NEGATIVE NEGATIVE  Wet prep, genital     Status: Abnormal   Collection Time: 02/18/18 10:19 AM  Result Value Ref Range   Yeast Wet Prep HPF POC NONE SEEN NONE SEEN   Trich, Wet Prep NONE SEEN NONE SEEN   Clue Cells Wet Prep HPF POC PRESENT (A) NONE SEEN   WBC, Wet Prep HPF POC FEW (  A) NONE SEEN   Sperm NONE SEEN   CBC     Status: None   Collection Time: 02/18/18 10:38 AM  Result Value Ref Range   WBC 9.2 4.0 - 10.5 K/uL   RBC 4.16 3.87 - 5.11 MIL/uL   Hemoglobin 12.7 12.0 - 15.0 g/dL   HCT 37.8 36.0 - 46.0 %   MCV 90.9 78.0 - 100.0 fL   MCH 30.5 26.0 - 34.0 pg   MCHC 33.6 30.0 - 36.0 g/dL   RDW 14.8 11.5 - 15.5 %   Platelets 369 150 - 400 K/uL    MDM UPT negative CBC, HIV, RPR, GC/CT, wet prep Flagyl 2gm PO to tx trich exposure  Assessment and Plan  A: 1. Exposure to trichomonas   2. Screen for STD (sexually transmitted disease)   3. Pregnancy examination or test, negative result    P: Discharge home No intercourse x 2 weeks GC/CT, HIV, RPR pending Keep f/u with Dr. Doristine Johns 02/18/2018, 10:04 AM

## 2018-02-19 LAB — GC/CHLAMYDIA PROBE AMP (~~LOC~~) NOT AT ARMC
Chlamydia: NEGATIVE
Neisseria Gonorrhea: NEGATIVE

## 2018-02-19 LAB — HIV ANTIBODY (ROUTINE TESTING W REFLEX): HIV Screen 4th Generation wRfx: NONREACTIVE

## 2018-02-19 LAB — RPR: RPR: NONREACTIVE

## 2018-02-20 DIAGNOSIS — K582 Mixed irritable bowel syndrome: Secondary | ICD-10-CM | POA: Diagnosis not present

## 2018-03-06 ENCOUNTER — Ambulatory Visit: Payer: Medicaid Other | Admitting: Obstetrics and Gynecology

## 2018-03-06 IMAGING — RF DG RETROGRADE PYELOGRAM
1 series · 10 of 10 positions shown · non-contrast
Comparison: CT abdomen and pelvis 07/04/2016

CLINICAL DATA: Further evaluation of right ureteral calculus

EXAM:
RETROGRADE PYELOGRAM; DG C-ARM 1-60 MIN-NO REPORT

[Series 1: run · 6 acquisitions, 10 frames shown]
[im 1/6]
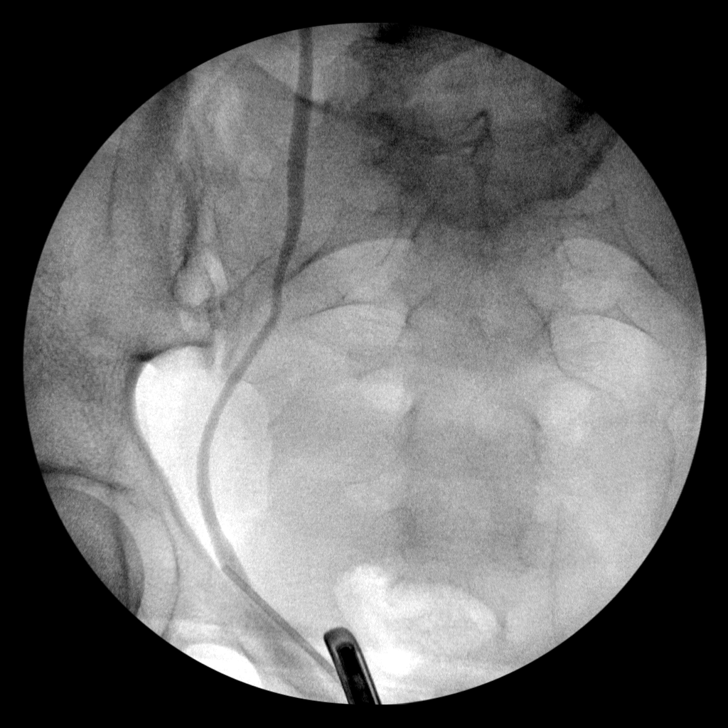
[im 1/6]
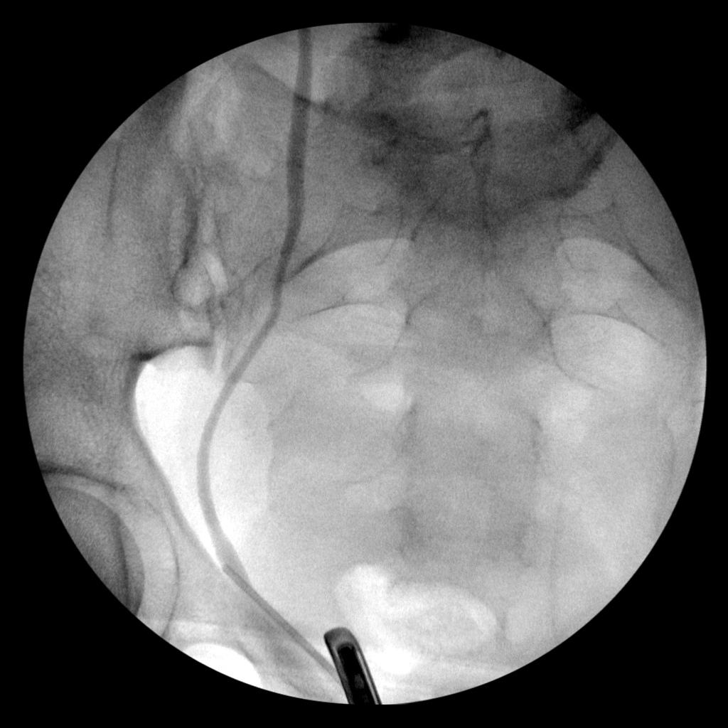
[im 1/6]
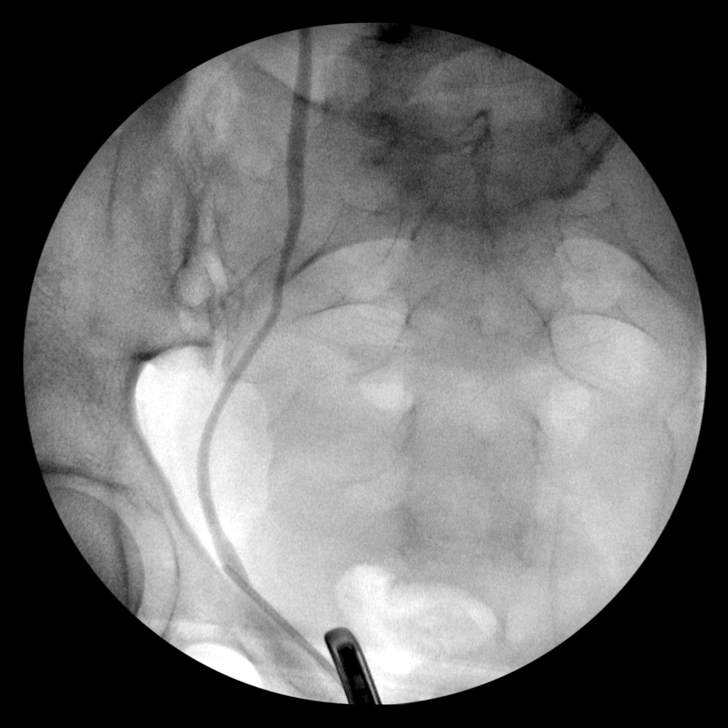
[im 2/6]
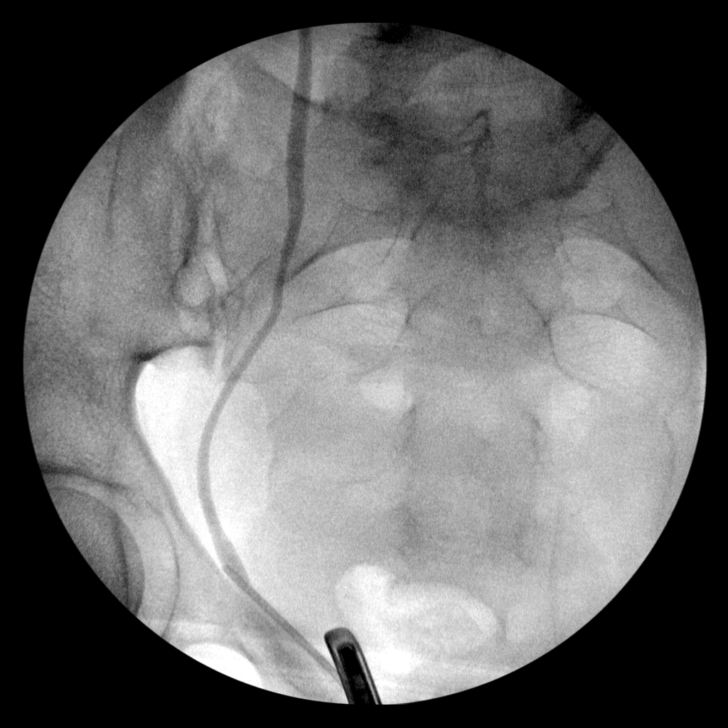
[im 3/6]
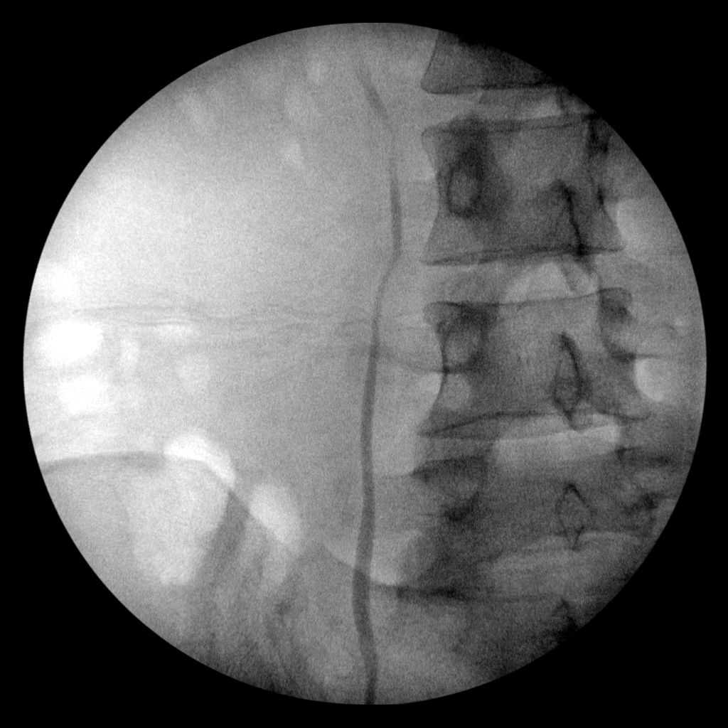
[im 4/6]
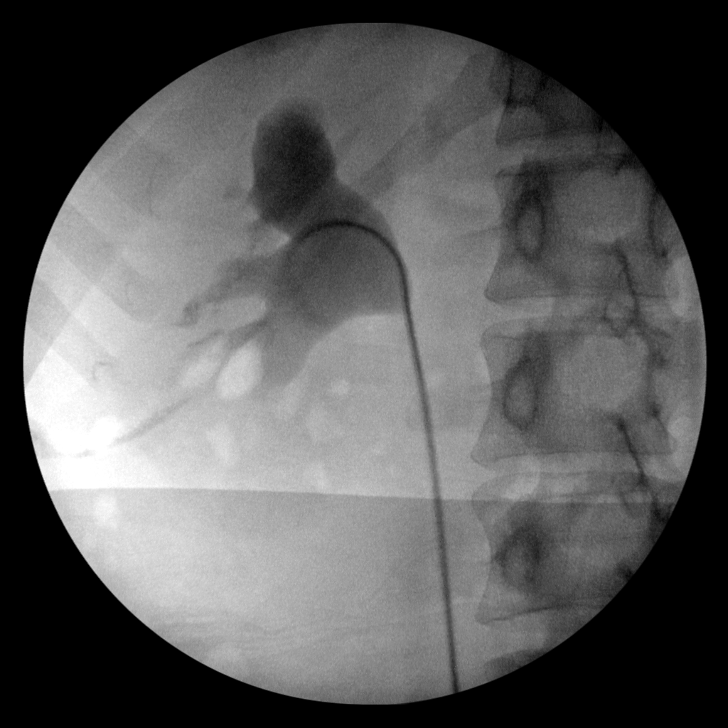
[im 4/6]
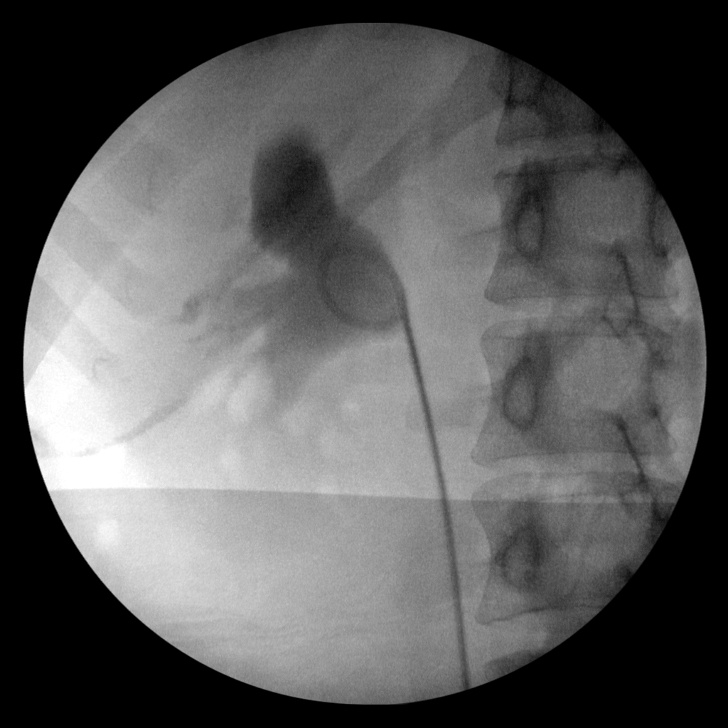
[im 4/6]
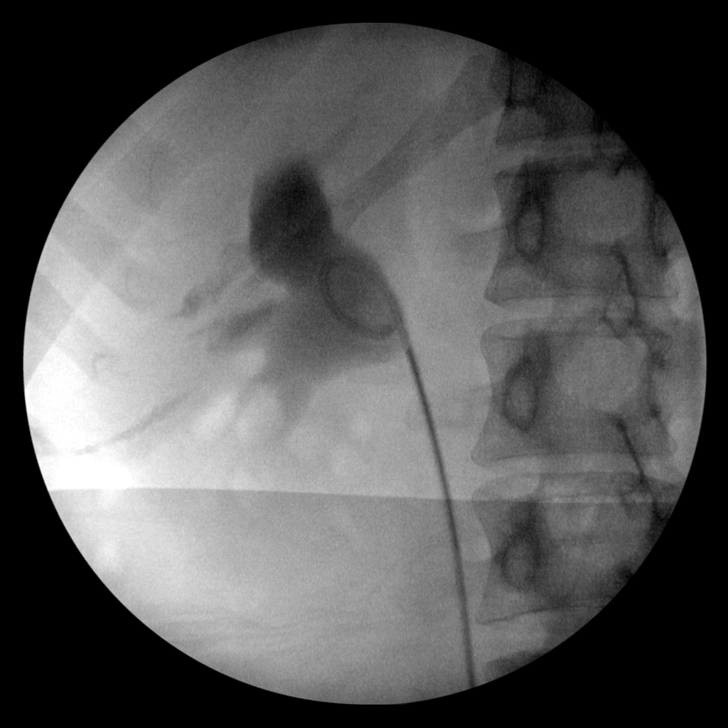
[im 5/6]
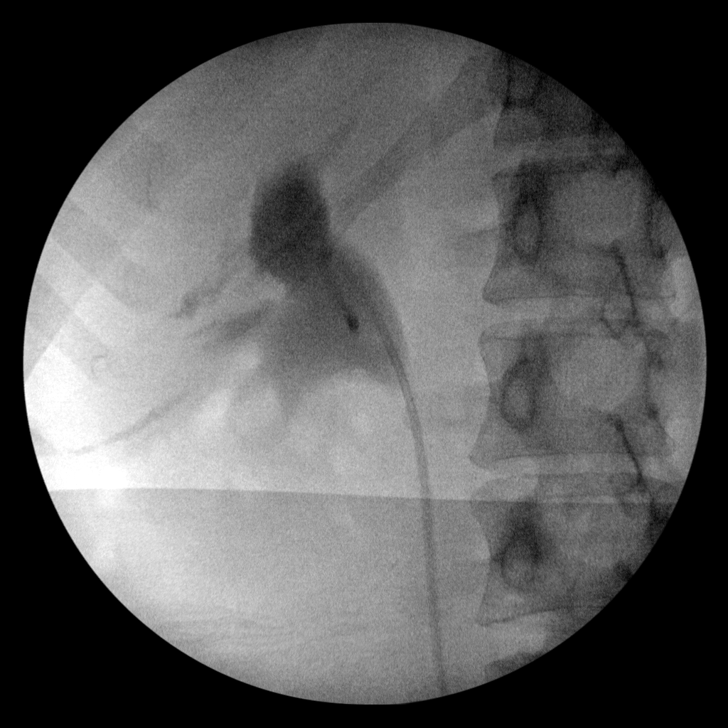
[im 6/6]
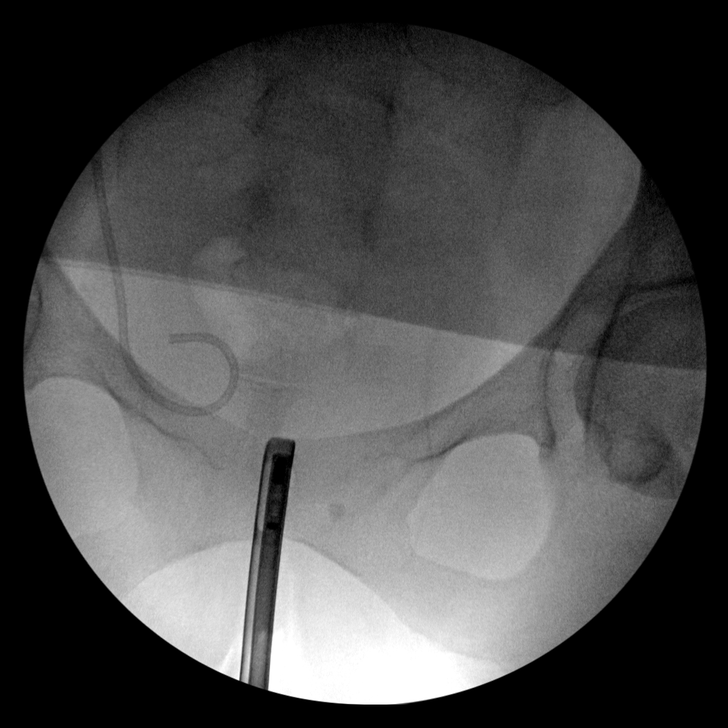

[10 of 10 positions shown; findings below may reference images not displayed]

FINDINGS: Fluoroscopy is performed during retrograde injection of contrast
into the right ureter. A radiation safety time-out was performed.
Fluoroscopy time is recorded at 20 seconds. Six spot fluoroscopic
images were obtained.

Images obtained during retrograde injection of contrast material
into the right ureter demonstrate free flow of contrast material
throughout the right ureter with contrast material seen in dilated
right renal pelvis and calices. Later images obtained demonstrate
placement of a stent into the right ureter with proximal pigtail in
the right renal pelvis. Distal pigtail is not shown.
IMPRESSION: Dilatation of the right renal collecting system of the level of the
ureteropelvic junction with subsequent placement of the ureteral
stent.

## 2018-03-06 IMAGING — CT CT RENAL STONE PROTOCOL
1 series · 15 of 32 positions shown, 19 images · non-contrast
Comparison: 05/14/2016

CLINICAL DATA: Abdominal pain all over with nausea and vomiting for
2 days, smoker

EXAM:
CT ABDOMEN AND PELVIS WITHOUT CONTRAST
TECHNIQUE: Multidetector CT imaging of the abdomen and pelvis was performed
following the standard protocol without IV contrast. Sagittal and
coronal MPR images reconstructed from axial data set. Oral contrast
not administered for this indication

[Series 2: routine abd pel with · axial · 0.68mm/px · z∈[+679,+1099]mm · 15 of 95 slices shown, 19 images]
[im 7/95  soft-tissue]
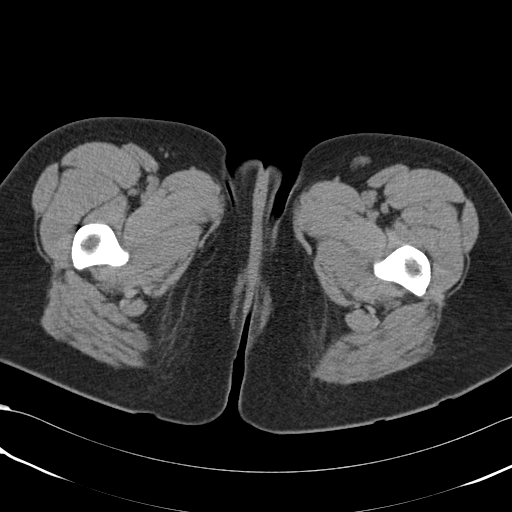
[im 7/95  bone]
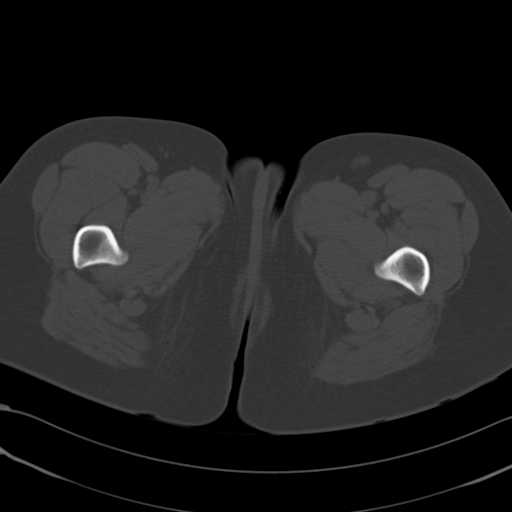
[im 13/95  soft-tissue]
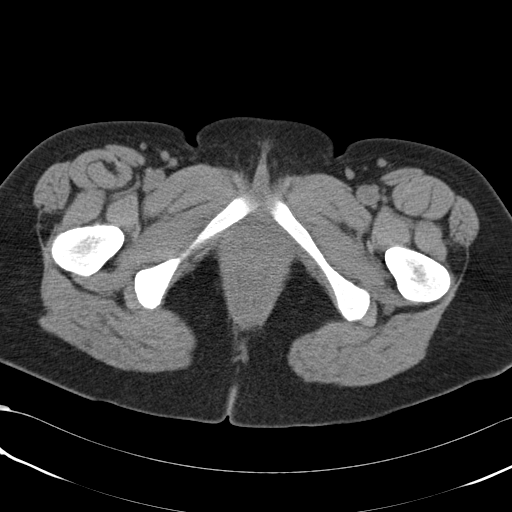
[im 19/95  soft-tissue]
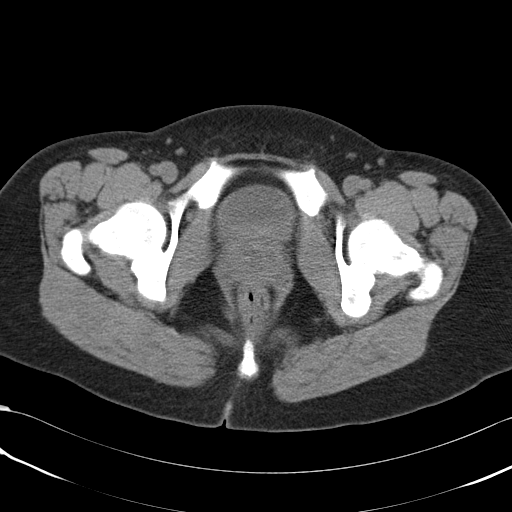
[im 28/95  soft-tissue]
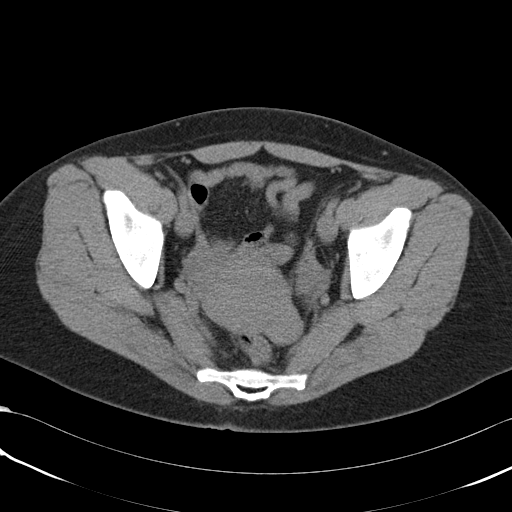
[im 34/95  soft-tissue]
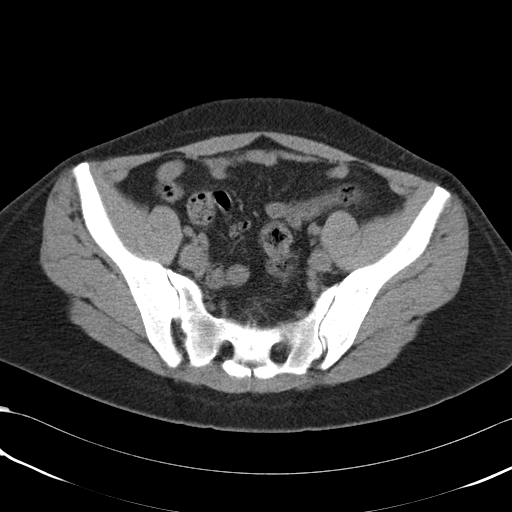
[im 40/95  soft-tissue]
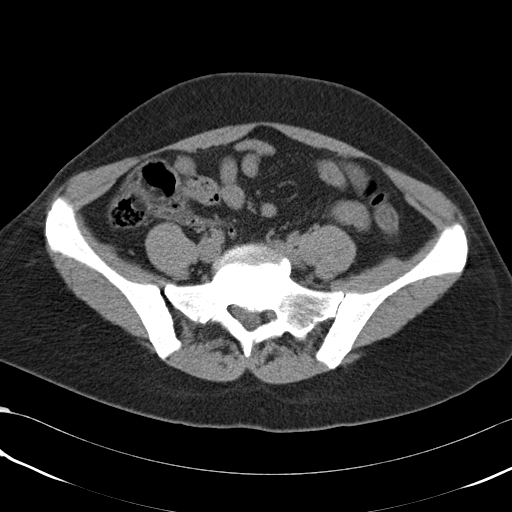
[im 49/95  soft-tissue]
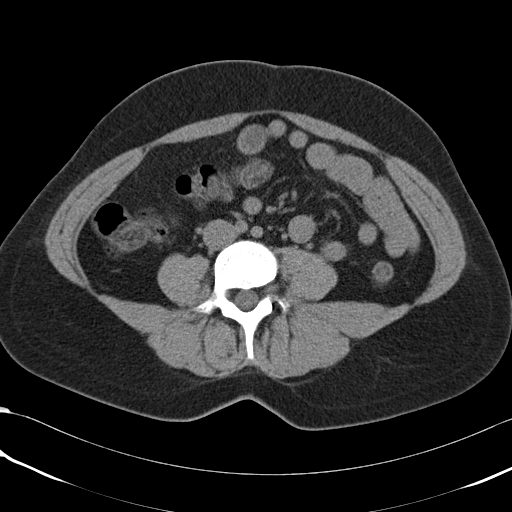
[im 55/95  soft-tissue]
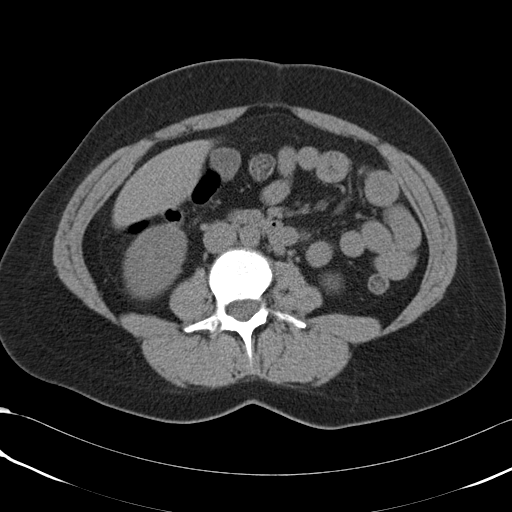
[im 61/95  soft-tissue]
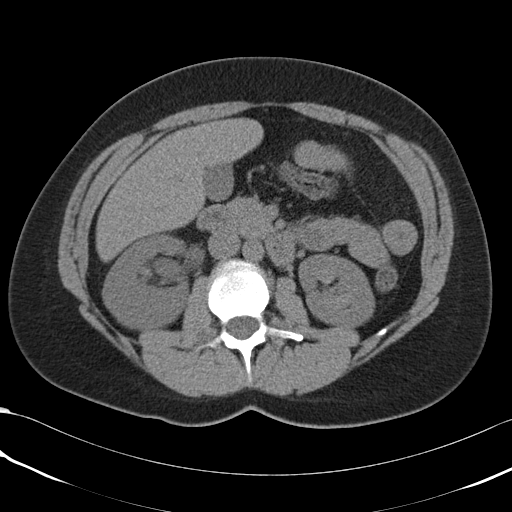
[im 61/95  bone]
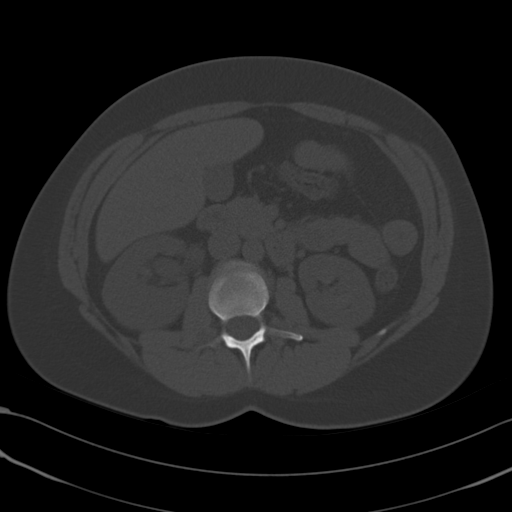
[im 67/95  soft-tissue]
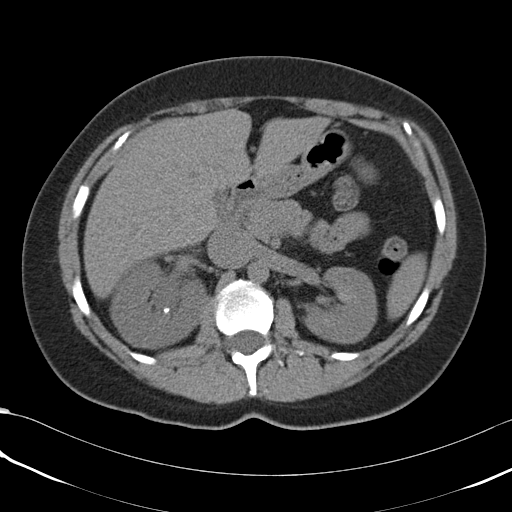
[im 76/95  soft-tissue]
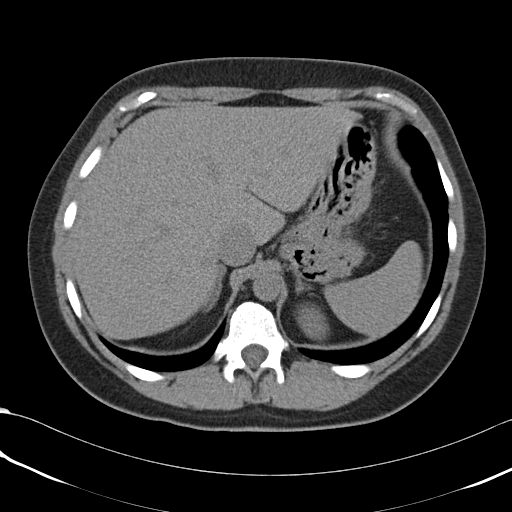
[im 82/95  soft-tissue]
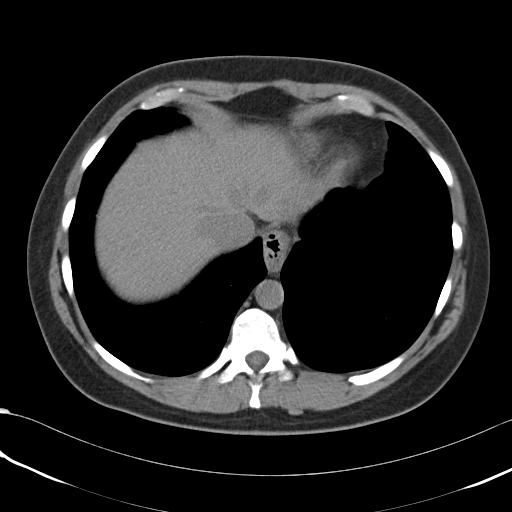
[im 82/95  lung]
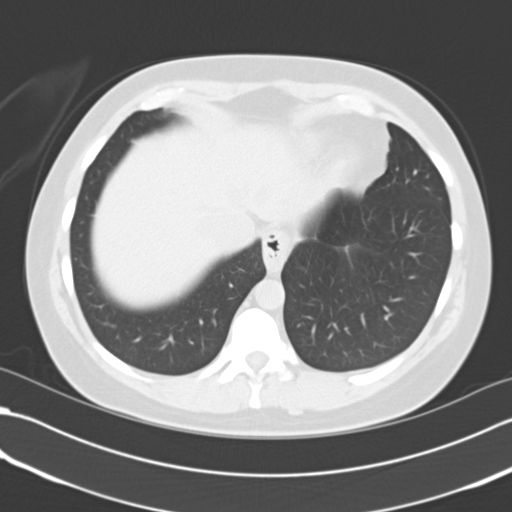
[im 85/95  lung]
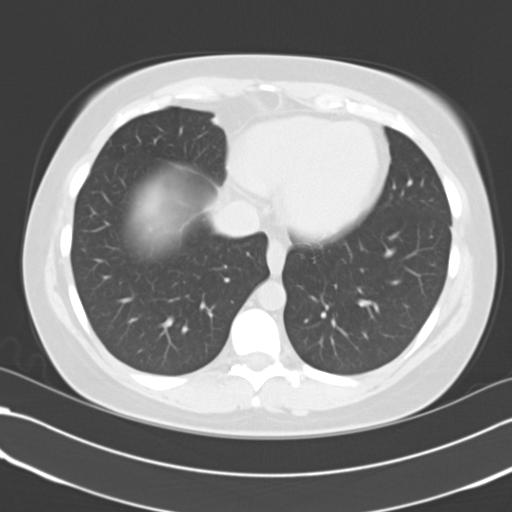
[im 88/95  soft-tissue]
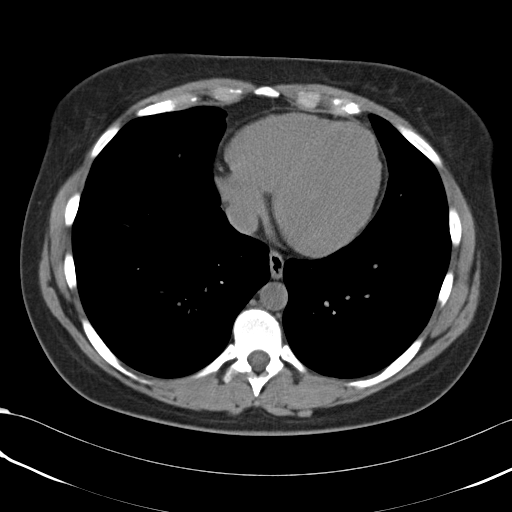
[im 88/95  lung]
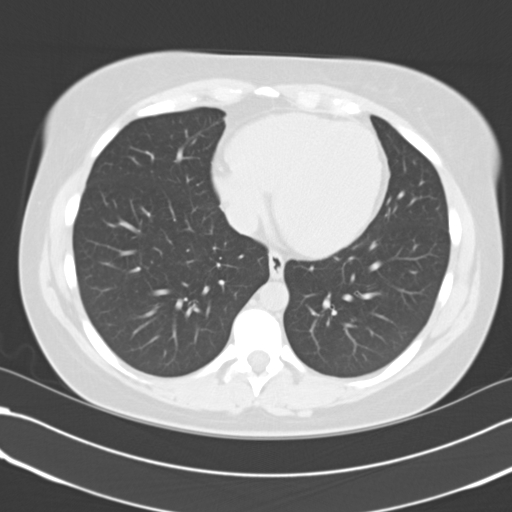
[im 91/95  lung]
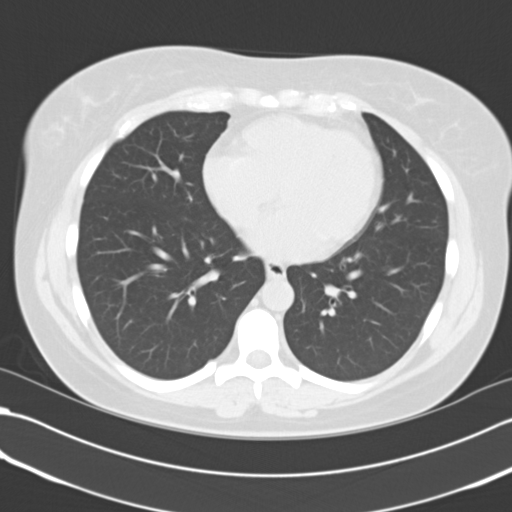

[15 of 32 positions shown; findings below may reference images not displayed]

FINDINGS: Lower chest:  Lung bases clear

Hepatobiliary: Gallbladder and liver normal appearance for technique

Pancreas: Normal appearance

Spleen: Normal appearance

Adrenals/Urinary Tract: Renal glands normal appearance. BILATERAL
nonobstructing renal calculi. RIGHT hydronephrosis secondary to a 4
mm RIGHT UPJ calculus. No ureteral calcification or dilatation.
Bladder decompressed.

Stomach/Bowel: Normal appendix. Small hiatal hernia. Stomach and
bowel loops otherwise normal appearance

Vascular/Lymphatic: Few pelvic phleboliths.  No adenopathy.

Reproductive: Probable small uterine leiomyoma LEFT lateral, sub
serosal, 2.4 x 2.3 cm image 68. Uterus and adnexa otherwise normal
appearance

Other: No free air or free fluid. No acute inflammatory process or
hernia.

Musculoskeletal: Osseous structures unremarkable
IMPRESSION: BILATERAL nonobstructing renal calculi.

Additionally, mild RIGHT hydronephrosis secondary to a 4 mm RIGHT
UPJ calculus.

Small hiatal hernia.

Small uterine leiomyoma.

## 2018-03-08 ENCOUNTER — Emergency Department (HOSPITAL_COMMUNITY)
Admission: EM | Admit: 2018-03-08 | Discharge: 2018-03-08 | Discharge status: Against Medical Advice | Payer: Medicaid Other | Attending: Emergency Medicine | Admitting: Emergency Medicine

## 2018-03-08 ENCOUNTER — Encounter (HOSPITAL_COMMUNITY): Payer: Self-pay | Admitting: Emergency Medicine

## 2018-03-08 DIAGNOSIS — Z5321 Procedure and treatment not carried out due to patient leaving prior to being seen by health care provider: Secondary | ICD-10-CM | POA: Diagnosis not present

## 2018-03-08 DIAGNOSIS — R109 Unspecified abdominal pain: Secondary | ICD-10-CM | POA: Diagnosis present

## 2018-03-08 NOTE — ED Notes (Signed)
NO ANSWER FROM LOBBY

## 2018-03-08 NOTE — ED Notes (Addendum)
Pt was found sitting in restroom floor by housekeeping. Housekeeping then asked pt to move out to lobby and pt stated "No" then proceed to get off floor slam bathroom door in housekeepings face and lock herself in restroom stall. I went in to assist housekeeping with pt.  Pt was sitting in the floor of the batrroom stall on the phone. I asked pt to please get off floor and have a seat in the lobby so she could her her name when called for a room. Pt stated she was in there "getting sick". I explaied to pt that I had been in the restroom and had not observed her " getting sick". I also reminded her she had been medicated for nausea and that she had an emesis bag. I also explained the need for other pts needing to get into the handicap bathroom. Pt the preceded to get off floor and yell at myself and housekeeping. Pt then stated " I.m going to die and i'm just going to go and call my lawyer" Pt then walked outside of lobby and started pacing in front of lobby door.

## 2018-03-08 NOTE — ED Notes (Signed)
Pt noted to not be in lobby at this time

## 2018-03-08 NOTE — ED Triage Notes (Signed)
Per [facility] pt from home for abd and no BM in 5 days.  22g in left hand and given Zofran 42m in route.

## 2018-03-08 NOTE — ED Notes (Signed)
Pt is screaming and thrashing around during triage. Pt continually saying she can not wait and has to be : seen now or im gonna die" explained to pt the triage process, informed her of wait times, the availability of rooms, and that she has been evaluated by the RN. As pt got into the wheelchair to have a seat in the lobby pt was on the phone with someone asking them to come get her that she couldn't wait. I again explained the process to the pt and she continued to scream at me and demanding to see a DR right now. One again triage process was explained to pt. As soon I walked back into triage from lobby pt was seen up and walking out of lobby.

## 2018-03-11 ENCOUNTER — Encounter (HOSPITAL_COMMUNITY): Payer: Self-pay | Admitting: *Deleted

## 2018-03-11 ENCOUNTER — Inpatient Hospital Stay (HOSPITAL_COMMUNITY)
Admission: AD | Admit: 2018-03-11 | Discharge: 2018-03-11 | Discharge status: Against Medical Advice | Payer: Medicaid Other | Source: Ambulatory Visit | Attending: Obstetrics & Gynecology | Admitting: Obstetrics & Gynecology

## 2018-03-11 DIAGNOSIS — Z5321 Procedure and treatment not carried out due to patient leaving prior to being seen by health care provider: Secondary | ICD-10-CM | POA: Insufficient documentation

## 2018-03-11 DIAGNOSIS — R1032 Left lower quadrant pain: Secondary | ICD-10-CM | POA: Diagnosis present

## 2018-03-11 DIAGNOSIS — R112 Nausea with vomiting, unspecified: Secondary | ICD-10-CM | POA: Insufficient documentation

## 2018-03-11 LAB — URINALYSIS, ROUTINE W REFLEX MICROSCOPIC
BILIRUBIN URINE: NEGATIVE
GLUCOSE, UA: 50 mg/dL — AB
Ketones, ur: NEGATIVE mg/dL
Nitrite: NEGATIVE
PH: 5 (ref 5.0–8.0)
Protein, ur: 30 mg/dL — AB
SPECIFIC GRAVITY, URINE: 1.021 (ref 1.005–1.030)

## 2018-03-11 LAB — RAPID URINE DRUG SCREEN, HOSP PERFORMED
AMPHETAMINES: NOT DETECTED
BARBITURATES: NOT DETECTED
Benzodiazepines: POSITIVE — AB
Cocaine: NOT DETECTED
OPIATES: POSITIVE — AB
TETRAHYDROCANNABINOL: POSITIVE — AB

## 2018-03-11 LAB — POCT PREGNANCY, URINE: PREG TEST UR: NEGATIVE

## 2018-03-11 NOTE — MAU Note (Signed)
Pt reports left side, left lower abd pain for 9 days, left kidney pain, nausea, vomiting

## 2018-03-11 NOTE — MAU Note (Signed)
Not in lobby

## 2018-04-03 ENCOUNTER — Emergency Department (HOSPITAL_COMMUNITY)
Admission: EM | Admit: 2018-04-03 | Discharge: 2018-04-03 | Disposition: A | Discharge status: Home / Self Care | Payer: Medicaid Other | Attending: Emergency Medicine | Admitting: Emergency Medicine

## 2018-04-03 DIAGNOSIS — R109 Unspecified abdominal pain: Secondary | ICD-10-CM | POA: Insufficient documentation

## 2018-04-03 DIAGNOSIS — Z5321 Procedure and treatment not carried out due to patient leaving prior to being seen by health care provider: Secondary | ICD-10-CM | POA: Diagnosis not present

## 2018-04-03 NOTE — ED Notes (Signed)
Pt ask if there are any beds in the back, pt told she may have to wait, there are no open beds at this moment.  Pt ripped bp cuff off of arm and states she is going home.

## 2018-04-03 NOTE — ED Triage Notes (Signed)
All over abd pain

## 2018-04-04 ENCOUNTER — Encounter (HOSPITAL_COMMUNITY): Payer: Self-pay | Admitting: *Deleted

## 2018-04-04 ENCOUNTER — Inpatient Hospital Stay (HOSPITAL_COMMUNITY): Payer: Medicaid Other

## 2018-04-04 ENCOUNTER — Emergency Department (HOSPITAL_COMMUNITY)
Admission: EM | Admit: 2018-04-04 | Discharge: 2018-04-04 | Discharge status: Against Medical Advice | Payer: Medicaid Other

## 2018-04-04 ENCOUNTER — Inpatient Hospital Stay (HOSPITAL_COMMUNITY)
Admission: AD | Admit: 2018-04-04 | Discharge: 2018-04-04 | Disposition: A | Discharge status: Home / Self Care | Payer: Medicaid Other | Source: Ambulatory Visit | Attending: Obstetrics and Gynecology | Admitting: Obstetrics and Gynecology

## 2018-04-04 DIAGNOSIS — N281 Cyst of kidney, acquired: Secondary | ICD-10-CM | POA: Diagnosis not present

## 2018-04-04 DIAGNOSIS — Z800 Family history of malignant neoplasm of digestive organs: Secondary | ICD-10-CM | POA: Insufficient documentation

## 2018-04-04 DIAGNOSIS — Z886 Allergy status to analgesic agent status: Secondary | ICD-10-CM | POA: Insufficient documentation

## 2018-04-04 DIAGNOSIS — Z8249 Family history of ischemic heart disease and other diseases of the circulatory system: Secondary | ICD-10-CM | POA: Insufficient documentation

## 2018-04-04 DIAGNOSIS — Z881 Allergy status to other antibiotic agents status: Secondary | ICD-10-CM | POA: Diagnosis not present

## 2018-04-04 DIAGNOSIS — Z841 Family history of disorders of kidney and ureter: Secondary | ICD-10-CM | POA: Insufficient documentation

## 2018-04-04 DIAGNOSIS — Z9889 Other specified postprocedural states: Secondary | ICD-10-CM | POA: Diagnosis not present

## 2018-04-04 DIAGNOSIS — R112 Nausea with vomiting, unspecified: Secondary | ICD-10-CM | POA: Insufficient documentation

## 2018-04-04 DIAGNOSIS — F17210 Nicotine dependence, cigarettes, uncomplicated: Secondary | ICD-10-CM | POA: Insufficient documentation

## 2018-04-04 DIAGNOSIS — F329 Major depressive disorder, single episode, unspecified: Secondary | ICD-10-CM | POA: Diagnosis not present

## 2018-04-04 DIAGNOSIS — Z79899 Other long term (current) drug therapy: Secondary | ICD-10-CM | POA: Insufficient documentation

## 2018-04-04 DIAGNOSIS — N83201 Unspecified ovarian cyst, right side: Secondary | ICD-10-CM | POA: Diagnosis not present

## 2018-04-04 DIAGNOSIS — F1210 Cannabis abuse, uncomplicated: Secondary | ICD-10-CM | POA: Insufficient documentation

## 2018-04-04 DIAGNOSIS — Z87442 Personal history of urinary calculi: Secondary | ICD-10-CM | POA: Diagnosis not present

## 2018-04-04 DIAGNOSIS — R109 Unspecified abdominal pain: Secondary | ICD-10-CM | POA: Diagnosis not present

## 2018-04-04 DIAGNOSIS — H409 Unspecified glaucoma: Secondary | ICD-10-CM | POA: Insufficient documentation

## 2018-04-04 DIAGNOSIS — Z888 Allergy status to other drugs, medicaments and biological substances status: Secondary | ICD-10-CM | POA: Diagnosis not present

## 2018-04-04 DIAGNOSIS — D252 Subserosal leiomyoma of uterus: Secondary | ICD-10-CM | POA: Diagnosis not present

## 2018-04-04 DIAGNOSIS — K219 Gastro-esophageal reflux disease without esophagitis: Secondary | ICD-10-CM | POA: Diagnosis not present

## 2018-04-04 DIAGNOSIS — G8929 Other chronic pain: Secondary | ICD-10-CM | POA: Diagnosis not present

## 2018-04-04 DIAGNOSIS — N189 Chronic kidney disease, unspecified: Secondary | ICD-10-CM | POA: Insufficient documentation

## 2018-04-04 DIAGNOSIS — Z79891 Long term (current) use of opiate analgesic: Secondary | ICD-10-CM | POA: Insufficient documentation

## 2018-04-04 DIAGNOSIS — Z885 Allergy status to narcotic agent status: Secondary | ICD-10-CM | POA: Insufficient documentation

## 2018-04-04 DIAGNOSIS — N83209 Unspecified ovarian cyst, unspecified side: Secondary | ICD-10-CM

## 2018-04-04 DIAGNOSIS — F419 Anxiety disorder, unspecified: Secondary | ICD-10-CM | POA: Diagnosis not present

## 2018-04-04 DIAGNOSIS — R45851 Suicidal ideations: Secondary | ICD-10-CM | POA: Insufficient documentation

## 2018-04-04 DIAGNOSIS — F12188 Cannabis abuse with other cannabis-induced disorder: Secondary | ICD-10-CM

## 2018-04-04 LAB — URINALYSIS, ROUTINE W REFLEX MICROSCOPIC
Bilirubin Urine: NEGATIVE
Glucose, UA: NEGATIVE mg/dL
Ketones, ur: 5 mg/dL — AB
Leukocytes, UA: NEGATIVE
Nitrite: NEGATIVE
PH: 5 (ref 5.0–8.0)
Protein, ur: 100 mg/dL — AB
Specific Gravity, Urine: 1.026 (ref 1.005–1.030)

## 2018-04-04 LAB — COMPREHENSIVE METABOLIC PANEL
ALBUMIN: 5.2 g/dL — AB (ref 3.5–5.0)
ALT: 21 U/L (ref 14–54)
AST: 28 U/L (ref 15–41)
Alkaline Phosphatase: 74 U/L (ref 38–126)
Anion gap: 20 — ABNORMAL HIGH (ref 5–15)
BUN: 42 mg/dL — ABNORMAL HIGH (ref 6–20)
CO2: 27 mmol/L (ref 22–32)
Calcium: 10.4 mg/dL — ABNORMAL HIGH (ref 8.9–10.3)
Chloride: 87 mmol/L — ABNORMAL LOW (ref 101–111)
Creatinine, Ser: 1.79 mg/dL — ABNORMAL HIGH (ref 0.44–1.00)
GFR calc Af Amer: 42 mL/min — ABNORMAL LOW (ref >60)
GFR calc non Af Amer: 36 mL/min — ABNORMAL LOW (ref >60)
GLUCOSE: 131 mg/dL — AB (ref 65–99)
POTASSIUM: 3.7 mmol/L (ref 3.5–5.1)
SODIUM: 134 mmol/L — AB (ref 135–145)
TOTAL PROTEIN: 8.7 g/dL — AB (ref 6.5–8.1)
Total Bilirubin: 1.1 mg/dL (ref 0.3–1.2)

## 2018-04-04 LAB — RAPID URINE DRUG SCREEN, HOSP PERFORMED
Amphetamines: NOT DETECTED
BARBITURATES: NOT DETECTED
Benzodiazepines: NOT DETECTED
Cocaine: NOT DETECTED
Opiates: NOT DETECTED
Tetrahydrocannabinol: POSITIVE — AB

## 2018-04-04 LAB — CBC WITH DIFFERENTIAL/PLATELET
Basophils Absolute: 0.1 10*3/uL (ref 0.0–0.1)
Basophils Relative: 0 %
Eosinophils Absolute: 0 10*3/uL (ref 0.0–0.7)
Eosinophils Relative: 0 %
HCT: 42.7 % (ref 36.0–46.0)
Hemoglobin: 14.7 g/dL (ref 12.0–15.0)
LYMPHS PCT: 22 %
Lymphs Abs: 3.5 10*3/uL (ref 0.7–4.0)
MCH: 29.9 pg (ref 26.0–34.0)
MCHC: 34.4 g/dL (ref 30.0–36.0)
MCV: 86.8 fL (ref 78.0–100.0)
MONO ABS: 2 10*3/uL (ref 0.1–1.0)
MONOS PCT: 13 %
Neutro Abs: 10.7 10*3/uL (ref 1.7–7.7)
Neutrophils Relative %: 65 %
Platelets: 473 10*3/uL — ABNORMAL HIGH (ref 150–400)
RBC: 4.92 MIL/uL (ref 3.87–5.11)
RDW: 14.9 % (ref 11.5–15.5)
WBC: 16.3 10*3/uL — ABNORMAL HIGH (ref 4.0–10.5)

## 2018-04-04 LAB — WET PREP, GENITAL
CLUE CELLS WET PREP: NONE SEEN
Sperm: NONE SEEN
TRICH WET PREP: NONE SEEN
WBC WET PREP: NONE SEEN
YEAST WET PREP: NONE SEEN

## 2018-04-04 LAB — POCT PREGNANCY, URINE: Preg Test, Ur: NEGATIVE

## 2018-04-04 MED ORDER — HYDROMORPHONE HCL 1 MG/ML IJ SOLN
1.0000 mg | Freq: Once | INTRAMUSCULAR | Status: AC
  Administered 2018-04-04: 1 mg via INTRAVENOUS
  Filled 2018-04-04: qty 1

## 2018-04-04 MED ORDER — HALOPERIDOL LACTATE 5 MG/ML IJ SOLN
2.0000 mg | Freq: Once | INTRAMUSCULAR | Status: DC
  Filled 2018-04-04: qty 0.4

## 2018-04-04 MED ORDER — IOPAMIDOL (ISOVUE-300) INJECTION 61%
80.0000 mL | Freq: Once | INTRAVENOUS | Status: AC | PRN
  Administered 2018-04-04: 80 mL via INTRAVENOUS

## 2018-04-04 MED ORDER — HYDROXYZINE HCL 50 MG/ML IM SOLN
25.0000 mg | Freq: Four times a day (QID) | INTRAMUSCULAR | Status: DC | PRN
  Administered 2018-04-04: 25 mg via INTRAMUSCULAR
  Filled 2018-04-04 (×2): qty 0.5

## 2018-04-04 MED ORDER — ONDANSETRON HCL 40 MG/20ML IJ SOLN
8.0000 mg | Freq: Once | INTRAMUSCULAR | Status: AC
  Administered 2018-04-04: 8 mg via INTRAVENOUS
  Filled 2018-04-04: qty 4

## 2018-04-04 MED ORDER — HYDROXYZINE HCL 50 MG/ML IM SOLN
25.0000 mg | Freq: Once | INTRAMUSCULAR | Status: AC
  Administered 2018-04-04: 25 mg via INTRAMUSCULAR
  Filled 2018-04-04: qty 0.5

## 2018-04-04 MED ORDER — HYDROMORPHONE HCL 2 MG/ML IJ SOLN
2.0000 mg | Freq: Once | INTRAMUSCULAR | Status: AC
  Administered 2018-04-04: 2 mg via INTRAVENOUS
  Filled 2018-04-04: qty 1

## 2018-04-04 MED ORDER — FAMOTIDINE IN NACL 20-0.9 MG/50ML-% IV SOLN
20.0000 mg | Freq: Once | INTRAVENOUS | Status: AC
  Administered 2018-04-04: 20 mg via INTRAVENOUS
  Filled 2018-04-04: qty 50

## 2018-04-04 MED ORDER — LACTATED RINGERS IV BOLUS
1000.0000 mL | Freq: Once | INTRAVENOUS | Status: AC
  Administered 2018-04-04: 1000 mL via INTRAVENOUS

## 2018-04-04 NOTE — Progress Notes (Signed)
Per NP Noni Saupe, pt advised to consider IP psych, but patient declined. RN advised patient to follow up w/OP mental health. Patient denied any thoughts of harming herself but stated she just felt hopeless because "they weren't doing anything for her pain." Patient has an appointment with pain management tomorrow afternoon. She denies any pain currently and has received pain medication. She left with a friend and was given all discharge instructions.

## 2018-04-04 NOTE — Progress Notes (Signed)
Patient calm and chatting with sitter.  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  PO contrast given to patient and discussed instructions/wrote down instructions for patient and sitter aware to help.  Patient verbalizes understanding and is cooperative.

## 2018-04-04 NOTE — ED Notes (Signed)
No call in waiting room X2.

## 2018-04-04 NOTE — Progress Notes (Signed)
Sitter at bedside with patient.  Patient states she is feeling hopeless and would hurt herself if she doesn't feel better.  TTS consult done and couldn't clear patient.  Recommending admitting to impatient psych. Requested Noni Saupe, NP, to come as see patient.  Will give pain meds and do CT scan.

## 2018-04-04 NOTE — BH Assessment (Signed)
Tele Assessment Note   Patient Name: Jeanne Mcneil MRN: [id number] Referring Physician: Noni Saupe, NP Location of Patient: [facility] Location of Provider: [place] is a 32 y.o. female in the hospital due to severe abdominal pain. Pt reports minimal psychiatric history (seeing a therapist last year for PTSD). Pt reports dealing with "severe endometriosis" for 2 years with 15 day cycles of severe pain. Pt is writhing in pain throughout the assessment. Pt reports having suicidal thoughts b/c she "can't take the pain no more". Pt denies any suicidal plans. Pt reports onset of SI today. Pt denies any prior suicide attempts. No HI or AVH. Pt reports feeling hopeless.  Case staffed with Earleen Newport, NP. She spoke with pt to determine if pt could contract for safety, but pt continued to say that she would kill herself if her pain continued. IP psych treatment recommended, as a result.   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 Anxiety   . Back pain   . Constipation   . Depression   . Diverticulitis 10/31/2017  . GERD (gastroesophageal reflux disease)   . Glaucoma of both eyes   . Hematuria   . History of abnormal cervical Pap smear   . History of chronic gastritis   . History of kidney stones   . Hydronephrosis, right   . Renal calculi    bilateral per ct 0902-2017  . Right ureteral stone   . Urgency of urination   . Uterine fibroid   . Uterine polyp     Past Surgical History:  Procedure Laterality Date  . CYSTOSCOPY W/ URETERAL STENT PLACEMENT Right 07/04/2016   Procedure: CYSTOSCOPY WITH RIGHT RETROGRADE PYELOGRAM, RIGHT URETERAL STENT PLACEMENT;  Surgeon: Ardis Hughs, MD;  Location: [facility];  Service: Urology;  Laterality: Right;  . CYSTOSCOPY WITH RETROGRADE PYELOGRAM, URETEROSCOPY AND STENT PLACEMENT Left  11/28/2007  . CYSTOSCOPY WITH RETROGRADE PYELOGRAM, URETEROSCOPY AND STENT PLACEMENT Right 08/02/2016   Procedure: CYSTOSCOPY WITH RIGHT  RETROGRADE PYELOGRAM, URETEROSCOPY,  AND STENT EXCHANGE;  Surgeon: Ardis Hughs, MD;  Location: [facility];  Service: Urology;  Laterality: Right;  . ESOPHAGOGASTRODUODENOSCOPY N/A 05/17/2016   Dr. Oneida Alar: LA Grade A esophagitis, mild gastritis s/p biopsy noting reactive gastritis, medium sized hiatal hernia, next EGD WITH PROPOFOL   . INDUCED ABORTION  01/20/08    Family History:  Family History  Problem Relation Age of Onset  . Colon cancer Paternal Uncle        Passed away 01-19-2015  . Nephrolithiasis Father   . Hypertension Father   . Nephrolithiasis Sister   . Nephrolithiasis Sister   . Nephrolithiasis Sister   . Hypertension Mother     Social History:  reports that she has been smoking cigarettes.  She has a 20.00 pack-year smoking history. She has never used smokeless tobacco. She reports that she does not drink alcohol or use drugs.  Additional Social History:  Alcohol / Drug Use Pain Medications: see PTA meds Prescriptions: see PTA meds Over the Counter: see PTA meds History of alcohol / drug use?: Yes(Pt denies but tested positive for THC)  CIWA: CIWA-Ar BP: 116/73 Pulse Rate: 72 COWS:    Allergies:  Allergies  Allergen Reactions  . Nsaids Other (See Comments)    Stomach bleeding  . Tape Other (See Comments)    Reaction:  Skin blisters  and burns. Use paper tape only!!  . Keflex [Cephalexin] Other (See Comments)    Reaction:  Bladder spasms  . Codeine Hives and Itching  . Vicodin [Hydrocodone-Acetaminophen] Hives and Itching    Home Medications:  Medications Prior to Admission  Medication Sig Dispense Refill  . metoCLOPramide (REGLAN) 10 MG tablet Take 10 mg by mouth every 6 (six) hours as needed for nausea.    . Multiple Vitamin (MULTIVITAMIN WITH MINERALS) TABS tablet Take 1 tablet by mouth daily.    .  norethindrone (MICRONOR,CAMILA,ERRIN) 0.35 MG tablet Take 1 tablet (0.35 mg total) by mouth daily. (Patient not taking: Reported on 02/08/2018) 1 Package 11  . oxyCODONE-acetaminophen (PERCOCET/ROXICET) 5-325 MG tablet Take 1 tablet by mouth daily as needed for severe pain.    . pantoprazole (PROTONIX) 40 MG tablet Take 1 tablet (40 mg total) by mouth 2 (two) times daily. 60 tablet 0  . promethazine (PHENERGAN) 25 MG tablet Take 1 tablet (25 mg total) by mouth every 6 (six) hours as needed for nausea or vomiting. 30 tablet 0  . tiZANidine (ZANAFLEX) 4 MG tablet Take 4 mg by mouth every 8 (eight) hours as needed for muscle spasms.      OB/GYN Status:  Patient's last menstrual period was 03/08/2018.  General Assessment Data Location of Assessment: [facility] Assessment Services TTS Assessment: In system Is this a Tele or Face-to-Face Assessment?: Tele Assessment Is this an Initial Assessment or a Re-assessment for this encounter?: Initial Assessment Marital status: Single Is patient pregnant?: No Pregnancy Status: No Living Arrangements: Children Can pt return to current living arrangement?: Yes Admission Status: Voluntary Is patient capable of signing voluntary admission?: Yes Referral Source: Self/Family/Friend Insurance type: Medicaid     Crisis Care Plan Living Arrangements: Children Name of Psychiatrist: none Name of Therapist: none  Education Status Is patient currently in school?: No Is the patient employed, unemployed or receiving disability?: Unemployed  Risk to self with the past 6 months Suicidal Ideation: Yes-Currently Present Has patient been a risk to self within the past 6 months prior to admission? : No Suicidal Intent: No Has patient had any suicidal intent within the past 6 months prior to admission? : No Is patient at risk for suicide?: Yes Suicidal Plan?: No Has patient had any suicidal plan within the past 6 months prior to admission? : No Access to Means:  No Previous Attempts/Gestures: No Intentional Self Injurious Behavior: None Family Suicide History: Unknown Recent stressful life event(s): Recent negative physical changes Persecutory voices/beliefs?: No Depression: Yes Depression Symptoms: Feeling angry/irritable, Tearfulness Substance abuse history and/or treatment for substance abuse?: Yes Suicide prevention information given to non-admitted patients: Not applicable  Risk to Others within the past 6 months Homicidal Ideation: No Does patient have any lifetime risk of violence toward others beyond the six months prior to admission? : No Thoughts of Harm to Others: No Current Homicidal Intent: No Current Homicidal Plan: No Access to Homicidal Means: No History of harm to others?: No Assessment of Violence: None Noted Does patient have access to weapons?: No Criminal Charges Pending?: No Does patient have a court date: No Is patient on probation?: No  Psychosis Hallucinations: None noted Delusions: None noted  Mental Status Report Appearance/Hygiene: Unremarkable Eye Contact: Fair Motor Activity: Agitation Speech: Logical/coherent Level of Consciousness: Alert Mood: Helpless Affect: Appropriate to circumstance Anxiety Level: Moderate Thought Processes: Coherent, Relevant Judgement: Partial Orientation: Person, Place, Time, Situation Obsessive Compulsive Thoughts/Behaviors: None  Cognitive Functioning Concentration: Decreased Memory: Unable to Assess Is  patient IDD: No Is patient DD?: No Insight: Fair Impulse Control: Fair Appetite: Fair Have you had any weight changes? : No Change Sleep: No Change Vegetative Symptoms: None  ADLScreening [facility] Assessment Services) Patient's cognitive ability adequate to safely complete daily activities?: Yes Patient able to express need for assistance with ADLs?: Yes Independently performs ADLs?: Yes (appropriate for developmental age)  Prior Inpatient Therapy Prior  Inpatient Therapy: No  Prior Outpatient Therapy Prior Outpatient Therapy: Yes Prior Therapy Dates: last year Reason for Treatment: PTSD Does patient have an ACCT team?: No Does patient have Intensive In-House Services?  : No Does patient have [facility] services? : No Does patient have P4CC services?: No  ADL Screening (condition at time of admission) Patient's cognitive ability adequate to safely complete daily activities?: Yes Is the patient deaf or have difficulty hearing?: No Does the patient have difficulty seeing, even when wearing glasses/contacts?: No Does the patient have difficulty concentrating, remembering, or making decisions?: No Patient able to express need for assistance with ADLs?: Yes Does the patient have difficulty dressing or bathing?: No Independently performs ADLs?: Yes (appropriate for developmental age) Does the patient have difficulty walking or climbing stairs?: No Weakness of Legs: None Weakness of Arms/Hands: None  Home Assistive Devices/Equipment Home Assistive Devices/Equipment: None  Therapy Consults (therapy consults require a physician order) PT Evaluation Needed: No OT Evalulation Needed: No SLP Evaluation Needed: No Abuse/Neglect Assessment (Assessment to be complete while patient is alone) Abuse/Neglect Assessment Can Be Completed: Yes Physical Abuse: Denies Verbal Abuse: Denies Sexual Abuse: Denies Exploitation of patient/patient's resources: Denies Self-Neglect: Denies     Regulatory affairs officer (For Healthcare) Does Patient Have a Medical Advance Directive?: No, Yes Type of Advance Directive: [place] in Chart?: No - copy requested Nutrition Screen- [place] Adult/[place] Patient's home diet: Regular        Disposition:  Disposition Initial Assessment Completed for this Encounter: Yes  This service was provided via telemedicine using a 2-way, interactive audio and Airline pilot.  Names of all persons participating in this telemedicine service and their role in this encounter.   Rexene Edison 04/04/2018 4:17 PM

## 2018-04-04 NOTE — ED Notes (Signed)
No call in waiting room X3.

## 2018-04-04 NOTE — Discharge Instructions (Signed)

## 2018-04-04 NOTE — ED Notes (Signed)
No call in waiting room X1.

## 2018-04-04 NOTE — MAU Note (Signed)
:  Pos HPT. Pt reports she has had abd pain x 5 days. Had some spotting earlier in the week but none now. C/O N/V as well.

## 2018-04-04 NOTE — MAU Provider Note (Signed)
History     CSN: [id number]  Arrival date and time: 04/04/18 01-11-21   First Provider Initiated Contact with Patient 04/04/18 1531      Chief Complaint  Patient presents with  . Abdominal Pain   HPI   Ms. Jeanne Mcneil is a 32 y.o. female G30P2012 non pregnant female with multiple chronic health conditions and multiple ED visits here in MAU with severe abdominal pain. Says the pain is non-stop, she never has relief. The pain is all day long and occurs every day. The pain is all over her abdomen. (patient is screaming, rocking in the bed, crying and yelling for someone to give her pain medication). Says she is vomiting non-stop; unable to keep foods or fluid down. Attests to smoking marijuana at the beginning of the month but none since. Denies daily use. States the pain is debilitation and "I don't want to go on if the pain continues". Says she has an appointment with pain management tomorrow at 10:00 and does not want to miss that appointment. Attests to marijuana use 2 weeks ago; denies daily use.   OB History    Gravida  3   Para  2   Term  2   Preterm      AB  1   Living  2     SAB  1   TAB      Ectopic      Multiple      Live Births  2           Past Medical History:  Diagnosis Date  . Anxiety   . Back pain   . Constipation   . Depression   . Diverticulitis 10/31/2017  . GERD (gastroesophageal reflux disease)   . Glaucoma of both eyes   . Hematuria   . History of abnormal cervical Pap smear   . History of chronic gastritis   . History of kidney stones   . Hydronephrosis, right   . Renal calculi    bilateral per ct 0902-2017  . Right ureteral stone   . Urgency of urination   . Uterine fibroid   . Uterine polyp     Past Surgical History:  Procedure Laterality Date  . CYSTOSCOPY W/ URETERAL STENT PLACEMENT Right 07/04/2016   Procedure: CYSTOSCOPY WITH RIGHT RETROGRADE PYELOGRAM, RIGHT URETERAL STENT PLACEMENT;  Surgeon: Ardis Hughs, MD;   Location: [facility];  Service: Urology;  Laterality: Right;  . CYSTOSCOPY WITH RETROGRADE PYELOGRAM, URETEROSCOPY AND STENT PLACEMENT Left 11/28/2007  . CYSTOSCOPY WITH RETROGRADE PYELOGRAM, URETEROSCOPY AND STENT PLACEMENT Right 08/02/2016   Procedure: CYSTOSCOPY WITH RIGHT  RETROGRADE PYELOGRAM, URETEROSCOPY,  AND STENT EXCHANGE;  Surgeon: Ardis Hughs, MD;  Location: [facility];  Service: Urology;  Laterality: Right;  . ESOPHAGOGASTRODUODENOSCOPY N/A 05/17/2016   Dr. Oneida Alar: LA Grade A esophagitis, mild gastritis s/p biopsy noting reactive gastritis, medium sized hiatal hernia, next EGD WITH PROPOFOL   . INDUCED ABORTION  2008/01/12    Family History  Problem Relation Age of Onset  . Colon cancer Paternal Uncle        Passed away 2015/01/11  . Nephrolithiasis Father   . Hypertension Father   . Nephrolithiasis Sister   . Nephrolithiasis Sister   . Nephrolithiasis Sister   . Hypertension Mother     Social History   Tobacco Use  . Smoking status: Current Every Day Smoker    Packs/day: 1.00    Years: 20.00    Pack years:  20.00    Types: Cigarettes  . Smokeless tobacco: Never Used  . Tobacco comment: 5-7 cigarettes daily   Substance Use Topics  . Alcohol use: No    Alcohol/week: 2.4 oz    Types: 4 Shots of liquor per week    Comment: states has not used in 2 years  . Drug use: No    Types: Marijuana, Barbituates    Comment: pot in the beginning of the month    Allergies:  Allergies  Allergen Reactions  . Nsaids Other (See Comments)    Stomach bleeding  . Tape Other (See Comments)    Reaction:  Skin blisters and burns. Use paper tape only!!  . Keflex [Cephalexin] Other (See Comments)    Reaction:  Bladder spasms  . Codeine Hives and Itching  . Vicodin [Hydrocodone-Acetaminophen] Hives and Itching    Medications Prior to Admission  Medication Sig Dispense Refill Last Dose  . metoCLOPramide (REGLAN) 10 MG tablet Take 10 mg by mouth every 6 (six) hours as needed  for nausea.   Past Month at Unknown time  . Multiple Vitamin (MULTIVITAMIN WITH MINERALS) TABS tablet Take 1 tablet by mouth daily.   02/17/2018 at Unknown time  . norethindrone (MICRONOR,CAMILA,ERRIN) 0.35 MG tablet Take 1 tablet (0.35 mg total) by mouth daily. (Patient not taking: Reported on 02/08/2018) 1 Package 11 Not Taking  . oxyCODONE-acetaminophen (PERCOCET/ROXICET) 5-325 MG tablet Take 1 tablet by mouth daily as needed for severe pain.   Past Month at Unknown time  . pantoprazole (PROTONIX) 40 MG tablet Take 1 tablet (40 mg total) by mouth 2 (two) times daily. 60 tablet 0 Past Month at Unknown time  . promethazine (PHENERGAN) 25 MG tablet Take 1 tablet (25 mg total) by mouth every 6 (six) hours as needed for nausea or vomiting. 30 tablet 0 Past Month at Unknown time  . tiZANidine (ZANAFLEX) 4 MG tablet Take 4 mg by mouth every 8 (eight) hours as needed for muscle spasms.   Past Week at Unknown time   Results for orders placed or performed during the hospital encounter of 04/04/18 (from the past 48 hour(s))  Urinalysis, Routine w reflex microscopic     Status: Abnormal   Collection Time: 04/04/18  2:40 PM  Result Value Ref Range   Color, Urine AMBER (A) YELLOW    Comment: BIOCHEMICALS MAY BE AFFECTED BY COLOR   APPearance CLOUDY (A) CLEAR   Specific Gravity, Urine 1.026 1.005 - 1.030   pH 5.0 5.0 - 8.0   Glucose, UA NEGATIVE NEGATIVE mg/dL   Hgb urine dipstick SMALL (A) NEGATIVE   Bilirubin Urine NEGATIVE NEGATIVE   Ketones, ur 5 (A) NEGATIVE mg/dL   Protein, ur 100 (A) NEGATIVE mg/dL   Nitrite NEGATIVE NEGATIVE   Leukocytes, UA NEGATIVE NEGATIVE   RBC / HPF 6-10 0 - 5 RBC/hpf   WBC, UA 11-20 0 - 5 WBC/hpf   Bacteria, UA RARE (A) NONE SEEN   Squamous Epithelial / LPF 21-50 0 - 5   Mucus PRESENT    Hyaline Casts, UA PRESENT     Comment: Performed at [facility], [address] [place], [place] [address]  Urine rapid drug screen (hosp performed)     Status: Abnormal    Collection Time: 04/04/18  2:40 PM  Result Value Ref Range   Opiates NONE DETECTED NONE DETECTED   Cocaine NONE DETECTED NONE DETECTED   Benzodiazepines NONE DETECTED NONE DETECTED   Amphetamines NONE DETECTED NONE DETECTED   Tetrahydrocannabinol  POSITIVE (A) NONE DETECTED   Barbiturates NONE DETECTED NONE DETECTED    Comment: (NOTE) DRUG SCREEN FOR MEDICAL PURPOSES ONLY.  IF CONFIRMATION IS NEEDED FOR ANY PURPOSE, NOTIFY LAB WITHIN 5 DAYS. LOWEST DETECTABLE LIMITS FOR URINE DRUG SCREEN Drug Class                     Cutoff (ng/mL) Amphetamine and metabolites    1000 Barbiturate and metabolites    200 Benzodiazepine                 607 Tricyclics and metabolites     300 Opiates and metabolites        300 Cocaine and metabolites        300 THC                            50 Performed at [facility], [address] [place], [place] [address]   Pregnancy, urine POC     Status: None   Collection Time: 04/04/18  2:42 PM  Result Value Ref Range   Preg Test, Ur NEGATIVE NEGATIVE    Comment:        THE SENSITIVITY OF THIS METHODOLOGY IS >24 mIU/mL   CBC with Differential     Status: Abnormal   Collection Time: 04/04/18  2:52 PM  Result Value Ref Range   WBC 16.3 (H) 4.0 - 10.5 K/uL   RBC 4.92 3.87 - 5.11 MIL/uL   Hemoglobin 14.7 12.0 - 15.0 g/dL   HCT 42.7 36.0 - 46.0 %   MCV 86.8 78.0 - 100.0 fL   MCH 29.9 26.0 - 34.0 pg   MCHC 34.4 30.0 - 36.0 g/dL   RDW 14.9 11.5 - 15.5 %   Platelets 473 (H) 150 - 400 K/uL   Neutrophils Relative % 65 %   Neutro Abs 10.7 1.7 - 7.7 K/uL   Lymphocytes Relative 22 %   Lymphs Abs 3.5 0.7 - 4.0 K/uL   Monocytes Relative 13 %   Monocytes Absolute 2.0 0.1 - 1.0 K/uL   Eosinophils Relative 0 %   Eosinophils Absolute 0.0 0.0 - 0.7 K/uL   Basophils Relative 0 %   Basophils Absolute 0.1 0.0 - 0.1 K/uL    Comment: Performed at [facility], [address] [place], [place] [address]  Comprehensive metabolic panel     Status: Abnormal    Collection Time: 04/04/18  3:03 PM  Result Value Ref Range   Sodium 134 (L) 135 - 145 mmol/L   Potassium 3.7 3.5 - 5.1 mmol/L   Chloride 87 (L) 101 - 111 mmol/L   CO2 27 22 - 32 mmol/L   Glucose, Bld 131 (H) 65 - 99 mg/dL   BUN 42 (H) 6 - 20 mg/dL   Creatinine, Ser 1.79 (H) 0.44 - 1.00 mg/dL   Calcium 10.4 (H) 8.9 - 10.3 mg/dL   Total Protein 8.7 (H) 6.5 - 8.1 g/dL   Albumin 5.2 (H) 3.5 - 5.0 g/dL   AST 28 15 - 41 U/L   ALT 21 14 - 54 U/L   Alkaline Phosphatase 74 38 - 126 U/L   Total Bilirubin 1.1 0.3 - 1.2 mg/dL   GFR calc non Af Amer 36 (L) >60 mL/min   GFR calc Af Amer 42 (L) >60 mL/min    Comment: (NOTE) The eGFR has been calculated using the CKD EPI equation. This calculation has not been validated in all  clinical situations. eGFR's persistently <60 mL/min signify possible Chronic Kidney Disease.    Anion gap 20 (H) 5 - 15    Comment: Performed at [facility], [address] [place], [place] [address]  Wet prep, genital     Status: None   Collection Time: 04/04/18  4:16 PM  Result Value Ref Range   Yeast Wet Prep HPF POC NONE SEEN NONE SEEN   Trich, Wet Prep NONE SEEN NONE SEEN   Clue Cells Wet Prep HPF POC NONE SEEN NONE SEEN   WBC, Wet Prep HPF POC NONE SEEN NONE SEEN   Sperm NONE SEEN     Comment: Performed at [facility], [address], [place], [place] [address]   Ct Abdomen Pelvis W Contrast  Result Date: 04/04/2018 CLINICAL DATA:  Severe abdominal pain. Severe endometriosis for 2 years. EXAM: CT ABDOMEN AND PELVIS WITH CONTRAST TECHNIQUE: Multidetector CT imaging of the abdomen and pelvis was performed using the standard protocol following bolus administration of intravenous contrast. CONTRAST:  33m ISOVUE-300 IOPAMIDOL (ISOVUE-300) INJECTION 61% COMPARISON:  12/31/2017 FINDINGS: Lower chest: No acute abnormality. Hepatobiliary: No focal liver abnormality is seen. No gallstones, gallbladder wall thickening, or biliary dilatation. Pancreas:  Unremarkable. No pancreatic ductal dilatation or surrounding inflammatory changes. Spleen: Normal in size without focal abnormality. Adrenals/Urinary Tract: Normal appearance of the adrenal glands. Benign-appearing upper pole 14 mm left renal cyst Stomach/Bowel: Stomach is within normal limits. Appendix appears normal. No evidence of bowel wall thickening, distention, or inflammatory changes. Vascular/Lymphatic: No significant vascular findings are present. No enlarged abdominal or pelvic lymph nodes. Reproductive: Subserosal exophytic fibroid off of the left posterior uterine fundus measuring 2.8 cm. 2.1 cm right ovarian cyst, likely physiologic. Other: No abdominal wall hernia or abnormality. No abdominopelvic ascites. Musculoskeletal: No acute or significant osseous findings. IMPRESSION: No evidence of acute abnormalities within the abdomen or pelvis. Benign-appearing left renal cyst. 2.8 cm subserosal uterine fibroid. 2.1 cm right ovarian cyst, likely physiologic. Electronically Signed   By: DFidela SalisburyM.D.   On: 04/04/2018 19:52   Review of Systems  Gastrointestinal: Positive for abdominal pain, nausea and vomiting.  Genitourinary: Positive for flank pain.  Musculoskeletal: Positive for back pain.  Psychiatric/Behavioral: Positive for agitation and sleep disturbance. The patient is nervous/anxious and is hyperactive.    Physical Exam   Blood pressure 116/73, pulse 72, temperature 98.1 F (36.7 C), resp. rate 18, height _0  (1.575 m), weight 110 lb (49.9 kg), last menstrual period 03/08/2018, SpO2 99 %.  Physical Exam  Constitutional: She is oriented to person, place, and time. She appears well-developed and well-nourished. She appears distressed.  HENT:  Head: Normocephalic.  GI: Soft. Normal appearance. There is generalized tenderness. There is guarding. There is no rigidity and no rebound.  Genitourinary:  Genitourinary Comments: Bimanual exam: Cervix closed, middle, Uterus non  tender, enlarged uterus; greater in the lower uterine segment.  Difficult to assess adnexal tenderness due to patient's pain.  GC/Chlam, wet prep done Chaperone present for exam.   Musculoskeletal: Normal range of motion.  Neurological: She is alert and oriented to person, place, and time.  Skin: Skin is warm.  Psychiatric: Her speech is rapid and/or pressured. She is hyperactive. She expresses impulsivity. She expresses suicidal ideation. She expresses no homicidal ideation. She expresses no suicidal plans and no homicidal plans.   MAU Course  Procedures  None  MDM  Patient states she has thought about ending her life due to the pain. She denies that she has  ever had a plan in place. Says she cannot keep living like this if her pain continues.  Haldol 2 mg IV Zofran 8 mg IV Hydroxyzine 25 mg IV Lr infusion Dilaudid 2 mg given IV WBC's >16k, patient with abdominal pain, N/V: CT scan ordered Ct results discussed with patient: no life threatening results.  Discussed recommendations with patient for IP pysch therapy. Patient declining admission at this time to psych. Says she has an appointment with pain management tomorrow and she cannot miss that appointment.  Says she will not agree for admission at this time. Patient declines suicidal ideation at this time states "I am so thankful my pain is gone". Dilaudid 1 mg given prior to DC along with 25 mg vistaril given IM NO RX given today for pain medication  Discussed CT and labs with Dr. Elly Modena, discussed plan for psych admission however patient refuses. Patient is medically cleared at this time.   Assessment and Plan   A:  1. Chronic abdominal pain   2. Cyst of ovary, unspecified laterality   3. Nausea and vomiting, intractability of vomiting not specified, unspecified vomiting type   4. Cannabis hyperemesis syndrome concurrent with and due to cannabis abuse ([place])   5. Suicidal thoughts   6. Chronic kidney disease, unspecified CKD  stage     P:  Discharge home in stable condition No RX for pain medication given today.  She has nausea medication at home. Stop smoking marijuana Keep your appointment with pain management.   Lezlie Lye, NP 04/06/2018 4:56 PM

## 2018-04-05 LAB — GC/CHLAMYDIA PROBE AMP (~~LOC~~) NOT AT ARMC
CHLAMYDIA, DNA PROBE: NEGATIVE
NEISSERIA GONORRHEA: NEGATIVE

## 2018-04-06 ENCOUNTER — Encounter (HOSPITAL_COMMUNITY): Payer: Self-pay

## 2018-04-06 ENCOUNTER — Other Ambulatory Visit: Payer: Self-pay

## 2018-04-06 ENCOUNTER — Emergency Department (HOSPITAL_COMMUNITY)
Admission: EM | Admit: 2018-04-06 | Discharge: 2018-04-06 | Disposition: A | Discharge status: Home / Self Care | Payer: Medicaid Other | Attending: Emergency Medicine | Admitting: Emergency Medicine

## 2018-04-06 DIAGNOSIS — R1032 Left lower quadrant pain: Secondary | ICD-10-CM | POA: Insufficient documentation

## 2018-04-06 DIAGNOSIS — Z5321 Procedure and treatment not carried out due to patient leaving prior to being seen by health care provider: Secondary | ICD-10-CM | POA: Insufficient documentation

## 2018-04-06 NOTE — ED Triage Notes (Signed)
She states she was seen at [facility]. At which time she was told she was found to have an ovarian cyst. She states her llq area pain "got worse today and I've been throwing up".

## 2018-04-06 NOTE — ED Notes (Signed)
Pt was seen at [facility] on Thursday and told to follow up with pain management at her scheduled appointment yesterday. They wrote her prescription but needs authorazation  For release of meds. Due to this she states she has nothing for pain.

## 2018-04-06 NOTE — ED Notes (Signed)
Pt talking on cell phone stomps out of room stating she can not wait. Leaves facility on her own accord.

## 2018-04-08 ENCOUNTER — Encounter: Payer: Self-pay | Admitting: Obstetrics and Gynecology

## 2018-04-08 ENCOUNTER — Ambulatory Visit: Payer: Medicaid Other | Admitting: Obstetrics and Gynecology

## 2018-04-08 VITALS — BP 90/52 | HR 116 | Ht 62.0 in | Wt 110.0 lb

## 2018-04-08 DIAGNOSIS — G894 Chronic pain syndrome: Secondary | ICD-10-CM

## 2018-04-08 DIAGNOSIS — R4586 Emotional lability: Secondary | ICD-10-CM

## 2018-04-08 DIAGNOSIS — R1030 Lower abdominal pain, unspecified: Secondary | ICD-10-CM

## 2018-04-08 DIAGNOSIS — R102 Pelvic and perineal pain: Secondary | ICD-10-CM

## 2018-04-08 MED ORDER — NORGESTIMATE-ETH ESTRADIOL 0.25-35 MG-MCG PO TABS: 1.0000 | ORAL_TABLET | Freq: Every day | ORAL | 11 refills | Status: DC

## 2018-04-08 NOTE — Progress Notes (Addendum)
Patient ID: Jeanne Mcneil, female   DOB: 1986-07-10, 32 y.o.   MRN: [id number]   [facility] Visit  @DATE @            Patient name: Jeanne Mcneil MRN [id number]  Date of birth: Jul 04, 1986  CC & HPI:  Jeanne Mcneil is a 32 y.o. female presenting today for follow up after a recent ED visit on 04/04/2018 for abdominal pain. At that time it was discovered she had a 2 cm right ovarian cyst.  Patient has an extensive history of emergency room visits over the last couple of years, including psychiatric counseling due to her dramatic complaints and concerns raised about her possible suicidal ideations, based on her pain  She reports severe dysmenorrhea that will bother her for weeks at a time. Adding she is almost always in pain.  She describes the pain as lasting 7 to 15 days around her menses due to her constant pain she would like to get a hysterectomy. She has tried various different treatments for her pain with little to no relief.  She does not report that she is ever been tried on oral contraceptives which is surprising history point. . She had an IUD about 5 years ago and reports "a lot of complications". She is currently on her menstrual that started 04/04/2018. Her pain causes her to sit in the tub for 18-19 hours at a time for 7-15 days. Adding she has about 15 days between cycles when she does not experience as much pain. She will smoke marijuana to help her eat or drink water. She reports being dehydrated. She is on hormone medication, but does not think she has been on birth control pills. She smokes about 8 cigarettes a day. The patient denies fever, chills or any other symptoms or complaints at this time.  She is being followed by the pain management clinic at [facility]. She was prescribed Oxycodone 5 and Tizanidine.   ROS:  ROS +abdominal pain -fever -chills All systems are negative except as noted in the HPI and PMH.   Pertinent History Reviewed:   Reviewed:  Significant for uterine fibroid, uterine polyp, abnormal pap, ovarian cyst, endometriosis  Medical         Past Medical History:  Diagnosis Date  . Anxiety   . Back pain   . Constipation   . Depression   . Diverticulitis 10/31/2017  . GERD (gastroesophageal reflux disease)   . Glaucoma of both eyes   . Hematuria   . History of abnormal cervical Pap smear   . History of chronic gastritis   . History of kidney stones   . Hydronephrosis, right   . Renal calculi    bilateral per ct 0902-2017  . Right ureteral stone   . Urgency of urination   . Uterine fibroid   . Uterine polyp   . Vaginal Pap smear, abnormal                               Surgical Hx:    Past Surgical History:  Procedure Laterality Date  . CYSTOSCOPY W/ URETERAL STENT PLACEMENT Right 07/04/2016   Procedure: CYSTOSCOPY WITH RIGHT RETROGRADE PYELOGRAM, RIGHT URETERAL STENT PLACEMENT;  Surgeon: Ardis Hughs, MD;  Location: [facility];  Service: Urology;  Laterality: Right;  . CYSTOSCOPY WITH RETROGRADE PYELOGRAM, URETEROSCOPY AND STENT PLACEMENT Left 11/28/2007  . CYSTOSCOPY WITH RETROGRADE PYELOGRAM, URETEROSCOPY AND STENT PLACEMENT  Right 08/02/2016   Procedure: CYSTOSCOPY WITH RIGHT  RETROGRADE PYELOGRAM, URETEROSCOPY,  AND STENT EXCHANGE;  Surgeon: Ardis Hughs, MD;  Location[facility];  Service: Urology;  Laterality: Right;  . ESOPHAGOGASTRODUODENOSCOPY N/A 05/17/2016   Dr. Oneida Alar: LA Grade A esophagitis, mild gastritis s/p biopsy noting reactive gastritis, medium sized hiatal hernia, next EGD WITH PROPOFOL   . INDUCED ABORTION  2009   Medications: Reviewed & Updated - see associated section                       Current Outpatient Medications:  .  metoCLOPramide (REGLAN) 10 MG tablet, Take 10 mg by mouth every 6 (six) hours as needed for nausea., Disp: , Rfl:  .  ondansetron (ZOFRAN-ODT) 4 MG disintegrating tablet, Take 4 mg by mouth every 8 (eight) hours as needed for nausea or vomiting.,  Disp: , Rfl:  .  pantoprazole (PROTONIX) 40 MG tablet, Take 1 tablet (40 mg total) by mouth 2 (two) times daily., Disp: 60 tablet, Rfl: 0 .  promethazine (PHENERGAN) 25 MG tablet, Take 1 tablet (25 mg total) by mouth every 6 (six) hours as needed for nausea or vomiting., Disp: 30 tablet, Rfl: 0 .  silver sulfADIAZINE (SILVADENE) 1 % cream, 1 APP 2 TIMES A DAY APPLIED TOPICALLY 14 DAY(S), Disp: , Rfl: 1 .  tiZANidine (ZANAFLEX) 4 MG tablet, Take 4 mg by mouth every 8 (eight) hours as needed for muscle spasms., Disp: , Rfl:    Social History: Reviewed -  reports that she has been smoking cigarettes.  She has a 5.00 pack-year smoking history. She has never used smokeless tobacco.  Objective Findings:  Vitals: Blood pressure (!) 90/52, pulse (!) 116, height 5' 2"  (1.575 m), weight 110 lb (49.9 kg), last menstrual period 04/07/2018.  PHYSICAL EXAMINATION General appearance - alert, well appearing, and in no distress, oriented to person, place, and time and normal appearing weight Mental status - alert, oriented to person, place, and time, normal mood, behavior, speech, dress, motor activity, and thought processes, affect appropriate to mood, agitated  PELVIC Deferred  Discussion: 1. Discussed with pt past medical history, chief complaint and next steps.  At end of discussion, pt had opportunity to ask questions and has no further questions at this time.   Specific discussion as noted above. Greater than 50% was spent in counseling and coordination of care with the patient.   Total time greater than: 25 minutes.    Assessment & Plan:   A:  1. Chronic pelvic pain 2. Hypercalcemia 3. Hx of endometriosis according to patient 4. Emotionally Labile   P:  1. Rx Sprintec 2. Request  And review medical records dr. Smith Mince 3. F/u 1 week and further discussion.  Patient desires to be considered for hysterectomy.  But at this time I do not have sufficient information and need to review chart  further.  By signing my name below, I, Margit Banda, attest that this documentation has been prepared under the direction and in the presence of Jonnie Kind, MD. Electronically Signed: Margit Banda, Medical Scribe. 04/08/18. 10:57 AM.  I personally performed the services described in this documentation, which was SCRIBED in my presence. The recorded information has been reviewed and considered accurate. It has been edited as necessary during review. Jonnie Kind, MD

## 2018-04-18 ENCOUNTER — Encounter: Payer: Self-pay | Admitting: Obstetrics and Gynecology

## 2018-04-18 ENCOUNTER — Ambulatory Visit (INDEPENDENT_AMBULATORY_CARE_PROVIDER_SITE_OTHER): Payer: Medicaid Other | Admitting: Obstetrics and Gynecology

## 2018-04-18 VITALS — BP 120/80 | HR 98 | Ht 62.0 in | Wt 126.0 lb

## 2018-04-18 DIAGNOSIS — Z5329 Procedure and treatment not carried out because of patient's decision for other reasons: Secondary | ICD-10-CM

## 2018-04-22 ENCOUNTER — Ambulatory Visit: Payer: Medicaid Other | Admitting: Obstetrics and Gynecology

## 2018-04-22 ENCOUNTER — Encounter: Payer: Self-pay | Admitting: *Deleted

## 2018-04-24 ENCOUNTER — Telehealth: Payer: Self-pay | Admitting: Obstetrics and Gynecology

## 2018-04-24 NOTE — Telephone Encounter (Signed)
Pt aware that birth control was refilled in May. Pt states she takes continuous BC. I advised to call pharmacy and let them know. Advised if she needs anything from [place], call office back. Pt voiced understanding. Jeanne Mcneil

## 2018-04-24 NOTE — Telephone Encounter (Signed)
Patient called stating that she would like a refill of her BC, pt states that she had to reschedule her appointment for next week. Pt would like a refill until her appointment on 6/12. Please contact pt

## 2018-04-25 ENCOUNTER — Telehealth: Payer: Self-pay | Admitting: *Deleted

## 2018-04-25 NOTE — Telephone Encounter (Signed)
Pharmacy called stating patient is stating patient she is to be taking her birth control pill nonstop through the pack.  Patient is asking for a refill but Jonni Sanger states it is too early. It is not mentioned in the last visit note but if this is the case, the prescription needs to be rewritten.  Please advise.

## 2018-05-01 ENCOUNTER — Encounter: Payer: Self-pay | Admitting: *Deleted

## 2018-05-01 ENCOUNTER — Ambulatory Visit: Payer: Medicaid Other | Admitting: Obstetrics and Gynecology

## 2018-05-01 DIAGNOSIS — Z91199 Patient's noncompliance with other medical treatment and regimen due to unspecified reason: Secondary | ICD-10-CM | POA: Insufficient documentation

## 2018-05-01 DIAGNOSIS — Z5329 Procedure and treatment not carried out because of patient's decision for other reasons: Secondary | ICD-10-CM | POA: Insufficient documentation

## 2018-05-17 ENCOUNTER — Emergency Department (HOSPITAL_COMMUNITY): Payer: Medicaid Other

## 2018-05-17 ENCOUNTER — Emergency Department (HOSPITAL_COMMUNITY)
Admission: EM | Admit: 2018-05-17 | Discharge: 2018-05-17 | Disposition: A | Discharge status: Home / Self Care | Payer: Medicaid Other | Attending: Emergency Medicine | Admitting: Emergency Medicine

## 2018-05-17 ENCOUNTER — Encounter (HOSPITAL_COMMUNITY): Payer: Self-pay

## 2018-05-17 DIAGNOSIS — R55 Syncope and collapse: Secondary | ICD-10-CM

## 2018-05-17 DIAGNOSIS — R109 Unspecified abdominal pain: Secondary | ICD-10-CM | POA: Diagnosis not present

## 2018-05-17 DIAGNOSIS — E871 Hypo-osmolality and hyponatremia: Secondary | ICD-10-CM | POA: Insufficient documentation

## 2018-05-17 DIAGNOSIS — E876 Hypokalemia: Secondary | ICD-10-CM | POA: Diagnosis not present

## 2018-05-17 DIAGNOSIS — G43A0 Cyclical vomiting, not intractable: Secondary | ICD-10-CM | POA: Insufficient documentation

## 2018-05-17 DIAGNOSIS — G8929 Other chronic pain: Secondary | ICD-10-CM | POA: Insufficient documentation

## 2018-05-17 DIAGNOSIS — F17210 Nicotine dependence, cigarettes, uncomplicated: Secondary | ICD-10-CM | POA: Diagnosis not present

## 2018-05-17 DIAGNOSIS — R1115 Cyclical vomiting syndrome unrelated to migraine: Secondary | ICD-10-CM

## 2018-05-17 DIAGNOSIS — Z79899 Other long term (current) drug therapy: Secondary | ICD-10-CM | POA: Diagnosis not present

## 2018-05-17 DIAGNOSIS — E860 Dehydration: Secondary | ICD-10-CM | POA: Diagnosis not present

## 2018-05-17 LAB — URINALYSIS, ROUTINE W REFLEX MICROSCOPIC
BILIRUBIN URINE: NEGATIVE
Glucose, UA: 50 mg/dL — AB
Ketones, ur: NEGATIVE mg/dL
Leukocytes, UA: NEGATIVE
Nitrite: NEGATIVE
Protein, ur: 30 mg/dL — AB
SPECIFIC GRAVITY, URINE: 1.018 (ref 1.005–1.030)
pH: 5 (ref 5.0–8.0)

## 2018-05-17 LAB — I-STAT CHEM 8, ED
BUN: 47 mg/dL — ABNORMAL HIGH (ref 6–20)
CALCIUM ION: 0.97 mmol/L — AB (ref 1.15–1.40)
Chloride: 90 mmol/L — ABNORMAL LOW (ref 98–111)
Creatinine, Ser: 1.3 mg/dL — ABNORMAL HIGH (ref 0.44–1.00)
Glucose, Bld: 96 mg/dL (ref 70–99)
HCT: 39 % (ref 36.0–46.0)
Hemoglobin: 13.3 g/dL (ref 12.0–15.0)
Potassium: 3.8 mmol/L (ref 3.5–5.1)
SODIUM: 130 mmol/L — AB (ref 135–145)
TCO2: 29 mmol/L (ref 22–32)

## 2018-05-17 LAB — RAPID URINE DRUG SCREEN, HOSP PERFORMED
Amphetamines: NOT DETECTED
Benzodiazepines: NOT DETECTED
COCAINE: NOT DETECTED
OPIATES: NOT DETECTED
TETRAHYDROCANNABINOL: POSITIVE — AB

## 2018-05-17 LAB — CBC WITH DIFFERENTIAL/PLATELET
BASOS ABS: 0 10*3/uL (ref 0.0–0.1)
Basophils Relative: 0 %
EOS PCT: 0 %
Eosinophils Absolute: 0 10*3/uL (ref 0.0–0.7)
HCT: 41.3 % (ref 36.0–46.0)
Hemoglobin: 14.1 g/dL (ref 12.0–15.0)
LYMPHS ABS: 1.8 10*3/uL (ref 0.7–4.0)
LYMPHS PCT: 12 %
MCH: 29.6 pg (ref 26.0–34.0)
MCHC: 34.1 g/dL (ref 30.0–36.0)
MCV: 86.8 fL (ref 78.0–100.0)
MONO ABS: 1.7 10*3/uL — AB (ref 0.1–1.0)
Monocytes Relative: 11 %
Neutro Abs: 11.5 10*3/uL — ABNORMAL HIGH (ref 1.7–7.7)
Neutrophils Relative %: 77 %
Platelets: 327 10*3/uL (ref 150–400)
RBC: 4.76 MIL/uL (ref 3.87–5.11)
RDW: 13.8 % (ref 11.5–15.5)
WBC: 15.1 10*3/uL — AB (ref 4.0–10.5)

## 2018-05-17 LAB — COMPREHENSIVE METABOLIC PANEL
ALT: 19 U/L (ref 0–44)
AST: 27 U/L (ref 15–41)
Albumin: 4.2 g/dL (ref 3.5–5.0)
Alkaline Phosphatase: 41 U/L (ref 38–126)
Anion gap: 13 (ref 5–15)
BILIRUBIN TOTAL: 1 mg/dL (ref 0.3–1.2)
BUN: 66 mg/dL — AB (ref 6–20)
CHLORIDE: 83 mmol/L — AB (ref 98–111)
CO2: 31 mmol/L (ref 22–32)
CREATININE: 1.37 mg/dL — AB (ref 0.44–1.00)
Calcium: 8.7 mg/dL — ABNORMAL LOW (ref 8.9–10.3)
GFR calc Af Amer: 58 mL/min — ABNORMAL LOW (ref >60)
GFR, EST NON AFRICAN AMERICAN: 50 mL/min — AB (ref >60)
GLUCOSE: 124 mg/dL — AB (ref 70–99)
Potassium: 2.6 mmol/L — CL (ref 3.5–5.1)
Sodium: 127 mmol/L — ABNORMAL LOW (ref 135–145)
Total Protein: 7.6 g/dL (ref 6.5–8.1)

## 2018-05-17 LAB — POC URINE PREG, ED: Preg Test, Ur: NEGATIVE

## 2018-05-17 LAB — MAGNESIUM: Magnesium: 3.1 mg/dL — ABNORMAL HIGH (ref 1.7–2.4)

## 2018-05-17 MED ORDER — POTASSIUM CHLORIDE CRYS ER 20 MEQ PO TBCR: 20.0000 meq | EXTENDED_RELEASE_TABLET | Freq: Two times a day (BID) | ORAL | 0 refills | Status: DC

## 2018-05-17 MED ORDER — PANTOPRAZOLE SODIUM 40 MG PO TBEC: 40.0000 mg | DELAYED_RELEASE_TABLET | Freq: Every day | ORAL | 0 refills | Status: DC

## 2018-05-17 MED ORDER — POTASSIUM CHLORIDE 10 MEQ/100ML IV SOLN
10.0000 meq | Freq: Once | INTRAVENOUS | Status: AC
  Administered 2018-05-17: 10 meq via INTRAVENOUS
  Filled 2018-05-17: qty 100

## 2018-05-17 MED ORDER — PROMETHAZINE HCL 25 MG/ML IJ SOLN
25.0000 mg | Freq: Once | INTRAMUSCULAR | Status: AC
  Administered 2018-05-17: 25 mg via INTRAVENOUS
  Filled 2018-05-17: qty 1

## 2018-05-17 MED ORDER — POTASSIUM CHLORIDE CRYS ER 20 MEQ PO TBCR
40.0000 meq | EXTENDED_RELEASE_TABLET | Freq: Once | ORAL | Status: DC
  Filled 2018-05-17: qty 2

## 2018-05-17 MED ORDER — POTASSIUM CHLORIDE CRYS ER 20 MEQ PO TBCR
40.0000 meq | EXTENDED_RELEASE_TABLET | Freq: Once | ORAL | Status: AC
  Administered 2018-05-17: 40 meq via ORAL
  Filled 2018-05-17: qty 2

## 2018-05-17 MED ORDER — PANTOPRAZOLE SODIUM 40 MG PO TBEC
40.0000 mg | DELAYED_RELEASE_TABLET | Freq: Every day | ORAL | Status: DC
  Administered 2018-05-17: 40 mg via ORAL
  Filled 2018-05-17: qty 1

## 2018-05-17 MED ORDER — FAMOTIDINE IN NACL 20-0.9 MG/50ML-% IV SOLN
20.0000 mg | Freq: Once | INTRAVENOUS | Status: AC
  Administered 2018-05-17: 20 mg via INTRAVENOUS
  Filled 2018-05-17: qty 50

## 2018-05-17 MED ORDER — SODIUM CHLORIDE 0.9 % IV BOLUS
1000.0000 mL | Freq: Once | INTRAVENOUS | Status: AC
  Administered 2018-05-17: 1000 mL via INTRAVENOUS

## 2018-05-17 MED ORDER — GI COCKTAIL ~~LOC~~
30.0000 mL | Freq: Once | ORAL | Status: AC
Start: 2018-05-17 — End: 2018-05-17
  Administered 2018-05-17: 30 mL via ORAL
  Filled 2018-05-17: qty 30

## 2018-05-17 MED ORDER — HYOSCYAMINE SULFATE 0.125 MG SL SUBL
0.2500 mg | SUBLINGUAL_TABLET | Freq: Once | SUBLINGUAL | Status: AC
  Administered 2018-05-17: 0.25 mg via ORAL
  Filled 2018-05-17: qty 2

## 2018-05-17 MED ORDER — POTASSIUM CHLORIDE CRYS ER 20 MEQ PO TBCR
60.0000 meq | EXTENDED_RELEASE_TABLET | Freq: Once | ORAL | Status: DC
  Filled 2018-05-17: qty 3

## 2018-05-17 MED ORDER — ONDANSETRON HCL 4 MG/2ML IJ SOLN
4.0000 mg | Freq: Once | INTRAMUSCULAR | Status: AC
  Administered 2018-05-17: 4 mg via INTRAVENOUS
  Filled 2018-05-17: qty 2

## 2018-05-17 NOTE — ED Notes (Signed)
Pt has care plan  Under pain management clinic  Pt denies being out of meds save protonix

## 2018-05-17 NOTE — ED Notes (Signed)
Pt reports that she has run out of her Oxy ("it is only a 5, " per pt)  Missed her pain clinic appt and has no more "have to reschedule it, cause I was too sick to go" per pt  Complaining of abd "cramping" and N  Refuses po meds due to "I still feel sick"  In room coughing and appearing to try to gag- given blue bag and asked to save contents for eval Pt then out of bed to BR

## 2018-05-17 NOTE — ED Notes (Signed)
Lab in to draw

## 2018-05-17 NOTE — ED Provider Notes (Signed)
Medical screening examination/treatment/procedure(s) were conducted as a shared visit with non-physician practitioner(s) and myself.  I personally evaluated the patient during the encounter.  None   32 year old female with nausea/vomiting.  Ongoing complaint.  Suspect seizure-like activity today was actually a syncopal events.  Hypokalemia.  EKG initially with significant abnormalities.  Potassium was supplemented.  Repeat EKG is much improved.  She is he dynamically stable.  She is tolerating p.o.  She has potassium supplementation at home.  Advised her to double up for the next 3 days.  Return precautions discussed.    Virgel Manifold, MD 05/20/18 [phone]

## 2018-05-17 NOTE — ED Provider Notes (Addendum)
[facility] EMERGENCY DEPARTMENT Provider Note   CSN: [id number] Arrival date & time: 05/17/18  1329     History   Chief Complaint Chief Complaint  Patient presents with  . Seizures    HPI Jeanne Mcneil is a 32 y.o. female with a history as outlined below, most significant for history of hypokalemia and dehydration with past history of acute renal failure secondary to intermittent acute vomiting syndrome presenting with LOC which occurred prior to arrival when she was walking in her home.  Daughter and mother who witnessed event states she stopped walking, started staring, reached out for her mother and fell on the floor, hitting her head during the event.  She was unresponsive for a minute, her eyes were rolled back and when she started to respond, she had garbled speech for a few seconds before she became more alert.  They did not witness seizure like tremor.   She does have a history of dehydration with sometimes profound hypokalemia secondary to chronic abdominal pain/gastritis with nausea and vomiting episodes.  She ran out of her Protonix and states the n/v is from not having this medication. She states she spent Wednesday (2 days ago) with uncontrolled vomiting and had poor intake yesterday but today has no vomiting but is still nauseated.  She reports muscle cramping so she knows her potassium is low. She endorses moderate headache at this time, denies neck pain or other pain associated with her fall.  HPI  Past Medical History:  Diagnosis Date  . Anxiety   . Back pain   . Constipation   . Depression   . Diverticulitis 10/31/2017  . GERD (gastroesophageal reflux disease)   . Glaucoma of both eyes   . Hematuria   . History of abnormal cervical Pap smear   . History of chronic gastritis   . History of kidney stones   . Hydronephrosis, right   . Renal calculi    bilateral per ct [id number]  . Right ureteral stone   . Urgency of urination   . Uterine fibroid   . Uterine  polyp   . Vaginal Pap smear, abnormal     Patient Active Problem List   Diagnosis Date Noted  . Noncompliance by refusing service= multiple NO SHOW's 05/01/2018  . Dehydration 01/01/2018  . Lactic acidosis 01/01/2018  . SIRS (systemic inflammatory response syndrome) ([place]) 01/01/2018  . Hypercalcemia 01/01/2018  . ARF (acute renal failure) ([place]) 12/31/2017  . Gonorrhea 12/20/2017  . AKI (acute kidney injury) ([place]) 06/13/2017  . Hematuria 06/13/2017  . Hypomagnesemia 04/29/2017  . Cannabinoid hyperemesis syndrome ([place]) 04/29/2017  . Acute colitis 04/29/2017  . Abdominal pain   . Normochromic normocytic anemia   . Nausea & vomiting 04/02/2017  . Acute renal failure ([place]) 01/16/2017  . Hyponatremia 01/16/2017  . Leukocytosis 01/16/2017  . Nausea and vomiting 01/15/2017  . Chest pain 01/15/2017  . Pyelonephritis 10/11/2016  . Hypokalemia 10/11/2016  . Hyperglycemia 10/11/2016  . Elevated blood pressure reading 10/11/2016  . Left nephrolithiasis 10/11/2016  . Renal colic on left side 78/29/5621  . Generalized anxiety disorder 10/11/2016  . Nausea with vomiting 07/26/2016  . Ureteral stone 07/04/2016  . UTI (urinary tract infection) 07/04/2016  . Hydronephrosis of right kidney 07/04/2016  . GERD (gastroesophageal reflux disease) 07/04/2016  . Ureteral calculi 07/04/2016  . Abdominal pain, chronic, epigastric   . Hematemesis 05/16/2016  . Heme + stool 05/16/2016  . Abdominal pain, epigastric 05/16/2016    Past Surgical History:  Procedure Laterality Date  . CYSTOSCOPY W/ URETERAL STENT PLACEMENT Right 07/04/2016   Procedure: CYSTOSCOPY WITH RIGHT RETROGRADE PYELOGRAM, RIGHT URETERAL STENT PLACEMENT;  Surgeon: Ardis Hughs, MD;  Location: [facility];  Service: Urology;  Laterality: Right;  . CYSTOSCOPY WITH RETROGRADE PYELOGRAM, URETEROSCOPY AND STENT PLACEMENT Left 11/28/2007  . CYSTOSCOPY WITH RETROGRADE PYELOGRAM, URETEROSCOPY AND STENT PLACEMENT Right 08/02/2016    Procedure: CYSTOSCOPY WITH RIGHT  RETROGRADE PYELOGRAM, URETEROSCOPY,  AND STENT EXCHANGE;  Surgeon: Ardis Hughs, MD;  Location: [facility];  Service: Urology;  Laterality: Right;  . ESOPHAGOGASTRODUODENOSCOPY N/A 05/17/2016   Dr. Oneida Alar: LA Grade A esophagitis, mild gastritis s/p biopsy noting reactive gastritis, medium sized hiatal hernia, next EGD WITH PROPOFOL   . INDUCED ABORTION  Feb 01, 2008     OB History    Gravida  3   Para  2   Term  2   Preterm      AB  1   Living  2     SAB  1   TAB      Ectopic      Multiple      Live Births  2            Home Medications    Prior to Admission medications   Medication Sig Start Date End Date Taking? Authorizing Provider  metoCLOPramide (REGLAN) 10 MG tablet Take 10 mg by mouth every 6 (six) hours as needed for nausea. 02/08/18  Yes [provider]  norgestimate-ethinyl estradiol (ORTHO-CYCLEN,SPRINTEC,PREVIFEM) 0.25-35 MG-MCG tablet Take 1 tablet by mouth daily. 04/08/18  Yes Jonnie Kind, MD  ondansetron (ZOFRAN) 4 MG tablet Take 4 mg by mouth every 8 (eight) hours as needed for nausea or vomiting.   Yes [provider]  oxycodone (OXY-IR) 5 MG capsule Take 5 mg by mouth every 4 (four) hours as needed.   Yes [provider]  Potassium 99 MG TABS Take 1 tablet by mouth daily.   Yes [provider]  promethazine (PHENERGAN) 25 MG tablet Take 1 tablet (25 mg total) by mouth every 6 (six) hours as needed for nausea or vomiting. 07/10/17  Yes Pollina, Gwenyth Allegra, MD  silver sulfADIAZINE (SILVADENE) 1 % cream 1 APP 2 TIMES A DAY APPLIED TOPICALLY 14 DAY(S) 03/12/18  Yes [provider]  tiZANidine (ZANAFLEX) 4 MG tablet Take 4 mg by mouth every 8 (eight) hours as needed for muscle spasms.   Yes [provider]  pantoprazole (PROTONIX) 40 MG tablet Take 1 tablet (40 mg total) by mouth daily. 05/17/18   Evalee Jefferson, PA-C  potassium chloride SA  (K-DUR,KLOR-CON) 20 MEQ tablet Take 1 tablet (20 mEq total) by mouth 2 (two) times daily for 10 days. 05/17/18 05/27/18  Evalee Jefferson, PA-C    Family History Family History  Problem Relation Age of Onset  . Colon cancer Paternal Uncle        Passed away 31-Jan-2015  . Nephrolithiasis Father   . Hypertension Father   . Nephrolithiasis Sister   . Nephrolithiasis Sister   . Nephrolithiasis Sister   . Hypertension Mother   . Colon cancer Maternal Grandfather     Social History Social History   Tobacco Use  . Smoking status: Current Every Day Smoker    Packs/day: 1.00    Years: 20.00    Pack years: 20.00    Types: Cigarettes  . Smokeless tobacco: Never Used  . Tobacco comment: 5-7 cigarettes daily   Substance Use Topics  .  Alcohol use: No    Alcohol/week: 2.4 oz    Types: 4 Shots of liquor per week    Comment: states has not used in 2 years  . Drug use: Yes    Types: Marijuana, Barbituates    Comment: pot in the beginning of the month     Allergies   Nsaids; Tape; Keflex [cephalexin]; Codeine; and Vicodin [hydrocodone-acetaminophen]   Review of Systems Review of Systems  Constitutional: Negative for fever.  HENT: Negative.   Eyes: Negative.   Respiratory: Negative for chest tightness and shortness of breath.   Cardiovascular: Negative for chest pain.  Gastrointestinal: Positive for abdominal pain, nausea and vomiting. Negative for constipation and diarrhea.  Genitourinary: Negative.   Musculoskeletal: Negative for arthralgias, joint swelling and neck pain.  Skin: Negative.  Negative for rash and wound.  Neurological: Positive for syncope and headaches. Negative for dizziness, weakness, light-headedness and numbness.  Psychiatric/Behavioral: Negative.      Physical Exam Updated Vital Signs BP 119/90   Pulse 85   Temp 97.8 F (36.6 C) (Oral)   Resp (!) 22   Wt 57.2 kg (126 lb)   LMP 05/13/2018   SpO2 97%   BMI 23.05 kg/m   Physical Exam  Constitutional: She  appears well-developed and well-nourished. No distress.  HENT:  Head: Normocephalic. Head is without raccoon's eyes and without Battle's sign.  Right Ear: Tympanic membrane normal. No hemotympanum.  Left Ear: Tympanic membrane normal. No hemotympanum.  Eyes: Conjunctivae are normal.  Neck: Normal range of motion.  Cardiovascular: Normal rate, regular rhythm, normal heart sounds and intact distal pulses.  Pulmonary/Chest: Effort normal and breath sounds normal. She has no wheezes.  Abdominal: Soft. Bowel sounds are normal. She exhibits no distension and no mass. There is tenderness in the epigastric area. There is no guarding.   no guarding. Non acute abdominal exam. Soft abd.  Musculoskeletal: Normal range of motion.  Neurological: She is alert. She displays normal reflexes. No cranial nerve deficit. She exhibits normal muscle tone. Coordination normal.  Equal grip strength  Skin: Skin is warm and dry.  Psychiatric: She has a normal mood and affect.  Nursing note and vitals reviewed.    ED Treatments / Results  Labs (all labs ordered are listed, but only abnormal results are displayed) Results for orders placed or performed during the hospital encounter of 05/17/18  CBC with Differential  Result Value Ref Range   WBC 15.1 (H) 4.0 - 10.5 K/uL   RBC 4.76 3.87 - 5.11 MIL/uL   Hemoglobin 14.1 12.0 - 15.0 g/dL   HCT 41.3 36.0 - 46.0 %   MCV 86.8 78.0 - 100.0 fL   MCH 29.6 26.0 - 34.0 pg   MCHC 34.1 30.0 - 36.0 g/dL   RDW 13.8 11.5 - 15.5 %   Platelets 327 150 - 400 K/uL   Neutrophils Relative % 77 %   Neutro Abs 11.5 (H) 1.7 - 7.7 K/uL   Lymphocytes Relative 12 %   Lymphs Abs 1.8 0.7 - 4.0 K/uL   Monocytes Relative 11 %   Monocytes Absolute 1.7 (H) 0.1 - 1.0 K/uL   Eosinophils Relative 0 %   Eosinophils Absolute 0.0 0.0 - 0.7 K/uL   Basophils Relative 0 %   Basophils Absolute 0.0 0.0 - 0.1 K/uL  Comprehensive metabolic panel  Result Value Ref Range   Sodium 127 (L) 135 - 145  mmol/L   Potassium 2.6 (LL) 3.5 - 5.1 mmol/L   Chloride 83 (L)  98 - 111 mmol/L   CO2 31 22 - 32 mmol/L   Glucose, Bld 124 (H) 70 - 99 mg/dL   BUN 66 (H) 6 - 20 mg/dL   Creatinine, Ser 1.37 (H) 0.44 - 1.00 mg/dL   Calcium 8.7 (L) 8.9 - 10.3 mg/dL   Total Protein 7.6 6.5 - 8.1 g/dL   Albumin 4.2 3.5 - 5.0 g/dL   AST 27 15 - 41 U/L   ALT 19 0 - 44 U/L   Alkaline Phosphatase 41 38 - 126 U/L   Total Bilirubin 1.0 0.3 - 1.2 mg/dL   GFR calc non Af Amer 50 (L) >60 mL/min   GFR calc Af Amer 58 (L) >60 mL/min   Anion gap 13 5 - 15  Urinalysis, Routine w reflex microscopic  Result Value Ref Range   Color, Urine YELLOW YELLOW   APPearance HAZY (A) CLEAR   Specific Gravity, Urine 1.018 1.005 - 1.030   pH 5.0 5.0 - 8.0   Glucose, UA 50 (A) NEGATIVE mg/dL   Hgb urine dipstick LARGE (A) NEGATIVE   Bilirubin Urine NEGATIVE NEGATIVE   Ketones, ur NEGATIVE NEGATIVE mg/dL   Protein, ur 30 (A) NEGATIVE mg/dL   Nitrite NEGATIVE NEGATIVE   Leukocytes, UA NEGATIVE NEGATIVE   RBC / HPF 0-5 0 - 5 RBC/hpf   WBC, UA 0-5 0 - 5 WBC/hpf   Bacteria, UA RARE (A) NONE SEEN   Squamous Epithelial / LPF 0-5 0 - 5  Rapid urine drug screen (hospital performed)  Result Value Ref Range   Opiates NONE DETECTED NONE DETECTED   Cocaine NONE DETECTED NONE DETECTED   Benzodiazepines NONE DETECTED NONE DETECTED   Amphetamines NONE DETECTED NONE DETECTED   Tetrahydrocannabinol POSITIVE (A) NONE DETECTED   Barbiturates (A) NONE DETECTED    Result not available. Reagent lot number recalled by manufacturer.  Magnesium  Result Value Ref Range   Magnesium 3.1 (H) 1.7 - 2.4 mg/dL  POC urine preg, ED (not at [facility])  Result Value Ref Range   Preg Test, Ur NEGATIVE NEGATIVE   Ct Head Wo Contrast  Result Date: 05/17/2018 CLINICAL DATA:  Recent seizure activity and fall EXAM: CT HEAD WITHOUT CONTRAST TECHNIQUE: Contiguous axial images were obtained from the base of the skull through the vertex without intravenous contrast.  COMPARISON:  None. FINDINGS: Brain: No evidence of acute infarction, hemorrhage, hydrocephalus, extra-axial collection or mass lesion/mass effect. Vascular: No hyperdense vessel or unexpected calcification. Skull: Normal. Negative for fracture or focal lesion. Sinuses/Orbits: No acute finding. Other: None. IMPRESSION: Normal head CT Electronically Signed   By: Inez Catalina M.D.   On: 05/17/2018 15:37     EKG ED ECG REPORT   Date: 05/17/2018  Rate: 83  Rhythm: normal sinus rhythm  QRS Axis: normal  Intervals: QT prolonged  ST/T Wave abnormalities: normal  Conduction Disutrbances:none  Narrative Interpretation: QT prolongation new  Old EKG Reviewed: changes noted  I have personally reviewed the EKG tracing and agree with the computerized printout as noted.   Radiology Ct Head Wo Contrast  Result Date: 05/17/2018 CLINICAL DATA:  Recent seizure activity and fall EXAM: CT HEAD WITHOUT CONTRAST TECHNIQUE: Contiguous axial images were obtained from the base of the skull through the vertex without intravenous contrast. COMPARISON:  None. FINDINGS: Brain: No evidence of acute infarction, hemorrhage, hydrocephalus, extra-axial collection or mass lesion/mass effect. Vascular: No hyperdense vessel or unexpected calcification. Skull: Normal. Negative for fracture or focal lesion. Sinuses/Orbits: No acute finding. Other: None. IMPRESSION:  Normal head CT Electronically Signed   By: Inez Catalina M.D.   On: 05/17/2018 15:37    Procedures Procedures (including critical care time)  Medications Ordered in ED Medications  pantoprazole (PROTONIX) EC tablet 40 mg (40 mg Oral Refused 05/17/18 1652)  potassium chloride 10 mEq in 100 mL IVPB (10 mEq Intravenous New Bag/Given 05/17/18 1756)  sodium chloride 0.9 % bolus 1,000 mL (0 mLs Intravenous Stopped 05/17/18 1632)  potassium chloride 10 mEq in 100 mL IVPB (0 mEq Intravenous Stopped 05/17/18 1655)  potassium chloride SA (K-DUR,KLOR-CON) CR tablet 40 mEq (40  mEq Oral Given 05/17/18 1551)  ondansetron (ZOFRAN) injection 4 mg (4 mg Intravenous Given 05/17/18 1648)  famotidine (PEPCID) IVPB 20 mg premix (0 mg Intravenous Stopped 05/17/18 1746)  gi cocktail (Maalox,Lidocaine,Donnatal) (30 mLs Oral Given 05/17/18 1723)  promethazine (PHENERGAN) injection 25 mg (25 mg Intravenous Given 05/17/18 1726)  hyoscyamine (LEVSIN SL) SL tablet 0.25 mg (0.25 mg Oral Given 05/17/18 1756)     Initial Impression / Assessment and Plan / ED Course  I have reviewed the triage vital signs and the nursing notes.  Pertinent labs & imaging results that were available during my care of the patient were reviewed by me and considered in my medical decision making (see chart for details).     [place] controlled substance database reviewed. Oxycodone #45 last prescribed 5/19 by Dr. Melina Fiddler   Discussed lab results and CT imaging results with pt. Hypokalemia, hyponatremia, syncope, doubt seizure. She was given IV fluids,  IV potassium 10 meq,  also gave 40 meq PO, but was unable to keep down.  Added phenergan since zofran was not effective, re try of po K+ given. Nausea much improved also after GI cocktail given. Pt does not want to to be admitted. Advised pt will recheck K+ and Na+ after second dosing run and if better, should be able to go home.  Pt agrees with plan. Ambulatory in dept without n/v, no dizziness, VSS.   Discussed pt with Dr. Wilson Singer who will dispo home if repeat labs improved, ekg improved.  Pt undergoing PO challenge presently. Final Clinical Impressions(s) / ED Diagnoses   Final diagnoses:  Hypokalemia  Hyponatremia  Syncope, unspecified syncope type  Non-intractable cyclical vomiting with nausea  Chronic abdominal pain  Dehydration    ED Discharge Orders        Ordered    pantoprazole (PROTONIX) 40 MG tablet  Daily     05/17/18 1830    potassium chloride SA (K-DUR,KLOR-CON) 20 MEQ tablet  2 times daily     05/17/18 1830       Evalee Jefferson,  PA-C 05/17/18 1839    Evalee Jefferson, PA-C 05/17/18 1859    Virgel Manifold, MD 05/18/18 2357

## 2018-05-17 NOTE — ED Notes (Signed)
Patient ambulatory to restroom without assistance.

## 2018-05-17 NOTE — ED Notes (Signed)
Pt out of bed requests to go home   Requests sprite  Pos offereed

## 2018-05-17 NOTE — ED Notes (Signed)
Pt reports "fell out" x 2 today  Reports has a headache now   When asked if stopped meds recently  Pt reports out of Protonix and has called her physician in [place] for refill She is thin, conversant and neuro intact

## 2018-05-17 NOTE — ED Notes (Signed)
Per Id[facility] - patient is NPO UNTIL CT scan

## 2018-05-17 NOTE — Discharge Instructions (Signed)
Take the medicines as prescribed, the potassium to continue replacing what was lost with your recent episode of vomiting. Get back on your protonix daily.  Use the zofran if needed for continued nausea or vomiting.  Return here for any new or worsened symptoms. Call Dr. Gala Romney who is a gastroenterology specialist for further evaluation of these chronic symptoms.

## 2018-05-17 NOTE — ED Notes (Signed)
CRITICAL VALUE ALERT  Critical Value: kt  2.6  Date & Time Notied:  2217 05/17/18  Provider Notified: Olga Millers  Orders Received/Actions taken:

## 2018-05-17 NOTE — ED Notes (Signed)
Pt refuses K tablets stating they are too big and will make her vomit as "I know my body"  Dr Wilson Singer informed and in to see pt

## 2018-05-17 NOTE — ED Notes (Signed)
Pt asks if legs jerking due to low K

## 2018-05-17 NOTE — ED Notes (Signed)
Pt has been eval'd by PA after requesting to go home  Orders received

## 2018-05-17 NOTE — ED Notes (Signed)
Pt refused K tablets saying she knows her body and they are hard to swallow Dr Raliegh Ip informed and saw pt   Pt demanded to have her IV removed, was disinterested in her DC instructions and states she should not have had anyhing except paper tape for her IV (started by EMS)- no redness rash, swelling noted  Pt appears disinterested in her discharge instructions saying her ride was "downstairs" and she was ready to go   Pt IC DC'd and pt out of room and down hall without signing

## 2018-05-17 NOTE — ED Triage Notes (Addendum)
Pt brought in by EMS due to seizure . Family witnessed pt pt falling to ground and body locked up .Marland Kitchen Pt has a hx of chronic gastritis and has been out of protonix and has been N/V/D. Has hx of low potassium and taken potassium supplements which she states contributed to seizure on year ago

## 2018-05-27 ENCOUNTER — Ambulatory Visit: Payer: Medicaid Other | Admitting: Obstetrics and Gynecology

## 2018-06-05 ENCOUNTER — Ambulatory Visit: Payer: Medicaid Other | Admitting: Obstetrics and Gynecology

## 2018-06-06 ENCOUNTER — Telehealth: Payer: Self-pay | Admitting: *Deleted

## 2018-06-06 NOTE — Telephone Encounter (Signed)
Patient states she did not know she had an appt yesterday with Dr Glo Herring but would like to make another appt for STD screening and BCP.  Next available appt made.

## 2018-06-11 ENCOUNTER — Encounter: Payer: Self-pay | Admitting: *Deleted

## 2018-06-11 ENCOUNTER — Ambulatory Visit: Payer: Medicaid Other | Admitting: Women's Health

## 2018-06-11 ENCOUNTER — Encounter: Payer: Self-pay | Admitting: Women's Health

## 2018-06-11 VITALS — BP 106/66 | HR 85 | Ht 62.0 in | Wt 129.6 lb

## 2018-06-11 DIAGNOSIS — Z3202 Encounter for pregnancy test, result negative: Secondary | ICD-10-CM

## 2018-06-11 DIAGNOSIS — N898 Other specified noninflammatory disorders of vagina: Secondary | ICD-10-CM

## 2018-06-11 DIAGNOSIS — Z113 Encounter for screening for infections with a predominantly sexual mode of transmission: Secondary | ICD-10-CM

## 2018-06-11 DIAGNOSIS — B9689 Other specified bacterial agents as the cause of diseases classified elsewhere: Secondary | ICD-10-CM

## 2018-06-11 DIAGNOSIS — N760 Acute vaginitis: Secondary | ICD-10-CM

## 2018-06-11 LAB — POCT URINE PREGNANCY: Preg Test, Ur: NEGATIVE

## 2018-06-11 LAB — POCT WET PREP (WET MOUNT)
Clue Cells Wet Prep Whiff POC: POSITIVE
Trichomonas Wet Prep HPF POC: ABSENT

## 2018-06-11 MED ORDER — METRONIDAZOLE 0.75 % VA GEL: 1.0000 | Freq: Every day | VAGINAL | 0 refills | Status: DC

## 2018-06-11 NOTE — Progress Notes (Signed)
   GYN VISIT Patient name: Jeanne Mcneil MRN [id number]  Date of birth: 1986/02/20 Chief Complaint:   break thur bleeding (vaginal discharge)  History of Present Illness:   Jeanne Mcneil is a 32 y.o. G76P2012 Caucasian female being seen today for report of spotting and vaginal d/c w/ odor and itching x 1wk, using Monistat which is helping.  Has missed about 4 of her COCs, and has been sexually active.    Patient's last menstrual period was 05/13/2018. The current method of family planning is OCP (estrogen/progesterone). Last pap March 2019. Results were:  LSIL/HPV, didn't f/u for colpo Review of Systems:   Pertinent items are noted in HPI Denies fever/chills, dizziness, headaches, visual disturbances, fatigue, shortness of breath, chest pain, abdominal pain, vomiting, abnormal vaginal discharge/itching/odor/irritation, problems with periods, bowel movements, urination, or intercourse unless otherwise stated above.  Pertinent History Reviewed:  Reviewed past medical,surgical, social, obstetrical and family history.  Reviewed problem list, medications and allergies. Physical Assessment:   Vitals:   06/11/18 1336  BP: 106/66  Pulse: 85  Weight: 129 lb 9.6 oz (58.8 kg)  Height: 5' 2"  (1.575 m)  Body mass index is 23.7 kg/m.       Physical Examination:   General appearance: alert, well appearing, and in no distress  Mental status: alert, oriented to person, place, and time  Skin: warm & dry   Cardiovascular: normal heart rate noted  Respiratory: normal respiratory effort, no distress  Abdomen: soft, non-tender   Pelvic: VULVA: normal appearing vulva with no masses, tenderness or lesions, VAGINA: normal appearing vagina with normal color and thin white malodorous discharge, no lesions, CERVIX: normal appearing cervix without discharge or lesions  Extremities: no edema   Results for orders placed or performed in visit on 06/11/18 (from the past 24 hour(s))  POCT Wet Prep [facility])    Collection Time: 06/11/18  2:05 PM  Result Value Ref Range   Source Wet Prep POC vaginal    WBC, Wet Prep HPF POC few    Bacteria Wet Prep HPF POC Few Few   BACTERIA WET PREP MORPHOLOGY POC     Clue Cells Wet Prep HPF POC Many (A) None   Clue Cells Wet Prep Whiff POC Positive Whiff    Yeast Wet Prep HPF POC None None   KOH Wet Prep POC  None   Trichomonas Wet Prep HPF POC Absent Absent  POCT urine pregnancy   Collection Time: 06/11/18  2:07 PM  Result Value Ref Range   Preg Test, Ur Negative Negative    Assessment & Plan:  1) BV> rx metrogel q hs x 5night per her preference, no sex or etoh while using, will send gc/ct  2) Abnormal pap> didn't f/u for colpo, will schedule  Meds:  Meds ordered this encounter  Medications  . metroNIDAZOLE (METROGEL VAGINAL) 0.75 % vaginal gel    Sig: Place 1 Applicatorful vaginally at bedtime. X 5 nights, no alcohol or sex while using    Dispense:  70 g    Refill:  0    Order Specific Question:   Supervising Provider    Answer:   Tania Ade H [[id number]]    Orders Placed This Encounter  Procedures  . GC/Chlamydia Probe Amp  . POCT Wet Prep [employer])  . POCT urine pregnancy    Return for schedule colpo w/ JVF.  [place], [facility] 06/11/2018 2:28 PM

## 2018-06-11 NOTE — Patient Instructions (Signed)
Bacterial Vaginosis Bacterial vaginosis is a vaginal infection that occurs when the normal balance of bacteria in the vagina is disrupted. It results from an overgrowth of certain bacteria. This is the most common vaginal infection among women ages 15-44. Because bacterial vaginosis increases your risk for STIs (sexually transmitted infections), getting treated can help reduce your risk for chlamydia, gonorrhea, herpes, and HIV (human immunodeficiency virus). Treatment is also important for preventing complications in pregnant women, because this condition can cause an early (premature) delivery. What are the causes? This condition is caused by an increase in harmful bacteria that are normally present in small amounts in the vagina. However, the reason that the condition develops is not fully understood. What increases the risk? The following factors may make you more likely to develop this condition:  Having a new sexual partner or multiple sexual partners.  Having unprotected sex.  Douching.  Having an intrauterine device (IUD).  Smoking.  Drug and alcohol abuse.  Taking certain antibiotic medicines.  Being pregnant.  You cannot get bacterial vaginosis from toilet seats, bedding, swimming pools, or contact with objects around you. What are the signs or symptoms? Symptoms of this condition include:  Grey or white vaginal discharge. The discharge can also be watery or foamy.  A fish-like odor with discharge, especially after sexual intercourse or during menstruation.  Itching in and around the vagina.  Burning or pain with urination.  Some women with bacterial vaginosis have no signs or symptoms. How is this diagnosed? This condition is diagnosed based on:  Your medical history.  A physical exam of the vagina.  Testing a sample of vaginal fluid under a microscope to look for a large amount of bad bacteria or abnormal cells. Your health care provider may use a cotton swab  or a small wooden spatula to collect the sample.  How is this treated? This condition is treated with antibiotics. These may be given as a pill, a vaginal cream, or a medicine that is put into the vagina (suppository). If the condition comes back after treatment, a second round of antibiotics may be needed. Follow these instructions at home: Medicines  Take over-the-counter and prescription medicines only as told by your health care provider.  Take or use your antibiotic as told by your health care provider. Do not stop taking or using the antibiotic even if you start to feel better. General instructions  If you have a female sexual partner, tell her that you have a vaginal infection. She should see her health care provider and be treated if she has symptoms. If you have a female sexual partner, he does not need treatment.  During treatment: ? Avoid sexual activity until you finish treatment. ? Do not douche. ? Avoid alcohol as directed by your health care provider. ? Avoid breastfeeding as directed by your health care provider.  Drink enough water and fluids to keep your urine clear or pale yellow.  Keep the area around your vagina and rectum clean. ? Wash the area daily with warm water. ? Wipe yourself from front to back after using the toilet.  Keep all follow-up visits as told by your health care provider. This is important. How is this prevented?  Do not douche.  Wash the outside of your vagina with warm water only.  Use protection when having sex. This includes latex condoms and dental dams.  Limit how many sexual partners you have. To help prevent bacterial vaginosis, it is best to have sex with just   one partner (monogamous).  Make sure you and your sexual partner are tested for STIs.  Wear cotton or cotton-lined underwear.  Avoid wearing tight pants and pantyhose, especially during summer.  Limit the amount of alcohol that you drink.  Do not use any products that  contain nicotine or tobacco, such as cigarettes and e-cigarettes. If you need help quitting, ask your health care provider.  Do not use illegal drugs. Where to find more information:  Centers for Disease Control and Prevention: www.cdc.gov/std  American Sexual Health Association (ASHA): www.ashastd.org  U.S. Department of Health and Human Services, Office on Women's Health: www.womenshealth.gov/ or https://www.womenshealth.gov/a-z-topics/bacterial-vaginosis Contact a health care provider if:  Your symptoms do not improve, even after treatment.  You have more discharge or pain when urinating.  You have a fever.  You have pain in your abdomen.  You have pain during sex.  You have vaginal bleeding between periods. Summary  Bacterial vaginosis is a vaginal infection that occurs when the normal balance of bacteria in the vagina is disrupted.  Because bacterial vaginosis increases your risk for STIs (sexually transmitted infections), getting treated can help reduce your risk for chlamydia, gonorrhea, herpes, and HIV (human immunodeficiency virus). Treatment is also important for preventing complications in pregnant women, because the condition can cause an early (premature) delivery.  This condition is treated with antibiotic medicines. These may be given as a pill, a vaginal cream, or a medicine that is put into the vagina (suppository). This information is not intended to replace advice given to you by your health care provider. Make sure you discuss any questions you have with your health care provider. Document Released: 11/06/2005 Document Revised: 03/12/2017 Document Reviewed: 07/22/2016 Elsevier Interactive Patient Education  2018 Elsevier Inc.  

## 2018-06-13 LAB — GC/CHLAMYDIA PROBE AMP
Chlamydia trachomatis, NAA: NEGATIVE
NEISSERIA GONORRHOEAE BY PCR: NEGATIVE

## 2018-06-19 NOTE — Progress Notes (Signed)
NO show

## 2018-06-20 ENCOUNTER — Encounter: Payer: Medicaid Other | Admitting: Obstetrics and Gynecology

## 2018-07-02 ENCOUNTER — Encounter: Payer: Medicaid Other | Admitting: Obstetrics & Gynecology

## 2018-08-14 ENCOUNTER — Ambulatory Visit: Payer: Medicaid Other | Admitting: Adult Health

## 2018-08-20 ENCOUNTER — Ambulatory Visit: Payer: Medicaid Other | Admitting: Adult Health

## 2018-09-02 ENCOUNTER — Encounter: Payer: Self-pay | Admitting: Adult Health

## 2018-09-02 ENCOUNTER — Ambulatory Visit (INDEPENDENT_AMBULATORY_CARE_PROVIDER_SITE_OTHER): Payer: Medicaid Other | Admitting: Adult Health

## 2018-09-02 ENCOUNTER — Encounter (INDEPENDENT_AMBULATORY_CARE_PROVIDER_SITE_OTHER): Payer: Self-pay

## 2018-09-02 VITALS — BP 130/80 | HR 86 | Ht 62.0 in | Wt 138.5 lb

## 2018-09-02 DIAGNOSIS — Z113 Encounter for screening for infections with a predominantly sexual mode of transmission: Secondary | ICD-10-CM | POA: Diagnosis not present

## 2018-09-02 DIAGNOSIS — Z3202 Encounter for pregnancy test, result negative: Secondary | ICD-10-CM | POA: Diagnosis not present

## 2018-09-02 LAB — POCT URINE PREGNANCY: Preg Test, Ur: NEGATIVE

## 2018-09-02 NOTE — Progress Notes (Signed)
  Subjective:     Patient ID: Jeanne Mcneil, female   DOB: Apr 03, 1986, 32 y.o.   MRN: [id number]  HPI Jeanne Mcneil is a 32 year old white female, requesting STD testing and UPT. She said her boyfriend may have had STD contact, but he denies it.   Review of Systems Patient denies any headaches, hearing loss, fatigue, blurred vision, shortness of breath, chest pain, abdominal pain, problems with bowel movements, urination, or intercourse. No joint pain or mood swings. Reviewed past medical,surgical, social and family history. Reviewed medications and allergies.     Objective:   Physical Exam BP 130/80 (BP Location: Left Arm, Patient Position: Sitting, Cuff Size: Normal)   Pulse 86   Ht 5' 2"  (1.575 m)   Wt 138 lb 8 oz (62.8 kg)   BMI 25.33 kg/m    UPT negative. Skin warm and dry.Pelvic: external genitalia is normal in appearance no lesions, vagina: scant discharge without odor,urethra has no lesions or masses noted, cervix:smooth and bulbous, uterus: normal size, shape and contour, non tender, no masses felt, adnexa: no masses or tenderness noted. Bladder is non tender and no masses felt. Nuswab obtained.   Assessment:     1. Screening examination for STD (sexually transmitted disease)   2. Pregnancy examination or test, negative result       Plan:     Nuswab sent Check HIV and RPR Use condoms, too Follow up prn

## 2018-09-03 DIAGNOSIS — Z113 Encounter for screening for infections with a predominantly sexual mode of transmission: Secondary | ICD-10-CM | POA: Diagnosis not present

## 2018-09-05 ENCOUNTER — Telehealth: Payer: Self-pay | Admitting: Adult Health

## 2018-09-05 ENCOUNTER — Telehealth: Payer: Self-pay | Admitting: *Deleted

## 2018-09-05 ENCOUNTER — Encounter: Payer: Self-pay | Admitting: Adult Health

## 2018-09-05 DIAGNOSIS — A599 Trichomoniasis, unspecified: Secondary | ICD-10-CM | POA: Insufficient documentation

## 2018-09-05 HISTORY — DX: Trichomoniasis, unspecified: A59.9

## 2018-09-05 LAB — NUSWAB VAGINITIS PLUS (VG+)
Candida albicans, NAA: NEGATIVE
Candida glabrata, NAA: NEGATIVE
Chlamydia trachomatis, NAA: NEGATIVE
Neisseria gonorrhoeae, NAA: NEGATIVE
Trich vag by NAA: POSITIVE — AB

## 2018-09-05 MED ORDER — PROMETHAZINE HCL 25 MG PO TABS: 25.0000 mg | ORAL_TABLET | Freq: Four times a day (QID) | ORAL | 1 refills | Status: DC | PRN

## 2018-09-05 MED ORDER — METRONIDAZOLE 500 MG PO TABS: 500.0000 mg | ORAL_TABLET | Freq: Two times a day (BID) | ORAL | 0 refills | Status: DC

## 2018-09-05 NOTE — Addendum Note (Signed)
Addended by: Derrek Monaco A on: 09/05/2018 01:46 PM   Modules accepted: Orders

## 2018-09-05 NOTE — Telephone Encounter (Signed)
Pt requests phenergan for nausea, has use in past

## 2018-09-05 NOTE — Telephone Encounter (Signed)
Patient called requesting nausea medication.  She is not pregnant but states she has issues with nausea all the time.  She is asking for something to be sent in before 1pm. Please advise.

## 2018-09-05 NOTE — Telephone Encounter (Signed)
Pt aware Nuswab +trich, rx flagyl 500 mg 1 bid x 7days, no sex or alcohol , for POC 10/29 at 10:30 and , tell partner to get treated, he is in [place] now.

## 2018-09-17 ENCOUNTER — Ambulatory Visit: Payer: Medicaid Other | Admitting: Adult Health

## 2018-09-24 ENCOUNTER — Telehealth: Payer: Self-pay | Admitting: Adult Health

## 2018-09-24 ENCOUNTER — Ambulatory Visit: Payer: Medicaid Other | Admitting: Adult Health

## 2018-09-24 NOTE — Telephone Encounter (Signed)
Patient called stating that she has an appointment today at 12 pm but she would like a call from the nurse regarding the results of her blood work. Please contact pt

## 2018-09-24 NOTE — Telephone Encounter (Signed)
Left message @ 10:48 am. JSY

## 2018-09-25 ENCOUNTER — Ambulatory Visit (INDEPENDENT_AMBULATORY_CARE_PROVIDER_SITE_OTHER): Payer: Medicaid Other | Admitting: Adult Health

## 2018-09-25 ENCOUNTER — Encounter: Payer: Self-pay | Admitting: Adult Health

## 2018-09-25 VITALS — BP 135/88 | HR 74 | Ht 62.0 in | Wt 141.0 lb

## 2018-09-25 DIAGNOSIS — N946 Dysmenorrhea, unspecified: Secondary | ICD-10-CM | POA: Diagnosis not present

## 2018-09-25 DIAGNOSIS — Z8619 Personal history of other infectious and parasitic diseases: Secondary | ICD-10-CM

## 2018-09-25 DIAGNOSIS — Z09 Encounter for follow-up examination after completed treatment for conditions other than malignant neoplasm: Secondary | ICD-10-CM

## 2018-09-25 LAB — POCT WET PREP (WET MOUNT): TRICHOMONAS WET PREP HPF POC: ABSENT

## 2018-09-25 MED ORDER — PRENATAL PLUS 27-1 MG PO TABS: 1.0000 | ORAL_TABLET | Freq: Every day | ORAL | 12 refills | Status: DC

## 2018-09-25 NOTE — Telephone Encounter (Signed)
Pt was seen in office on 09/25/18. Encounter closed. [place]

## 2018-09-25 NOTE — Progress Notes (Signed)
  Subjective:     Patient ID: Jeanne Mcneil, female   DOB: [birth year], 32 y.o.   MRN: [id number]  HPI Jeanne Mcneil is a 32 year old white female in for proof of cure for recent trich.Has period cramps.She is thinking of wanting to get pregnant.   Review of Systems +periods cramps Reviewed past medical,surgical, social and family history. Reviewed medications and allergies.     Objective:   Physical Exam BP 135/88 (BP Location: Left Arm, Patient Position: Sitting, Cuff Size: Normal)   Pulse 74   Ht 5' 2"  (1.575 m)   Wt 141 lb (64 kg)   LMP 09/22/2018   BMI 25.79 kg/m   Skin warm and dry.Pelvic: external genitalia is normal in appearance no lesions, vagina: period blood, no odor,urethra has no lesions or masses noted, cervix:smooth and bulbous, uterus: normal size, shape and contour, non tender, no masses felt, adnexa: no masses or tenderness noted. Bladder is non tender and no masses felt. Wet prep: no trich seen. When ready can to get pregnant, stop OCs, and take PNV and decrease smoking.      Assessment:     1. History of trichomoniasis   2. Menstrual cramps       Plan:     Meds ordered this encounter  Medications  . prenatal vitamin w/FE, FA (PRENATAL 1 + 1) 27-1 MG TABS tablet    Sig: Take 1 tablet by mouth daily at 12 noon.    Dispense:  30 each    Refill:  12    Order Specific Question:   Supervising Provider    Answer:   Tania Ade H [[id number]]   F/U with prn

## 2018-10-30 ENCOUNTER — Telehealth: Payer: Self-pay | Admitting: *Deleted

## 2018-10-30 NOTE — Telephone Encounter (Signed)
Called patient back and left message that I am returning her call.

## 2018-12-10 ENCOUNTER — Ambulatory Visit: Payer: Medicaid Other | Admitting: Adult Health

## 2018-12-10 ENCOUNTER — Encounter: Payer: Self-pay | Admitting: Adult Health

## 2018-12-10 VITALS — BP 130/80 | HR 93 | Ht 62.5 in | Wt 149.5 lb

## 2018-12-10 DIAGNOSIS — R635 Abnormal weight gain: Secondary | ICD-10-CM

## 2018-12-10 DIAGNOSIS — N926 Irregular menstruation, unspecified: Secondary | ICD-10-CM

## 2018-12-10 DIAGNOSIS — Z3202 Encounter for pregnancy test, result negative: Secondary | ICD-10-CM

## 2018-12-10 LAB — POCT URINE PREGNANCY: PREG TEST UR: NEGATIVE

## 2018-12-10 NOTE — Progress Notes (Addendum)
Patient ID: Jeanne Mcneil, female   DOB: Oct 28, 1986, 33 y.o.   MRN: [id number] History of Present Illness: Jeanne Mcneil is a 33 year old white female, in for UPT, has missed a period and has gained weight.She would like to be pregnant.  PCP is Jeanne Number PA.  Current Medications, Allergies, Past Medical History, Past Surgical History, Family History and Social History were reviewed in [employer] record.     Review of Systems:  Missed period +weight gain   Physical Exam:BP 130/80 (BP Location: Left Arm, Patient Position: Sitting, Cuff Size: Normal)   Pulse 93   Ht 5' 2.5" (1.588 m)   Wt 149 lb 8 oz (67.8 kg)   LMP 11/02/2018 (Approximate)   BMI 26.91 kg/m   UPT negative. General:  Well developed, well nourished, no acute distress Skin:  Warm and dry Lungs; Clear to auscultation bilaterally Cardiovascular: Regular rate and rhythm Psych:  No mood changes, alert and cooperative,seems happy Has gained about 8.5 lbs since October. Will check QHCG and TSH, start decreasing smoking.   Impression:  1. Missed period   2. Weight gain   3. Pregnancy examination or test, negative result      Plan: Check QHCG and TSH Will talk when results back F/U prn

## 2018-12-11 ENCOUNTER — Telehealth: Payer: Self-pay | Admitting: Adult Health

## 2018-12-11 LAB — BETA HCG QUANT (REF LAB): hCG Quant: <1 m[IU]/mL

## 2018-12-11 LAB — TSH: TSH: 1.44 u[IU]/mL (ref 0.450–4.500)

## 2018-12-11 NOTE — Telephone Encounter (Signed)
Pt aware quant and TSH was normal. Pt wonders why her period is late since these tests were negative. Please advise. Thanks!! [place]

## 2018-12-11 NOTE — Telephone Encounter (Signed)
Pt aware of labs, and probably did not ovulate, if no period by march make appt

## 2018-12-11 NOTE — Telephone Encounter (Signed)
Patient called, would like lab results.  [phone]

## 2019-01-03 ENCOUNTER — Telehealth: Payer: Self-pay | Admitting: *Deleted

## 2019-01-03 ENCOUNTER — Telehealth: Payer: Self-pay | Admitting: Adult Health

## 2019-01-03 NOTE — Telephone Encounter (Signed)
2nd attempt to return pts call. Number provided says not in service [phone]

## 2019-01-03 NOTE — Telephone Encounter (Signed)
Patient called stating that she would like for [facility] to call her in a refill of nausea medication. Please contact pt when done.

## 2019-01-03 NOTE — Telephone Encounter (Signed)
Left message returning pt.'s call.

## 2019-01-20 ENCOUNTER — Other Ambulatory Visit: Payer: Self-pay | Admitting: Obstetrics & Gynecology

## 2019-02-03 ENCOUNTER — Emergency Department (HOSPITAL_COMMUNITY)
Admission: EM | Admit: 2019-02-03 | Discharge: 2019-02-03 | Discharge status: Against Medical Advice | Payer: Medicaid Other

## 2019-02-03 DIAGNOSIS — R52 Pain, unspecified: Secondary | ICD-10-CM | POA: Diagnosis not present

## 2019-02-03 DIAGNOSIS — I10 Essential (primary) hypertension: Secondary | ICD-10-CM | POA: Diagnosis not present

## 2019-02-03 DIAGNOSIS — R112 Nausea with vomiting, unspecified: Secondary | ICD-10-CM | POA: Diagnosis not present

## 2019-02-03 DIAGNOSIS — R1084 Generalized abdominal pain: Secondary | ICD-10-CM | POA: Diagnosis not present

## 2019-02-03 DIAGNOSIS — E860 Dehydration: Secondary | ICD-10-CM | POA: Diagnosis not present

## 2019-02-03 NOTE — ED Triage Notes (Signed)
Pt came in and demanded "I need fluids and a GI cocktail right fucking now."  Advised pt I needed to get her triaged and the physician would assess her and determine what she needed.  Pt states if she cannot be seen immediately she is going to leave.  Refused to have vitals checked and is calling her ride.

## 2019-02-05 ENCOUNTER — Ambulatory Visit: Payer: Medicaid Other | Admitting: Obstetrics and Gynecology

## 2019-02-05 ENCOUNTER — Telehealth: Payer: Self-pay | Admitting: Adult Health

## 2019-02-05 NOTE — Telephone Encounter (Signed)
Left message @ 10:53 am. JSY

## 2019-02-05 NOTE — Telephone Encounter (Signed)
Patient called stating that she needs a refill of a certain medication that helps her with her endometriosis and she uses [facility]

## 2019-02-05 NOTE — Telephone Encounter (Signed)
Left message to call me to clarify what she is needing

## 2019-02-05 NOTE — Telephone Encounter (Signed)
Pt states she is also going back on continuous birth control. Not trying to conceive now. [place]

## 2019-02-05 NOTE — Telephone Encounter (Signed)
n

## 2019-02-05 NOTE — Telephone Encounter (Signed)
Pt says stomach burning, has nausea, bad cramps, on period, went to ER and left due to wait, asking for meds, told her has to have appt. To come in at 2 pm to be seen or can go back to ER

## 2019-02-05 NOTE — Telephone Encounter (Signed)
Spoke with pt. Pt is requesting a GI cocktail to numb the stomach, Protonix and Phenergan 25 mg. This is for endometriosis.

## 2019-02-07 ENCOUNTER — Encounter (HOSPITAL_COMMUNITY): Payer: Self-pay

## 2019-02-07 ENCOUNTER — Other Ambulatory Visit: Payer: Self-pay

## 2019-02-07 ENCOUNTER — Emergency Department (HOSPITAL_COMMUNITY)
Admission: EM | Admit: 2019-02-07 | Discharge: 2019-02-07 | Disposition: A | Discharge status: Home / Self Care | Payer: Medicaid Other | Attending: Emergency Medicine | Admitting: Emergency Medicine

## 2019-02-07 ENCOUNTER — Telehealth: Payer: Self-pay | Admitting: Obstetrics & Gynecology

## 2019-02-07 ENCOUNTER — Other Ambulatory Visit: Payer: Self-pay | Admitting: Obstetrics & Gynecology

## 2019-02-07 DIAGNOSIS — R109 Unspecified abdominal pain: Secondary | ICD-10-CM | POA: Insufficient documentation

## 2019-02-07 DIAGNOSIS — Z5321 Procedure and treatment not carried out due to patient leaving prior to being seen by health care provider: Secondary | ICD-10-CM | POA: Diagnosis not present

## 2019-02-07 DIAGNOSIS — R112 Nausea with vomiting, unspecified: Secondary | ICD-10-CM | POA: Diagnosis not present

## 2019-02-07 LAB — URINALYSIS, ROUTINE W REFLEX MICROSCOPIC
BILIRUBIN URINE: NEGATIVE
Glucose, UA: NEGATIVE mg/dL
KETONES UR: NEGATIVE mg/dL
LEUKOCYTE UA: NEGATIVE
Nitrite: NEGATIVE
PROTEIN: NEGATIVE mg/dL
SPECIFIC GRAVITY, URINE: 1.028 (ref 1.005–1.030)
pH: 5 (ref 5.0–8.0)

## 2019-02-07 MED ORDER — SODIUM CHLORIDE 0.9% FLUSH: 3.0000 mL | Freq: Once | INTRAVENOUS | Status: DC

## 2019-02-07 MED ORDER — LIDOCAINE VISCOUS HCL 2 % MT SOLN: 15.0000 mL | OROMUCOSAL | 2 refills | Status: DC | PRN

## 2019-02-07 NOTE — ED Notes (Signed)
PT LWAT at this time. PT visualized leaving the ED in vehicle at this time.

## 2019-02-07 NOTE — Telephone Encounter (Signed)
Pt said Danise Mina will not fill her meds and she can not survive if someone does not refill her meds.  Anderson Malta asked me to send this message to Dr Elonda Husky pt was worked in for an appointment the other day to get the refill Anderson Malta told her she needed to see her first and she no showed for her appt. Please call pt and advise

## 2019-02-07 NOTE — Telephone Encounter (Signed)
Called patient back and informed her that Dr. Elonda Husky had refilled the Protonix this morning. She asked if he can also send in a GI Cocktail. She is having severe reflux and has blisters in her mouth from the acid. Advised patient I would send message to him.

## 2019-02-07 NOTE — Telephone Encounter (Signed)
GI cocktails don't exist as an outpatient  I sent in a script for viscous lidocaine

## 2019-02-07 NOTE — ED Triage Notes (Signed)
Pt states she is having really bad GI problems related to endometriosis. Pt reports N/V and constipation for 9 days.

## 2019-02-07 NOTE — Telephone Encounter (Signed)
Called patient back and her daughter answered and advised that patient was at Emergency Room. Informed her of rx at pharmacy.

## 2019-02-11 DIAGNOSIS — Z029 Encounter for administrative examinations, unspecified: Secondary | ICD-10-CM

## 2019-02-26 ENCOUNTER — Telehealth: Payer: Self-pay | Admitting: Adult Health

## 2019-02-26 NOTE — Telephone Encounter (Signed)
Pt would like a call to discuss her endometriosis and for the nurse to explain to her partner what that is and what her other issues are as well.

## 2019-02-27 ENCOUNTER — Ambulatory Visit (INDEPENDENT_AMBULATORY_CARE_PROVIDER_SITE_OTHER): Payer: Medicaid Other | Admitting: Obstetrics and Gynecology

## 2019-02-27 ENCOUNTER — Other Ambulatory Visit: Payer: Self-pay

## 2019-02-27 ENCOUNTER — Encounter: Payer: Self-pay | Admitting: Obstetrics and Gynecology

## 2019-02-27 DIAGNOSIS — Z8742 Personal history of other diseases of the female genital tract: Secondary | ICD-10-CM

## 2019-02-27 NOTE — Progress Notes (Signed)
Patient ID: Jeanne Mcneil, female   DOB: 07-19-1986, 33 y.o.   MRN: [id number]    TELEHEALTH VIRTUAL GYNECOLOGY VISIT ENCOUNTER NOTE  I connected with Jeanne Mcneil on 02/27/2019 at 12:00 PM EDT by telephone at home and verified that I am speaking with the correct person using two identifiers.   I discussed the limitations, risks, security and privacy concerns of performing an evaluation and management service by telephone and the availability of in person appointments. I also discussed with the patient that there may be a patient responsible charge related to this service. The patient expressed understanding and agreed to proceed.   History:  Jeanne Mcneil is a 33 y.o. [phone] female being evaluated today for discussion of endometriosis concerns with partner and [facility] for endometriosis. Has questions of if she can still have children. She has still been using her birthcontrol consistently in an attempt to control pain which is being attributed to endometriosis  discussed the risk of bleeding, pain and ovulation when trying to get pregnant and dealing with endometriosis.  She denies any abnormal vaginal discharge, bleeding, pelvic pain or other concerns.    Caryl Pina is on the phone with her partner who has many basic questions about endometriosis, asking her birth control pills were helpful, and his questions were answered as well   Past Medical History:  Diagnosis Date  . Anxiety   . Back pain   . Constipation   . Depression   . Diverticulitis 10/31/2017  . GERD (gastroesophageal reflux disease)   . Glaucoma of both eyes   . Hematuria   . History of abnormal cervical Pap smear   . History of chronic gastritis   . History of kidney stones   . Hydronephrosis, right   . Renal calculi    bilateral per ct [id number]  . Right ureteral stone   . Trichimoniasis 09/05/2018  . Urgency of urination   . Uterine fibroid   . Uterine polyp   . Vaginal Pap smear, abnormal    Past Surgical  History:  Procedure Laterality Date  . CYSTOSCOPY W/ URETERAL STENT PLACEMENT Right 07/04/2016   Procedure: CYSTOSCOPY WITH RIGHT RETROGRADE PYELOGRAM, RIGHT URETERAL STENT PLACEMENT;  Surgeon: Ardis Hughs, MD;  Location: [facility];  Service: Urology;  Laterality: Right;  . CYSTOSCOPY WITH RETROGRADE PYELOGRAM, URETEROSCOPY AND STENT PLACEMENT Left 11/28/2007  . CYSTOSCOPY WITH RETROGRADE PYELOGRAM, URETEROSCOPY AND STENT PLACEMENT Right 08/02/2016   Procedure: CYSTOSCOPY WITH RIGHT  RETROGRADE PYELOGRAM, URETEROSCOPY,  AND STENT EXCHANGE;  Surgeon: Ardis Hughs, MD;  Location: [facility];  Service: Urology;  Laterality: Right;  . ESOPHAGOGASTRODUODENOSCOPY N/A 05/17/2016   Dr. Oneida Alar: LA Grade A esophagitis, mild gastritis s/p biopsy noting reactive gastritis, medium sized hiatal hernia, next EGD WITH PROPOFOL   . INDUCED ABORTION  2009   The following portions of the patient's history were reviewed and updated as appropriate: allergies, current medications, past family history, past medical history, past social history, past surgical history and problem list.   Health Maintenance:  Abnormal pap (LSIL) and postive HRHPV on 01/18/2018.    Review of Systems:  Pertinent items noted in HPI and remainder of comprehensive ROS otherwise negative.  Physical Exam:   General:  Alert, oriented and cooperative.   Mental Status: Normal mood and affect perceived. Normal judgment and thought content.  Physical exam deferred due to nature of the encounter  Labs and Imaging No results found for this or any previous visit (from the past  336 hour(s)). No results found.    Assessment and Plan:     A:  Hx of endometriosis, not yet diagnosed by laparoscopy w/ discussion of treatment options. History of gonorrhea 2018 history of trichomonas 2018 with several negative proof of cure's in 2019 History of trichomoniasis 2018, treated      P: Patient to be considered for laparoscopy  June by appointment w/ [facility] for April, discontinue May 1 Pre-op in June Prior STI screening for fertility work   I discussed the assessment and treatment plan with the patient. The patient was provided an opportunity to ask questions and all were answered. The patient agreed with the plan and demonstrated an understanding of the instructions.   The patient was advised to call back or seek an in-person evaluation/go to the ED if the symptoms worsen or if the condition fails to improve as anticipated.  I provided 20 minutes of non-face-to-face time during this encounter.   By signing my name below, I, Samul Dada, attest that this documentation has been prepared under the direction and in the presence of Jonnie Kind, MD. Electronically Signed: [place]. 02/27/19. 12:18 PM.  I personally performed the services described in this documentation, which was SCRIBED in my presence. The recorded information has been reviewed and considered accurate. It has been edited as necessary during review. Jonnie Kind, MD

## 2019-02-27 NOTE — Telephone Encounter (Signed)
Called patient and advised her that we will schedule tele visit with Dr. Glo Herring.

## 2019-03-16 ENCOUNTER — Encounter: Payer: Self-pay | Admitting: Obstetrics and Gynecology

## 2019-03-16 ENCOUNTER — Encounter: Payer: Self-pay | Admitting: Obstetrics & Gynecology

## 2019-03-16 ENCOUNTER — Encounter: Payer: Self-pay | Admitting: Adult Health

## 2019-03-18 ENCOUNTER — Other Ambulatory Visit: Payer: Self-pay | Admitting: Obstetrics and Gynecology

## 2019-03-19 NOTE — Telephone Encounter (Signed)
Refilled OCP x 6 months.

## 2019-05-01 ENCOUNTER — Telehealth: Payer: Self-pay | Admitting: Obstetrics and Gynecology

## 2019-05-01 NOTE — Telephone Encounter (Signed)
Patient called, requesting a refill on Ritalin, Phenergan and Protoix.  [place]  [phone]

## 2019-05-02 ENCOUNTER — Other Ambulatory Visit: Payer: Self-pay | Admitting: Obstetrics and Gynecology

## 2019-05-02 MED ORDER — PROMETHAZINE HCL 12.5 MG PO TABS: 12.5000 mg | ORAL_TABLET | Freq: Four times a day (QID) | ORAL | 1 refills | Status: DC | PRN

## 2019-05-02 MED ORDER — PANTOPRAZOLE SODIUM 40 MG PO TBEC: DELAYED_RELEASE_TABLET | ORAL | 3 refills | Status: DC

## 2019-05-02 NOTE — Progress Notes (Signed)
Pt given protonix (pantoprazole) Rx refil x 3, as well as 12.5 mg tabs for phenergan.  This office does not manage her requested Ritalin, so will not address this request.

## 2019-05-13 ENCOUNTER — Telehealth: Payer: Self-pay | Admitting: Obstetrics and Gynecology

## 2019-05-13 ENCOUNTER — Telehealth: Payer: Self-pay | Admitting: *Deleted

## 2019-05-13 NOTE — Telephone Encounter (Signed)
patient called stating that she would like a phone call from the nurse, pt states that she is having some issues with her body. Please contact pt

## 2019-05-13 NOTE — Telephone Encounter (Signed)
Patient states she has had some bleeding for 24 days now and she is 10 days past her cycle. She has not taken a pregnancy test.  States Dr Glo Herring was going to set her up for surgery at [facility].  Advised patient to take a UPT and then we would go from there.  Verbalized understanding.

## 2019-05-13 NOTE — Telephone Encounter (Signed)
Pt left message about some bleeding she was having for 24 days now 10 days past cycle/ also said Ferg was going to set her up for surgery at [facility] and has not heard from him .

## 2019-05-14 ENCOUNTER — Telehealth: Payer: Self-pay | Admitting: Obstetrics and Gynecology

## 2019-05-14 ENCOUNTER — Telehealth: Payer: Self-pay | Admitting: *Deleted

## 2019-05-14 NOTE — Telephone Encounter (Signed)
Patient called stating that she was told by Tish yesterday to give our office call when she has taken a pregnancy test and other things. Pt would like a call back to see what is the next step. Please contact pt

## 2019-05-14 NOTE — Telephone Encounter (Signed)
Patient states she has taken a pregnancy test that is negative. She states she is bleeding heavily and having intense abdominal pain.  Wants to be seen to get surgery scheduled.  Advised to be here tomorrow at 10:15.  Verbalized understanding.

## 2019-05-15 ENCOUNTER — Ambulatory Visit (INDEPENDENT_AMBULATORY_CARE_PROVIDER_SITE_OTHER): Payer: Medicaid Other | Admitting: Obstetrics and Gynecology

## 2019-05-15 ENCOUNTER — Other Ambulatory Visit: Payer: Self-pay

## 2019-05-15 ENCOUNTER — Encounter: Payer: Self-pay | Admitting: Obstetrics and Gynecology

## 2019-05-15 VITALS — BP 129/89 | HR 96 | Ht 62.0 in | Wt 151.0 lb

## 2019-05-15 DIAGNOSIS — N921 Excessive and frequent menstruation with irregular cycle: Secondary | ICD-10-CM

## 2019-05-15 MED ORDER — MEGESTROL ACETATE 40 MG PO TABS: 40.0000 mg | ORAL_TABLET | Freq: Three times a day (TID) | ORAL | 2 refills | Status: DC

## 2019-05-15 MED ORDER — BACLOFEN 20 MG PO TABS: 20.0000 mg | ORAL_TABLET | Freq: Three times a day (TID) | ORAL | 2 refills | Status: DC

## 2019-05-15 MED ORDER — METOCLOPRAMIDE HCL 10 MG PO TABS: 10.0000 mg | ORAL_TABLET | Freq: Four times a day (QID) | ORAL | 2 refills | Status: DC | PRN

## 2019-05-15 NOTE — Progress Notes (Signed)
Patient ID: [name], female   DOB: [birth year], 33 y.o.   MRN: [id number]    [facility] Visit  @DATE @            Patient name: Jeanne Mcneil MRN [id number]  Date of birth: 16-Oct-1986  CC & HPI:  Jeanne Mcneil is a 33 y.o. female presenting today for menorrhagia.   A few days ago, she had a strange pressure in her pelvis like "something was going to fall out." She urinates often, and describes having spasms after urination. Her period last month lasted from 5/14 - 6/3, but normally she bleeds for 8-15 days. She soaked through four pads this morning and reports clotting. She has not had an [place] since 2018. She reports severe pain everyday during her periods and even when she is not bleeding. Her hips hurt and she has shooting pains in both legs at the same time. On the right side, the pain goes all the way down her leg. Pt reports constant nausea and back pain. The pain is worst before and during her periods -- after they end, the pain decreases. She gets weak very quickly when walking around her house.   She has been using BCPs to manage her period, but she still has periods. It makes the cramps less severe but she has not taken it in over a year. We are giving her Phenergan and Reglan. These work better than Zofran. Her PCP gave her Baclofen.  She is not having sexual intercourse because it causes severe pain inside her vagina and bleeding. After intercourse, she immediately has to get into the bath and has vomited before. Before, she was sexually active around 3-4x/wk but she has not been sexually active in 3 wks. 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 She has 2-8 bowel movements each day and her IBS is managed by her PCP. She has two children (17 and 13) and is considering more children after getting her symptoms under control.  However, she would prefer to be healthy than have another child. The patient denies fever, chills or any other symptoms or complaints at this time.   ROS:  ROS + pelvic pain + menorrhagia + weakness + shooting pains in legs + dyspareunia - fever - chills  Pertinent History Reviewed:   Reviewed: Significant for uterine fibroid Medical         Past Medical History:  Diagnosis Date  . Anxiety   . Back pain   . Constipation   . Depression   . Diverticulitis 10/31/2017  . GERD (gastroesophageal reflux disease)   . Glaucoma of both eyes   . Hematuria   . History of abnormal cervical Pap smear   . History of chronic gastritis   . History of kidney stones   . Hydronephrosis, right   . Renal calculi    bilateral per ct 0902-2017  . Right ureteral stone   . Trichimoniasis 09/05/2018  . Urgency of urination   . Uterine fibroid   . Uterine polyp   . Vaginal Pap smear, abnormal                               Surgical Hx:    Past Surgical History:  Procedure Laterality Date  .  CYSTOSCOPY W/ URETERAL STENT PLACEMENT Right 07/04/2016   Procedure: CYSTOSCOPY WITH RIGHT RETROGRADE PYELOGRAM, RIGHT URETERAL STENT PLACEMENT;  Surgeon: Ardis Hughs, MD;  Location: [facility];  Service: Urology;  Laterality: Right;  . CYSTOSCOPY WITH RETROGRADE PYELOGRAM, URETEROSCOPY AND STENT PLACEMENT Left 11/28/2007  . CYSTOSCOPY WITH RETROGRADE PYELOGRAM, URETEROSCOPY AND STENT PLACEMENT Right 08/02/2016   Procedure: CYSTOSCOPY WITH RIGHT  RETROGRADE PYELOGRAM, URETEROSCOPY,  AND STENT EXCHANGE;  Surgeon: Ardis Hughs, MD;  Location[facility];  Service: Urology;  Laterality: Right;  . ESOPHAGOGASTRODUODENOSCOPY N/A 05/17/2016   Dr. Oneida Alar: LA Grade A esophagitis, mild gastritis s/p biopsy noting reactive gastritis, medium sized hiatal hernia, next EGD WITH PROPOFOL   . INDUCED ABORTION  2009   Medications: Reviewed & Updated - see associated section                        Current Outpatient Medications:  .  baclofen (LIORESAL) 20 MG tablet, Take 20 mg by mouth 3 (three) times daily., Disp: , Rfl:  .  lidocaine (XYLOCAINE) 2 % solution, Use as directed 15 mLs in the mouth or throat as needed for mouth pain., Disp: 100 mL, Rfl: 2 .  ondansetron (ZOFRAN) 4 MG tablet, Take 4 mg by mouth every 8 (eight) hours as needed for nausea or vomiting., Disp: , Rfl:  .  pantoprazole (PROTONIX) 40 MG tablet, TAKE ONE TABLET (40 MG TOTAL) BY MOUTH DAILY., Disp: 30 tablet, Rfl: 3 .  promethazine (PHENERGAN) 12.5 MG tablet, Take 1 tablet (12.5 mg total) by mouth every 6 (six) hours as needed for nausea or vomiting (nausea)., Disp: 30 tablet, Rfl: 1 .  MONO-LINYAH 0.25-35 MG-MCG tablet, TAKE ONE TABLET ONCE DAILY TAKE CONTINOUSLY DO NOT TAKE THE WHITE TABLETS (Patient not taking: Reported on 05/15/2019), Disp: 1 Package, Rfl: 5   Social History: Reviewed -  reports that she has been smoking cigarettes. She has a 20.00 pack-year smoking history. She has never used smokeless tobacco.  Objective Findings:  Vitals: Blood pressure 129/89, pulse 96, height 5' 2"  (1.575 m), weight 151 lb (68.5 kg), last menstrual period 05/14/2019.  PHYSICAL EXAMINATION General appearance - alert, well appearing, and in no distress and oriented to person, place, and time Mental status - alert, oriented to person, place, and time, normal mood, behavior, speech, dress, motor activity, and thought processes, affect appropriate to mood  PELVIC Cervix: Heavy menstrual flow with dark blood in the vagina, introitus and both thighs. Uterus: retrograded uterus, sharp pressure with palpation Vagina: adequate support, side wall nontender Bladder: tender with palpation  Assessment & Plan:   A:  1. Menorrhagia 2. Chronic Pelvic Pain 3. Uterine retroversion 4. Dyspareunia   P:  1. Schedule [place] for next week 2. CBC, sed rate 3. Refill Rx Baclofen and Reglan  By signing my name below, I, De Burrs,  attest that this documentation has been prepared under the direction and in the presence of Jonnie Kind, MD. Electronically Signed: De Burrs, Medical Scribe. 05/15/19. 11:44 AM.  I personally performed the services described in this documentation, which was SCRIBED in my presence. The recorded information has been reviewed and considered accurate. It has been edited as necessary during review. Jonnie Kind, MD

## 2019-05-26 ENCOUNTER — Other Ambulatory Visit: Payer: Self-pay | Admitting: Obstetrics and Gynecology

## 2019-05-26 DIAGNOSIS — N921 Excessive and frequent menstruation with irregular cycle: Secondary | ICD-10-CM

## 2019-05-27 ENCOUNTER — Telehealth: Payer: Self-pay | Admitting: Obstetrics and Gynecology

## 2019-05-27 NOTE — Telephone Encounter (Signed)
Unable to reach pt with restrictions.

## 2019-05-28 ENCOUNTER — Ambulatory Visit (INDEPENDENT_AMBULATORY_CARE_PROVIDER_SITE_OTHER): Payer: Medicaid Other | Admitting: Obstetrics and Gynecology

## 2019-05-28 ENCOUNTER — Other Ambulatory Visit: Payer: Self-pay

## 2019-05-28 ENCOUNTER — Encounter: Payer: Self-pay | Admitting: Obstetrics and Gynecology

## 2019-05-28 ENCOUNTER — Ambulatory Visit (INDEPENDENT_AMBULATORY_CARE_PROVIDER_SITE_OTHER): Payer: Medicaid Other

## 2019-05-28 VITALS — BP 109/66 | HR 88 | Ht 62.0 in | Wt 154.0 lb

## 2019-05-28 DIAGNOSIS — N854 Malposition of uterus: Secondary | ICD-10-CM

## 2019-05-28 DIAGNOSIS — N946 Dysmenorrhea, unspecified: Secondary | ICD-10-CM | POA: Diagnosis not present

## 2019-05-28 DIAGNOSIS — N921 Excessive and frequent menstruation with irregular cycle: Secondary | ICD-10-CM | POA: Diagnosis not present

## 2019-05-28 MED ORDER — NORETHINDRONE 0.35 MG PO TABS: 1.0000 | ORAL_TABLET | Freq: Every day | ORAL | 11 refills | Status: DC

## 2019-05-28 NOTE — Progress Notes (Signed)
PELVIC US TA/TV:heterogeneous retroverted uterus with a fundal left subserosal fibroid 2.6 x 2.6 x 2.5 cm,simple nabothian cyst 1 x .9 x 1 cm,2.2 x .7 x 1.6 echogenic endometrial mass w/color flow (? Polyp),EEC 7.9 mm,normal ovaries bilat,no free fluid,left adnexal pain during ultrasound

## 2019-05-28 NOTE — Progress Notes (Signed)
Patient ID: Jeanne Mcneil, female   DOB: 1985/12/08, 33 y.o.   MRN: [id number]    [facility] Visit  @DATE @            Patient name: Jeanne Mcneil MRN [id number]  Date of birth: 1986-11-09  CC & HPI:  [name] is a 33 y.o. female presenting today for u/s results and discussion. U/s dine today showed retroverted uterus with single small 1 inch left-sided lower uterine fibroid, all other finding within normal limits. Says there was discomfort during transvaginal when using wand on the left. Has pain during period. Says she can't eat, drink, throbbing down the inside of her leg and severe cramping is associated with her periods.  She really just wants to have less dysmenorrhea  ROS:  ROS +small uterine fibroid  Pertinent History Reviewed:   Reviewed:  Medical         Past Medical History:  Diagnosis Date  . Anxiety   . Back pain   . Constipation   . Depression   . Diverticulitis 10/31/2017  . GERD (gastroesophageal reflux disease)   . Glaucoma of both eyes   . Hematuria   . History of abnormal cervical Pap smear   . History of chronic gastritis   . History of kidney stones   . Hydronephrosis, right   . Renal calculi    bilateral per ct 0902-2017  . Right ureteral stone   . Trichimoniasis 09/05/2018  . Urgency of urination   . Uterine fibroid   . Uterine polyp   . Vaginal Pap smear, abnormal                               Surgical Hx:    Past Surgical History:  Procedure Laterality Date  . CYSTOSCOPY W/ URETERAL STENT PLACEMENT Right 07/04/2016   Procedure: CYSTOSCOPY WITH RIGHT RETROGRADE PYELOGRAM, RIGHT URETERAL STENT PLACEMENT;  Surgeon: Ardis Hughs, MD;  Location: [facility];  Service: Urology;  Laterality: Right;  . CYSTOSCOPY WITH RETROGRADE PYELOGRAM, URETEROSCOPY AND STENT PLACEMENT Left 11/28/2007  . CYSTOSCOPY WITH RETROGRADE PYELOGRAM, URETEROSCOPY AND STENT PLACEMENT Right 08/02/2016   Procedure: CYSTOSCOPY WITH RIGHT  RETROGRADE PYELOGRAM,  URETEROSCOPY,  AND STENT EXCHANGE;  Surgeon: Ardis Hughs, MD;  Location: [facility];  Service: Urology;  Laterality: Right;  . ESOPHAGOGASTRODUODENOSCOPY N/A 05/17/2016   Dr. Oneida Alar: LA Grade A esophagitis, mild gastritis s/p biopsy noting reactive gastritis, medium sized hiatal hernia, next EGD WITH PROPOFOL   . INDUCED ABORTION  2009   Medications: Reviewed & Updated - see associated section                       Current Outpatient Medications:  .  baclofen (LIORESAL) 20 MG tablet, Take 1 tablet (20 mg total) by mouth 3 (three) times daily., Disp: 30 each, Rfl: 2 .  lidocaine (XYLOCAINE) 2 % solution, Use as directed 15 mLs in the mouth or throat as needed for mouth pain., Disp: 100 mL, Rfl: 2 .  megestrol (MEGACE) 40 MG tablet, Take 1 tablet (40 mg total) by mouth 3 (three) times daily. Til bleeding controlled then daily, Disp: 45 tablet, Rfl: 2 .  metoCLOPramide (REGLAN) 10 MG tablet, Take 1 tablet (10 mg total) by mouth every 6 (six) hours as needed for nausea or vomiting., Disp: 30 tablet, Rfl: 2 .  MONO-LINYAH 0.25-35 MG-MCG tablet, TAKE ONE  TABLET ONCE DAILY TAKE CONTINOUSLY DO NOT TAKE THE WHITE TABLETS (Patient not taking: Reported on 05/15/2019), Disp: 1 Package, Rfl: 5 .  ondansetron (ZOFRAN) 4 MG tablet, Take 4 mg by mouth every 8 (eight) hours as needed for nausea or vomiting., Disp: , Rfl:  .  pantoprazole (PROTONIX) 40 MG tablet, TAKE ONE TABLET (40 MG TOTAL) BY MOUTH DAILY., Disp: 30 tablet, Rfl: 3 .  promethazine (PHENERGAN) 12.5 MG tablet, Take 1 tablet (12.5 mg total) by mouth every 6 (six) hours as needed for nausea or vomiting (nausea)., Disp: 30 tablet, Rfl: 1   Social History: Reviewed -  reports that she has been smoking cigarettes. She has a 20.00 pack-year smoking history. She has never used smokeless tobacco.  Objective Findings:  Vitals: Last menstrual period 05/14/2019.  PHYSICAL EXAMINATION General appearance - alert, well appearing, and in  no distress Mental status - alert, oriented to person, place, and time, normal mood, behavior, speech, dress, motor activity, and thought processes, affect appropriate to mood  PELVIC DEFERRED U/S DISCUSSION ONLY ultrasound reviewed with patient Assessment & Plan:   A:  1.  Uterine fibroid,  2. Dysmenorrhea with uterine retroversion 3.  Patient reassured that there is nothing that looks cancerous or precancerous P:  1. F/u in 3 month 2. Rx Micronor  3. Pt to keep pain log   By signing my name below, I, Samul Dada, attest that this documentation has been prepared under the direction and in the presence of Jonnie Kind, MD. Electronically Signed: [name]. 05/28/19. 4:08 PM.  I personally performed the services described in this documentation, which was SCRIBED in my presence. The recorded information has been reviewed and considered accurate. It has been edited as necessary during review. Jonnie Kind, MD

## 2019-06-09 ENCOUNTER — Telehealth: Payer: Self-pay | Admitting: Obstetrics and Gynecology

## 2019-06-09 NOTE — Telephone Encounter (Signed)
Left message @ 4:03 pm. JSY

## 2019-06-09 NOTE — Telephone Encounter (Signed)
Please call pt she wants to get a refill on some cream

## 2019-06-10 ENCOUNTER — Telehealth: Payer: Self-pay | Admitting: Obstetrics and Gynecology

## 2019-06-10 MED ORDER — MUPIROCIN 2 % EX OINT: 1.0000 "application " | TOPICAL_OINTMENT | Freq: Two times a day (BID) | CUTANEOUS | 1 refills | Status: DC

## 2019-06-10 NOTE — Telephone Encounter (Signed)
Sent rx for mupirocin topical.  Unable to reach pt on any of the phone contact numbers.

## 2019-06-10 NOTE — Telephone Encounter (Addendum)
Pt is requesting a cream to help with impetigo. Pt states her PCP won't order because she hasn't been seen there in a while and she has no transportation to go. I advised I didn't know if dr would order but I would ask. Please advise. Thanks!! [place]

## 2019-06-10 NOTE — Telephone Encounter (Signed)
Jeanne Mcneil is unavailable at any of the listed contact numbers. Will escribe mupirocin for presumed skin infection.

## 2019-09-02 ENCOUNTER — Telehealth: Payer: Self-pay | Admitting: *Deleted

## 2019-09-02 NOTE — Telephone Encounter (Signed)
Pt left message requesting an rx for yeast for her and her partner. Also wants visit for std screening.

## 2019-09-03 ENCOUNTER — Telehealth: Payer: Self-pay | Admitting: *Deleted

## 2019-09-03 NOTE — Telephone Encounter (Signed)
Pt left message that she would like flagyl sent in for yeast for her and her partner. She called yesterday and wants a call back today before 3.

## 2019-09-03 NOTE — Telephone Encounter (Signed)
Left message with pt's mom to return call @ 11:37 am. [facility]

## 2019-09-03 NOTE — Telephone Encounter (Signed)
Called back with a different number to reach her at [phone].

## 2019-09-04 NOTE — Telephone Encounter (Signed)
Left message @ different # @ 4:28 pm. JSY

## 2019-09-05 NOTE — Telephone Encounter (Signed)
Spoke with pt. Pt is requesting a STD screen on 10/20 when she sees Dr. Glo Herring. I made a note on pt's appt line that she wants screening. [place]

## 2019-09-08 ENCOUNTER — Telehealth: Payer: Self-pay | Admitting: Obstetrics and Gynecology

## 2019-09-08 NOTE — Telephone Encounter (Signed)
Unable to reach pt to inform of restrictions.

## 2019-09-09 ENCOUNTER — Other Ambulatory Visit (HOSPITAL_COMMUNITY)
Admission: RE | Admit: 2019-09-09 | Discharge: 2019-09-09 | Disposition: A | Discharge status: Home / Self Care | Payer: Medicaid Other | Source: Ambulatory Visit | Attending: Obstetrics and Gynecology | Admitting: Obstetrics and Gynecology

## 2019-09-09 ENCOUNTER — Ambulatory Visit (INDEPENDENT_AMBULATORY_CARE_PROVIDER_SITE_OTHER): Payer: Medicaid Other | Admitting: Obstetrics and Gynecology

## 2019-09-09 ENCOUNTER — Encounter: Payer: Self-pay | Admitting: Obstetrics and Gynecology

## 2019-09-09 ENCOUNTER — Other Ambulatory Visit: Payer: Self-pay

## 2019-09-09 VITALS — BP 141/87 | HR 76 | Ht 62.0 in | Wt 156.2 lb

## 2019-09-09 DIAGNOSIS — Z113 Encounter for screening for infections with a predominantly sexual mode of transmission: Secondary | ICD-10-CM | POA: Diagnosis not present

## 2019-09-09 DIAGNOSIS — N9412 Deep dyspareunia: Secondary | ICD-10-CM | POA: Diagnosis not present

## 2019-09-09 MED ORDER — ESTRADIOL 1 MG PO TABS: 1.0000 mg | ORAL_TABLET | Freq: Every day | ORAL | 12 refills | Status: DC

## 2019-09-09 NOTE — Progress Notes (Signed)
Patient ID: Jeanne Mcneil, female   DOB: November 12, 1986, 33 y.o.   MRN: [id number]    [facility] Visit  @DATE @            Patient name: Jeanne Mcneil MRN [id number]  Date of birth: 1986/10/11  CC & HPI:  Jeanne Mcneil is a 33 y.o. female presenting today for a 3 month f/u of her dysmenorrhea. At her last visit she was prescribed micronor and told to keep a pain log record. Would like STI screening with HIV testing today as well. Partner cheated on her and notices her vaginal discharge has changed. Partner is using excuse for stepping out that she is not as sexually active enough for him.  Vaginal secretions is brownish with lower abdominal and back pain. Vaginal entrance is irritated, anus itches and has nausea when having a BM. She only has vaginal intercourse, has never done anal  Dysmenorrhea is not as bad but can't tell the difference from regular pain and pain from discharge. She states that she has been bleeding everyday for the past month. She notices that she now has stress incontinence, with some urge incontinence during sex.   ROS:  ROS +vaginal irritation +anal itching +light vaginal bleeding +stress incontinence -urge incontinence  Pertinent History Reviewed:   Reviewed: Significant for  Medical         Past Medical History:  Diagnosis Date   Anxiety    Back pain    Constipation    Depression    Diverticulitis 10/31/2017   GERD (gastroesophageal reflux disease)    Glaucoma of both eyes    Hematuria    History of abnormal cervical Pap smear    History of chronic gastritis    History of kidney stones    Hydronephrosis, right    Renal calculi    bilateral per ct 0902-2017   Right ureteral stone    Trichimoniasis 09/05/2018   Urgency of urination    Uterine fibroid    Uterine polyp    Vaginal Pap smear, abnormal                               Surgical Hx:    Past Surgical History:  Procedure Laterality Date   CYSTOSCOPY W/  URETERAL STENT PLACEMENT Right 07/04/2016   Procedure: CYSTOSCOPY WITH RIGHT RETROGRADE PYELOGRAM, RIGHT URETERAL STENT PLACEMENT;  Surgeon: Ardis Hughs, MD;  Location: [facility];  Service: Urology;  Laterality: Right;   CYSTOSCOPY WITH RETROGRADE PYELOGRAM, URETEROSCOPY AND STENT PLACEMENT Left 11/28/2007   CYSTOSCOPY WITH RETROGRADE PYELOGRAM, URETEROSCOPY AND STENT PLACEMENT Right 08/02/2016   Procedure: CYSTOSCOPY WITH RIGHT  RETROGRADE PYELOGRAM, URETEROSCOPY,  AND STENT EXCHANGE;  Surgeon: Ardis Hughs, MD;  Location: [facility];  Service: Urology;  Laterality: Right;   ESOPHAGOGASTRODUODENOSCOPY N/A 05/17/2016   Dr. Oneida Alar: LA Grade A esophagitis, mild gastritis s/p biopsy noting reactive gastritis, medium sized hiatal hernia, next EGD WITH PROPOFOL    INDUCED ABORTION  2009   Medications: Reviewed & Updated - see associated section                       Current Outpatient Medications:    baclofen (LIORESAL) 20 MG tablet, Take 1 tablet (20 mg total) by mouth 3 (three) times daily., Disp: 30 each, Rfl: 2   lidocaine (XYLOCAINE) 2 % solution, Use as directed 15 mLs in the mouth  or throat as needed for mouth pain., Disp: 100 mL, Rfl: 2   megestrol (MEGACE) 40 MG tablet, Take 1 tablet (40 mg total) by mouth 3 (three) times daily. Til bleeding controlled then daily, Disp: 45 tablet, Rfl: 2   metoCLOPramide (REGLAN) 10 MG tablet, Take 1 tablet (10 mg total) by mouth every 6 (six) hours as needed for nausea or vomiting., Disp: 30 tablet, Rfl: 2   MONO-LINYAH 0.25-35 MG-MCG tablet, TAKE ONE TABLET ONCE DAILY TAKE CONTINOUSLY DO NOT TAKE THE WHITE TABLETS (Patient not taking: Reported on 05/15/2019), Disp: 1 Package, Rfl: 5   mupirocin ointment (BACTROBAN) 2 %, Place 1 application into the nose 2 (two) times daily., Disp: 22 g, Rfl: 1   norethindrone (MICRONOR) 0.35 MG tablet, Take 1 tablet (0.35 mg total) by mouth daily., Disp: 1 Package, Rfl: 11   ondansetron  (ZOFRAN) 4 MG tablet, Take 4 mg by mouth every 8 (eight) hours as needed for nausea or vomiting., Disp: , Rfl:    pantoprazole (PROTONIX) 40 MG tablet, TAKE ONE TABLET (40 MG TOTAL) BY MOUTH DAILY., Disp: 30 tablet, Rfl: 3   promethazine (PHENERGAN) 12.5 MG tablet, Take 1 tablet (12.5 mg total) by mouth every 6 (six) hours as needed for nausea or vomiting (nausea)., Disp: 30 tablet, Rfl: 1   Social History: Reviewed -  reports that she has been smoking cigarettes. She has a 20.00 pack-year smoking history. She has never used smokeless tobacco.  Objective Findings:  Vitals: There were no vitals taken for this visit.  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 History of ovarian cyst causing  LLQ pain 2. dysmenorrhea 3. 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 STI/STD screening 5. Stress incontinence  P:  1.  F/u PRN    By signing my name below, I, Jeanne Mcneil, attest that this documentation has been prepared under the direction and in the presence of Jonnie Kind, MD. Electronically Signed: [place]. 09/09/19. 9:17 AM.  I personally performed the services described in this documentation, which was SCRIBED in my presence. The recorded information has been reviewed and considered accurate. It has been edited as necessary during review. Jonnie Kind, MD

## 2019-09-10 ENCOUNTER — Telehealth: Payer: Self-pay | Admitting: *Deleted

## 2019-09-10 LAB — RPR: RPR Ser Ql: NONREACTIVE

## 2019-09-10 LAB — HIV ANTIBODY (ROUTINE TESTING W REFLEX): HIV Screen 4th Generation wRfx: NONREACTIVE

## 2019-09-10 NOTE — Telephone Encounter (Signed)
Called patient back, no answer. Results are not back yet.

## 2019-09-10 NOTE — Telephone Encounter (Signed)
Pt left message that she wants her results.

## 2019-09-11 ENCOUNTER — Other Ambulatory Visit: Payer: Self-pay | Admitting: Obstetrics and Gynecology

## 2019-09-11 LAB — CERVICOVAGINAL ANCILLARY ONLY
Chlamydia: NEGATIVE
Comment: NEGATIVE
Comment: NEGATIVE
Comment: NORMAL
Neisseria Gonorrhea: NEGATIVE
Trichomonas: POSITIVE — AB

## 2019-09-11 MED ORDER — METRONIDAZOLE 500 MG PO TABS: 500.0000 mg | ORAL_TABLET | Freq: Two times a day (BID) | ORAL | 0 refills | Status: DC

## 2019-09-11 NOTE — Progress Notes (Signed)
+   trich, will Rx and have office call pt.

## 2019-09-11 NOTE — Telephone Encounter (Signed)
Left message that results are not back yet and that she can check back in 7-10 days from the date of swab.

## 2019-09-11 NOTE — Telephone Encounter (Signed)
Patient called, stated that she's in a lot of pain and wanted her results.  Estill Bamberg had already left a message for the patient that the results are not back yet.  Discussed with Tish and she checked again, the results still are not back.  The patient stated that Dr. Glo Herring told her to call between Thursday and Friday to check for her results.  I told the patient that the nurse said they may be back tomorrow if she'd like to call back tomorrow.

## 2019-09-15 ENCOUNTER — Telehealth: Payer: Self-pay | Admitting: *Deleted

## 2019-09-15 NOTE — Telephone Encounter (Signed)
Patient informed +trich.  Medication sent in for patient and partner. Advised no sex or alcohol for 7 days and will be rechecked at next visit.  Pt verbalized understanding with no further questions.

## 2019-10-09 ENCOUNTER — Ambulatory Visit (INDEPENDENT_AMBULATORY_CARE_PROVIDER_SITE_OTHER)
Admission: RE | Admit: 2019-10-09 | Discharge: 2019-10-09 | Disposition: A | Discharge status: Home / Self Care | Payer: Medicaid Other | Source: Ambulatory Visit

## 2019-10-09 DIAGNOSIS — K5900 Constipation, unspecified: Secondary | ICD-10-CM | POA: Diagnosis not present

## 2019-10-09 DIAGNOSIS — R1032 Left lower quadrant pain: Secondary | ICD-10-CM

## 2019-10-09 MED ORDER — MAGNESIUM CITRATE PO SOLN: 1.0000 | Freq: Once | ORAL | 0 refills | Status: AC

## 2019-10-09 MED ORDER — DOCUSATE SODIUM 100 MG PO CAPS: 100.0000 mg | ORAL_CAPSULE | Freq: Every day | ORAL | 0 refills | Status: DC

## 2019-10-09 NOTE — ED Provider Notes (Signed)
Virtual Visit via Video Note:  Jeanne Mcneil  initiated request for Telemedicine visit with [facility] team. I connected with Jeanne Mcneil  on 10/09/2019 at 12:24 PM  for a synchronized telemedicine visit using a video enabled HIPPA compliant telemedicine application. I verified that I am speaking with Jeanne Mcneil  using two identifiers. Sharion Balloon, NP  was physically located in a [facility] Urgent care site and Jeanne Mcneil was located at a different location.   The limitations of evaluation and management by telemedicine as well as the availability of in-person appointments were discussed. Patient was informed that she  may incur a bill ( including co-pay) for this virtual visit encounter. Jeanne Mcneil  expressed understanding and gave verbal consent to proceed with virtual visit.     History of Present Illness:Jeanne Mcneil  is a 33 y.o. female presents for evaluation of abdominal pain, headache, and constipation x 3 days.  Last normal bowel movement 3 days ago; this morning she reports a small amount of stool.  She denies fever, chills, vomiting, nausea, diarrhea, or other symptoms.  No treatments attempted at home.     Allergies  Allergen Reactions  . Nsaids Other (See Comments)    Stomach bleeding  . Tape Other (See Comments)    Reaction:  Skin blisters and burns. Use paper tape only!!  . Keflex [Cephalexin] Other (See Comments)    Reaction:  Bladder spasms  . Codeine Hives and Itching  . Vicodin [Hydrocodone-Acetaminophen] Hives and Itching     Past Medical History:  Diagnosis Date  . Anxiety   . Back pain   . Constipation   . Depression   . Diverticulitis 10/31/2017  . GERD (gastroesophageal reflux disease)   . Glaucoma of both eyes   . Hematuria   . History of abnormal cervical Pap smear   . History of chronic gastritis   . History of kidney stones   . Hydronephrosis, right   . Renal calculi    bilateral per ct 0902-2017  . Right  ureteral stone   . Trichimoniasis 09/05/2018  . Urgency of urination   . Uterine fibroid   . Uterine polyp   . Vaginal Pap smear, abnormal      Social History   Tobacco Use  . Smoking status: Current Every Day Smoker    Packs/day: 1.00    Years: 20.00    Pack years: 20.00    Types: Cigarettes  . Smokeless tobacco: Never Used  Substance Use Topics  . Alcohol use: No    Alcohol/week: 4.0 standard drinks    Types: 4 Shots of liquor per week    Comment: states has not used in 2 years  . Drug use: Yes    Types: Marijuana        Observations/Objective: Physical Exam  VITALS: Patient denies fever. GENERAL: Alert, appears well and in no acute distress. HEENT: Atraumatic. NECK: Normal movements of the head and neck. CARDIOPULMONARY: No increased WOB. Speaking in clear sentences. I:E ratio WNL.  ABD: Patient points to her LLQ to show location of her abdominal pain.  MS: Moves all visible extremities without noticeable abnormality. PSYCH: Pleasant and cooperative, well-groomed. Speech normal rate and rhythm. Affect is appropriate. Insight and judgement are appropriate. Attention is focused, linear, and appropriate.  NEURO: CN grossly intact. Oriented as arrived to appointment on time with no prompting. Moves both UE equally.     Assessment and Plan:  ICD-10-CM   1. Constipation, unspecified constipation type  K59.00   2. Left lower quadrant abdominal pain  R10.32        Follow Up Instructions: Treating with magnesium citrate and Colace.  Instructed patient to take the mag citrate today and start the Colace tomorrow.  Instructed her to follow-up with her PCP or come here to be seen in person if her symptoms or not improving.  Instructed her to go to the emergency department if she has acute worsening symptoms, including increased abdominal pain.  Patient agrees to plan of care.    I discussed the assessment and treatment plan with the patient. The patient was provided an  opportunity to ask questions and all were answered. The patient agreed with the plan and demonstrated an understanding of the instructions.   The patient was advised to call back or seek an in-person evaluation if the symptoms worsen or if the condition fails to improve as anticipated.      Sharion Balloon, NP  10/09/2019 12:24 PM         Sharion Balloon, NP 10/09/19 1224

## 2019-10-09 NOTE — Discharge Instructions (Signed)
Take the magnesium citrate today.  Start taking the [employer].    Come here to be seen in person or follow-up with your primary care provider if your symptoms are not improving.    Go to the emergency department if you have acute worsening symptoms, including increased abdominal pain.

## 2019-10-14 ENCOUNTER — Other Ambulatory Visit: Payer: Medicaid Other | Admitting: Obstetrics and Gynecology

## 2019-12-01 ENCOUNTER — Ambulatory Visit: Payer: Self-pay | Admitting: *Deleted

## 2019-12-01 NOTE — Telephone Encounter (Signed)
Message from Nani Ravens sent at 12/01/2019 3:37 PM EST  Pt called in to be advised. Pt says that she have a pain in her back when she take a deep breath while laying down. Pt says that she doesn't have a PCP to assist her.   Pt c/o cold symptoms for two weeks. Works in [employer], employees positive for [employer], pt testing in morning at [place]. Instructed in OTC, saline gargles, steam/humidifier. Pt is in quarantine until COVID results back. No PCP, instructed in UC if needed for worsening symptoms.  Reason for Disposition . Cough with cold symptoms (e.g., runny nose, postnasal drip, throat clearing)  Answer Assessment - Initial Assessment Questions 1. ONSET: "When did the cough begin?"      2 weeks 2. SEVERITY: "How bad is the cough today?"      Moderate, bronchular 3. RESPIRATORY DISTRESS: "Describe your breathing."      Not distressed 4. FEVER: "Do you have a fever?" If so, ask: "What is your temperature, how was it measured, and when did it start?"     No fever 5. SPUTUM: "Describe the color of your sputum" (clear, white, yellow, green)     Slight yellow 6. HEMOPTYSIS: "Are you coughing up any blood?" If so ask: "How much?" (flecks, streaks, tablespoons, etc.)     no 7. CARDIAC HISTORY: "Do you have any history of heart disease?" (e.g., heart attack, congestive heart failure)      no 8. LUNG HISTORY: "Do you have any history of lung disease?"  (e.g., pulmonary embolus, asthma, emphysema)     no 9. PE RISK FACTORS: "Do you have a history of blood clots?" (or: recent major surgery, recent prolonged travel, bedridden)     no 10. OTHER SYMPTOMS: "Do you have any other symptoms?" (e.g., runny nose, wheezing, chest pain)       Expiratory "whistle" 11. PREGNANCY: "Is there any chance you are pregnant?" "When was your last menstrual period?"       no 12. TRAVEL: "Have you traveled out of the country in the last month?" (e.g., travel history, exposures)       no  Protocols used: [place]

## 2019-12-02 ENCOUNTER — Other Ambulatory Visit: Payer: Self-pay

## 2019-12-02 ENCOUNTER — Ambulatory Visit: Payer: Medicaid Other | Attending: Internal Medicine

## 2019-12-02 DIAGNOSIS — Z20822 Contact with and (suspected) exposure to covid-19: Secondary | ICD-10-CM

## 2019-12-04 ENCOUNTER — Telehealth: Payer: Self-pay | Admitting: *Deleted

## 2019-12-04 LAB — NOVEL CORONAVIRUS, NAA: SARS-CoV-2, NAA: NOT DETECTED

## 2019-12-04 NOTE — Telephone Encounter (Signed)
Patient called given negative covid results . 

## 2019-12-19 ENCOUNTER — Telehealth: Payer: Self-pay | Admitting: *Deleted

## 2019-12-19 NOTE — Telephone Encounter (Signed)
Pt states that she thinks she still has the std. Wants a call back to discuss. Called patient back and left voicemail to call us back and ask to speak with a nurse.

## 2019-12-22 ENCOUNTER — Telehealth: Payer: Self-pay | Admitting: *Deleted

## 2019-12-22 MED ORDER — METRONIDAZOLE 500 MG PO TABS: 500.0000 mg | ORAL_TABLET | Freq: Two times a day (BID) | ORAL | 0 refills | Status: DC

## 2019-12-22 NOTE — Telephone Encounter (Signed)
Will rx flagyl

## 2019-12-22 NOTE — Telephone Encounter (Signed)
Patient called requesting Metronidazole be sent to her pharmacy for treatment of trich.  She was treated back in October and thinks she has it again as she is having the same symptoms-itching, irritation, discharge.  Has a history of trich several times.  She is scheduled for a P/P on 2/17. Advised to check back with her pharmacy later this afternoon unless she heard from [place].

## 2020-01-07 ENCOUNTER — Ambulatory Visit (INDEPENDENT_AMBULATORY_CARE_PROVIDER_SITE_OTHER): Payer: Medicaid Other | Admitting: Obstetrics and Gynecology

## 2020-01-07 ENCOUNTER — Other Ambulatory Visit: Payer: Self-pay

## 2020-01-07 ENCOUNTER — Other Ambulatory Visit (HOSPITAL_COMMUNITY)
Admission: RE | Admit: 2020-01-07 | Discharge: 2020-01-07 | Disposition: A | Discharge status: Home / Self Care | Payer: Medicaid Other | Source: Ambulatory Visit | Attending: Obstetrics and Gynecology | Admitting: Obstetrics and Gynecology

## 2020-01-07 DIAGNOSIS — Z3202 Encounter for pregnancy test, result negative: Secondary | ICD-10-CM | POA: Diagnosis not present

## 2020-01-07 DIAGNOSIS — Z0000 Encounter for general adult medical examination without abnormal findings: Secondary | ICD-10-CM

## 2020-01-07 DIAGNOSIS — N6452 Nipple discharge: Secondary | ICD-10-CM

## 2020-01-07 LAB — POCT URINE PREGNANCY: Preg Test, Ur: NEGATIVE

## 2020-01-07 NOTE — Progress Notes (Addendum)
Patient ID: Jeanne Mcneil, female   DOB: 15-Feb-1986, 34 y.o.   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  Plan:  1. pap smear done, next pap due 1 year 2. return annually or prn 3    Annual mammogram advised after age 32 Subjective:  Jeanne Mcneil is a 34 y.o. female [phone] who presents for annual exam. No LMP recorded. The patient has complaints today of nipple discharge. She had TSH and prolactin done in lab today. She is currently on phenergan and zofran, which has side effects of milky discharge from nipples.   Her and her partner are not as active due to her dyspareunia. They have found different positions that help.   She had LGSIL with +HRHPV on 01/18/2018.  The following portions of the patient's history were reviewed and updated as appropriate: allergies, current medications, past family history, past medical history, past social history, past surgical history and problem list. Past Medical History:  Diagnosis Date  . Anxiety   . Back pain   . Constipation   . Depression   . Diverticulitis 10/31/2017  . GERD (gastroesophageal reflux disease)   . Glaucoma of both eyes   . Hematuria   . History of abnormal cervical Pap smear   . History of chronic gastritis   . History of kidney stones   . Hydronephrosis, right   . Renal calculi    bilateral per ct 0902-2017  . Right ureteral stone   . Trichimoniasis 09/05/2018  . Urgency of urination   . Uterine fibroid   . Uterine polyp   . Vaginal Pap smear, abnormal     Past Surgical History:  Procedure Laterality Date  . CYSTOSCOPY W/ URETERAL STENT PLACEMENT Right 07/04/2016   Procedure: CYSTOSCOPY WITH RIGHT RETROGRADE PYELOGRAM, RIGHT URETERAL STENT PLACEMENT;  Surgeon: Ardis Hughs, MD;  Location: [facility];  Service: Urology;  Laterality: Right;  . CYSTOSCOPY WITH RETROGRADE  PYELOGRAM, URETEROSCOPY AND STENT PLACEMENT Left 11/28/2007  . CYSTOSCOPY WITH RETROGRADE PYELOGRAM, URETEROSCOPY AND STENT PLACEMENT Right 08/02/2016   Procedure: CYSTOSCOPY WITH RIGHT  RETROGRADE PYELOGRAM, URETEROSCOPY,  AND STENT EXCHANGE;  Surgeon: Ardis Hughs, MD;  [facility];  Service: Urology;  Laterality: Right;  . ESOPHAGOGASTRODUODENOSCOPY N/A 05/17/2016   Dr. Oneida Alar: LA Grade A esophagitis, mild gastritis s/p biopsy noting reactive gastritis, medium sized hiatal hernia, next EGD WITH PROPOFOL   . INDUCED ABORTION  2009     Current Outpatient Medications:  .  baclofen (LIORESAL) 20 MG tablet, Take 1 tablet (20 mg total) by mouth 3 (three) times daily., Disp: 30 each, Rfl: 2 .  docusate sodium (COLACE) 100 MG capsule, Take 1 capsule (100 mg total) by mouth daily., Disp: 30 capsule, Rfl: 0 .  lidocaine (XYLOCAINE) 2 % solution, Use as directed 15 mLs in the mouth or throat as needed for mouth pain., Disp: 100 mL, Rfl: 2 .  megestrol (MEGACE) 40 MG tablet, Take 1 tablet (40 mg total) by mouth 3 (three) times daily. Til bleeding controlled then daily, Disp: 45 tablet, Rfl: 2 .  metoCLOPramide (REGLAN) 10 MG tablet, Take 1 tablet (10 mg total) by mouth every 6 (six) hours as needed for nausea or vomiting., Disp: 30 tablet, Rfl: 2 .  metroNIDAZOLE (FLAGYL) 500 MG tablet, Take 1 tablet (500 mg total) by mouth 2 (two) times daily., Disp: 14 tablet, Rfl:  0 .  MONO-LINYAH 0.25-35 MG-MCG tablet, TAKE ONE TABLET ONCE DAILY TAKE CONTINOUSLY DO NOT TAKE THE WHITE TABLETS, Disp: 1 Package, Rfl: 5 .  mupirocin ointment (BACTROBAN) 2 %, Place 1 application into the nose 2 (two) times daily. (Patient not taking: Reported on 09/09/2019), Disp: 22 g, Rfl: 1 .  norethindrone (MICRONOR) 0.35 MG tablet, Take 1 tablet (0.35 mg total) by mouth daily., Disp: 1 Package, Rfl: 11 .  ondansetron (ZOFRAN) 4 MG tablet, Take 4 mg by mouth every 8 (eight) hours as needed for nausea or  vomiting., Disp: , Rfl:  .  pantoprazole (PROTONIX) 40 MG tablet, TAKE ONE TABLET (40 MG TOTAL) BY MOUTH DAILY., Disp: 30 tablet, Rfl: 3 .  promethazine (PHENERGAN) 12.5 MG tablet, Take 1 tablet (12.5 mg total) by mouth every 6 (six) hours as needed for nausea or vomiting (nausea)., Disp: 30 tablet, Rfl: 1  Review of Systems Constitutional: negative Gastrointestinal: negative Genitourinary: normal  Objective:  There were no vitals taken for this visit.   BMI: There is no height or weight on file to calculate BMI.  General Appearance: Alert, appropriate appearance for age. No acute distress HEENT: Grossly normal Neck / Thyroid:  Cardiovascular: RRR; normal S1, S2, no murmur Lungs: CTA bilaterally Back: No CVAT Breast Exam: nipple discharge, bilateral, discharge is clearish/white in color and No masses or nodes.No dimpling, nipple retraction or discharge. Gastrointestinal: Soft, non-tender, no masses or organomegaly Pelvic Exam: VAGINA: normal appearing vagina  CERVIX:  multip mid poistion UTERUS: uterus 1st degree uterine descensud, nontender, retroverted PAP: Pap smear done today. Lymphatic Exam: Non-palpable nodes in neck, clavicular, axillary, or inguinal regions Skin: no rash some vitiligo on the back Neurologic: Normal gait and speech, no tremor  Psychiatric: Alert and oriented, appropriate affect.  Urinalysis:Not done  By signing my name below, I, Samul Dada, attest that this documentation has been prepared under the direction and in the presence of Jonnie Kind, MD. Electronically Signed: [place]. 01/07/20. 11:38 AM.  I personally performed the services described in this documentation, which was SCRIBED in my presence. The recorded information has been reviewed and considered accurate. It has been edited as necessary during review. Jonnie Kind, MD

## 2020-01-08 LAB — TSH: TSH: 1.77 u[IU]/mL (ref 0.450–4.500)

## 2020-01-08 LAB — PROLACTIN: Prolactin: 27.3 ng/mL — ABNORMAL HIGH (ref 4.8–23.3)

## 2020-01-09 LAB — CYTOLOGY - PAP
Chlamydia: NEGATIVE
Comment: NEGATIVE
Comment: NEGATIVE
Comment: NORMAL
Diagnosis: NEGATIVE
High risk HPV: NEGATIVE
Neisseria Gonorrhea: NEGATIVE

## 2020-02-16 ENCOUNTER — Telehealth: Payer: Self-pay | Admitting: Obstetrics and Gynecology

## 2020-02-16 NOTE — Telephone Encounter (Signed)
Patient called, stated her message is an emergency and it's private, she would not tell me what she needed.  She is requesting a nurse to give her a call.  [phone]

## 2020-02-23 ENCOUNTER — Other Ambulatory Visit: Payer: Self-pay | Admitting: Obstetrics and Gynecology

## 2020-02-23 ENCOUNTER — Telehealth: Payer: Self-pay | Admitting: Obstetrics and Gynecology

## 2020-02-23 MED ORDER — NORGESTIMATE-ETH ESTRADIOL 0.25-35 MG-MCG PO TABS: ORAL_TABLET | ORAL | 5 refills | Status: DC

## 2020-02-23 MED ORDER — BACLOFEN 20 MG PO TABS: 20.0000 mg | ORAL_TABLET | Freq: Three times a day (TID) | ORAL | 2 refills | Status: DC

## 2020-02-23 NOTE — Telephone Encounter (Signed)
Baclofen and metronidazole reordered.

## 2020-02-23 NOTE — Telephone Encounter (Signed)
Needs refill on her antibiotic and a muscle relaxer . I asked her to cal the pharmacy and she said she did and they told her to call us.

## 2020-02-23 NOTE — Addendum Note (Signed)
Addended by: Christiana Pellant A on: 02/23/2020 12:30 PM   Modules accepted: Orders

## 2020-02-23 NOTE — Telephone Encounter (Signed)
Called patient back and heard message that vm is not set up.

## 2020-02-23 NOTE — Telephone Encounter (Signed)
Needs refill flagyl (partner never treated for Trich) and also needs refill on baclofen.

## 2020-02-23 NOTE — Progress Notes (Signed)
Baclofen  And Metronidazole Rx reordered.

## 2020-02-24 ENCOUNTER — Other Ambulatory Visit: Payer: Self-pay | Admitting: Obstetrics and Gynecology

## 2020-02-24 MED ORDER — METRONIDAZOLE 500 MG PO TABS: 500.0000 mg | ORAL_TABLET | Freq: Two times a day (BID) | ORAL | 0 refills | Status: DC

## 2020-02-24 NOTE — Progress Notes (Signed)
Flagyl rx sent to [facility]

## 2020-03-11 ENCOUNTER — Other Ambulatory Visit: Payer: Self-pay

## 2020-03-11 ENCOUNTER — Ambulatory Visit: Payer: Medicaid Other | Attending: Internal Medicine

## 2020-03-11 DIAGNOSIS — Z20822 Contact with and (suspected) exposure to covid-19: Secondary | ICD-10-CM

## 2020-03-12 LAB — SARS-COV-2, NAA 2 DAY TAT

## 2020-03-12 LAB — NOVEL CORONAVIRUS, NAA: SARS-CoV-2, NAA: DETECTED — AB

## 2020-05-07 ENCOUNTER — Other Ambulatory Visit: Payer: Self-pay | Admitting: Obstetrics and Gynecology

## 2020-05-19 DIAGNOSIS — K5792 Diverticulitis of intestine, part unspecified, without perforation or abscess without bleeding: Secondary | ICD-10-CM | POA: Diagnosis not present

## 2020-05-19 DIAGNOSIS — R509 Fever, unspecified: Secondary | ICD-10-CM | POA: Diagnosis not present

## 2020-05-19 DIAGNOSIS — M791 Myalgia, unspecified site: Secondary | ICD-10-CM | POA: Diagnosis not present

## 2020-05-19 DIAGNOSIS — R5381 Other malaise: Secondary | ICD-10-CM | POA: Diagnosis not present

## 2020-06-17 ENCOUNTER — Other Ambulatory Visit: Payer: Self-pay | Admitting: Obstetrics and Gynecology

## 2020-06-18 NOTE — Telephone Encounter (Signed)
micronor refilled 1 yr

## 2020-06-28 ENCOUNTER — Other Ambulatory Visit: Payer: Self-pay | Admitting: Obstetrics and Gynecology

## 2020-08-25 DIAGNOSIS — F331 Major depressive disorder, recurrent, moderate: Secondary | ICD-10-CM | POA: Diagnosis not present

## 2020-08-26 ENCOUNTER — Other Ambulatory Visit: Payer: Self-pay | Admitting: Adult Health

## 2020-10-05 DIAGNOSIS — F331 Major depressive disorder, recurrent, moderate: Secondary | ICD-10-CM | POA: Diagnosis not present

## 2021-01-20 ENCOUNTER — Ambulatory Visit: Payer: Medicaid Other | Admitting: Nurse Practitioner

## 2021-03-17 ENCOUNTER — Other Ambulatory Visit: Payer: Self-pay | Admitting: Adult Health

## 2021-05-03 ENCOUNTER — Telehealth: Payer: Self-pay | Admitting: Physician Assistant

## 2021-05-03 NOTE — Telephone Encounter (Signed)
..   Medicaid Managed Care   Unsuccessful Outreach Note  05/03/2021 Name: Jeanne Mcneil MRN: [id number] DOB: Dec 28, 1985  Referred by: Trey Sailors, PA Reason for referral : High Risk Managed Medicaid (Attempted to contact patient today to get her scheduled for a phone visit with the [facility] Team. No one answered and the VM is not set up.)   An unsuccessful telephone outreach was attempted today. The patient was referred to the case management team for assistance with care management and care coordination.   Follow Up Plan: The care management team will reach out to the patient again over the next 7-14 days.   [place]

## 2021-05-26 ENCOUNTER — Telehealth: Payer: Self-pay | Admitting: Physician Assistant

## 2021-05-26 NOTE — Telephone Encounter (Signed)
..   Medicaid Managed Care   Unsuccessful Outreach Note  05/26/2021 Name: Jeanne Mcneil MRN: [id number] DOB: December 30, 1985  Referred by: Trey Sailors, PA Reason for referral : High Risk Managed Medicaid (Attempted to reach [name] today to get her scheduled with the Managed Medicaid team for a phone visit. Her VM was not set up.)   A second unsuccessful telephone outreach was attempted today. The patient was referred to the case management team for assistance with care management and care coordination.   Follow Up Plan: The care management team will reach out to the patient again over the next 7-14 days.   [place]

## 2021-05-31 ENCOUNTER — Other Ambulatory Visit: Payer: Self-pay | Admitting: Women's Health

## 2021-06-21 ENCOUNTER — Other Ambulatory Visit: Payer: Self-pay

## 2021-06-21 ENCOUNTER — Encounter: Payer: Self-pay | Admitting: Obstetrics & Gynecology

## 2021-06-21 ENCOUNTER — Other Ambulatory Visit (HOSPITAL_COMMUNITY)
Admission: RE | Admit: 2021-06-21 | Discharge: 2021-06-21 | Disposition: A | Discharge status: Home / Self Care | Payer: Medicaid Other | Source: Ambulatory Visit | Attending: Obstetrics & Gynecology | Admitting: Obstetrics & Gynecology

## 2021-06-21 ENCOUNTER — Ambulatory Visit (INDEPENDENT_AMBULATORY_CARE_PROVIDER_SITE_OTHER): Payer: Medicaid Other | Admitting: Obstetrics & Gynecology

## 2021-06-21 VITALS — BP 131/78 | HR 81 | Ht 62.0 in | Wt 189.5 lb

## 2021-06-21 DIAGNOSIS — N921 Excessive and frequent menstruation with irregular cycle: Secondary | ICD-10-CM

## 2021-06-21 DIAGNOSIS — Z124 Encounter for screening for malignant neoplasm of cervix: Secondary | ICD-10-CM | POA: Diagnosis not present

## 2021-06-21 DIAGNOSIS — N941 Unspecified dyspareunia: Secondary | ICD-10-CM | POA: Diagnosis not present

## 2021-06-21 DIAGNOSIS — N812 Incomplete uterovaginal prolapse: Secondary | ICD-10-CM

## 2021-06-21 LAB — POCT HEMOGLOBIN: Hemoglobin: 14.4 g/dL (ref 11–14.6)

## 2021-06-21 MED ORDER — MEGESTROL ACETATE 40 MG PO TABS: ORAL_TABLET | ORAL | 3 refills | Status: DC

## 2021-06-21 NOTE — Progress Notes (Signed)
Chief Complaint  Patient presents with   having heavy bleeding    Wants to have a hyst      35 y.o. [id number] No LMP recorded. The current method of family planning is oral progesterone-only contraceptive.  Outpatient Encounter Medications as of 06/21/2021  Medication Sig   baclofen (LIORESAL) 20 MG tablet TAKE 1 TABLET (20 MG TOTAL) BY MOUTH 3 (THREE) TIMES DAILY.   lidocaine (XYLOCAINE) 2 % solution Use as directed 15 mLs in the mouth or throat as needed for mouth pain.   megestrol (MEGACE) 40 MG tablet TAKE ONE TABLET (40MG TOTAL) BY MOUTH THREE TIMES DAILY UNTIL BLEEDING IS CONTROLLED THEN DAILY   megestrol (MEGACE) 40 MG tablet 3 tablets a day for 5 days, 2 tablets a day for 5 days then 1 tablet daily   metoCLOPramide (REGLAN) 10 MG tablet Take 1 tablet (10 mg total) by mouth every 6 (six) hours as needed for nausea or vomiting.   norethindrone (MICRONOR) 0.35 MG tablet TAKE ONE TABLET (0.35 MG TOTAL) BY MOUTHDAILY.   ondansetron (ZOFRAN) 4 MG tablet Take 4 mg by mouth every 8 (eight) hours as needed for nausea or vomiting.   pantoprazole (PROTONIX) 40 MG tablet TAKE ONE TABLET (40 MG TOTAL) BY MOUTH DAILY.   promethazine (PHENERGAN) 12.5 MG tablet Take 1 tablet (12.5 mg total) by mouth every 6 (six) hours as needed for nausea or vomiting (nausea).   [DISCONTINUED] docusate sodium (COLACE) 100 MG capsule Take 1 capsule (100 mg total) by mouth daily.   [DISCONTINUED] metroNIDAZOLE (FLAGYL) 500 MG tablet Take 1 tablet (500 mg total) by mouth 2 (two) times daily.   [DISCONTINUED] mupirocin ointment (BACTROBAN) 2 % Place 1 application into the nose 2 (two) times daily. (Patient not taking: Reported on 09/09/2019)   No facility-administered encounter medications on file as of 06/21/2021.    Subjective Pt with 2 year+ history of dyspareunia and long history of DUB which she manages with megestrol She bascially does not have sex any more due to the pain  Feels like it is hitting  something Past Medical History:  Diagnosis Date   Anxiety    Back pain    Constipation    Depression    Diverticulitis 10/31/2017   GERD (gastroesophageal reflux disease)    Glaucoma of both eyes    Hematuria    History of abnormal cervical Pap smear    History of chronic gastritis    History of kidney stones    Hydronephrosis, right    Renal calculi    bilateral per ct 0902-2017   Right ureteral stone    Trichimoniasis 09/05/2018   Urgency of urination    Uterine fibroid    Uterine polyp    Vaginal Pap smear, abnormal     Past Surgical History:  Procedure Laterality Date   CYSTOSCOPY W/ URETERAL STENT PLACEMENT Right 07/04/2016   Procedure: CYSTOSCOPY WITH RIGHT RETROGRADE PYELOGRAM, RIGHT URETERAL STENT PLACEMENT;  Surgeon: Ardis Hughs, MD;  Location: [facility];  Service: Urology;  Laterality: Right;   CYSTOSCOPY WITH RETROGRADE PYELOGRAM, URETEROSCOPY AND STENT PLACEMENT Left 11/28/2007   CYSTOSCOPY WITH RETROGRADE PYELOGRAM, URETEROSCOPY AND STENT PLACEMENT Right 08/02/2016   Procedure: CYSTOSCOPY WITH RIGHT  RETROGRADE PYELOGRAM, URETEROSCOPY,  AND STENT EXCHANGE;  Surgeon: Ardis Hughs, MD;  Location: [facility];  Service: Urology;  Laterality: Right;   ESOPHAGOGASTRODUODENOSCOPY N/A 05/17/2016   Dr. Oneida Alar: LA Grade A esophagitis, mild gastritis s/p biopsy noting reactive  gastritis, medium sized hiatal hernia, next EGD WITH PROPOFOL    INDUCED ABORTION  01-24-2008    OB History     Gravida  3   Para  2   Term  2   Preterm      AB  1   Living  2      SAB  1   IAB      Ectopic      Multiple      Live Births  2           Allergies  Allergen Reactions   Nsaids Other (See Comments)    Stomach bleeding   Tape Other (See Comments)    Reaction:  Skin blisters and burns. Use paper tape only!!   Keflex [Cephalexin] Other (See Comments)    Reaction:  Bladder spasms   Codeine Hives and Itching   Vicodin  [Hydrocodone-Acetaminophen] Hives and Itching    Social History   Socioeconomic History   Marital status: Single    Spouse name: Not on file   Number of children: 2   Years of education: 11   Highest education level: Not on file  Occupational History   Occupation: Unemployed  Tobacco Use   Smoking status: Every Day    Packs/day: 1.00    Years: 20.00    Pack years: 20.00    Types: Cigarettes   Smokeless tobacco: Never  Vaping Use   Vaping Use: Never used  Substance and Sexual Activity   Alcohol use: No    Alcohol/week: 4.0 standard drinks    Types: 4 Shots of liquor per week    Comment: states has not used in 2 years   Drug use: Yes    Types: Marijuana   Sexual activity: Not Currently    Birth control/protection: None  Other Topics Concern   Not on file  Social History Narrative   Lives with her father and step-mother. Unemployed.   Social Determinants of Health   Financial Resource Strain: Not on file  Food Insecurity: Not on file  Transportation Needs: Not on file  Physical Activity: Not on file  Stress: Not on file  Social Connections: Not on file    Family History  Problem Relation Age of Onset   Colon cancer Paternal Uncle        Passed away 01/23/15   Nephrolithiasis Father    Hypertension Father    Nephrolithiasis Sister    Nephrolithiasis Sister    Nephrolithiasis Sister    Hypertension Mother    Leukemia Mother    Colon cancer Maternal Grandfather    Club foot Son     Medications:       Current Outpatient Medications:    baclofen (LIORESAL) 20 MG tablet, TAKE 1 TABLET (20 MG TOTAL) BY MOUTH 3 (THREE) TIMES DAILY., Disp: 30 each, Rfl: 2   lidocaine (XYLOCAINE) 2 % solution, Use as directed 15 mLs in the mouth or throat as needed for mouth pain., Disp: 100 mL, Rfl: 2   megestrol (MEGACE) 40 MG tablet, TAKE ONE TABLET (40MG TOTAL) BY MOUTH THREE TIMES DAILY UNTIL BLEEDING IS CONTROLLED THEN DAILY, Disp: 45 tablet, Rfl: 2   megestrol (MEGACE) 40 MG  tablet, 3 tablets a day for 5 days, 2 tablets a day for 5 days then 1 tablet daily, Disp: 45 tablet, Rfl: 3   metoCLOPramide (REGLAN) 10 MG tablet, Take 1 tablet (10 mg total) by mouth every 6 (six) hours as needed for nausea or vomiting.,  Disp: 30 tablet, Rfl: 2   norethindrone (MICRONOR) 0.35 MG tablet, TAKE ONE TABLET (0.35 MG TOTAL) BY MOUTHDAILY., Disp: 28 tablet, Rfl: 11   ondansetron (ZOFRAN) 4 MG tablet, Take 4 mg by mouth every 8 (eight) hours as needed for nausea or vomiting., Disp: , Rfl:    pantoprazole (PROTONIX) 40 MG tablet, TAKE ONE TABLET (40 MG TOTAL) BY MOUTH DAILY., Disp: 30 tablet, Rfl: 3   promethazine (PHENERGAN) 12.5 MG tablet, Take 1 tablet (12.5 mg total) by mouth every 6 (six) hours as needed for nausea or vomiting (nausea)., Disp: 30 tablet, Rfl: 1  Objective Blood pressure 131/78, pulse 81, height 5' 2"  (1.575 m), weight 189 lb 8 oz (86 kg).  General WDWN female NAD Vulva:  normal appearing vulva with no masses, tenderness or lesions Vagina:  normal mucosa, no discharge Cervix:  Normal no lesions Uterus:  Grade 2 uterine prolpase, otherwise, normal size, contour, position, consistency, mobility, non-tender Adnexa: ovaries:present,  normal adnexa in size, nontender and no masses   Pertinent ROS No burning with urination, frequency or urgency No nausea, vomiting or diarrhea Nor fever chills or other constitutional symptoms   Labs or studies Reviewed sonogram    Impression Diagnoses this Encounter::   ICD-10-CM   1. Second degree uterine prolapse  N81.2     2. Menorrhagia with irregular cycle  N92.1 POCT hemoglobin    3. Routine cervical smear  Z12.4 Cytology - PAP( )    4. Female dyspareunia, bump, due to uterine prolapse  N94.10       Established relevant diagnosis(es):   Plan/Recommendations: Meds ordered this encounter  Medications   megestrol (MEGACE) 40 MG tablet    Sig: 3 tablets a day for 5 days, 2 tablets a day for 5 days  then 1 tablet daily    Dispense:  45 tablet    Refill:  3    Labs or Scans Ordered: Orders Placed This Encounter  Procedures   POCT hemoglobin    Management:: Recommend [facility] removal of tubes 08/10/21  Follow up Return in about 8 weeks (around 08/18/2021) for [place], with Dr Elonda Husky.      All questions were answered.

## 2021-06-26 LAB — CYTOLOGY - PAP
Adequacy: ABSENT
Chlamydia: NEGATIVE
Comment: NEGATIVE
Comment: NEGATIVE
Comment: NEGATIVE
Comment: NORMAL
Diagnosis: UNDETERMINED — AB
HPV 16: NEGATIVE
HPV 18 / 45: NEGATIVE
High risk HPV: POSITIVE — AB
Neisseria Gonorrhea: NEGATIVE

## 2021-06-28 ENCOUNTER — Ambulatory Visit: Payer: Medicaid Other

## 2021-07-13 ENCOUNTER — Other Ambulatory Visit: Payer: Self-pay | Admitting: Obstetrics and Gynecology

## 2021-07-13 NOTE — Patient Outreach (Signed)
Care Coordination  07/13/2021  Jeanne Mcneil [birth year] [id number]   Medicaid Managed Care   Unsuccessful Outreach Note  07/13/2021 Name: Jeanne Mcneil MRN: [id number] DOB: 1986-04-19  Referred by: Trey Sailors, PA Reason for referral : High Risk Managed Medicaid (Unsuccessful telephone outreach)   An unsuccessful telephone outreach was attempted today. The patient was referred to the case management team for assistance with care management and care coordination.   Follow Up Plan: The care management team will reach out to the patient again over the next 7 days.   Aida Raider RN, BSN [place]  [facility] - Managed Medicaid High Risk [phone].

## 2021-07-13 NOTE — Patient Instructions (Signed)
Hi Jeanne Mcneil, sorry we missed you today - as a part of your Medicaid benefit, you are eligible for care management and care coordination services at no cost or copay. I was unable to reach you by phone today but would be happy to help you with your health related needs. Please feel free to call me at [phone].  A member of the Managed Medicaid care management team will reach out to you again over the next 7 days.   Aida Raider RN, BSN [place]  [facility] - Managed Medicaid High Risk [phone].

## 2021-08-03 NOTE — Patient Instructions (Addendum)
Jeanne Mcneil  08/03/2021     @PREFPERIOPPHARMACY @   Your procedure is scheduled on  08/10/2021.   Report to [facility] at  [phone] A.M.   Call this number if you have problems the morning of surgery:  [phone]   Remember:  Do not eat after midnight.   You may drink clear liquids until  0330 AM .  Clear liquids allowed are:                    Water, Juice (non-citric and without pulp - diabetics please choose diet or no sugar options), Carbonated beverages - (diabetics please choose diet or no sugar options), Clear Tea, Black Coffee only (no creamer, milk or cream including half and half), Plain Jell-O only (diabetics please choose diet or no sugar options), Gatorade (diabetics please choose diet or no sugar options), and Plain Popsicles only      At 0330 Am, drink your carb drink and nothing else to drink after this except the medications instructed by the preoperative staff.    Take these medicines the morning of surgery with A SIP OF WATER       reglan or zofran or phenergan (if needed), protonix.     Do not wear jewelry, make-up or nail polish.  Do not wear lotions, powders, or perfumes, or deodorant.  Do not shave 48 hours prior to surgery.  Men may shave face and neck.  Do not bring valuables to the hospital.  [facility] is not responsible for any belongings or valuables.  Contacts, dentures or bridgework may not be worn into surgery.  Leave your suitcase in the car.  After surgery it may be brought to your room.  For patients admitted to the hospital, discharge time will be determined by your treatment team.  Patients discharged the day of surgery will not be allowed to drive home and must have someone with them for 24 hours.    Special instructions:   DO NOT smoke tobacco or vape for 24 hours before your procedure.  Please read over the following fact sheets that you were given. Coughing and Deep Breathing, Surgical Site Infection Prevention,  Anesthesia Post-op Instructions, and Care and Recovery After Surgery      Vaginal Hysterectomy, Care After The following information offers guidance on how to care for yourself after your procedure. Your health care provider may also give you more specific instructions. If you have problems or questions, contact your health care provider. What can I expect after the procedure? After the procedure, it is common to have: Pain in the lower abdomen and vagina. Vaginal bleeding and discharge for up to 1 week. You will need to use a sanitary pad after this procedure. Difficulty having a bowel movement (constipation). Temporary problems emptying the bladder. Tiredness (fatigue). Poor appetite. Less interest in sex. Feelings of sadness or other emotions. If your ovaries were also removed, it is also common to have symptoms of menopause, such as hot flashes, night sweats, and lack of sleep (insomnia). Follow these instructions at home: Medicines  Take over-the-counter and prescription medicines only as told by your health care provider. Do not take aspirin or NSAIDs, such as ibuprofen. These medicines can cause bleeding. Ask your health care provider if the medicine prescribed to you: Requires you to avoid driving or using machinery. Can cause constipation. You may need to take these actions to prevent or treat constipation: Drink enough fluid to  keep your urine pale yellow. Take over-the-counter or prescription medicines. Eat foods that are high in fiber, such as beans, whole grains, and fresh fruits and vegetables. Limit foods that are high in fat and processed sugars, such as fried or sweet foods. Activity  Rest as told by your health care provider. Return to your normal activities as told by your health care provider. Ask your health care provider what activities are safe for you Avoid sitting for a long time without moving. Get up to take short walks every 1-2 hours. This is important to  improve blood flow and breathing. Ask for help if you feel weak or unsteady. Try to have someone home with you for 1-2 weeks to help you with everyday chores. Do not lift anything that is heavier than 10 lb (4.5 kg), or the limit that you are told, until your health care provider says that it is safe. If you were given a sedative during the procedure, it can affect you for several hours. Do not drive or operate machinery until your health care provider says that it is safe. Lifestyle Do not use any products that contain nicotine or tobacco. These products include cigarettes, chewing tobacco, and vaping devices, such as e-cigarettes. These can delay healing after surgery. If you need help quitting, ask your health care provider. Do not drink alcohol until your health care provider approves. General instructions Do not douche, use tampons, or have sex for at least 6 weeks, or as told by your health care provider. If you struggle with physical or emotional changes after your procedure, speak with your health care provider or a therapist. The stitches inside your vagina will dissolve over time and do not need to be taken out. Do not take baths, swim, or use a hot tub until your health care provider approves. You may only be allowed to take showers for 2-3 weeks Wear compression stockings as told by your health care provider. These stockings help to prevent blood clots and reduce swelling in your legs. Keep all follow-up visits. This is important. Contact a health care provider if: Your pain medicine is not helping. You have a fever. You have nausea or vomiting that does not go away. You feel dizzy. You have blood, pus, or a bad-smelling discharge from your vagina more than 1 week after the procedure. You continue to have trouble urinating 3-5 days after the procedure. Get help right away if: You have severe pain in your abdomen or back. You faint. You have heavy vaginal bleeding and blood clots,  soaking through a sanitary pad in less than 1 hour. You have chest pain or shortness of breath. You have pain, swelling, or redness in your leg. These symptoms may represent a serious problem that is an emergency. Do not wait to see if the symptoms will go away. Get medical help right away. Call your local emergency services (911 in the [place] Do not drive yourself to the hospital. Summary After the procedure, it is common to have pain, vaginal bleeding, constipation, temporary problems emptying your bladder, and feelings of sadness or other emotions. Take over-the-counter and prescription medicines only as told by your health care provider. Rest as told by your health care provider. Return to your normal activities as told by your health care provider. Contact a health care provider if your pain medicine is not helping, or you have a fever, dizziness, or trouble urinating several days after the procedure. Get help right away if you have severe  pain in your abdomen or back, or if you faint, have heavy bleeding, or have chest pain or shortness of breath. This information is not intended to replace advice given to you by your health care provider. Make sure you discuss any questions you have with your health care provider. Document Revised: 07/09/2020 Document Reviewed: 07/09/2020 Elsevier Patient Education  [place] Anesthesia, Adult, Care After This sheet gives you information about how to care for yourself after your procedure. Your health care provider may also give you more specific instructions. If you have problems or questions, contact your health care provider. What can I expect after the procedure? After the procedure, the following side effects are common: Pain or discomfort at the IV site. Nausea. Vomiting. Sore throat. Trouble concentrating. Feeling cold or chills. Feeling weak or tired. Sleepiness and fatigue. Soreness and body aches. These side effects can  affect parts of the body that were not involved in surgery. Follow these instructions at home: For the time period you were told by your health care provider:  Rest. Do not participate in activities where you could fall or become injured. Do not drive or use machinery. Do not drink alcohol. Do not take sleeping pills or medicines that cause drowsiness. Do not make important decisions or sign legal documents. Do not take care of children on your own. Eating and drinking Follow any instructions from your health care provider about eating or drinking restrictions. When you feel hungry, start by eating small amounts of foods that are soft and easy to digest (bland), such as toast. Gradually return to your regular diet. Drink enough fluid to keep your urine pale yellow. If you vomit, rehydrate by drinking water, juice, or clear broth. General instructions If you have sleep apnea, surgery and certain medicines can increase your risk for breathing problems. Follow instructions from your health care provider about wearing your sleep device: Anytime you are sleeping, including during daytime naps. While taking prescription pain medicines, sleeping medicines, or medicines that make you drowsy. Have a responsible adult stay with you for the time you are told. It is important to have someone help care for you until you are awake and alert. Return to your normal activities as told by your health care provider. Ask your health care provider what activities are safe for you. Take over-the-counter and prescription medicines only as told by your health care provider. If you smoke, do not smoke without supervision. Keep all follow-up visits as told by your health care provider. This is important. Contact a health care provider if: You have nausea or vomiting that does not get better with medicine. You cannot eat or drink without vomiting. You have pain that does not get better with medicine. You are  unable to pass urine. You develop a skin rash. You have a fever. You have redness around your IV site that gets worse. Get help right away if: You have difficulty breathing. You have chest pain. You have blood in your urine or stool, or you vomit blood. Summary After the procedure, it is common to have a sore throat or nausea. It is also common to feel tired. Have a responsible adult stay with you for the time you are told. It is important to have someone help care for you until you are awake and alert. When you feel hungry, start by eating small amounts of foods that are soft and easy to digest (bland), such as toast. Gradually return to your regular diet. Drink  enough fluid to keep your urine pale yellow. Return to your normal activities as told by your health care provider. Ask your health care provider what activities are safe for you. This information is not intended to replace advice given to you by your health care provider. Make sure you discuss any questions you have with your health care provider. Document Revised: 07/22/2020 Document Reviewed: 02/19/2020 Elsevier Patient Education  2022 [employer]. How to Use Chlorhexidine for Bathing Chlorhexidine gluconate (CHG) is a germ-killing (antiseptic) solution that is used to clean the skin. It can get rid of the bacteria that normally live on the skin and can keep them away for about 24 hours. To clean your skin with CHG, you may be given: A CHG solution to use in the shower or as part of a sponge bath. A prepackaged cloth that contains CHG. Cleaning your skin with CHG may help lower the risk for infection: While you are staying in the intensive care unit of the hospital. If you have a vascular access, such as a central line, to provide short-term or long-term access to your veins. If you have a catheter to drain urine from your bladder. If you are on a ventilator. A ventilator is a machine that helps you breathe by moving air in and  out of your lungs. After surgery. What are the risks? Risks of using CHG include: A skin reaction. Hearing loss, if CHG gets in your ears and you have a perforated eardrum. Eye injury, if CHG gets in your eyes and is not rinsed out. The CHG product catching fire. Make sure that you avoid smoking and flames after applying CHG to your skin. Do not use CHG: If you have a chlorhexidine allergy or have previously reacted to chlorhexidine. On babies younger than 39 months of age. How to use CHG solution Use CHG only as told by your health care provider, and follow the instructions on the label. Use the full amount of CHG as directed. Usually, this is one bottle. During a shower Follow these steps when using CHG solution during a shower (unless your health care provider gives you different instructions): Start the shower. Use your normal soap and shampoo to wash your face and hair. Turn off the shower or move out of the shower stream. Pour the CHG onto a clean washcloth. Do not use any type of brush or rough-edged sponge. Starting at your neck, lather your body down to your toes. Make sure you follow these instructions: If you will be having surgery, pay special attention to the part of your body where you will be having surgery. Scrub this area for at least 1 minute. Do not use CHG on your head or face. If the solution gets into your ears or eyes, rinse them well with water. Avoid your genital area. Avoid any areas of skin that have broken skin, cuts, or scrapes. Scrub your back and under your arms. Make sure to wash skin folds. Let the lather sit on your skin for 1-2 minutes or as long as told by your health care provider. Thoroughly rinse your entire body in the shower. Make sure that all body creases and crevices are rinsed well. Dry off with a clean towel. Do not put any substances on your body afterward--such as powder, lotion, or perfume--unless you are told to do so by your health care  provider. Only use lotions that are recommended by the manufacturer. Put on clean clothes or pajamas. If it is the night  before your surgery, sleep in clean sheets.  During a sponge bath Follow these steps when using CHG solution during a sponge bath (unless your health care provider gives you different instructions): Use your normal soap and shampoo to wash your face and hair. Pour the CHG onto a clean washcloth. Starting at your neck, lather your body down to your toes. Make sure you follow these instructions: If you will be having surgery, pay special attention to the part of your body where you will be having surgery. Scrub this area for at least 1 minute. Do not use CHG on your head or face. If the solution gets into your ears or eyes, rinse them well with water. Avoid your genital area. Avoid any areas of skin that have broken skin, cuts, or scrapes. Scrub your back and under your arms. Make sure to wash skin folds. Let the lather sit on your skin for 1-2 minutes or as long as told by your health care provider. Using a different clean, wet washcloth, thoroughly rinse your entire body. Make sure that all body creases and crevices are rinsed well. Dry off with a clean towel. Do not put any substances on your body afterward--such as powder, lotion, or perfume--unless you are told to do so by your health care provider. Only use lotions that are recommended by the manufacturer. Put on clean clothes or pajamas. If it is the night before your surgery, sleep in clean sheets. How to use CHG prepackaged cloths Only use CHG cloths as told by your health care provider, and follow the instructions on the label. Use the CHG cloth on clean, dry skin. Do not use the CHG cloth on your head or face unless your health care provider tells you to. When washing with the CHG cloth: Avoid your genital area. Avoid any areas of skin that have broken skin, cuts, or scrapes. Before surgery Follow these steps  when using a CHG cloth to clean before surgery (unless your health care provider gives you different instructions): Using the CHG cloth, vigorously scrub the part of your body where you will be having surgery. Scrub using a back-and-forth motion for 3 minutes. The area on your body should be completely wet with CHG when you are done scrubbing. Do not rinse. Discard the cloth and let the area air-dry. Do not put any substances on the area afterward, such as powder, lotion, or perfume. Put on clean clothes or pajamas. If it is the night before your surgery, sleep in clean sheets.  For general bathing Follow these steps when using CHG cloths for general bathing (unless your health care provider gives you different instructions). Use a separate CHG cloth for each area of your body. Make sure you wash between any folds of skin and between your fingers and toes. Wash your body in the following order, switching to a new cloth after each step: The front of your neck, shoulders, and chest. Both of your arms, under your arms, and your hands. Your stomach and groin area, avoiding the genitals. Your right leg and foot. Your left leg and foot. The back of your neck, your back, and your buttocks. Do not rinse. Discard the cloth and let the area air-dry. Do not put any substances on your body afterward--such as powder, lotion, or perfume--unless you are told to do so by your health care provider. Only use lotions that are recommended by the manufacturer. Put on clean clothes or pajamas. Contact a health care provider if: Your skin  gets irritated after scrubbing. You have questions about using your solution or cloth. You swallow any chlorhexidine. Call your local poison control center (1-8174669846 in the [place] Get help right away if: Your eyes itch badly, or they become very red or swollen. Your skin itches badly and is red or swollen. Your hearing changes. You have trouble seeing. You have swelling or  tingling in your mouth or throat. You have trouble breathing. These symptoms may represent a serious problem that is an emergency. Do not wait to see if the symptoms will go away. Get medical help right away. Call your local emergency services (911 in the [place] Do not drive yourself to the hospital. Summary Chlorhexidine gluconate (CHG) is a germ-killing (antiseptic) solution that is used to clean the skin. Cleaning your skin with CHG may help to lower your risk for infection. You may be given CHG to use for bathing. It may be in a bottle or in a prepackaged cloth to use on your skin. Carefully follow your health care provider's instructions and the instructions on the product label. Do not use CHG if you have a chlorhexidine allergy. Contact your health care provider if your skin gets irritated after scrubbing. This information is not intended to replace advice given to you by your health care provider. Make sure you discuss any questions you have with your health care provider. Document Revised: 01/17/2021 Document Reviewed: 01/17/2021 Elsevier Patient Education  2022 [employer].

## 2021-08-05 ENCOUNTER — Encounter (HOSPITAL_COMMUNITY): Payer: Self-pay

## 2021-08-05 ENCOUNTER — Other Ambulatory Visit: Payer: Self-pay | Admitting: Obstetrics & Gynecology

## 2021-08-05 ENCOUNTER — Other Ambulatory Visit: Payer: Self-pay

## 2021-08-05 ENCOUNTER — Encounter (HOSPITAL_COMMUNITY)
Admission: RE | Admit: 2021-08-05 | Discharge: 2021-08-05 | Disposition: A | Discharge status: Home / Self Care | Payer: Medicaid Other | Source: Ambulatory Visit | Attending: Obstetrics & Gynecology | Admitting: Obstetrics & Gynecology

## 2021-08-05 DIAGNOSIS — Z01812 Encounter for preprocedural laboratory examination: Secondary | ICD-10-CM | POA: Insufficient documentation

## 2021-08-05 HISTORY — DX: Ulcerative colitis, unspecified, without complications: K51.90

## 2021-08-05 LAB — RAPID HIV SCREEN (HIV 1/2 AB+AG)
HIV 1/2 Antibodies: NONREACTIVE
HIV-1 P24 Antigen - HIV24: NONREACTIVE

## 2021-08-05 LAB — CBC
HCT: 43.4 % (ref 36.0–46.0)
Hemoglobin: 14.2 g/dL (ref 12.0–15.0)
MCH: 30.6 pg (ref 26.0–34.0)
MCHC: 32.7 g/dL (ref 30.0–36.0)
MCV: 93.5 fL (ref 80.0–100.0)
Platelets: 297 10*3/uL (ref 150–400)
RBC: 4.64 MIL/uL (ref 3.87–5.11)
RDW: 13.1 % (ref 11.5–15.5)
WBC: 8.3 10*3/uL (ref 4.0–10.5)
nRBC: 0 % (ref 0.0–0.2)

## 2021-08-05 LAB — URINALYSIS, ROUTINE W REFLEX MICROSCOPIC
Bilirubin Urine: NEGATIVE
Glucose, UA: NEGATIVE mg/dL
Ketones, ur: NEGATIVE mg/dL
Leukocytes,Ua: NEGATIVE
Nitrite: NEGATIVE
Protein, ur: 30 mg/dL — AB
Specific Gravity, Urine: 1.025 (ref 1.005–1.030)
pH: 6 (ref 5.0–8.0)

## 2021-08-05 LAB — COMPREHENSIVE METABOLIC PANEL
ALT: 23 U/L (ref 0–44)
AST: 20 U/L (ref 15–41)
Albumin: 3.7 g/dL (ref 3.5–5.0)
Alkaline Phosphatase: 69 U/L (ref 38–126)
Anion gap: 10 (ref 5–15)
BUN: 11 mg/dL (ref 6–20)
CO2: 23 mmol/L (ref 22–32)
Calcium: 9.1 mg/dL (ref 8.9–10.3)
Chloride: 105 mmol/L (ref 98–111)
Creatinine, Ser: 0.78 mg/dL (ref 0.44–1.00)
GFR, Estimated: >60 mL/min (ref >60)
Glucose, Bld: 155 mg/dL — ABNORMAL HIGH (ref 70–99)
Potassium: 3.7 mmol/L (ref 3.5–5.1)
Sodium: 138 mmol/L (ref 135–145)
Total Bilirubin: 0.4 mg/dL (ref 0.3–1.2)
Total Protein: 6.6 g/dL (ref 6.5–8.1)

## 2021-08-05 LAB — HCG, QUANTITATIVE, PREGNANCY: hCG, Beta Chain, Quant, S: <1 m[IU]/mL (ref <5)

## 2021-08-05 LAB — TYPE AND SCREEN
ABO/RH(D): A POS
Antibody Screen: NEGATIVE

## 2021-08-10 ENCOUNTER — Ambulatory Visit (HOSPITAL_COMMUNITY): Payer: Medicaid Other | Admitting: Anesthesiology

## 2021-08-10 ENCOUNTER — Encounter (HOSPITAL_COMMUNITY): Payer: Self-pay | Admitting: Obstetrics & Gynecology

## 2021-08-10 ENCOUNTER — Encounter (HOSPITAL_COMMUNITY)
Admission: RE | Disposition: A | Discharge status: Home / Self Care | Payer: Self-pay | Source: Home / Self Care | Attending: Obstetrics & Gynecology

## 2021-08-10 ENCOUNTER — Ambulatory Visit (HOSPITAL_COMMUNITY)
Admission: RE | Admit: 2021-08-10 | Discharge: 2021-08-10 | Disposition: A | Discharge status: Home / Self Care | Payer: Medicaid Other | Attending: Obstetrics & Gynecology | Admitting: Obstetrics & Gynecology

## 2021-08-10 DIAGNOSIS — N814 Uterovaginal prolapse, unspecified: Secondary | ICD-10-CM

## 2021-08-10 DIAGNOSIS — N920 Excessive and frequent menstruation with regular cycle: Secondary | ICD-10-CM

## 2021-08-10 DIAGNOSIS — Z888 Allergy status to other drugs, medicaments and biological substances status: Secondary | ICD-10-CM | POA: Insufficient documentation

## 2021-08-10 DIAGNOSIS — D260 Other benign neoplasm of cervix uteri: Secondary | ICD-10-CM | POA: Diagnosis not present

## 2021-08-10 DIAGNOSIS — N941 Unspecified dyspareunia: Secondary | ICD-10-CM | POA: Diagnosis not present

## 2021-08-10 DIAGNOSIS — N840 Polyp of corpus uteri: Secondary | ICD-10-CM | POA: Insufficient documentation

## 2021-08-10 DIAGNOSIS — Z885 Allergy status to narcotic agent status: Secondary | ICD-10-CM | POA: Insufficient documentation

## 2021-08-10 DIAGNOSIS — D251 Intramural leiomyoma of uterus: Secondary | ICD-10-CM | POA: Insufficient documentation

## 2021-08-10 DIAGNOSIS — N812 Incomplete uterovaginal prolapse: Secondary | ICD-10-CM | POA: Diagnosis not present

## 2021-08-10 DIAGNOSIS — N870 Mild cervical dysplasia: Secondary | ICD-10-CM

## 2021-08-10 DIAGNOSIS — Z800 Family history of malignant neoplasm of digestive organs: Secondary | ICD-10-CM | POA: Diagnosis not present

## 2021-08-10 DIAGNOSIS — Z881 Allergy status to other antibiotic agents status: Secondary | ICD-10-CM | POA: Diagnosis not present

## 2021-08-10 DIAGNOSIS — Z87891 Personal history of nicotine dependence: Secondary | ICD-10-CM | POA: Insufficient documentation

## 2021-08-10 DIAGNOSIS — N9419 Other specified dyspareunia: Secondary | ICD-10-CM | POA: Diagnosis not present

## 2021-08-10 DIAGNOSIS — Z886 Allergy status to analgesic agent status: Secondary | ICD-10-CM | POA: Insufficient documentation

## 2021-08-10 DIAGNOSIS — Z806 Family history of leukemia: Secondary | ICD-10-CM | POA: Insufficient documentation

## 2021-08-10 DIAGNOSIS — D269 Other benign neoplasm of uterus, unspecified: Secondary | ICD-10-CM | POA: Diagnosis not present

## 2021-08-10 HISTORY — PX: VAGINAL HYSTERECTOMY: SHX2639

## 2021-08-10 SURGERY — HYSTERECTOMY, VAGINAL
Anesthesia: General | Site: Vagina | Laterality: Bilateral

## 2021-08-10 MED ORDER — POVIDONE-IODINE 10 % EX SWAB: 2.0000 "application " | Freq: Once | CUTANEOUS | Status: DC

## 2021-08-10 MED ORDER — LIDOCAINE HCL (CARDIAC) PF 50 MG/5ML IV SOSY
PREFILLED_SYRINGE | INTRAVENOUS | Status: DC | PRN
  Administered 2021-08-10: 80 mg via INTRAVENOUS

## 2021-08-10 MED ORDER — OXYCODONE HCL 5 MG PO TABS
5.0000 mg | ORAL_TABLET | ORAL | Status: DC | PRN
Start: 2021-08-10 — End: 2021-08-10
  Administered 2021-08-10: 5 mg via ORAL
  Filled 2021-08-10: qty 1

## 2021-08-10 MED ORDER — CIPROFLOXACIN HCL 500 MG PO TABS
500.0000 mg | ORAL_TABLET | Freq: Two times a day (BID) | ORAL | 0 refills | Status: DC
Start: 2021-08-10 — End: 2024-01-09

## 2021-08-10 MED ORDER — ROCURONIUM 10MG/ML (10ML) SYRINGE FOR MEDFUSION PUMP - OPTIME
INTRAVENOUS | Status: DC | PRN
  Administered 2021-08-10: 10 mg via INTRAVENOUS
  Administered 2021-08-10: 50 mg via INTRAVENOUS

## 2021-08-10 MED ORDER — ONDANSETRON 8 MG PO TBDP: 8.0000 mg | ORAL_TABLET | Freq: Three times a day (TID) | ORAL | 0 refills | Status: DC | PRN

## 2021-08-10 MED ORDER — LIDOCAINE HCL (PF) 2 % IJ SOLN
INTRAMUSCULAR | Status: AC
  Filled 2021-08-10: qty 5

## 2021-08-10 MED ORDER — ORAL CARE MOUTH RINSE: 15.0000 mL | Freq: Once | OROMUCOSAL | Status: AC

## 2021-08-10 MED ORDER — MIDAZOLAM HCL 2 MG/2ML IJ SOLN
INTRAMUSCULAR | Status: AC
  Filled 2021-08-10: qty 2

## 2021-08-10 MED ORDER — ACETAMINOPHEN 10 MG/ML IV SOLN
INTRAVENOUS | Status: AC
  Filled 2021-08-10: qty 100

## 2021-08-10 MED ORDER — PROPOFOL 10 MG/ML IV BOLUS
INTRAVENOUS | Status: AC
  Filled 2021-08-10: qty 40

## 2021-08-10 MED ORDER — CEFAZOLIN SODIUM-DEXTROSE 2-4 GM/100ML-% IV SOLN
2.0000 g | INTRAVENOUS | Status: AC
  Administered 2021-08-10: 2 g via INTRAVENOUS

## 2021-08-10 MED ORDER — CHLORHEXIDINE GLUCONATE 0.12 % MT SOLN
15.0000 mL | Freq: Once | OROMUCOSAL | Status: AC
  Administered 2021-08-10: 15 mL via OROMUCOSAL

## 2021-08-10 MED ORDER — MIDAZOLAM HCL 5 MG/5ML IJ SOLN
INTRAMUSCULAR | Status: DC | PRN
  Administered 2021-08-10: 2 mg via INTRAVENOUS

## 2021-08-10 MED ORDER — ONDANSETRON HCL 4 MG/2ML IJ SOLN
INTRAMUSCULAR | Status: AC
  Filled 2021-08-10: qty 4

## 2021-08-10 MED ORDER — EPHEDRINE SULFATE 50 MG/ML IJ SOLN
INTRAMUSCULAR | Status: DC | PRN
  Administered 2021-08-10: 5 mg via INTRAVENOUS

## 2021-08-10 MED ORDER — OXYCODONE-ACETAMINOPHEN 7.5-325 MG PO TABS: 1.0000 | ORAL_TABLET | Freq: Four times a day (QID) | ORAL | 0 refills | Status: DC | PRN

## 2021-08-10 MED ORDER — KETOROLAC TROMETHAMINE 30 MG/ML IJ SOLN: 30.0000 mg | Freq: Once | INTRAMUSCULAR | Status: DC

## 2021-08-10 MED ORDER — PHENYLEPHRINE 40 MCG/ML (10ML) SYRINGE FOR IV PUSH (FOR BLOOD PRESSURE SUPPORT)
PREFILLED_SYRINGE | INTRAVENOUS | Status: AC
  Filled 2021-08-10: qty 30

## 2021-08-10 MED ORDER — FENTANYL CITRATE (PF) 100 MCG/2ML IJ SOLN
INTRAMUSCULAR | Status: DC | PRN
  Administered 2021-08-10 (×6): 50 ug via INTRAVENOUS

## 2021-08-10 MED ORDER — SUGAMMADEX SODIUM 500 MG/5ML IV SOLN
INTRAVENOUS | Status: AC
  Filled 2021-08-10: qty 5

## 2021-08-10 MED ORDER — CEFAZOLIN SODIUM-DEXTROSE 2-4 GM/100ML-% IV SOLN
INTRAVENOUS | Status: AC
  Filled 2021-08-10: qty 100

## 2021-08-10 MED ORDER — ONDANSETRON HCL 4 MG/2ML IJ SOLN
4.0000 mg | Freq: Once | INTRAMUSCULAR | Status: DC | PRN
  Filled 2021-08-10: qty 2

## 2021-08-10 MED ORDER — BUPIVACAINE-EPINEPHRINE (PF) 0.5% -1:200000 IJ SOLN
INTRAMUSCULAR | Status: DC | PRN
  Administered 2021-08-10: 20 mL via PERINEURAL

## 2021-08-10 MED ORDER — ONDANSETRON HCL 4 MG/2ML IJ SOLN
INTRAMUSCULAR | Status: DC | PRN
  Administered 2021-08-10: 4 mg via INTRAVENOUS

## 2021-08-10 MED ORDER — HYDROMORPHONE HCL 1 MG/ML IJ SOLN
INTRAMUSCULAR | Status: AC
  Filled 2021-08-10: qty 0.5

## 2021-08-10 MED ORDER — FENTANYL CITRATE (PF) 100 MCG/2ML IJ SOLN
INTRAMUSCULAR | Status: AC
  Filled 2021-08-10: qty 2

## 2021-08-10 MED ORDER — BUPIVACAINE-EPINEPHRINE (PF) 0.5% -1:200000 IJ SOLN
INTRAMUSCULAR | Status: AC
  Filled 2021-08-10: qty 30

## 2021-08-10 MED ORDER — ROCURONIUM BROMIDE 10 MG/ML (PF) SYRINGE
PREFILLED_SYRINGE | INTRAVENOUS | Status: AC
  Filled 2021-08-10: qty 10

## 2021-08-10 MED ORDER — HYDROMORPHONE HCL 1 MG/ML IJ SOLN
0.5000 mg | INTRAMUSCULAR | Status: DC | PRN
  Administered 2021-08-10: 0.5 mg via INTRAVENOUS

## 2021-08-10 MED ORDER — FENTANYL CITRATE PF 50 MCG/ML IJ SOSY
50.0000 ug | PREFILLED_SYRINGE | INTRAMUSCULAR | Status: DC | PRN
Start: 2021-08-10 — End: 2021-08-10

## 2021-08-10 MED ORDER — LACTATED RINGERS IV SOLN: INTRAVENOUS | Status: DC

## 2021-08-10 MED ORDER — PROMETHAZINE HCL 25 MG/ML IJ SOLN
6.2500 mg | INTRAMUSCULAR | Status: AC | PRN
  Administered 2021-08-10 (×2): 12.5 mg via INTRAVENOUS

## 2021-08-10 MED ORDER — GLYCOPYRROLATE PF 0.2 MG/ML IJ SOSY
PREFILLED_SYRINGE | INTRAMUSCULAR | Status: AC
  Filled 2021-08-10: qty 1

## 2021-08-10 MED ORDER — 0.9 % SODIUM CHLORIDE (POUR BTL) OPTIME
TOPICAL | Status: DC | PRN
  Administered 2021-08-10: 1000 mL

## 2021-08-10 MED ORDER — METOCLOPRAMIDE HCL 5 MG/ML IJ SOLN
10.0000 mg | Freq: Once | INTRAMUSCULAR | Status: AC
  Administered 2021-08-10: 10 mg via INTRAVENOUS
  Filled 2021-08-10: qty 2

## 2021-08-10 MED ORDER — PROMETHAZINE HCL 25 MG/ML IJ SOLN
INTRAMUSCULAR | Status: AC
  Filled 2021-08-10: qty 1

## 2021-08-10 MED ORDER — PROMETHAZINE HCL 25 MG PO TABS: 25.0000 mg | ORAL_TABLET | Freq: Four times a day (QID) | ORAL | 1 refills | Status: DC | PRN

## 2021-08-10 MED ORDER — PROPOFOL 10 MG/ML IV BOLUS
INTRAVENOUS | Status: DC | PRN
  Administered 2021-08-10: 260 mg via INTRAVENOUS

## 2021-08-10 MED ORDER — FENTANYL CITRATE PF 50 MCG/ML IJ SOSY
25.0000 ug | PREFILLED_SYRINGE | INTRAMUSCULAR | Status: DC | PRN
  Administered 2021-08-10 (×3): 50 ug via INTRAVENOUS
  Filled 2021-08-10 (×3): qty 1

## 2021-08-10 MED ORDER — SODIUM CHLORIDE 0.9 % IR SOLN
Status: DC | PRN
  Administered 2021-08-10: 3000 mL

## 2021-08-10 MED ORDER — DEXAMETHASONE SODIUM PHOSPHATE 10 MG/ML IJ SOLN
INTRAMUSCULAR | Status: AC
  Filled 2021-08-10: qty 1

## 2021-08-10 MED ORDER — EPHEDRINE 5 MG/ML INJ
INTRAVENOUS | Status: AC
  Filled 2021-08-10: qty 5

## 2021-08-10 MED ORDER — ACETAMINOPHEN 10 MG/ML IV SOLN
1000.0000 mg | Freq: Once | INTRAVENOUS | Status: AC
  Administered 2021-08-10: 1000 mg via INTRAVENOUS

## 2021-08-10 MED ORDER — FENTANYL CITRATE (PF) 250 MCG/5ML IJ SOLN
INTRAMUSCULAR | Status: AC
  Filled 2021-08-10: qty 5

## 2021-08-10 SURGICAL SUPPLY — 39 items
APPLIER CLIP 13 LRG OPEN (CLIP)
APR CLP LRG 13 20 CLIP (CLIP)
BAG HAMPER (MISCELLANEOUS) ×2 IMPLANT
BLADE SURG SZ10 CARB STEEL (BLADE) IMPLANT
CLIP APPLIE 13 LRG OPEN (CLIP) IMPLANT
CLOTH BEACON ORANGE TIMEOUT ST (SAFETY) ×2 IMPLANT
COVER LIGHT HANDLE STERIS (MISCELLANEOUS) ×4 IMPLANT
DECANTER SPIKE VIAL GLASS SM (MISCELLANEOUS) ×2 IMPLANT
DRAPE HALF SHEET 40X57 (DRAPES) ×2 IMPLANT
DRAPE STERI URO 9X17 APER PCH (DRAPES) ×2 IMPLANT
ELECT REM PT RETURN 9FT ADLT (ELECTROSURGICAL) ×2
ELECTRODE REM PT RTRN 9FT ADLT (ELECTROSURGICAL) ×1 IMPLANT
GAUZE 4X4 16PLY ~~LOC~~+RFID DBL (SPONGE) ×2 IMPLANT
GLOVE ECLIPSE 8.0 STRL XLNG CF (GLOVE) ×2 IMPLANT
GLOVE SRG 8 PF TXTR STRL LF DI (GLOVE) ×1 IMPLANT
GLOVE SURG UNDER POLY LF SZ7 (GLOVE) ×6 IMPLANT
GLOVE SURG UNDER POLY LF SZ8 (GLOVE) ×2
GOWN STRL REUS W/TWL LRG LVL3 (GOWN DISPOSABLE) ×4 IMPLANT
GOWN STRL REUS W/TWL XL LVL3 (GOWN DISPOSABLE) ×2 IMPLANT
IV NS IRRIG 3000ML ARTHROMATIC (IV SOLUTION) ×2 IMPLANT
KIT BLADEGUARD II DBL (SET/KITS/TRAYS/PACK) ×2 IMPLANT
KIT TURNOVER CYSTO (KITS) ×2 IMPLANT
MANIFOLD NEPTUNE II (INSTRUMENTS) ×2 IMPLANT
NEEDLE HYPO 21X1.5 SAFETY (NEEDLE) ×2 IMPLANT
NS IRRIG 1000ML POUR BTL (IV SOLUTION) ×2 IMPLANT
PACK PERI GYN (CUSTOM PROCEDURE TRAY) ×2 IMPLANT
PAD ARMBOARD 7.5X6 YLW CONV (MISCELLANEOUS) ×2 IMPLANT
SET BASIN LINEN APH (SET/KITS/TRAYS/PACK) ×2 IMPLANT
SPONGE T-LAP 4X18 ~~LOC~~+RFID (SPONGE) ×2 IMPLANT
SUT MNCRL+ AB 3-0 CT1 36 (SUTURE) IMPLANT
SUT MON AB 3-0 SH 27 (SUTURE) IMPLANT
SUT MONOCRYL AB 3-0 CT1 36IN (SUTURE)
SUT VIC AB 0 CT1 27 (SUTURE) ×6
SUT VIC AB 0 CT1 27XCR 8 STRN (SUTURE) ×3 IMPLANT
SYR CONTROL 10ML LL (SYRINGE) ×2 IMPLANT
TRAY FOLEY W/BAG SLVR 16FR (SET/KITS/TRAYS/PACK) ×2
TRAY FOLEY W/BAG SLVR 16FR ST (SET/KITS/TRAYS/PACK) ×1 IMPLANT
VERSALIGHT (MISCELLANEOUS) ×2 IMPLANT
WATER STERILE IRR 1000ML POUR (IV SOLUTION) ×2 IMPLANT

## 2021-08-10 NOTE — Anesthesia Procedure Notes (Signed)
Procedure Name: Intubation Date/Time: 08/10/2021 7:40 AM Performed by: Ollen Bowl, CRNA Pre-anesthesia Checklist: Patient identified, Patient being monitored, Timeout performed, Emergency Drugs available and Suction available Patient Re-evaluated:Patient Re-evaluated prior to induction Oxygen Delivery Method: Circle System Utilized Preoxygenation: Pre-oxygenation with 100% oxygen Induction Type: IV induction Ventilation: Mask ventilation without difficulty Laryngoscope Size: Mac and 3 Grade View: Grade II Tube type: Oral Tube size: 7.0 mm Number of attempts: 1 Airway Equipment and Method: stylet Placement Confirmation: ETT inserted through vocal cords under direct vision, positive ETCO2 and breath sounds checked- equal and bilateral Secured at: 21 cm Tube secured with: Tape Dental Injury: Teeth and Oropharynx as per pre-operative assessment

## 2021-08-10 NOTE — Anesthesia Postprocedure Evaluation (Signed)
Anesthesia Post Note  Patient: Jeanne Mcneil  Procedure(s) Performed: HYSTERECTOMY VAGINAL (Bilateral: Vagina )  Patient location during evaluation: PACU Anesthesia Type: General Level of consciousness: awake and alert Pain management: pain level controlled Vital Signs Assessment: post-procedure vital signs reviewed and stable Respiratory status: spontaneous breathing Cardiovascular status: blood pressure returned to baseline and stable Postop Assessment: adequate PO intake Anesthetic complications: no Comments: Late entry.   No notable events documented.   Last Vitals:  Vitals:   08/10/21 1045 08/10/21 1109  BP: (!) 133/96 (!) 178/99  Pulse: (!) 59 66  Resp: 17   Temp:  36.7 C  SpO2: 97% 97%    Last Pain:  Vitals:   08/10/21 1109  TempSrc: Oral  PainSc: 5                  Aarushi Hemric

## 2021-08-10 NOTE — Anesthesia Preprocedure Evaluation (Signed)
Anesthesia Evaluation  Patient identified by MRN, date of birth, ID band Patient awake    Reviewed: Allergy & Precautions, H&P , NPO status , Patient's Chart, lab work & pertinent test results, reviewed documented beta blocker date and time   Airway Mallampati: II  TM Distance: >3 FB Neck ROM: full    Dental no notable dental hx.    Pulmonary neg pulmonary ROS, Current Smoker and Patient abstained from smoking.,    Pulmonary exam normal breath sounds clear to auscultation       Cardiovascular Exercise Tolerance: Good negative cardio ROS   Rhythm:regular Rate:Normal     Neuro/Psych PSYCHIATRIC DISORDERS Anxiety Depression negative neurological ROS     GI/Hepatic Neg liver ROS, PUD, GERD  Medicated,  Endo/Other  negative endocrine ROS  Renal/GU negative Renal ROS  negative genitourinary   Musculoskeletal   Abdominal   Peds  Hematology  (+) Blood dyscrasia, anemia ,   Anesthesia Other Findings   Reproductive/Obstetrics negative OB ROS                             Anesthesia Physical Anesthesia Plan  ASA: 2  Anesthesia Plan: General and General ETT   Post-op Pain Management:    Induction:   PONV Risk Score and Plan:   Airway Management Planned:   Additional Equipment:   Intra-op Plan:   Post-operative Plan:   Informed Consent: I have reviewed the patients History and Physical, chart, labs and discussed the procedure including the risks, benefits and alternatives for the proposed anesthesia with the patient or authorized representative who has indicated his/her understanding and acceptance.     Dental Advisory Given  Plan Discussed with: CRNA  Anesthesia Plan Comments:         Anesthesia Quick Evaluation

## 2021-08-10 NOTE — Transfer of Care (Signed)
Immediate Anesthesia Transfer of Care Note  Patient: Jeanne Mcneil  Procedure(s) Performed: HYSTERECTOMY VAGINAL (Bilateral: Vagina )  Patient Location: PACU  Anesthesia Type:General  Level of Consciousness: awake, alert  and oriented  Airway & Oxygen Therapy: Patient Spontanous Breathing  Post-op Assessment: Report given to RN  Post vital signs: Reviewed and stable  Last Vitals:  Vitals Value Taken Time  BP 116/73 08/10/21 0937  Temp 36.1 C 08/10/21 0937  Pulse 113 08/10/21 0942  Resp 29 08/10/21 0942  SpO2 93 % 08/10/21 0942  Vitals shown include unvalidated device data.  Last Pain:  Vitals:   08/10/21 0937  TempSrc:   PainSc: 7       Patients Stated Pain Goal: 5 (91/55/02 7142)  Complications: No notable events documented.

## 2021-08-10 NOTE — Op Note (Signed)
Preoperative diagnosis:  1.  Grade 2 uterine prolapse                                         2.  Menrrhagia with regular cycle, controlled on megace                                         3.  Dyspareunia, bump                                           Postoperative diagnosis:  Same as above   Procedure:  Vaginal hysterectomy  Surgeon:  Florian Buff MD  Anesthesia:  General Endotracheal  Findings:  normal uterus tube and ovaries, no abnormalities noted    Description of operation:  The patient was taken from the preoperative area to the operating room in stable condition. She was placed in the sitting position and underwent a spinal anesthetic. Once an adequate level of anesthesia was attained she was placed in the dorsal lithotomy position. Patient was prepped and draped in the usual sterile fashion and a Foley catheter was placed.  A weighted speculum was placed and the cervix was grasped with thyroid tenaculums both anteriorly and posteriorly.  0.5% Marcaine plain was injected in a circumferential fashion about the cervix and the electrocautery unit was used to incise the vagina and push at all cervix.  The posterior cul-de-sac was then entered sharply without difficulty.  The uterosacral ligaments were clamped cut and inspection suture ligated and held.  The cardinal ligaments were then clamped cut transfixion suture ligated and cut. The anterior peritoneum was identified the anterior cul-de-sac was entered sharply without difficulty. The anterior and posterior leaves of the broad ligament were plicated and the uterine vessels were clamped cut and suture ligated. Serial pedicles were taken of the fundus with each pedicle being clamped cut and suture ligated. The utero-ovarian ligaments were crossclamped the uterus was removed and both pedicles were transfixion suture ligated. There was good hemostasis of all the pedicles. The peritoneum was then closed in a pursestring fashion using 3-0  Vicryl. The anterior posterior vagina was closed in interrupted fashion with good resultant hemostasis. Our closure the lower pelvis and vagina were irrigated vigorously.  The sponge needle and instrument counts were correct x 3.  Total blood loss for the procedure was 100 cc.  The patient received 2 g of Ancef preoperatively prophylactically.  She was taken to the recovery room in good stable condition awake alert doing well.  Florian Buff 08/10/2021, 9:42 AM

## 2021-08-10 NOTE — H&P (Signed)
Preoperative History and Physical  Jeanne Mcneil is a 35 y.o. [id number] with No LMP recorded. admitted for a TVH, BS if possible, vault suspension.  Pt has 2 years of bump dyspareunia We have managed her menses with megestrol for 2 years with moderate success Her dyspareunia is due to her uterine prolapse and cervical bumping  PMH:    Past Medical History:  Diagnosis Date   Anxiety    Back pain    Constipation    Depression    Diverticulitis 10/31/2017   GERD (gastroesophageal reflux disease)    Glaucoma of both eyes    Hematuria    History of abnormal cervical Pap smear    History of chronic gastritis    History of kidney stones    Hydronephrosis, right    Renal calculi    bilateral per ct [id number]   Right ureteral stone    Trichimoniasis 09/05/2018   Ulcerative colitis ([place])    Urgency of urination    Uterine fibroid    Uterine polyp    Vaginal Pap smear, abnormal     PSH:     Past Surgical History:  Procedure Laterality Date   CYSTOSCOPY W/ URETERAL STENT PLACEMENT Right 07/04/2016   Procedure: CYSTOSCOPY WITH RIGHT RETROGRADE PYELOGRAM, RIGHT URETERAL STENT PLACEMENT;  Surgeon: Ardis Hughs, MD;  Location: [facility];  Service: Urology;  Laterality: Right;   CYSTOSCOPY WITH RETROGRADE PYELOGRAM, URETEROSCOPY AND STENT PLACEMENT Left 11/28/2007   CYSTOSCOPY WITH RETROGRADE PYELOGRAM, URETEROSCOPY AND STENT PLACEMENT Right 08/02/2016   Procedure: CYSTOSCOPY WITH RIGHT  RETROGRADE PYELOGRAM, URETEROSCOPY,  AND STENT EXCHANGE;  Surgeon: Ardis Hughs, MD;  Location: [facility];  Service: Urology;  Laterality: Right;   ESOPHAGOGASTRODUODENOSCOPY N/A 05/17/2016   Dr. Oneida Alar: LA Grade A esophagitis, mild gastritis s/p biopsy noting reactive gastritis, medium sized hiatal hernia, next EGD WITH PROPOFOL    INDUCED ABORTION  2008/01/26    POb/GynH:      OB History     Gravida  3   Para  2   Term  2   Preterm      AB  1   Living  2      SAB  1    IAB      Ectopic      Multiple      Live Births  2           SH:   Social History   Tobacco Use   Smoking status: Every Day    Packs/day: 1.00    Years: 20.00    Pack years: 20.00    Types: Cigarettes   Smokeless tobacco: Never  Vaping Use   Vaping Use: Never used  Substance Use Topics   Alcohol use: No    Alcohol/week: 4.0 standard drinks    Types: 4 Shots of liquor per week    Comment: states has not used in 2 years   Drug use: Yes    Types: Marijuana    FH:    Family History  Problem Relation Age of Onset   Colon cancer Paternal Uncle        Passed away Jan 25, 2015   Nephrolithiasis Father    Hypertension Father    Nephrolithiasis Sister    Nephrolithiasis Sister    Nephrolithiasis Sister    Hypertension Mother    Leukemia Mother    Colon cancer Maternal Grandfather    Club foot Son      Allergies:  Allergies  Allergen Reactions   Nsaids Other (See Comments)    Stomach bleeding   Tape Other (See Comments)    Reaction:  Skin blisters and burns. Use paper tape only!!   Keflex [Cephalexin] Other (See Comments)    Reaction:  Bladder spasms   Toradol [Ketorolac Tromethamine] Other (See Comments)    Told not to take it due to history of kidney failure   Codeine Hives and Itching   Vicodin [Hydrocodone-Acetaminophen] Hives and Itching    Medications:       Current Facility-Administered Medications:    ceFAZolin (ANCEF) IVPB 2g/100 mL premix, 2 g, Intravenous, On Call to OR, Paulette Rockford, Mertie Clause, MD   lactated ringers infusion, , Intravenous, Continuous, [place], Coralie Keens, MD   povidone-iodine 10 % swab 2 application, 2 application, Topical, Once, Florian Buff, MD  Review of Systems:   Review of Systems  Constitutional: Negative for fever, chills, weight loss, malaise/fatigue and diaphoresis.  HENT: Negative for hearing loss, ear pain, nosebleeds, congestion, sore throat, neck pain, tinnitus and ear discharge.   Eyes: Negative for blurred vision, double  vision, photophobia, pain, discharge and redness.  Respiratory: Negative for cough, hemoptysis, sputum production, shortness of breath, wheezing and stridor.   Cardiovascular: Negative for chest pain, palpitations, orthopnea, claudication, leg swelling and PND.  Gastrointestinal: Positive for abdominal pain. Negative for heartburn, nausea, vomiting, diarrhea, constipation, blood in stool and melena.  Genitourinary: Negative for dysuria, urgency, frequency, hematuria and flank pain.  Musculoskeletal: Negative for myalgias, back pain, joint pain and falls.  Skin: Negative for itching and rash.  Neurological: Negative for dizziness, tingling, tremors, sensory change, speech change, focal weakness, seizures, loss of consciousness, weakness and headaches.  Endo/Heme/Allergies: Negative for environmental allergies and polydipsia. Does not bruise/bleed easily.  Psychiatric/Behavioral: Negative for depression, suicidal ideas, hallucinations, memory loss and substance abuse. The patient is not nervous/anxious and does not have insomnia.      PHYSICAL EXAM:  Blood pressure (!) 126/96, pulse 74, temperature 97.7 F (36.5 C), temperature source Oral, resp. rate 18, SpO2 95 %.    Vitals reviewed. Constitutional: She is oriented to person, place, and time. She appears well-developed and well-nourished.  HENT:  Head: Normocephalic and atraumatic.  Right Ear: External ear normal.  Left Ear: External ear normal.  Nose: Nose normal.  Mouth/Throat: Oropharynx is clear and moist.  Eyes: Conjunctivae and EOM are normal. Pupils are equal, round, and reactive to light. Right eye exhibits no discharge. Left eye exhibits no discharge. No scleral icterus.  Neck: Normal range of motion. Neck supple. No tracheal deviation present. No thyromegaly present.  Cardiovascular: Normal rate, regular rhythm, normal heart sounds and intact distal pulses.  Exam reveals no gallop and no friction rub.   No murmur  heard. Respiratory: Effort normal and breath sounds normal. No respiratory distress. She has no wheezes. She has no rales. She exhibits no tenderness.  GI: Soft. Bowel sounds are normal. She exhibits no distension and no mass. There is tenderness. There is no rebound and no guarding.  Genitourinary:       Vulva is normal without lesions Vagina is pink moist without discharge Cervix normal in appearance and pap is normal Uterus is normal size, contour, position, consistency, mobility, non-tender Grade 2 prolapse Adnexa is negative with normal sized ovaries by sonogram  Musculoskeletal: Normal range of motion. She exhibits no edema and no tenderness.  Neurological: She is alert and oriented to person, place, and time. She has normal reflexes. She displays normal reflexes.  No cranial nerve deficit. She exhibits normal muscle tone. Coordination normal.  Skin: Skin is warm and dry. No rash noted. No erythema. No pallor.  Psychiatric: She has a normal mood and affect. Her behavior is normal. Judgment and thought content normal.    Labs: Results for orders placed or performed during the hospital encounter of 08/05/21 (from the past 336 hour(s))  CBC   Collection Time: 08/05/21 10:36 AM  Result Value Ref Range   WBC 8.3 4.0 - 10.5 K/uL   RBC 4.64 3.87 - 5.11 MIL/uL   Hemoglobin 14.2 12.0 - 15.0 g/dL   HCT 43.4 36.0 - 46.0 %   MCV 93.5 80.0 - 100.0 fL   MCH 30.6 26.0 - 34.0 pg   MCHC 32.7 30.0 - 36.0 g/dL   RDW 13.1 11.5 - 15.5 %   Platelets 297 150 - 400 K/uL   nRBC 0.0 0.0 - 0.2 %  Comprehensive metabolic panel   Collection Time: 08/05/21 10:36 AM  Result Value Ref Range   Sodium 138 135 - 145 mmol/L   Potassium 3.7 3.5 - 5.1 mmol/L   Chloride 105 98 - 111 mmol/L   CO2 23 22 - 32 mmol/L   Glucose, Bld 155 (H) 70 - 99 mg/dL   BUN 11 6 - 20 mg/dL   Creatinine, Ser 0.78 0.44 - 1.00 mg/dL   Calcium 9.1 8.9 - 10.3 mg/dL   Total Protein 6.6 6.5 - 8.1 g/dL   Albumin 3.7 3.5 - 5.0 g/dL    AST 20 15 - 41 U/L   ALT 23 0 - 44 U/L   Alkaline Phosphatase 69 38 - 126 U/L   Total Bilirubin 0.4 0.3 - 1.2 mg/dL   GFR, Estimated >60 >60 mL/min   Anion gap 10 5 - 15  hCG, quantitative, pregnancy   Collection Time: 08/05/21 10:36 AM  Result Value Ref Range   hCG, Beta Chain, Quant, S <1 <5 mIU/mL  Rapid HIV screen (HIV 1/2 Ab+Ag)   Collection Time: 08/05/21 10:36 AM  Result Value Ref Range   HIV-1 P24 Antigen - HIV24 NON REACTIVE NON REACTIVE   HIV 1/2 Antibodies NON REACTIVE NON REACTIVE   Interpretation (HIV Ag Ab)      A non reactive test result means that HIV 1 or HIV 2 antibodies and HIV 1 p24 antigen were not detected in the specimen.  Urinalysis, Routine w reflex microscopic Urine, Clean Catch   Collection Time: 08/05/21 10:36 AM  Result Value Ref Range   Color, Urine YELLOW YELLOW   APPearance HAZY (A) CLEAR   Specific Gravity, Urine 1.025 1.005 - 1.030   pH 6.0 5.0 - 8.0   Glucose, UA NEGATIVE NEGATIVE mg/dL   Hgb urine dipstick MODERATE (A) NEGATIVE   Bilirubin Urine NEGATIVE NEGATIVE   Ketones, ur NEGATIVE NEGATIVE mg/dL   Protein, ur 30 (A) NEGATIVE mg/dL   Nitrite NEGATIVE NEGATIVE   Leukocytes,Ua NEGATIVE NEGATIVE   RBC / HPF 0-5 0 - 5 RBC/hpf   WBC, UA 0-5 0 - 5 WBC/hpf   Bacteria, UA RARE (A) NONE SEEN   Squamous Epithelial / LPF 11-20 0 - 5   Mucus PRESENT   Type and screen   Collection Time: 08/05/21 10:36 AM  Result Value Ref Range   ABO/RH(D) A POS    Antibody Screen NEG    Sample Expiration      08/19/2021,2359 Performed at [facility], [address], [place], [place] [address]     EKG: Orders  placed or performed during the hospital encounter of 08/05/21   EKG 12-Lead   EKG 12-Lead    Imaging Studies: No results found.    Assessment: Menorrhaiga with regular cycle Dyspareunia, bump Uterine prolapse, grade 2  Plan: TVH, bilateral salpingectomy if possible, vaginal vault suspension  Florian Buff 08/10/2021 7:19  AM

## 2021-08-11 ENCOUNTER — Encounter (HOSPITAL_COMMUNITY): Payer: Self-pay | Admitting: Obstetrics & Gynecology

## 2021-08-12 LAB — SURGICAL PATHOLOGY

## 2021-08-18 ENCOUNTER — Ambulatory Visit (INDEPENDENT_AMBULATORY_CARE_PROVIDER_SITE_OTHER): Payer: Medicaid Other | Admitting: Obstetrics & Gynecology

## 2021-08-18 ENCOUNTER — Other Ambulatory Visit: Payer: Self-pay

## 2021-08-18 ENCOUNTER — Encounter: Payer: Self-pay | Admitting: Obstetrics & Gynecology

## 2021-08-18 VITALS — BP 117/74 | HR 79 | Ht 62.0 in | Wt 188.0 lb

## 2021-08-18 DIAGNOSIS — Z9889 Other specified postprocedural states: Secondary | ICD-10-CM

## 2021-08-18 NOTE — Progress Notes (Signed)
  HPI: Patient returns for routine postoperative follow-up having undergone TVH on 08/10/21.  The patient's immediate postoperative recovery has been unremarkable. Since hospital discharge the patient reports no problems.   Current Outpatient Medications: baclofen (LIORESAL) 20 MG tablet, TAKE 1 TABLET (20 MG TOTAL) BY MOUTH 3 (THREE) TIMES DAILY., Disp: 30 each, Rfl: 2 ciprofloxacin (CIPRO) 500 MG tablet, Take 1 tablet (500 mg total) by mouth 2 (two) times daily., Disp: 14 tablet, Rfl: 0 ondansetron (ZOFRAN ODT) 8 MG disintegrating tablet, Take 1 tablet (8 mg total) by mouth every 8 (eight) hours as needed for nausea or vomiting., Disp: 20 tablet, Rfl: 0 oxyCODONE-acetaminophen (PERCOCET) 7.5-325 MG tablet, Take 1-2 tablets by mouth every 6 (six) hours as needed., Disp: 30 tablet, Rfl: 0 pantoprazole (PROTONIX) 40 MG tablet, TAKE ONE TABLET (40 MG TOTAL) BY MOUTH DAILY., Disp: 30 tablet, Rfl: 3 promethazine (PHENERGAN) 25 MG tablet, Take 1 tablet (25 mg total) by mouth every 6 (six) hours as needed for nausea., Disp: 12 tablet, Rfl: 1 lidocaine (XYLOCAINE) 2 % solution, Use as directed 15 mLs in the mouth or throat as needed for mouth pain. (Patient not taking: No sig reported), Disp: 100 mL, Rfl: 2 ondansetron (ZOFRAN) 4 MG tablet, Take 4 mg by mouth every 8 (eight) hours as needed for nausea or vomiting. (Patient not taking: Reported on 08/18/2021), Disp: , Rfl:  promethazine (PHENERGAN) 12.5 MG tablet, Take 1 tablet (12.5 mg total) by mouth every 6 (six) hours as needed for nausea or vomiting (nausea). (Patient not taking: Reported on 08/18/2021), Disp: 30 tablet, Rfl: 1  No current facility-administered medications for this visit.    Blood pressure 117/74, pulse 79, height 5' 2"  (1.575 m), weight 188 lb (85.3 kg), last menstrual period 06/30/2021.  Physical Exam: Vaginal cuff is intact non tender healing well Bimanual no masses or tenderness  Diagnostic  Tests:   Pathology: benign  Impression:     ICD-10-CM   1. S/P [facility] 08/10/21  Z98.890    nomrla post op course        Plan: Nothin gin the vagina     Follow up: Return in about 4 weeks (around 09/15/2021) for [place], with Dr Elonda Husky.    Florian Buff, MD

## 2021-08-24 ENCOUNTER — Ambulatory Visit: Payer: Medicaid Other | Admitting: Urology

## 2021-09-12 ENCOUNTER — Encounter: Payer: Self-pay | Admitting: Obstetrics & Gynecology

## 2021-09-20 ENCOUNTER — Encounter: Payer: Medicaid Other | Admitting: Obstetrics & Gynecology

## 2022-06-23 ENCOUNTER — Encounter (HOSPITAL_COMMUNITY): Payer: Self-pay | Admitting: Emergency Medicine

## 2022-06-23 ENCOUNTER — Emergency Department (HOSPITAL_COMMUNITY)
Admission: EM | Admit: 2022-06-23 | Discharge: 2022-06-23 | Disposition: A | Discharge status: Home / Self Care | Payer: Medicaid Other | Attending: Emergency Medicine | Admitting: Emergency Medicine

## 2022-06-23 ENCOUNTER — Other Ambulatory Visit: Payer: Self-pay

## 2022-06-23 DIAGNOSIS — W57XXXA Bitten or stung by nonvenomous insect and other nonvenomous arthropods, initial encounter: Secondary | ICD-10-CM | POA: Diagnosis not present

## 2022-06-23 DIAGNOSIS — L03116 Cellulitis of left lower limb: Secondary | ICD-10-CM | POA: Diagnosis not present

## 2022-06-23 DIAGNOSIS — S70362A Insect bite (nonvenomous), left thigh, initial encounter: Secondary | ICD-10-CM | POA: Diagnosis present

## 2022-06-23 DIAGNOSIS — Z23 Encounter for immunization: Secondary | ICD-10-CM | POA: Diagnosis not present

## 2022-06-23 MED ORDER — DOXYCYCLINE HYCLATE 100 MG PO CAPS: 100.0000 mg | ORAL_CAPSULE | Freq: Two times a day (BID) | ORAL | 0 refills | Status: DC

## 2022-06-23 MED ORDER — TETANUS-DIPHTH-ACELL PERTUSSIS 5-2.5-18.5 LF-MCG/0.5 IM SUSY
0.5000 mL | PREFILLED_SYRINGE | Freq: Once | INTRAMUSCULAR | Status: AC
  Administered 2022-06-23: 0.5 mL via INTRAMUSCULAR
  Filled 2022-06-23: qty 0.5

## 2022-06-23 MED ORDER — DOXYCYCLINE HYCLATE 100 MG PO TABS
100.0000 mg | ORAL_TABLET | Freq: Once | ORAL | Status: AC
  Administered 2022-06-23: 100 mg via ORAL
  Filled 2022-06-23: qty 1

## 2022-06-23 NOTE — ED Provider Notes (Signed)
[name] EMERGENCY DEPARTMENT Provider Note   CSN: [id number] Arrival date & time: 06/23/22  1802     History  Chief Complaint  Patient presents with   Insect Bite    Jeanne Mcneil is a 36 y.o. female.  36 year old female presents with concern for insect bite to her left posterior thigh which she first noticed within the past day.  Patient states the area was itching and painful, squeezed and there was a trace amount of purulent drainage.  Last tetanus is unknown, questions if she has had a spider bite as she has killed many spiders in her house recently.  Denies history of diabetes.  No other complaints or concerns today.  Reports prior hysterectomy, no possibility of pregnancy.       Home Medications Prior to Admission medications   Medication Sig Start Date End Date Taking? Authorizing Provider  doxycycline (VIBRAMYCIN) 100 MG capsule Take 1 capsule (100 mg total) by mouth 2 (two) times daily. 06/23/22  Yes Tacy Learn, PA-C  baclofen (LIORESAL) 20 MG tablet TAKE 1 TABLET (20 MG TOTAL) BY MOUTH 3 (THREE) TIMES DAILY. 03/17/21   Estill Dooms, NP  ciprofloxacin (CIPRO) 500 MG tablet Take 1 tablet (500 mg total) by mouth 2 (two) times daily. 08/10/21   Florian Buff, MD  lidocaine (XYLOCAINE) 2 % solution Use as directed 15 mLs in the mouth or throat as needed for mouth pain. Patient not taking: No sig reported 02/07/19   Florian Buff, MD  ondansetron (ZOFRAN ODT) 8 MG disintegrating tablet Take 1 tablet (8 mg total) by mouth every 8 (eight) hours as needed for nausea or vomiting. 08/10/21   Florian Buff, MD  ondansetron (ZOFRAN) 4 MG tablet Take 4 mg by mouth every 8 (eight) hours as needed for nausea or vomiting. Patient not taking: Reported on 08/18/2021    [provider]  oxyCODONE-acetaminophen (PERCOCET) 7.5-325 MG tablet Take 1-2 tablets by mouth every 6 (six) hours as needed. 08/10/21   Florian Buff, MD  pantoprazole (PROTONIX) 40 MG tablet TAKE ONE  TABLET (40 MG TOTAL) BY MOUTH DAILY. 05/02/19   Jonnie Kind, MD  promethazine (PHENERGAN) 12.5 MG tablet Take 1 tablet (12.5 mg total) by mouth every 6 (six) hours as needed for nausea or vomiting (nausea). Patient not taking: Reported on 08/18/2021 05/02/19   Jonnie Kind, MD  promethazine (PHENERGAN) 25 MG tablet Take 1 tablet (25 mg total) by mouth every 6 (six) hours as needed for nausea. 08/10/21   Florian Buff, MD      Allergies    Nsaids, Tape, Keflex [cephalexin], Toradol [ketorolac tromethamine], Codeine, and Vicodin [hydrocodone-acetaminophen]    Review of Systems   Review of Systems Negative except as per HPI Physical Exam Updated Vital Signs BP 122/84   Pulse 94   Temp 98.2 F (36.8 C) (Oral)   Resp 18   Ht 5' 2"  (1.575 m)   Wt 85.3 kg   LMP 06/30/2021   SpO2 97%   BMI 34.40 kg/m  Physical Exam Vitals and nursing note reviewed.  Constitutional:      General: She is not in acute distress.    Appearance: She is well-developed. She is not diaphoretic.  HENT:     Head: Normocephalic and atraumatic.  Pulmonary:     Effort: Pulmonary effort is normal.  Musculoskeletal:        General: Tenderness present. No swelling.  Skin:    General: Skin is  warm and dry.     Findings: Erythema present.       Neurological:     Mental Status: She is alert and oriented to person, place, and time.  Psychiatric:        Behavior: Behavior normal.     ED Results / Procedures / Treatments   Labs (all labs ordered are listed, but only abnormal results are displayed) Labs Reviewed - No data to display  EKG None  Radiology No results found.  Procedures Procedures    Medications Ordered in ED Medications  Tdap (BOOSTRIX) injection 0.5 mL (0.5 mLs Intramuscular Given 06/23/22 1928)  doxycycline (VIBRA-TABS) tablet 100 mg (100 mg Oral Given 06/23/22 1928)    ED Course/ Medical Decision Making/ A&P                           Medical Decision  Making Risk Prescription drug management.   36 year old female with concern for spider bite to the left posterior thigh.  Found to have area of mild erythema and induration without fluctuance, drainage, streaking.  Patient is afebrile.  No history of diabetes, denies possibility of pregnancy.  Area concerning for cellulitis, likely secondary to insect bite.  Patient is started on doxycycline, tetanus updated.  Advised to apply warm compresses to area and recheck with PCP on Monday, return precautions given.        Final Clinical Impression(s) / ED Diagnoses Final diagnoses:  Cellulitis of left lower extremity    Rx / DC Orders ED Discharge Orders          Ordered    doxycycline (VIBRAMYCIN) 100 MG capsule  2 times daily        06/23/22 1917              Tacy Learn, PA-C 06/23/22 1929    Noemi Chapel, MD 06/24/22 1121

## 2022-06-23 NOTE — Discharge Instructions (Signed)
Warm compresses to area for 20 minutes at a time. Take antibiotics as prescribed and complete the full course.  Recheck with your PCP Monday if not improving.

## 2022-06-23 NOTE — ED Triage Notes (Signed)
Pt presents with insect bite to posterior upper thigh, red in color, warm to touch x 1 day.

## 2023-04-04 ENCOUNTER — Ambulatory Visit
Admission: EM | Admit: 2023-04-04 | Discharge: 2023-04-04 | Disposition: A | Discharge status: Home / Self Care | Payer: 59

## 2023-04-04 ENCOUNTER — Emergency Department (HOSPITAL_COMMUNITY)
Admission: EM | Admit: 2023-04-04 | Discharge: 2023-04-04 | Discharge status: Against Medical Advice | Payer: 59 | Attending: Emergency Medicine | Admitting: Emergency Medicine

## 2023-04-04 ENCOUNTER — Other Ambulatory Visit: Payer: Self-pay

## 2023-04-04 DIAGNOSIS — Z5321 Procedure and treatment not carried out due to patient leaving prior to being seen by health care provider: Secondary | ICD-10-CM | POA: Insufficient documentation

## 2023-04-04 DIAGNOSIS — R1084 Generalized abdominal pain: Secondary | ICD-10-CM

## 2023-04-04 DIAGNOSIS — R109 Unspecified abdominal pain: Secondary | ICD-10-CM | POA: Diagnosis not present

## 2023-04-04 DIAGNOSIS — K921 Melena: Secondary | ICD-10-CM | POA: Diagnosis not present

## 2023-04-04 DIAGNOSIS — R195 Other fecal abnormalities: Secondary | ICD-10-CM | POA: Diagnosis not present

## 2023-04-04 NOTE — ED Provider Notes (Signed)
[facility]    CSN: [id number] Arrival date & time: 04/04/23  0846      History   Chief Complaint No chief complaint on file.   HPI Jeanne Mcneil is a 37 y.o. female.   Patient presents today with 1 week of sharp abdominal pain in the left side of her abdomen.  Also reports painful bowel movements.  Reports yesterday, she started noticing blood in her bowel movements and bleeding every time she wiped or had a bowel movement.  Reports the pain in her abdomen is constant, currently 6 out of 10 in a sharp, dull pain.  Patient endorses night sweats, nausea without vomiting, decreased appetite, and some constipation.  No urinary symptoms.  Has not taken anything for symptoms so far.  She reports history of diverticulitis and thinks this may be a diverticulitis flare.  Denies recently eating anything but used to flare her diverticulitis such as seeds or nuts.    Past Medical History:  Diagnosis Date   Anxiety    Back pain    Constipation    Depression    Diverticulitis 10/31/2017   GERD (gastroesophageal reflux disease)    Glaucoma of both eyes    Hematuria    History of abnormal cervical Pap smear    History of chronic gastritis    History of kidney stones    Hydronephrosis, right    Renal calculi    bilateral per ct 0902-2017   Right ureteral stone    Trichimoniasis 09/05/2018   Ulcerative colitis (HCC)    Urgency of urination    Uterine fibroid    Uterine polyp    Vaginal Pap smear, abnormal     Patient Active Problem List   Diagnosis Date Noted   Menorrhagia with regular cycle    Dyspareunia, female    Uterus prolapse    Trichimoniasis 09/05/2018   Noncompliance by refusing service= multiple NO SHOW's 05/01/2018   Dehydration 01/01/2018   Lactic acidosis 01/01/2018   SIRS (systemic inflammatory response syndrome) (HCC) 01/01/2018   Hypercalcemia 01/01/2018   ARF (acute renal failure) (HCC) 12/31/2017   Gonorrhea 12/20/2017   AKI (acute kidney  injury) (HCC) 06/13/2017   Hematuria 06/13/2017   Hypomagnesemia 04/29/2017   Cannabinoid hyperemesis syndrome 04/29/2017   Acute colitis 04/29/2017   Abdominal pain    Normochromic normocytic anemia    Nausea & vomiting 04/02/2017   Acute renal failure (HCC) 01/16/2017   Hyponatremia 01/16/2017   Leukocytosis 01/16/2017   Nausea and vomiting 01/15/2017   Chest pain 01/15/2017   Pyelonephritis 10/11/2016   Hypokalemia 10/11/2016   Hyperglycemia 10/11/2016   Elevated blood pressure reading 10/11/2016   Left nephrolithiasis 10/11/2016   Renal colic on left side 10/11/2016   Generalized anxiety disorder 10/11/2016   Nausea with vomiting 07/26/2016   Ureteral stone 07/04/2016   UTI (urinary tract infection) 07/04/2016   Hydronephrosis of right kidney 07/04/2016   GERD (gastroesophageal reflux disease) 07/04/2016   Ureteral calculi 07/04/2016   Abdominal pain, chronic, epigastric    Hematemesis 05/16/2016   Heme + stool 05/16/2016   Abdominal pain, epigastric 05/16/2016    Past Surgical History:  Procedure Laterality Date   CYSTOSCOPY W/ URETERAL STENT PLACEMENT Right 07/04/2016   Procedure: CYSTOSCOPY WITH RIGHT RETROGRADE PYELOGRAM, RIGHT URETERAL STENT PLACEMENT;  Surgeon: Crist Fat, MD;  Location: [facility];  Service: Urology;  Laterality: Right;   CYSTOSCOPY WITH RETROGRADE PYELOGRAM, URETEROSCOPY AND STENT PLACEMENT Left 11/28/2007   CYSTOSCOPY WITH RETROGRADE PYELOGRAM,  URETEROSCOPY AND STENT PLACEMENT Right 08/02/2016   Procedure: CYSTOSCOPY WITH RIGHT  RETROGRADE PYELOGRAM, URETEROSCOPY,  AND STENT EXCHANGE;  Surgeon: Crist Fat, MD;  Location: [facility];  Service: Urology;  Laterality: Right;   ESOPHAGOGASTRODUODENOSCOPY N/A 05/17/2016   Dr. Darrick Penna: LA Grade A esophagitis, mild gastritis s/p biopsy noting reactive gastritis, medium sized hiatal hernia, next EGD WITH PROPOFOL    INDUCED ABORTION  04/26/2008   VAGINAL HYSTERECTOMY Bilateral 08/10/2021    Procedure: HYSTERECTOMY VAGINAL;  Surgeon: Lazaro Arms, MD;  Location: [facility];  Service: Gynecology;  Laterality: Bilateral;    OB History     Gravida  3   Para  2   Term  2   Preterm      AB  1   Living  2      SAB  1   IAB      Ectopic      Multiple      Live Births  2            Home Medications    Prior to Admission medications   Medication Sig Start Date End Date Taking? Authorizing Provider  dicyclomine (BENTYL) 10 MG capsule Take 10 mg by mouth. 02/20/18  Yes [provider]  baclofen (LIORESAL) 20 MG tablet TAKE 1 TABLET (20 MG TOTAL) BY MOUTH 3 (THREE) TIMES DAILY. 03/17/21   Adline Potter, NP  ciprofloxacin (CIPRO) 500 MG tablet Take 1 tablet (500 mg total) by mouth 2 (two) times daily. 08/10/21   Lazaro Arms, MD  doxycycline (VIBRAMYCIN) 100 MG capsule Take 1 capsule (100 mg total) by mouth 2 (two) times daily. 06/23/22   Jeannie Fend, PA-C  lidocaine (XYLOCAINE) 2 % solution Use as directed 15 mLs in the mouth or throat as needed for mouth pain. Patient not taking: Reported on 08/02/2021 02/07/19   Lazaro Arms, MD  ondansetron (ZOFRAN ODT) 8 MG disintegrating tablet Take 1 tablet (8 mg total) by mouth every 8 (eight) hours as needed for nausea or vomiting. 08/10/21   Lazaro Arms, MD  ondansetron (ZOFRAN) 4 MG tablet Take 4 mg by mouth every 8 (eight) hours as needed for nausea or vomiting. Patient not taking: Reported on 08/18/2021    [provider]  oxyCODONE-acetaminophen (PERCOCET) 7.5-325 MG tablet Take 1-2 tablets by mouth every 6 (six) hours as needed. 08/10/21   Lazaro Arms, MD  pantoprazole (PROTONIX) 40 MG tablet TAKE ONE TABLET (40 MG TOTAL) BY MOUTH DAILY. 05/02/19   Tilda Burrow, MD  promethazine (PHENERGAN) 12.5 MG tablet Take 1 tablet (12.5 mg total) by mouth every 6 (six) hours as needed for nausea or vomiting (nausea). Patient not taking: Reported on 08/18/2021 05/02/19   Tilda Burrow, MD   promethazine (PHENERGAN) 25 MG tablet Take 1 tablet (25 mg total) by mouth every 6 (six) hours as needed for nausea. 08/10/21   Lazaro Arms, MD    Family History Family History  Problem Relation Age of Onset   Colon cancer Paternal Uncle        Passed away 04-27-2015   Nephrolithiasis Father    Hypertension Father    Nephrolithiasis Sister    Nephrolithiasis Sister    Nephrolithiasis Sister    Hypertension Mother    Leukemia Mother    Colon cancer Maternal Grandfather    Club foot Son     Social History Social History   Tobacco Use   Smoking status:  Every Day    Packs/day: 1.00    Years: 20.00    Additional pack years: 0.00    Total pack years: 20.00    Types: Cigarettes   Smokeless tobacco: Never  Vaping Use   Vaping Use: Never used  Substance Use Topics   Alcohol use: No    Alcohol/week: 4.0 standard drinks of alcohol    Types: 4 Shots of liquor per week    Comment: states has not used in 2 years   Drug use: Not Currently    Types: Marijuana     Allergies   Nsaids, Tape, Keflex [cephalexin], Toradol [ketorolac tromethamine], Codeine, and Vicodin [hydrocodone-acetaminophen]   Review of Systems Review of Systems Per HPI  Physical Exam Triage Vital Signs ED Triage Vitals  Enc Vitals Group     BP 04/04/23 0854 (!) 143/99     Pulse Rate 04/04/23 0854 76     Resp 04/04/23 0854 15     Temp 04/04/23 0854 98.1 F (36.7 C)     Temp Source 04/04/23 0854 Oral     SpO2 04/04/23 0854 97 %     Weight --      Height --      Head Circumference --      Peak Flow --      Pain Score 04/04/23 0857 6     Pain Loc --      Pain Edu? --      Excl. in GC? --    No data found.  Updated Vital Signs BP (!) 143/99 (BP Location: Right Arm)   Pulse 76   Temp 98.1 F (36.7 C) (Oral)   Resp 15   LMP 06/30/2021   SpO2 97%   Visual Acuity Right Eye Distance:   Left Eye Distance:   Bilateral Distance:    Right Eye Near:   Left Eye Near:    Bilateral Near:      Physical Exam Vitals and nursing note reviewed.  Constitutional:      General: She is not in acute distress.    Appearance: Normal appearance. She is not toxic-appearing.  HENT:     Head: Normocephalic and atraumatic.     Mouth/Throat:     Mouth: Mucous membranes are moist.     Pharynx: Oropharynx is clear.  Eyes:     General: No scleral icterus.    Extraocular Movements: Extraocular movements intact.  Cardiovascular:     Rate and Rhythm: Normal rate and regular rhythm.  Pulmonary:     Effort: Pulmonary effort is normal. No respiratory distress.     Breath sounds: Normal breath sounds. No wheezing, rhonchi or rales.  Abdominal:     General: Abdomen is flat. Bowel sounds are normal. There is no distension.     Palpations: Abdomen is soft.     Tenderness: There is generalized abdominal tenderness. There is guarding.  Skin:    General: Skin is warm and dry.     Capillary Refill: Capillary refill takes less than 2 seconds.     Coloration: Skin is not jaundiced or pale.     Findings: No erythema.  Neurological:     Mental Status: She is alert and oriented to person, place, and time.  Psychiatric:        Behavior: Behavior is cooperative.      UC Treatments / Results  Labs (all labs ordered are listed, but only abnormal results are displayed) Labs Reviewed - No data to display  EKG  Radiology No results found.  Procedures Procedures (including critical care time)  Medications Ordered in UC Medications - No data to display  Initial Impression / Assessment and Plan / UC Course  I have reviewed the triage vital signs and the nursing notes.  Pertinent labs & imaging results that were available during my care of the patient were reviewed by me and considered in my medical decision making (see chart for details).   Patient is well-appearing, normotensive, afebrile, not tachycardic, not tachypneic, oxygenating well on room air.    1. Generalized abdominal pain 2.  Bloody stool I recommended further evaluation and management emergency room given symptoms and exam as patient is guarding Patient is safe to transport via private vehicle at this time  The patient was given the opportunity to ask questions.  All questions answered to their satisfaction.  The patient is in agreement to this plan.    Final Clinical Impressions(s) / UC Diagnoses   Final diagnoses:  Generalized abdominal pain  Bloody stool     Discharge Instructions      Please go directly to the ED for further evaluation and management of your pain and bloody stool    ED Prescriptions   None    PDMP not reviewed this encounter.   Valentino Nose, NP 04/04/23 [phone]

## 2023-04-04 NOTE — ED Triage Notes (Signed)
Pt c/o diverticulitis flare up pt states she has had bleeding since yesterday dark colored blood every time she wipes and sharp pain in her side.

## 2023-04-04 NOTE — Discharge Instructions (Addendum)
Please go directly to the ED for further evaluation and management of your pain and bloody stool

## 2023-04-04 NOTE — ED Notes (Signed)
Patient is being discharged from the Urgent Care and sent to the Emergency Department via POV . Per NP, patient is in need of higher level of care due to abd pain/bloody stool. Patient is aware and verbalizes understanding of plan of care.  Vitals:   04/04/23 0854  BP: (!) 143/99  Pulse: 76  Resp: 15  Temp: 98.1 F (36.7 C)  SpO2: 97%

## 2023-04-04 NOTE — ED Notes (Signed)
Pt walked out and stated "I have been here for over 38 minutes"

## 2023-04-04 NOTE — ED Provider Notes (Signed)
I went to see the patient at 106.  I was advised that patient just left at 101 by the nursing staff.  I called patient and she informed me that she left because nobody had come to check on her.  I gave her the option of just returning to the emergency room, since she had just left and was not too far.  I explained to her that we had a stroke patient that we had to attend to.  Patient however states that she did not feel the need to come back at this point.     Derwood Kaplan, MD 04/04/23 1315

## 2023-04-04 NOTE — ED Triage Notes (Signed)
Pt with abd pain with bloody stools since last night, hx of diverticulitis. Noted blood on the toilet paper with wiping.

## 2023-10-29 ENCOUNTER — Ambulatory Visit: Payer: 59 | Admitting: Obstetrics & Gynecology

## 2024-01-02 ENCOUNTER — Encounter (HOSPITAL_COMMUNITY): Payer: Self-pay

## 2024-01-02 ENCOUNTER — Other Ambulatory Visit: Payer: Self-pay

## 2024-01-02 ENCOUNTER — Emergency Department (HOSPITAL_COMMUNITY): Payer: 59

## 2024-01-02 ENCOUNTER — Emergency Department (HOSPITAL_COMMUNITY)
Admission: EM | Admit: 2024-01-02 | Discharge: 2024-01-02 | Disposition: A | Discharge status: Home / Self Care | Payer: 59

## 2024-01-02 DIAGNOSIS — N281 Cyst of kidney, acquired: Secondary | ICD-10-CM | POA: Diagnosis not present

## 2024-01-02 DIAGNOSIS — R1032 Left lower quadrant pain: Secondary | ICD-10-CM | POA: Diagnosis not present

## 2024-01-02 DIAGNOSIS — R1031 Right lower quadrant pain: Secondary | ICD-10-CM | POA: Diagnosis not present

## 2024-01-02 DIAGNOSIS — Z90710 Acquired absence of both cervix and uterus: Secondary | ICD-10-CM | POA: Diagnosis not present

## 2024-01-02 DIAGNOSIS — K5750 Diverticulosis of both small and large intestine without perforation or abscess without bleeding: Secondary | ICD-10-CM | POA: Diagnosis not present

## 2024-01-02 DIAGNOSIS — R1030 Lower abdominal pain, unspecified: Secondary | ICD-10-CM | POA: Insufficient documentation

## 2024-01-02 LAB — COMPREHENSIVE METABOLIC PANEL
ALT: 20 U/L (ref 0–44)
AST: 23 U/L (ref 15–41)
Albumin: 4.5 g/dL (ref 3.5–5.0)
Alkaline Phosphatase: 58 U/L (ref 38–126)
Anion gap: 14 (ref 5–15)
BUN: 33 mg/dL — ABNORMAL HIGH (ref 6–20)
CO2: 22 mmol/L (ref 22–32)
Calcium: 9.7 mg/dL (ref 8.9–10.3)
Chloride: 99 mmol/L (ref 98–111)
Creatinine, Ser: 1.49 mg/dL — ABNORMAL HIGH (ref 0.44–1.00)
GFR, Estimated: 46 mL/min — ABNORMAL LOW (ref >60)
Glucose, Bld: 139 mg/dL — ABNORMAL HIGH (ref 70–99)
Potassium: 3.2 mmol/L — ABNORMAL LOW (ref 3.5–5.1)
Sodium: 135 mmol/L (ref 135–145)
Total Bilirubin: 0.6 mg/dL (ref 0.0–1.2)
Total Protein: 8.3 g/dL — ABNORMAL HIGH (ref 6.5–8.1)

## 2024-01-02 LAB — URINALYSIS, ROUTINE W REFLEX MICROSCOPIC
Glucose, UA: NEGATIVE mg/dL
Hgb urine dipstick: NEGATIVE
Ketones, ur: 5 mg/dL — AB
Leukocytes,Ua: NEGATIVE
Nitrite: NEGATIVE
Protein, ur: 100 mg/dL — AB
Specific Gravity, Urine: 1.028 (ref 1.005–1.030)
pH: 5 (ref 5.0–8.0)

## 2024-01-02 LAB — CBC
HCT: 48.9 % — ABNORMAL HIGH (ref 36.0–46.0)
Hemoglobin: 16.1 g/dL — ABNORMAL HIGH (ref 12.0–15.0)
MCH: 30.6 pg (ref 26.0–34.0)
MCHC: 32.9 g/dL (ref 30.0–36.0)
MCV: 93 fL (ref 80.0–100.0)
Platelets: 336 10*3/uL (ref 150–400)
RBC: 5.26 MIL/uL — ABNORMAL HIGH (ref 3.87–5.11)
RDW: 13 % (ref 11.5–15.5)
WBC: 12.7 10*3/uL — ABNORMAL HIGH (ref 4.0–10.5)
nRBC: 0 % (ref 0.0–0.2)

## 2024-01-02 LAB — LIPASE, BLOOD: Lipase: 23 U/L (ref 11–51)

## 2024-01-02 LAB — HCG, QUANTITATIVE, PREGNANCY: hCG, Beta Chain, Quant, S: <1 m[IU]/mL (ref <5)

## 2024-01-02 MED ORDER — IOHEXOL 300 MG/ML  SOLN
100.0000 mL | Freq: Once | INTRAMUSCULAR | Status: AC | PRN
  Administered 2024-01-02: 100 mL via INTRAVENOUS

## 2024-01-02 MED ORDER — SODIUM CHLORIDE 0.9 % IV BOLUS
1000.0000 mL | Freq: Once | INTRAVENOUS | Status: AC
  Administered 2024-01-02: 1000 mL via INTRAVENOUS

## 2024-01-02 MED ORDER — MORPHINE SULFATE (PF) 2 MG/ML IV SOLN
2.0000 mg | Freq: Once | INTRAVENOUS | Status: AC
  Administered 2024-01-02: 2 mg via INTRAVENOUS
  Filled 2024-01-02: qty 1

## 2024-01-02 MED ORDER — HYDROMORPHONE HCL 1 MG/ML IJ SOLN
0.5000 mg | Freq: Once | INTRAMUSCULAR | Status: AC
  Administered 2024-01-02: 0.5 mg via INTRAVENOUS
  Filled 2024-01-02: qty 0.5

## 2024-01-02 MED ORDER — ONDANSETRON HCL 4 MG/2ML IJ SOLN
4.0000 mg | Freq: Once | INTRAMUSCULAR | Status: AC
  Administered 2024-01-02: 4 mg via INTRAVENOUS
  Filled 2024-01-02: qty 2

## 2024-01-02 MED ORDER — DICYCLOMINE HCL 20 MG PO TABS: 20.0000 mg | ORAL_TABLET | Freq: Two times a day (BID) | ORAL | 0 refills | Status: DC | PRN

## 2024-01-02 MED ORDER — ONDANSETRON HCL 4 MG PO TABS: 4.0000 mg | ORAL_TABLET | Freq: Four times a day (QID) | ORAL | 0 refills | Status: DC

## 2024-01-02 MED ORDER — OXYCODONE-ACETAMINOPHEN 5-325 MG PO TABS
1.0000 | ORAL_TABLET | Freq: Once | ORAL | Status: AC
  Administered 2024-01-02: 1 via ORAL
  Filled 2024-01-02: qty 1

## 2024-01-02 MED ORDER — POTASSIUM CHLORIDE CRYS ER 20 MEQ PO TBCR
40.0000 meq | EXTENDED_RELEASE_TABLET | Freq: Once | ORAL | Status: AC
  Administered 2024-01-02: 40 meq via ORAL
  Filled 2024-01-02: qty 2

## 2024-01-02 NOTE — ED Triage Notes (Signed)
Pt c/o diverticulitis flare. Pt states she has had a cold that has flared it up. Pt states with flare up she constantly "craves" hot water.

## 2024-01-02 NOTE — Discharge Instructions (Signed)
Please follow-up with your primary doctor.  Please maintain adequate hydration with frequent sips of clear liquids.  Return immediately felt fevers, chills, chest pain, shortness of breath, worsening abdominal pain, inability to drink due to nausea vomiting or you develop any new or worsening symptoms that are concerning to you.

## 2024-01-02 NOTE — ED Provider Notes (Signed)
[place] EMERGENCY DEPARTMENT AT [facility] Provider Note   CSN: [id number] Arrival date & time: 01/02/24  5284     History  Chief Complaint  Patient presents with   Abdominal Pain    Jeanne Mcneil is a 38 y.o. female.  38 year old female presenting emergency department for abdominal pain.  Had nausea vomiting diarrhea x 3 days.  Today with diffuse lower abdomen pain.  Somewhat nauseated still.  Feels like it is her diverticulitis/ulcerative colitis.  No blood in bowel movements.   Abdominal Pain      Home Medications Prior to Admission medications   Medication Sig Start Date End Date Taking? Authorizing Provider  baclofen (LIORESAL) 20 MG tablet TAKE 1 TABLET (20 MG TOTAL) BY MOUTH 3 (THREE) TIMES DAILY. 03/17/21   Adline Potter, NP  ciprofloxacin (CIPRO) 500 MG tablet Take 1 tablet (500 mg total) by mouth 2 (two) times daily. 08/10/21   Lazaro Arms, MD  dicyclomine (BENTYL) 10 MG capsule Take 10 mg by mouth. 02/20/18   [provider]  doxycycline (VIBRAMYCIN) 100 MG capsule Take 1 capsule (100 mg total) by mouth 2 (two) times daily. 06/23/22   Jeannie Fend, PA-C  lidocaine (XYLOCAINE) 2 % solution Use as directed 15 mLs in the mouth or throat as needed for mouth pain. Patient not taking: Reported on 08/02/2021 02/07/19   Lazaro Arms, MD  ondansetron (ZOFRAN ODT) 8 MG disintegrating tablet Take 1 tablet (8 mg total) by mouth every 8 (eight) hours as needed for nausea or vomiting. 08/10/21   Lazaro Arms, MD  ondansetron (ZOFRAN) 4 MG tablet Take 4 mg by mouth every 8 (eight) hours as needed for nausea or vomiting. Patient not taking: Reported on 08/18/2021    [provider]  oxyCODONE-acetaminophen (PERCOCET) 7.5-325 MG tablet Take 1-2 tablets by mouth every 6 (six) hours as needed. 08/10/21   Lazaro Arms, MD  pantoprazole (PROTONIX) 40 MG tablet TAKE ONE TABLET (40 MG TOTAL) BY MOUTH DAILY. 05/02/19   Tilda Burrow, MD   promethazine (PHENERGAN) 12.5 MG tablet Take 1 tablet (12.5 mg total) by mouth every 6 (six) hours as needed for nausea or vomiting (nausea). Patient not taking: Reported on 08/18/2021 05/02/19   Tilda Burrow, MD  promethazine (PHENERGAN) 25 MG tablet Take 1 tablet (25 mg total) by mouth every 6 (six) hours as needed for nausea. 08/10/21   Lazaro Arms, MD      Allergies    Nsaids, Tape, Keflex [cephalexin], Toradol [ketorolac tromethamine], Codeine, and Vicodin [hydrocodone-acetaminophen]    Review of Systems   Review of Systems  Gastrointestinal:  Positive for abdominal pain.    Physical Exam Updated Vital Signs BP 108/78 (BP Location: Right Arm)   Pulse 70   Temp 98.1 F (36.7 C) (Oral)   Resp 16   Ht 5\' 2"  (1.575 m)   Wt 72.6 kg   LMP 06/30/2021   SpO2 97%   BMI 29.26 kg/m  Physical Exam Vitals and nursing note reviewed.  Constitutional:      General: She is not in acute distress.    Appearance: She is not toxic-appearing.  HENT:     Head: Normocephalic.  Cardiovascular:     Rate and Rhythm: Normal rate and regular rhythm.  Abdominal:     General: Abdomen is flat.     Tenderness: There is abdominal tenderness in the right lower quadrant, suprapubic area and left lower quadrant. There is no  guarding or rebound.  Skin:    General: Skin is warm and dry.  Neurological:     General: No focal deficit present.     Mental Status: She is alert.  Psychiatric:        Mood and Affect: Mood normal.        Behavior: Behavior normal.     ED Results / Procedures / Treatments   Labs (all labs ordered are listed, but only abnormal results are displayed) Labs Reviewed  COMPREHENSIVE METABOLIC PANEL - Abnormal; Notable for the following components:      Result Value   Potassium 3.2 (*)    Glucose, Bld 139 (*)    BUN 33 (*)    Creatinine, Ser 1.49 (*)    Total Protein 8.3 (*)    GFR, Estimated 46 (*)    All other components within normal limits  CBC - Abnormal;  Notable for the following components:   WBC 12.7 (*)    RBC 5.26 (*)    Hemoglobin 16.1 (*)    HCT 48.9 (*)    All other components within normal limits  URINALYSIS, ROUTINE W REFLEX MICROSCOPIC - Abnormal; Notable for the following components:   Color, Urine AMBER (*)    APPearance CLOUDY (*)    Bilirubin Urine SMALL (*)    Ketones, ur 5 (*)    Protein, ur 100 (*)    Bacteria, UA RARE (*)    All other components within normal limits  LIPASE, BLOOD  HCG, QUANTITATIVE, PREGNANCY    EKG None  Radiology CT ABDOMEN PELVIS W CONTRAST Result Date: 01/02/2024 CLINICAL DATA:  LLQ abdominal pain RLQ abdominal pain Diverticulitis, complication suspected EXAM: CT ABDOMEN AND PELVIS WITH CONTRAST TECHNIQUE: Multidetector CT imaging of the abdomen and pelvis was performed using the standard protocol following bolus administration of intravenous contrast. RADIATION DOSE REDUCTION: This exam was performed according to the departmental dose-optimization program which includes automated exposure control, adjustment of the mA and/or kV according to patient size and/or use of iterative reconstruction technique. CONTRAST:  OMNIPAQUE IOHEXOL 300 MG/ML  SOLN COMPARISON:  CT abdomen pelvis 04/04/2018 FINDINGS: Lower chest: No acute abnormality. Hepatobiliary: Vague hypodensity along possible limb again suggestive of focal fatty infiltration. No gallstones, gallbladder wall thickening, or pericholecystic fluid. No biliary dilatation. Pancreas: No focal lesion. Normal pancreatic contour. No surrounding inflammatory changes. No main pancreatic ductal dilatation. Spleen: Normal in size without focal abnormality. Adrenals/Urinary Tract: No adrenal nodule bilaterally. Bilateral kidneys enhance symmetrically. Left renal fluid dense lesion suggestive of a simple renal cyst. Simple renal cysts, in the absence of clinically indicated signs/symptoms, require no independent follow-up. Punctate right nephrolithiasis. No  left nephrolithiasis. No ureterolithiasis bilaterally. No hydronephrosis. No hydroureter. The urinary bladder is unremarkable. Stomach/Bowel: Stomach is within normal limits. Second portion of the duodenum diverticula. Few scattered colonic diverticula. No evidence of bowel wall thickening or dilatation. Fatty infiltration Of the walls of the ascending colon suggestive of chronic inflammation or constipation. Appendix appears normal. Vascular/Lymphatic: No abdominal aorta or iliac aneurysm. Mild atherosclerotic plaque of the aorta and its branches. No abdominal, pelvic, or inguinal lymphadenopathy. Reproductive: Status post hysterectomy. No adnexal masses. Other: No intraperitoneal free fluid. No intraperitoneal free gas. No organized fluid collection. Musculoskeletal: No abdominal wall hernia or abnormality. No suspicious lytic or blastic osseous lesions. No acute displaced fracture. L5-S1 intervertebral disc space vacuum phenomenon. Multilevel mild degenerative changes spine. Mild retrolisthesis of L2 on L3. IMPRESSION: 1. Nonobstructive punctate right nephrolithiasis. 2. Second portion of  the duodenum diverticula and colonic diverticulosis with no acute diverticulitis. 3. Status post hysterectomy. Electronically Signed   By: Tish Frederickson M.D.   On: 01/02/2024 13:03    Procedures Procedures    Medications Ordered in ED Medications  sodium chloride 0.9 % bolus 1,000 mL (0 mLs Intravenous Stopped 01/02/24 1050)  ondansetron (ZOFRAN) injection 4 mg (4 mg Intravenous Given 01/02/24 0921)  potassium chloride SA (KLOR-CON M) CR tablet 40 mEq (40 mEq Oral Given 01/02/24 0949)  HYDROmorphone (DILAUDID) injection 0.5 mg (0.5 mg Intravenous Given 01/02/24 0934)  iohexol (OMNIPAQUE) 300 MG/ML solution 100 mL (100 mLs Intravenous Contrast Given 01/02/24 1105)  morphine (PF) 2 MG/ML injection 2 mg (2 mg Intravenous Given 01/02/24 1206)    ED Course/ Medical Decision Making/ A&P Clinical Course as of 01/02/24  1350  Wed Jan 02, 2024  0926 Comprehensive metabolic panel(!) Mild low potassium.  Appears to have AKI.  Receiving IV fluids.  Does not appear to have transaminitis that would suggest hepatobiliary disease. [TY]  0926 CBC(!) Mild leukocytosis.  No fever or tachycardia [TY]  0926 Lipase: 23 Pancreatitis unlikely [TY]  1330 CT ABDOMEN PELVIS W CONTRAST IMPRESSION: 1. Nonobstructive punctate right nephrolithiasis. 2. Second portion of the duodenum diverticula and colonic diverticulosis with no acute diverticulitis. 3. Status post hysterectomy.   [TY]    Clinical Course User Index [TY] Coral Spikes, DO                                 Medical Decision Making 38 year old female presenting emergency department for abdominal pain.  Afebrile nontachycardic community stable.  Did have some tenderness diffusely across lower abdomen.  Per chart review does have a history of ulcerative colitis/diverticulitis.  Given IV Dilaudid for pain with improvement of her symptoms.  Labs largely reassuring, minor elevation in creatinine and low potassium.  Given IV fluids and potassium was repleted.  Has a mild reactionary leukocytosis 12.7.  Low suspicion for systemic infection with no fever or tachycardia.  Lipase normal.  Pancreatitis unlikely.  No transaminitis to suggest hepatobiliary disease.  Given her complex history CT scan obtained; no acute surgical pathology.  She is feeling improved after pain medications.  Discussed follow-up with her gastroenterologist.  Agreeable plan.  Amount and/or Complexity of Data Reviewed External Data Reviewed:     Details: Does have a chart notification, but appears that she seemingly has not been seen much in the emergency department since 2017/2018. Labs: ordered. Decision-making details documented in ED Course. Radiology: ordered. Decision-making details documented in ED Course.  Risk Prescription drug management.         Final Clinical Impression(s) /  ED Diagnoses Final diagnoses:  None    Rx / DC Orders ED Discharge Orders     None         Coral Spikes, DO 01/02/24 1350

## 2024-01-06 ENCOUNTER — Other Ambulatory Visit: Payer: Self-pay

## 2024-01-06 ENCOUNTER — Emergency Department (HOSPITAL_COMMUNITY): Admission: EM | Admit: 2024-01-06 | Discharge: 2024-01-06 | Discharge status: Against Medical Advice | Payer: 59

## 2024-01-06 ENCOUNTER — Emergency Department (HOSPITAL_COMMUNITY)
Admission: EM | Admit: 2024-01-06 | Discharge: 2024-01-06 | Disposition: A | Discharge status: Home / Self Care | Payer: 59 | Attending: Emergency Medicine | Admitting: Emergency Medicine

## 2024-01-06 ENCOUNTER — Emergency Department (HOSPITAL_COMMUNITY): Payer: 59

## 2024-01-06 ENCOUNTER — Encounter (HOSPITAL_COMMUNITY): Payer: Self-pay | Admitting: *Deleted

## 2024-01-06 DIAGNOSIS — D72829 Elevated white blood cell count, unspecified: Secondary | ICD-10-CM | POA: Insufficient documentation

## 2024-01-06 DIAGNOSIS — N3289 Other specified disorders of bladder: Secondary | ICD-10-CM | POA: Diagnosis not present

## 2024-01-06 DIAGNOSIS — R1084 Generalized abdominal pain: Secondary | ICD-10-CM | POA: Insufficient documentation

## 2024-01-06 DIAGNOSIS — K760 Fatty (change of) liver, not elsewhere classified: Secondary | ICD-10-CM | POA: Diagnosis not present

## 2024-01-06 DIAGNOSIS — E876 Hypokalemia: Secondary | ICD-10-CM | POA: Insufficient documentation

## 2024-01-06 DIAGNOSIS — Z79899 Other long term (current) drug therapy: Secondary | ICD-10-CM | POA: Diagnosis not present

## 2024-01-06 DIAGNOSIS — R1031 Right lower quadrant pain: Secondary | ICD-10-CM | POA: Diagnosis not present

## 2024-01-06 DIAGNOSIS — Z90710 Acquired absence of both cervix and uterus: Secondary | ICD-10-CM | POA: Diagnosis not present

## 2024-01-06 LAB — CBC
HCT: 47.3 % — ABNORMAL HIGH (ref 36.0–46.0)
Hemoglobin: 16 g/dL — ABNORMAL HIGH (ref 12.0–15.0)
MCH: 31.4 pg (ref 26.0–34.0)
MCHC: 33.8 g/dL (ref 30.0–36.0)
MCV: 92.9 fL (ref 80.0–100.0)
Platelets: 341 10*3/uL (ref 150–400)
RBC: 5.09 MIL/uL (ref 3.87–5.11)
RDW: 12.4 % (ref 11.5–15.5)
WBC: 19 10*3/uL — ABNORMAL HIGH (ref 4.0–10.5)
nRBC: 0 % (ref 0.0–0.2)

## 2024-01-06 LAB — URINALYSIS, ROUTINE W REFLEX MICROSCOPIC
Bacteria, UA: NONE SEEN
Glucose, UA: NEGATIVE mg/dL
Hgb urine dipstick: NEGATIVE
Ketones, ur: 5 mg/dL — AB
Leukocytes,Ua: NEGATIVE
Nitrite: NEGATIVE
Protein, ur: 100 mg/dL — AB
Specific Gravity, Urine: 1.034 — ABNORMAL HIGH (ref 1.005–1.030)
pH: 5 (ref 5.0–8.0)

## 2024-01-06 LAB — COMPREHENSIVE METABOLIC PANEL
ALT: 40 U/L (ref 0–44)
AST: 26 U/L (ref 15–41)
Albumin: 4.4 g/dL (ref 3.5–5.0)
Alkaline Phosphatase: 61 U/L (ref 38–126)
Anion gap: 14 (ref 5–15)
BUN: 16 mg/dL (ref 6–20)
CO2: 27 mmol/L (ref 22–32)
Calcium: 9.6 mg/dL (ref 8.9–10.3)
Chloride: 96 mmol/L — ABNORMAL LOW (ref 98–111)
Creatinine, Ser: 1.18 mg/dL — ABNORMAL HIGH (ref 0.44–1.00)
GFR, Estimated: >60 mL/min (ref >60)
Glucose, Bld: 116 mg/dL — ABNORMAL HIGH (ref 70–99)
Potassium: 3.2 mmol/L — ABNORMAL LOW (ref 3.5–5.1)
Sodium: 137 mmol/L (ref 135–145)
Total Bilirubin: 0.8 mg/dL (ref 0.0–1.2)
Total Protein: 7.9 g/dL (ref 6.5–8.1)

## 2024-01-06 LAB — LIPASE, BLOOD: Lipase: 26 U/L (ref 11–51)

## 2024-01-06 MED ORDER — DICYCLOMINE HCL 20 MG PO TABS: 20.0000 mg | ORAL_TABLET | Freq: Two times a day (BID) | ORAL | 0 refills | Status: DC | PRN

## 2024-01-06 MED ORDER — MORPHINE SULFATE (PF) 4 MG/ML IV SOLN
4.0000 mg | Freq: Once | INTRAVENOUS | Status: AC
  Administered 2024-01-06: 4 mg via INTRAVENOUS
  Filled 2024-01-06: qty 1

## 2024-01-06 MED ORDER — POTASSIUM CHLORIDE CRYS ER 20 MEQ PO TBCR: 40.0000 meq | EXTENDED_RELEASE_TABLET | Freq: Once | ORAL | Status: DC

## 2024-01-06 MED ORDER — FENTANYL CITRATE PF 50 MCG/ML IJ SOSY
50.0000 ug | PREFILLED_SYRINGE | Freq: Once | INTRAMUSCULAR | Status: AC
  Administered 2024-01-06: 50 ug via INTRAVENOUS
  Filled 2024-01-06: qty 1

## 2024-01-06 MED ORDER — IOHEXOL 300 MG/ML  SOLN
100.0000 mL | Freq: Once | INTRAMUSCULAR | Status: AC | PRN
  Administered 2024-01-06: 100 mL via INTRAVENOUS

## 2024-01-06 MED ORDER — ONDANSETRON HCL 4 MG/2ML IJ SOLN
4.0000 mg | Freq: Once | INTRAMUSCULAR | Status: AC
  Administered 2024-01-06: 4 mg via INTRAVENOUS
  Filled 2024-01-06: qty 2

## 2024-01-06 MED ORDER — ALUM & MAG HYDROXIDE-SIMETH 200-200-20 MG/5ML PO SUSP: 30.0000 mL | Freq: Once | ORAL | Status: DC

## 2024-01-06 MED ORDER — LACTATED RINGERS IV BOLUS
1000.0000 mL | Freq: Once | INTRAVENOUS | Status: AC
  Administered 2024-01-06: 1000 mL via INTRAVENOUS

## 2024-01-06 MED ORDER — METOCLOPRAMIDE HCL 5 MG/ML IJ SOLN
10.0000 mg | Freq: Once | INTRAMUSCULAR | Status: AC
  Administered 2024-01-06: 10 mg via INTRAVENOUS
  Filled 2024-01-06: qty 2

## 2024-01-06 MED ORDER — MORPHINE SULFATE (PF) 4 MG/ML IV SOLN: 4.0000 mg | Freq: Once | INTRAVENOUS | Status: DC

## 2024-01-06 MED ORDER — DIPHENHYDRAMINE HCL 50 MG/ML IJ SOLN
25.0000 mg | Freq: Once | INTRAMUSCULAR | Status: AC
  Administered 2024-01-06: 25 mg via INTRAVENOUS
  Filled 2024-01-06: qty 1

## 2024-01-06 MED ORDER — PROMETHAZINE HCL 12.5 MG PO TABS
12.5000 mg | ORAL_TABLET | Freq: Four times a day (QID) | ORAL | 0 refills | Status: DC | PRN
Start: 2024-01-06 — End: 2024-01-09

## 2024-01-06 MED ORDER — ONDANSETRON 4 MG PO TBDP: 4.0000 mg | ORAL_TABLET | Freq: Four times a day (QID) | ORAL | 0 refills | Status: DC | PRN

## 2024-01-06 NOTE — ED Notes (Signed)
Pt called RN again to ask if the doctor knows she wants to leave.

## 2024-01-06 NOTE — ED Provider Notes (Signed)
[place] EMERGENCY DEPARTMENT AT [facility] Provider Note   CSN: [id number] Arrival date & time: 01/06/24  1049     History  Chief Complaint  Patient presents with   Abdominal Pain    Jeanne Mcneil is a 38 y.o. female.  Patient reports history of ulcerative colitis.  She states she has not been following with GI recently because it was too difficult to make it to [facility].  Has not had a flare in 3 years but states about 2 weeks ago she had a "stomach bug".  She been having pain since then.  She was seen on 2/12 and had a reassuring workup and states she still been having pain and vomiting.  She has been using hot showers constantly but still having pain.    Abdominal Pain      Home Medications Prior to Admission medications   Medication Sig Start Date End Date Taking? Authorizing Provider  ondansetron (ZOFRAN-ODT) 4 MG disintegrating tablet Take 1 tablet (4 mg total) by mouth every 6 (six) hours as needed for nausea or vomiting. 01/06/24  Yes Helma Argyle A, PA-C  promethazine (PHENERGAN) 12.5 MG tablet Take 1 tablet (12.5 mg total) by mouth every 6 (six) hours as needed for nausea or vomiting. 01/06/24  Yes Helder Crisafulli A, PA-C  baclofen (LIORESAL) 20 MG tablet TAKE 1 TABLET (20 MG TOTAL) BY MOUTH 3 (THREE) TIMES DAILY. 03/17/21   Adline Potter, NP  ciprofloxacin (CIPRO) 500 MG tablet Take 1 tablet (500 mg total) by mouth 2 (two) times daily. 08/10/21   Lazaro Arms, MD  dicyclomine (BENTYL) 20 MG tablet Take 1 tablet (20 mg total) by mouth 2 (two) times daily as needed for up to 4 days for spasms. 01/06/24 01/10/24  Carmel Sacramento A, PA-C  doxycycline (VIBRAMYCIN) 100 MG capsule Take 1 capsule (100 mg total) by mouth 2 (two) times daily. 06/23/22   Jeannie Fend, PA-C  lidocaine (XYLOCAINE) 2 % solution Use as directed 15 mLs in the mouth or throat as needed for mouth pain. Patient not taking: Reported on 08/02/2021 02/07/19   Lazaro Arms, MD  ondansetron  (ZOFRAN ODT) 8 MG disintegrating tablet Take 1 tablet (8 mg total) by mouth every 8 (eight) hours as needed for nausea or vomiting. 08/10/21   Lazaro Arms, MD  ondansetron (ZOFRAN) 4 MG tablet Take 1 tablet (4 mg total) by mouth every 6 (six) hours. 01/02/24   Coral Spikes, DO  oxyCODONE-acetaminophen (PERCOCET) 7.5-325 MG tablet Take 1-2 tablets by mouth every 6 (six) hours as needed. 08/10/21   Lazaro Arms, MD  pantoprazole (PROTONIX) 40 MG tablet TAKE ONE TABLET (40 MG TOTAL) BY MOUTH DAILY. 05/02/19   Tilda Burrow, MD      Allergies    Nsaids, Tape, Keflex [cephalexin], Toradol [ketorolac tromethamine], Codeine, and Vicodin [hydrocodone-acetaminophen]    Review of Systems   Review of Systems  Gastrointestinal:  Positive for abdominal pain.    Physical Exam Updated Vital Signs BP (!) 128/97 (BP Location: Right Arm)   Pulse 92   Temp 99 F (37.2 C) (Oral)   Ht 5\' 2"  (1.575 m)   Wt 72.6 kg   LMP 06/30/2021   SpO2 97%   BMI 29.27 kg/m  Physical Exam Vitals and nursing note reviewed.  Constitutional:      General: She is not in acute distress.    Appearance: She is well-developed.  HENT:     Head: Normocephalic  and atraumatic.  Eyes:     Conjunctiva/sclera: Conjunctivae normal.  Cardiovascular:     Rate and Rhythm: Normal rate and regular rhythm.     Heart sounds: No murmur heard. Pulmonary:     Effort: Pulmonary effort is normal. No respiratory distress.     Breath sounds: Normal breath sounds.  Abdominal:     Palpations: Abdomen is soft.     Tenderness: There is generalized abdominal tenderness.  Musculoskeletal:        General: No swelling.     Cervical back: Neck supple.  Skin:    General: Skin is warm and dry.     Capillary Refill: Capillary refill takes less than 2 seconds.  Neurological:     General: No focal deficit present.     Mental Status: She is alert.  Psychiatric:        Mood and Affect: Mood normal.     ED Results / Procedures /  Treatments   Labs (all labs ordered are listed, but only abnormal results are displayed) Labs Reviewed  COMPREHENSIVE METABOLIC PANEL - Abnormal; Notable for the following components:      Result Value   Potassium 3.2 (*)    Chloride 96 (*)    Glucose, Bld 116 (*)    Creatinine, Ser 1.18 (*)    All other components within normal limits  CBC - Abnormal; Notable for the following components:   WBC 19.0 (*)    Hemoglobin 16.0 (*)    HCT 47.3 (*)    All other components within normal limits  URINALYSIS, ROUTINE W REFLEX MICROSCOPIC - Abnormal; Notable for the following components:   Color, Urine AMBER (*)    APPearance HAZY (*)    Specific Gravity, Urine 1.034 (*)    Bilirubin Urine SMALL (*)    Ketones, ur 5 (*)    Protein, ur 100 (*)    All other components within normal limits  LIPASE, BLOOD    EKG None  Radiology CT ABDOMEN PELVIS W CONTRAST Result Date: 01/06/2024 CLINICAL DATA:  Right lower quadrant abdominal pain. EXAM: CT ABDOMEN AND PELVIS WITH CONTRAST TECHNIQUE: Multidetector CT imaging of the abdomen and pelvis was performed using the standard protocol following bolus administration of intravenous contrast. RADIATION DOSE REDUCTION: This exam was performed according to the departmental dose-optimization program which includes automated exposure control, adjustment of the mA and/or kV according to patient size and/or use of iterative reconstruction technique. CONTRAST:  OMNIPAQUE IOHEXOL 300 MG/ML  SOLN COMPARISON:  CT scan abdomen and pelvis from 01/02/2024. FINDINGS: Lower chest: The lung bases are clear. No pleural effusion. The heart is normal in size. No pericardial effusion. Hepatobiliary: The liver is normal in size. Non-cirrhotic configuration. No suspicious mass. These is mild diffuse hepatic steatosis. There is superimposed focal dense fatty infiltration along the falciform ligament attachment site. No intrahepatic or extrahepatic bile duct dilation. No  calcified gallstones. Normal gallbladder wall thickness. No pericholecystic inflammatory changes. Pancreas: Unremarkable. No pancreatic ductal dilatation or surrounding inflammatory changes. Spleen: Within normal limits. No focal lesion. Adrenals/Urinary Tract: Adrenal glands are unremarkable. No suspicious renal mass. There is a simple renal cyst in the medial portion of the left kidney upper pole measuring 1.7 x 2.0 cm. No hydronephrosis. No renal or ureteric calculi. Urinary bladder is under distended, precluding optimal assessment. However, no large mass or stones identified. No perivesical fat stranding. Stomach/Bowel: There is a small diverticulum arising from the second part of duodenum. No disproportionate dilation of the small  or large bowel loops. No evidence of abnormal bowel wall thickening or inflammatory changes. The appendix is unremarkable. Vascular/Lymphatic: No ascites or pneumoperitoneum. No abdominal or pelvic lymphadenopathy, by size criteria. No aneurysmal dilation of the major abdominal arteries. Reproductive: The uterus is surgically absent. No large adnexal mass. Note is made of a dominant follicle in the left ovary measuring 2.4 x 2.8 cm. Unremarkable right ovary. Other: There is a tiny fat containing umbilical hernia. The soft tissues and abdominal wall are otherwise unremarkable. Musculoskeletal: No suspicious osseous lesions. There are mild multilevel degenerative changes in the visualized spine. IMPRESSION: 1. No acute inflammatory process identified within the abdomen or pelvis. Unremarkable appendix. 2. Multiple other nonacute observations, as described above. Electronically Signed   By: Jules Schick M.D.   On: 01/06/2024 15:54    Procedures Procedures    Medications Ordered in ED Medications  alum & mag hydroxide-simeth (MAALOX/MYLANTA) 200-200-20 MG/5ML suspension 30 mL (30 mLs Oral Not Given 01/06/24 1646)  potassium chloride SA (KLOR-CON M) CR tablet 40 mEq (40 mEq Oral  Patient Refused/Not Given 01/06/24 1646)  lactated ringers bolus 1,000 mL (0 mLs Intravenous Stopped 01/06/24 1656)  ondansetron (ZOFRAN) injection 4 mg (4 mg Intravenous Given 01/06/24 1412)  fentaNYL (SUBLIMAZE) injection 50 mcg (50 mcg Intravenous Given 01/06/24 1413)  morphine (PF) 4 MG/ML injection 4 mg (4 mg Intravenous Given 01/06/24 1455)  metoCLOPramide (REGLAN) injection 10 mg (10 mg Intravenous Given 01/06/24 1455)  diphenhydrAMINE (BENADRYL) injection 25 mg (25 mg Intravenous Given 01/06/24 1455)  iohexol (OMNIPAQUE) 300 MG/ML solution 100 mL (100 mLs Intravenous Contrast Given 01/06/24 1503)    ED Course/ Medical Decision Making/ A&P                                 Medical Decision Making DDx: gastritis, gastroenteritis, appendicitis, cholecystitis, diverticulitis, DKA, nephrolithiasis, gastroparesis, other   ED course: Patient here for abdominal pain with nausea vomiting, has not been having diarrhea but states has not had a bowel movement.  Denies blood in her stool, no fevers.  Vital signs here are reassuring.  She was noted on labs to have mild AKI though creatinine improved from visit several days ago.  Mild hypokalemia which was repleted and leukocytosis 19 which is increased from previous.  Due to the continued symptoms with increasing white blood cell count I did order a repeat CT which did not show any acute findings.  Patient got fentanyl without relief and then got morphine and states she still not feeling better, requesting to go home.  She also got Zofran as well as Reglan and Benadryl.  I offered admission for intractable abdominal pain with nausea and vomiting and also offered to call GI to at least set up outpatient appointment.  Patient refused both of these, she states she just wants to go home.  She was asking for pain medication to go home.  Patient had past history of extensive visits for cannabis hyperemesis and chronic pain, I do not feel outpatient narcotics are a good  alternative for this patient so she was given Bentyl, Zofran and Phenergan advised to follow-up closely with GI or come back to the ER if she is feeling worse or changes her mind.  She is competent to make her own decisions.  She is not having any further vomiting so it is reasonable for her to go home  Amount and/or Complexity of Data Reviewed Labs: ordered. Radiology:  ordered.  Risk OTC drugs. Prescription drug management.           Final Clinical Impression(s) / ED Diagnoses Final diagnoses:  Generalized abdominal pain    Rx / DC Orders ED Discharge Orders          Ordered    ondansetron (ZOFRAN-ODT) 4 MG disintegrating tablet  Every 6 hours PRN        01/06/24 1638    promethazine (PHENERGAN) 12.5 MG tablet  Every 6 hours PRN        01/06/24 1638    dicyclomine (BENTYL) 20 MG tablet  2 times daily PRN        01/06/24 1640              Ma Rings, PA-C 01/06/24 Dian Situ, MD 01/07/24 1056

## 2024-01-06 NOTE — ED Notes (Signed)
patient said she is ready to go home because it's already 4 and she's been here all day. Provider notified.

## 2024-01-06 NOTE — ED Notes (Signed)
Pt left prior to updated discharge papers were completed. RN called patient to inform her of the added prescriptions and gastro appt referral info. Patient verbalized understanding of where to pick up meds and wrote down phone number and info for gastro referral. RN informed patient she could come back to the ER for an updated paperwork as well. Pt verbalized understanding.

## 2024-01-06 NOTE — ED Notes (Signed)
 Attempted to call patient, no answer

## 2024-01-06 NOTE — ED Notes (Signed)
Registration informed this nurse that the patient had left. AMA

## 2024-01-06 NOTE — ED Notes (Signed)
Patient called RN while RN giving report over the phone and asking how long it will be to get discharged. RN told patient RN could talk to her when off the phone.

## 2024-01-06 NOTE — ED Notes (Addendum)
Patient discharged. Provider spoke to patient. Paperwork given to patient and reviewed. Pt verbalized understanding. VSS. A+Ox4. Patient ambulated out of the ER with steady, independent gait. IV removed intact without complications.

## 2024-01-06 NOTE — ED Notes (Signed)
Pt came to nursing station to ask how long it will be to get discharged. RN stated paperwork was just submitted one minute ago.

## 2024-01-06 NOTE — ED Triage Notes (Signed)
Pt with abd pain with N/V, denies diarrhea.  + constipation-LBM over 7 days per pt.

## 2024-01-06 NOTE — Discharge Instructions (Addendum)
To the ER today for abdominal pain with nausea and vomiting.  You had a high white blood cell count and your potassium was somewhat low, we gave you IV fluids, your CT scan did not show any abnormal findings.  You are offered admission to the hospital for continued treatment of your pain nausea and vomiting as well as consult with GI but at this time you prefer to go home, given your reassuring CT scan this is reasonable.  I have prescribed nausea medicine.  I also prescribed Bentyl.  Follow-up with a GI doctor.  Come back to the ER for new or worsening symptoms

## 2024-01-06 NOTE — ED Notes (Signed)
Patient being rude to staff verbally and verbalizes disappointment in wait time. Patient stating "this is ridiculous" "you dont care", "I've been waiting over an hour". Patient requesting pain meds. Provider notified of patient requests.

## 2024-01-08 ENCOUNTER — Ambulatory Visit (INDEPENDENT_AMBULATORY_CARE_PROVIDER_SITE_OTHER): Payer: 59 | Admitting: Gastroenterology

## 2024-01-09 ENCOUNTER — Other Ambulatory Visit: Payer: Self-pay

## 2024-01-09 ENCOUNTER — Emergency Department (HOSPITAL_COMMUNITY)
Admission: EM | Admit: 2024-01-09 | Discharge: 2024-01-09 | Discharge status: Against Medical Advice | Payer: 59 | Attending: Family Medicine | Admitting: Family Medicine

## 2024-01-09 ENCOUNTER — Encounter (HOSPITAL_COMMUNITY): Payer: Self-pay | Admitting: Emergency Medicine

## 2024-01-09 DIAGNOSIS — D72829 Elevated white blood cell count, unspecified: Secondary | ICD-10-CM

## 2024-01-09 DIAGNOSIS — Z5321 Procedure and treatment not carried out due to patient leaving prior to being seen by health care provider: Secondary | ICD-10-CM | POA: Insufficient documentation

## 2024-01-09 DIAGNOSIS — Z79899 Other long term (current) drug therapy: Secondary | ICD-10-CM | POA: Diagnosis not present

## 2024-01-09 DIAGNOSIS — F1290 Cannabis use, unspecified, uncomplicated: Secondary | ICD-10-CM | POA: Diagnosis not present

## 2024-01-09 DIAGNOSIS — R1115 Cyclical vomiting syndrome unrelated to migraine: Secondary | ICD-10-CM | POA: Diagnosis present

## 2024-01-09 DIAGNOSIS — Z8719 Personal history of other diseases of the digestive system: Secondary | ICD-10-CM | POA: Diagnosis not present

## 2024-01-09 DIAGNOSIS — R1084 Generalized abdominal pain: Principal | ICD-10-CM | POA: Insufficient documentation

## 2024-01-09 DIAGNOSIS — R933 Abnormal findings on diagnostic imaging of other parts of digestive tract: Secondary | ICD-10-CM | POA: Diagnosis not present

## 2024-01-09 DIAGNOSIS — R109 Unspecified abdominal pain: Secondary | ICD-10-CM | POA: Diagnosis not present

## 2024-01-09 DIAGNOSIS — R112 Nausea with vomiting, unspecified: Secondary | ICD-10-CM | POA: Diagnosis not present

## 2024-01-09 DIAGNOSIS — E669 Obesity, unspecified: Secondary | ICD-10-CM | POA: Diagnosis present

## 2024-01-09 DIAGNOSIS — E876 Hypokalemia: Secondary | ICD-10-CM | POA: Diagnosis not present

## 2024-01-09 DIAGNOSIS — N179 Acute kidney failure, unspecified: Secondary | ICD-10-CM | POA: Diagnosis present

## 2024-01-09 LAB — LIPASE, BLOOD: Lipase: 27 U/L (ref 11–51)

## 2024-01-09 LAB — CBC WITH DIFFERENTIAL/PLATELET
Abs Immature Granulocytes: 0.11 10*3/uL — ABNORMAL HIGH (ref 0.00–0.07)
Basophils Absolute: 0.1 10*3/uL (ref 0.0–0.1)
Basophils Relative: 1 %
Eosinophils Absolute: 0.1 10*3/uL (ref 0.0–0.5)
Eosinophils Relative: 0 %
HCT: 47.5 % — ABNORMAL HIGH (ref 36.0–46.0)
Hemoglobin: 16.4 g/dL — ABNORMAL HIGH (ref 12.0–15.0)
Immature Granulocytes: 1 %
Lymphocytes Relative: 24 %
Lymphs Abs: 3.2 10*3/uL (ref 0.7–4.0)
MCH: 31.7 pg (ref 26.0–34.0)
MCHC: 34.5 g/dL (ref 30.0–36.0)
MCV: 91.7 fL (ref 80.0–100.0)
Monocytes Absolute: 1.3 10*3/uL — ABNORMAL HIGH (ref 0.1–1.0)
Monocytes Relative: 10 %
Neutro Abs: 8.6 10*3/uL — ABNORMAL HIGH (ref 1.7–7.7)
Neutrophils Relative %: 64 %
Platelets: 367 10*3/uL (ref 150–400)
RBC: 5.18 MIL/uL — ABNORMAL HIGH (ref 3.87–5.11)
RDW: 12.4 % (ref 11.5–15.5)
WBC: 13.5 10*3/uL — ABNORMAL HIGH (ref 4.0–10.5)
nRBC: 0 % (ref 0.0–0.2)

## 2024-01-09 LAB — URINALYSIS, ROUTINE W REFLEX MICROSCOPIC
Bilirubin Urine: NEGATIVE
Glucose, UA: NEGATIVE mg/dL
Hgb urine dipstick: NEGATIVE
Ketones, ur: NEGATIVE mg/dL
Leukocytes,Ua: NEGATIVE
Nitrite: NEGATIVE
Protein, ur: 100 mg/dL — AB
Specific Gravity, Urine: 1.029 (ref 1.005–1.030)
pH: 5 (ref 5.0–8.0)

## 2024-01-09 LAB — RAPID URINE DRUG SCREEN, HOSP PERFORMED
Amphetamines: NOT DETECTED
Barbiturates: NOT DETECTED
Benzodiazepines: POSITIVE — AB
Cocaine: NOT DETECTED
Opiates: POSITIVE — AB
Tetrahydrocannabinol: POSITIVE — AB

## 2024-01-09 LAB — COMPREHENSIVE METABOLIC PANEL
ALT: 29 U/L (ref 0–44)
AST: 20 U/L (ref 15–41)
Albumin: 4.6 g/dL (ref 3.5–5.0)
Alkaline Phosphatase: 63 U/L (ref 38–126)
Anion gap: 16 — ABNORMAL HIGH (ref 5–15)
BUN: 25 mg/dL — ABNORMAL HIGH (ref 6–20)
CO2: 29 mmol/L (ref 22–32)
Calcium: 9.6 mg/dL (ref 8.9–10.3)
Chloride: 87 mmol/L — ABNORMAL LOW (ref 98–111)
Creatinine, Ser: 1.87 mg/dL — ABNORMAL HIGH (ref 0.44–1.00)
GFR, Estimated: 35 mL/min — ABNORMAL LOW (ref >60)
Glucose, Bld: 119 mg/dL — ABNORMAL HIGH (ref 70–99)
Potassium: 2.6 mmol/L — CL (ref 3.5–5.1)
Sodium: 132 mmol/L — ABNORMAL LOW (ref 135–145)
Total Bilirubin: 1 mg/dL (ref 0.0–1.2)
Total Protein: 8.2 g/dL — ABNORMAL HIGH (ref 6.5–8.1)

## 2024-01-09 LAB — PREGNANCY, URINE: Preg Test, Ur: NEGATIVE

## 2024-01-09 LAB — MAGNESIUM: Magnesium: 2.1 mg/dL (ref 1.7–2.4)

## 2024-01-09 MED ORDER — POTASSIUM CHLORIDE 10 MEQ/100ML IV SOLN
10.0000 meq | INTRAVENOUS | Status: AC
  Administered 2024-01-09 (×2): 10 meq via INTRAVENOUS
  Filled 2024-01-09 (×2): qty 100

## 2024-01-09 MED ORDER — LORAZEPAM 2 MG/ML IJ SOLN
0.5000 mg | Freq: Once | INTRAMUSCULAR | Status: AC
  Administered 2024-01-09: 0.5 mg via INTRAVENOUS
  Filled 2024-01-09: qty 1

## 2024-01-09 MED ORDER — SODIUM CHLORIDE 0.9% FLUSH: 3.0000 mL | Freq: Two times a day (BID) | INTRAVENOUS | Status: DC

## 2024-01-09 MED ORDER — SODIUM CHLORIDE 0.9 % IV SOLN: INTRAVENOUS | Status: DC | PRN

## 2024-01-09 MED ORDER — PANTOPRAZOLE SODIUM 40 MG IV SOLR
40.0000 mg | Freq: Once | INTRAVENOUS | Status: AC
Start: 2024-01-09 — End: 2024-01-09
  Administered 2024-01-09: 40 mg via INTRAVENOUS
  Filled 2024-01-09: qty 10

## 2024-01-09 MED ORDER — SODIUM CHLORIDE 0.9% FLUSH: 3.0000 mL | INTRAVENOUS | Status: DC | PRN

## 2024-01-09 MED ORDER — POTASSIUM CHLORIDE 10 MEQ/100ML IV SOLN
10.0000 meq | INTRAVENOUS | Status: AC
  Administered 2024-01-09 (×3): 10 meq via INTRAVENOUS
  Filled 2024-01-09 (×3): qty 100

## 2024-01-09 MED ORDER — ACETAMINOPHEN 325 MG PO TABS: 650.0000 mg | ORAL_TABLET | Freq: Four times a day (QID) | ORAL | Status: DC | PRN

## 2024-01-09 MED ORDER — BISACODYL 10 MG RE SUPP: 10.0000 mg | Freq: Every day | RECTAL | Status: DC | PRN

## 2024-01-09 MED ORDER — ACETAMINOPHEN 650 MG RE SUPP: 650.0000 mg | Freq: Four times a day (QID) | RECTAL | Status: DC | PRN

## 2024-01-09 MED ORDER — SODIUM CHLORIDE 0.9 % IV BOLUS
1000.0000 mL | Freq: Once | INTRAVENOUS | Status: AC
  Administered 2024-01-09: 1000 mL via INTRAVENOUS

## 2024-01-09 MED ORDER — ONDANSETRON HCL 4 MG/2ML IJ SOLN
4.0000 mg | Freq: Once | INTRAMUSCULAR | Status: AC
  Administered 2024-01-09: 4 mg via INTRAVENOUS
  Filled 2024-01-09: qty 2

## 2024-01-09 MED ORDER — HYDROMORPHONE HCL 1 MG/ML IJ SOLN
0.5000 mg | Freq: Once | INTRAMUSCULAR | Status: AC
  Administered 2024-01-09: 0.5 mg via INTRAVENOUS
  Filled 2024-01-09: qty 0.5

## 2024-01-09 MED ORDER — POTASSIUM CHLORIDE CRYS ER 20 MEQ PO TBCR
40.0000 meq | EXTENDED_RELEASE_TABLET | Freq: Once | ORAL | Status: AC
  Administered 2024-01-09: 40 meq via ORAL
  Filled 2024-01-09: qty 2

## 2024-01-09 MED ORDER — DICYCLOMINE HCL 10 MG PO CAPS
20.0000 mg | ORAL_CAPSULE | Freq: Two times a day (BID) | ORAL | Status: DC | PRN
  Administered 2024-01-09: 20 mg via ORAL

## 2024-01-09 MED ORDER — TRAZODONE HCL 50 MG PO TABS: 50.0000 mg | ORAL_TABLET | Freq: Every evening | ORAL | Status: DC | PRN

## 2024-01-09 MED ORDER — ONDANSETRON HCL 4 MG/2ML IJ SOLN
4.0000 mg | Freq: Four times a day (QID) | INTRAMUSCULAR | Status: DC | PRN
Start: 2024-01-09 — End: 2024-01-09
  Administered 2024-01-09: 4 mg via INTRAVENOUS
  Filled 2024-01-09: qty 2

## 2024-01-09 MED ORDER — PANTOPRAZOLE SODIUM 40 MG IV SOLR: 40.0000 mg | Freq: Two times a day (BID) | INTRAVENOUS | Status: DC

## 2024-01-09 MED ORDER — ONDANSETRON HCL 4 MG PO TABS: 4.0000 mg | ORAL_TABLET | Freq: Four times a day (QID) | ORAL | Status: DC | PRN

## 2024-01-09 MED ORDER — HYDROXYZINE HCL 50 MG/ML IM SOLN
50.0000 mg | Freq: Once | INTRAMUSCULAR | Status: AC
  Administered 2024-01-09: 50 mg via INTRAMUSCULAR
  Filled 2024-01-09: qty 1

## 2024-01-09 MED ORDER — SILVER SULFADIAZINE 1 % EX CREA
TOPICAL_CREAM | Freq: Two times a day (BID) | CUTANEOUS | Status: DC
  Administered 2024-01-09: 1 via TOPICAL
  Filled 2024-01-09: qty 50

## 2024-01-09 MED ORDER — POTASSIUM CHLORIDE IN NACL 40-0.9 MEQ/L-% IV SOLN
INTRAVENOUS | Status: DC
  Filled 2024-01-09 (×2): qty 1000

## 2024-01-09 MED ORDER — BACLOFEN 10 MG PO TABS: 20.0000 mg | ORAL_TABLET | Freq: Three times a day (TID) | ORAL | Status: DC

## 2024-01-09 MED ORDER — DICYCLOMINE HCL 20 MG PO TABS
20.0000 mg | ORAL_TABLET | Freq: Two times a day (BID) | ORAL | Status: DC | PRN
  Filled 2024-01-09: qty 1

## 2024-01-09 MED ORDER — HEPARIN SODIUM (PORCINE) 5000 UNIT/ML IJ SOLN: 5000.0000 [IU] | Freq: Three times a day (TID) | INTRAMUSCULAR | Status: DC

## 2024-01-09 MED ORDER — POLYETHYLENE GLYCOL 3350 17 G PO PACK: 17.0000 g | PACK | Freq: Every day | ORAL | Status: DC | PRN

## 2024-01-09 MED ORDER — CAPSAICIN 0.075 % EX CREA
TOPICAL_CREAM | Freq: Four times a day (QID) | CUTANEOUS | Status: DC
  Filled 2024-01-09: qty 57

## 2024-01-09 MED ORDER — OXYCODONE-ACETAMINOPHEN 5-325 MG PO TABS
1.0000 | ORAL_TABLET | Freq: Once | ORAL | Status: AC
  Administered 2024-01-09: 1 via ORAL
  Filled 2024-01-09: qty 1
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
## 2024-01-09 NOTE — ED Notes (Signed)
Pt had spoke with Dr. Marisa Severin earlier and got upset, pt decided to leave AMA and understood the risks of leaving. IV removed.  Dr. Marisa Severin made aware of pt's leaving. Pt signed AMA form.  Pt ambulated out of dept with steady gait. Instructed pt to come back to ER if symptoms worsen.
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
## 2024-01-09 NOTE — ED Notes (Signed)
 ED Provider at bedside.
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
## 2024-01-09 NOTE — ED Notes (Signed)
Dr. Rourk at bedside. 

## 2024-01-10 ENCOUNTER — Other Ambulatory Visit: Payer: Self-pay | Admitting: Adult Health

## 2024-01-10 SURGERY — ESOPHAGOGASTRODUODENOSCOPY (EGD) WITH PROPOFOL
Anesthesia: Choice

## 2024-01-14 ENCOUNTER — Emergency Department (HOSPITAL_COMMUNITY): Payer: 59

## 2024-01-14 ENCOUNTER — Inpatient Hospital Stay (HOSPITAL_COMMUNITY)
Admission: EM | Admit: 2024-01-14 | Discharge: 2024-01-17 | DRG: 392 | Disposition: A | Discharge status: Home / Self Care | Payer: 59 | Attending: Internal Medicine | Admitting: Internal Medicine

## 2024-01-14 ENCOUNTER — Other Ambulatory Visit: Payer: Self-pay

## 2024-01-14 ENCOUNTER — Encounter (HOSPITAL_COMMUNITY): Payer: Self-pay

## 2024-01-14 DIAGNOSIS — N179 Acute kidney failure, unspecified: Secondary | ICD-10-CM | POA: Diagnosis present

## 2024-01-14 DIAGNOSIS — Z886 Allergy status to analgesic agent status: Secondary | ICD-10-CM

## 2024-01-14 DIAGNOSIS — Z888 Allergy status to other drugs, medicaments and biological substances status: Secondary | ICD-10-CM

## 2024-01-14 DIAGNOSIS — K811 Chronic cholecystitis: Secondary | ICD-10-CM | POA: Diagnosis present

## 2024-01-14 DIAGNOSIS — R7989 Other specified abnormal findings of blood chemistry: Secondary | ICD-10-CM | POA: Diagnosis present

## 2024-01-14 DIAGNOSIS — R112 Nausea with vomiting, unspecified: Principal | ICD-10-CM | POA: Diagnosis present

## 2024-01-14 DIAGNOSIS — E861 Hypovolemia: Secondary | ICD-10-CM | POA: Diagnosis present

## 2024-01-14 DIAGNOSIS — E876 Hypokalemia: Secondary | ICD-10-CM | POA: Diagnosis present

## 2024-01-14 DIAGNOSIS — F1290 Cannabis use, unspecified, uncomplicated: Secondary | ICD-10-CM | POA: Diagnosis present

## 2024-01-14 DIAGNOSIS — F419 Anxiety disorder, unspecified: Secondary | ICD-10-CM | POA: Diagnosis present

## 2024-01-14 DIAGNOSIS — K838 Other specified diseases of biliary tract: Secondary | ICD-10-CM | POA: Diagnosis not present

## 2024-01-14 DIAGNOSIS — Z560 Unemployment, unspecified: Secondary | ICD-10-CM

## 2024-01-14 DIAGNOSIS — R1011 Right upper quadrant pain: Secondary | ICD-10-CM | POA: Diagnosis not present

## 2024-01-14 DIAGNOSIS — Z91048 Other nonmedicinal substance allergy status: Secondary | ICD-10-CM

## 2024-01-14 DIAGNOSIS — Z79899 Other long term (current) drug therapy: Secondary | ICD-10-CM

## 2024-01-14 DIAGNOSIS — E860 Dehydration: Secondary | ICD-10-CM | POA: Diagnosis present

## 2024-01-14 DIAGNOSIS — F17210 Nicotine dependence, cigarettes, uncomplicated: Secondary | ICD-10-CM | POA: Diagnosis present

## 2024-01-14 DIAGNOSIS — Z8249 Family history of ischemic heart disease and other diseases of the circulatory system: Secondary | ICD-10-CM

## 2024-01-14 DIAGNOSIS — F32A Depression, unspecified: Secondary | ICD-10-CM | POA: Diagnosis present

## 2024-01-14 DIAGNOSIS — R948 Abnormal results of function studies of other organs and systems: Secondary | ICD-10-CM

## 2024-01-14 DIAGNOSIS — K828 Other specified diseases of gallbladder: Secondary | ICD-10-CM | POA: Diagnosis not present

## 2024-01-14 DIAGNOSIS — E871 Hypo-osmolality and hyponatremia: Secondary | ICD-10-CM | POA: Diagnosis present

## 2024-01-14 DIAGNOSIS — Z885 Allergy status to narcotic agent status: Secondary | ICD-10-CM

## 2024-01-14 DIAGNOSIS — D72829 Elevated white blood cell count, unspecified: Secondary | ICD-10-CM | POA: Diagnosis present

## 2024-01-14 DIAGNOSIS — E66811 Obesity, class 1: Secondary | ICD-10-CM | POA: Diagnosis present

## 2024-01-14 DIAGNOSIS — K760 Fatty (change of) liver, not elsewhere classified: Secondary | ICD-10-CM | POA: Diagnosis present

## 2024-01-14 DIAGNOSIS — K219 Gastro-esophageal reflux disease without esophagitis: Secondary | ICD-10-CM | POA: Diagnosis present

## 2024-01-14 DIAGNOSIS — H409 Unspecified glaucoma: Secondary | ICD-10-CM | POA: Diagnosis present

## 2024-01-14 DIAGNOSIS — K2210 Ulcer of esophagus without bleeding: Secondary | ICD-10-CM | POA: Diagnosis present

## 2024-01-14 LAB — COMPREHENSIVE METABOLIC PANEL
ALT: 24 U/L (ref 0–44)
AST: 25 U/L (ref 15–41)
Albumin: 4.3 g/dL (ref 3.5–5.0)
Alkaline Phosphatase: 64 U/L (ref 38–126)
Anion gap: 17 — ABNORMAL HIGH (ref 5–15)
BUN: 15 mg/dL (ref 6–20)
CO2: 28 mmol/L (ref 22–32)
Calcium: 9.7 mg/dL (ref 8.9–10.3)
Chloride: 88 mmol/L — ABNORMAL LOW (ref 98–111)
Creatinine, Ser: 1.87 mg/dL — ABNORMAL HIGH (ref 0.44–1.00)
GFR, Estimated: 35 mL/min — ABNORMAL LOW (ref >60)
Glucose, Bld: 163 mg/dL — ABNORMAL HIGH (ref 70–99)
Potassium: 2.5 mmol/L — CL (ref 3.5–5.1)
Sodium: 133 mmol/L — ABNORMAL LOW (ref 135–145)
Total Bilirubin: 1.1 mg/dL (ref 0.0–1.2)
Total Protein: 7.6 g/dL (ref 6.5–8.1)

## 2024-01-14 LAB — URINALYSIS, ROUTINE W REFLEX MICROSCOPIC
Bilirubin Urine: NEGATIVE
Glucose, UA: NEGATIVE mg/dL
Hgb urine dipstick: NEGATIVE
Ketones, ur: NEGATIVE mg/dL
Leukocytes,Ua: NEGATIVE
Nitrite: NEGATIVE
Protein, ur: 100 mg/dL — AB
Specific Gravity, Urine: 1.021 (ref 1.005–1.030)
pH: 5 (ref 5.0–8.0)

## 2024-01-14 LAB — PROTIME-INR
INR: 1 (ref 0.8–1.2)
Prothrombin Time: 13.8 s (ref 11.4–15.2)

## 2024-01-14 LAB — DIFFERENTIAL
Abs Immature Granulocytes: 0.08 10*3/uL — ABNORMAL HIGH (ref 0.00–0.07)
Basophils Absolute: 0.1 10*3/uL (ref 0.0–0.1)
Basophils Relative: 1 %
Eosinophils Absolute: 0.1 10*3/uL (ref 0.0–0.5)
Eosinophils Relative: 1 %
Immature Granulocytes: 1 %
Lymphocytes Relative: 25 %
Lymphs Abs: 3.6 10*3/uL (ref 0.7–4.0)
Monocytes Absolute: 1.4 10*3/uL — ABNORMAL HIGH (ref 0.1–1.0)
Monocytes Relative: 10 %
Neutro Abs: 9 10*3/uL — ABNORMAL HIGH (ref 1.7–7.7)
Neutrophils Relative %: 62 %

## 2024-01-14 LAB — I-STAT CHEM 8, ED
BUN: 17 mg/dL (ref 6–20)
Calcium, Ion: 0.93 mmol/L — ABNORMAL LOW (ref 1.15–1.40)
Chloride: 88 mmol/L — ABNORMAL LOW (ref 98–111)
Creatinine, Ser: 1.9 mg/dL — ABNORMAL HIGH (ref 0.44–1.00)
Glucose, Bld: 168 mg/dL — ABNORMAL HIGH (ref 70–99)
HCT: 51 % — ABNORMAL HIGH (ref 36.0–46.0)
Hemoglobin: 17.3 g/dL — ABNORMAL HIGH (ref 12.0–15.0)
Potassium: 2.4 mmol/L — CL (ref 3.5–5.1)
Sodium: 130 mmol/L — ABNORMAL LOW (ref 135–145)
TCO2: 30 mmol/L (ref 22–32)

## 2024-01-14 LAB — CBC
HCT: 48 % — ABNORMAL HIGH (ref 36.0–46.0)
Hemoglobin: 16.3 g/dL — ABNORMAL HIGH (ref 12.0–15.0)
MCH: 30.9 pg (ref 26.0–34.0)
MCHC: 34 g/dL (ref 30.0–36.0)
MCV: 91.1 fL (ref 80.0–100.0)
Platelets: 340 10*3/uL (ref 150–400)
RBC: 5.27 MIL/uL — ABNORMAL HIGH (ref 3.87–5.11)
RDW: 12.2 % (ref 11.5–15.5)
WBC: 14.4 10*3/uL — ABNORMAL HIGH (ref 4.0–10.5)
nRBC: 0 % (ref 0.0–0.2)

## 2024-01-14 LAB — CBG MONITORING, ED: Glucose-Capillary: 158 mg/dL — ABNORMAL HIGH (ref 70–99)

## 2024-01-14 LAB — ETHANOL: Alcohol, Ethyl (B): <10 mg/dL (ref <10)

## 2024-01-14 LAB — MAGNESIUM: Magnesium: 1.9 mg/dL (ref 1.7–2.4)

## 2024-01-14 LAB — LIPASE, BLOOD: Lipase: 32 U/L (ref 11–51)

## 2024-01-14 LAB — APTT: aPTT: 28 s (ref 24–36)

## 2024-01-14 MED ORDER — PROCHLORPERAZINE EDISYLATE 10 MG/2ML IJ SOLN
10.0000 mg | Freq: Once | INTRAMUSCULAR | Status: AC
  Administered 2024-01-14: 10 mg via INTRAVENOUS
  Filled 2024-01-14: qty 2

## 2024-01-14 MED ORDER — ONDANSETRON HCL 4 MG/2ML IJ SOLN
4.0000 mg | Freq: Four times a day (QID) | INTRAMUSCULAR | Status: DC
  Administered 2024-01-14 – 2024-01-17 (×11): 4 mg via INTRAVENOUS
  Filled 2024-01-14 (×12): qty 2

## 2024-01-14 MED ORDER — POTASSIUM CHLORIDE CRYS ER 20 MEQ PO TBCR
60.0000 meq | EXTENDED_RELEASE_TABLET | Freq: Once | ORAL | Status: AC
  Administered 2024-01-14: 60 meq via ORAL
  Filled 2024-01-14: qty 3

## 2024-01-14 MED ORDER — METOCLOPRAMIDE HCL 5 MG/ML IJ SOLN
10.0000 mg | Freq: Four times a day (QID) | INTRAMUSCULAR | Status: DC | PRN
  Administered 2024-01-14 – 2024-01-17 (×2): 10 mg via INTRAVENOUS
  Filled 2024-01-14 (×2): qty 2

## 2024-01-14 MED ORDER — SODIUM CHLORIDE 0.9% FLUSH
3.0000 mL | Freq: Once | INTRAVENOUS | Status: AC
  Administered 2024-01-14: 3 mL via INTRAVENOUS

## 2024-01-14 MED ORDER — DIPHENHYDRAMINE HCL 50 MG/ML IJ SOLN
25.0000 mg | Freq: Once | INTRAMUSCULAR | Status: AC
  Administered 2024-01-14: 25 mg via INTRAVENOUS
  Filled 2024-01-14: qty 1

## 2024-01-14 MED ORDER — MORPHINE SULFATE (PF) 2 MG/ML IV SOLN
2.0000 mg | INTRAVENOUS | Status: DC | PRN
  Administered 2024-01-14 – 2024-01-17 (×15): 2 mg via INTRAVENOUS
  Filled 2024-01-14 (×15): qty 1

## 2024-01-14 MED ORDER — ACETAMINOPHEN 650 MG RE SUPP: 650.0000 mg | Freq: Four times a day (QID) | RECTAL | Status: DC | PRN

## 2024-01-14 MED ORDER — DROPERIDOL 2.5 MG/ML IJ SOLN: 1.2500 mg | Freq: Once | INTRAMUSCULAR | Status: DC

## 2024-01-14 MED ORDER — ACETAMINOPHEN 325 MG PO TABS: 650.0000 mg | ORAL_TABLET | Freq: Four times a day (QID) | ORAL | Status: DC | PRN

## 2024-01-14 MED ORDER — POTASSIUM CHLORIDE IN NACL 40-0.9 MEQ/L-% IV SOLN
INTRAVENOUS | Status: AC
  Filled 2024-01-14 (×4): qty 1000

## 2024-01-14 MED ORDER — ALUM & MAG HYDROXIDE-SIMETH 200-200-20 MG/5ML PO SUSP: 30.0000 mL | ORAL | Status: DC | PRN

## 2024-01-14 MED ORDER — HYDROMORPHONE HCL 1 MG/ML IJ SOLN
1.0000 mg | Freq: Once | INTRAMUSCULAR | Status: AC
  Administered 2024-01-14: 1 mg via INTRAVENOUS
  Filled 2024-01-14: qty 1

## 2024-01-14 MED ORDER — PANTOPRAZOLE SODIUM 40 MG IV SOLR
40.0000 mg | Freq: Two times a day (BID) | INTRAVENOUS | Status: DC
  Administered 2024-01-14 – 2024-01-17 (×6): 40 mg via INTRAVENOUS
  Filled 2024-01-14 (×6): qty 10

## 2024-01-14 MED ORDER — POTASSIUM CHLORIDE 10 MEQ/100ML IV SOLN
10.0000 meq | INTRAVENOUS | Status: AC
  Administered 2024-01-14 (×5): 10 meq via INTRAVENOUS
  Filled 2024-01-14 (×5): qty 100

## 2024-01-14 MED ORDER — ONDANSETRON HCL 4 MG/2ML IJ SOLN
4.0000 mg | INTRAMUSCULAR | Status: AC
  Administered 2024-01-14: 4 mg via INTRAMUSCULAR
  Filled 2024-01-14: qty 2

## 2024-01-14 NOTE — ED Provider Notes (Signed)
 [place] EMERGENCY DEPARTMENT AT [facility] Provider Note   CSN: [id number] Arrival date & time: 01/14/24  1132     History  Chief Complaint  Patient presents with   Abdominal Pain   left side numbness    Jeanne Mcneil is a 38 y.o. female.  38 yo F with a significant past medical history of cyclic vomiting syndrome comes in with a chief complaints of uncontrolled nausea vomiting and abdominal pain.  Patient complaining mostly of pain in the left lower quadrant.  No urinary symptoms no fevers.  She thinks it feels slightly different than her normal.   Abdominal Pain      Home Medications Prior to Admission medications   Medication Sig Start Date End Date Taking? Authorizing Provider  baclofen (LIORESAL) 20 MG tablet Take 20 mg by mouth 3 (three) times daily. 01/10/24  Yes [provider]  ondansetron (ZOFRAN) 4 MG tablet Take 1 tablet (4 mg total) by mouth every 6 (six) hours. 01/02/24   Coral Spikes, DO      Allergies    Nsaids, Tape, Keflex [cephalexin], Toradol [ketorolac tromethamine], Codeine, and Vicodin [hydrocodone-acetaminophen]    Review of Systems   Review of Systems  Gastrointestinal:  Positive for abdominal pain.    Physical Exam Updated Vital Signs BP (!) 142/110 (BP Location: Left Arm)   Pulse (!) 106   Temp (!) 97.5 F (36.4 C) (Oral)   Resp 16   Ht 5\' 2"  (1.575 m)   Wt 76.6 kg   LMP 06/30/2021   SpO2 96%   BMI 30.89 kg/m  Physical Exam Vitals and nursing note reviewed.  Constitutional:      General: She is not in acute distress.    Appearance: She is well-developed. She is not diaphoretic.  HENT:     Head: Normocephalic and atraumatic.  Eyes:     Pupils: Pupils are equal, round, and reactive to light.  Cardiovascular:     Rate and Rhythm: Normal rate and regular rhythm.     Heart sounds: No murmur heard.    No friction rub. No gallop.  Pulmonary:     Effort: Pulmonary effort is normal.     Breath sounds: No  wheezing or rales.  Abdominal:     General: There is no distension.     Palpations: Abdomen is soft.     Tenderness: There is abdominal tenderness.     Comments: Mild diffuse abdominal pain  Musculoskeletal:        General: No tenderness.     Cervical back: Normal range of motion and neck supple.  Skin:    General: Skin is warm and dry.  Neurological:     Mental Status: She is alert and oriented to person, place, and time.  Psychiatric:        Behavior: Behavior normal.     ED Results / Procedures / Treatments   Labs (all labs ordered are listed, but only abnormal results are displayed) Labs Reviewed  CBC - Abnormal; Notable for the following components:      Result Value   WBC 14.4 (*)    RBC 5.27 (*)    Hemoglobin 16.3 (*)    HCT 48.0 (*)    All other components within normal limits  DIFFERENTIAL - Abnormal; Notable for the following components:   Neutro Abs 9.0 (*)    Monocytes Absolute 1.4 (*)    Abs Immature Granulocytes 0.08 (*)    All other components within  normal limits  COMPREHENSIVE METABOLIC PANEL - Abnormal; Notable for the following components:   Sodium 133 (*)    Potassium 2.5 (*)    Chloride 88 (*)    Glucose, Bld 163 (*)    Creatinine, Ser 1.87 (*)    GFR, Estimated 35 (*)    Anion gap 17 (*)    All other components within normal limits  URINALYSIS, ROUTINE W REFLEX MICROSCOPIC - Abnormal; Notable for the following components:   Color, Urine AMBER (*)    APPearance CLOUDY (*)    Protein, ur 100 (*)    Bacteria, UA RARE (*)    All other components within normal limits  I-STAT CHEM 8, ED - Abnormal; Notable for the following components:   Sodium 130 (*)    Potassium 2.4 (*)    Chloride 88 (*)    Creatinine, Ser 1.90 (*)    Glucose, Bld 168 (*)    Calcium, Ion 0.93 (*)    Hemoglobin 17.3 (*)    HCT 51.0 (*)    All other components within normal limits  CBG MONITORING, ED - Abnormal; Notable for the following components:   Glucose-Capillary 158  (*)    All other components within normal limits  PROTIME-INR  APTT  ETHANOL  LIPASE, BLOOD  MAGNESIUM    EKG EKG Interpretation Date/Time:  Monday January 14 2024 12:05:41 EST Ventricular Rate:  107 PR Interval:  132 QRS Duration:  88 QT Interval:  362 QTC Calculation: 483 R Axis:   83  Text Interpretation: Sinus tachycardia with occasional Premature ventricular complexes Nonspecific ST abnormality Abnormal ECG manually calculated qt of 534 Otherwise no significant change Confirmed by Melene Plan [phone]) on 01/14/2024 2:22:59 PM  Radiology US Abdomen Limited RUQ (LIVER/GB) Result Date: 01/14/2024 CLINICAL DATA:  Colicky right upper quadrant abdominal pain. EXAM: ULTRASOUND ABDOMEN LIMITED RIGHT UPPER QUADRANT COMPARISON:  CT abdomen/pelvis dated 01/06/2024. FINDINGS: Gallbladder: Mild gallbladder wall thickening. Echogenic layering material within the gallbladder may represent sludge. No definite shadowing stones. No sonographic Murphy sign noted by sonographer. Common bile duct: Diameter: 5 mm Liver: No focal lesion identified. Diffusely increased parenchymal echogenicity, most compatible with hepatic steatosis. Portal vein is patent on color Doppler imaging with normal direction of blood flow towards the liver. Other: None. IMPRESSION: 1. Mild gallbladder wall thickening with echogenic layering material within the gallbladder favored to represent sludge. No pericholecystic fluid or sonographic Murphy sign. This examination is indeterminate. Further evaluation with nuclear medicine HIDA scan could be considered. 2. Diffusely increased hepatic parenchymal echogenicity, most compatible with hepatic steatosis. Electronically Signed   By: Hart Robinsons M.D.   On: 01/14/2024 15:07   CT HEAD WO CONTRAST Result Date: 01/14/2024 CLINICAL DATA:  Neuro deficit, left-sided numbness. EXAM: CT HEAD WITHOUT CONTRAST TECHNIQUE: Contiguous axial images were obtained from the base of the skull through  the vertex without intravenous contrast. RADIATION DOSE REDUCTION: This exam was performed according to the departmental dose-optimization program which includes automated exposure control, adjustment of the mA and/or kV according to patient size and/or use of iterative reconstruction technique. COMPARISON:  CT head 05/17/2018 FINDINGS: Brain: No acute intracranial hemorrhage. No CT evidence of acute infarct. No edema, mass effect, or midline shift. The basilar cisterns are patent. Ventricles: The ventricles are normal. Vascular: No hyperdense vessel or unexpected calcification. Skull: No acute or aggressive finding. Orbits: Orbits are symmetric. Sinuses: The visualized paranasal sinuses are clear. Other: Mastoid air cells are clear. Rightward nasal septal deviation. IMPRESSION: No CT evidence  of acute intracranial abnormality. Electronically Signed   By: Emily Filbert M.D.   On: 01/14/2024 14:16    Procedures .Critical Care  Performed by: Melene Plan, DO Authorized by: Melene Plan, DO   Critical care provider statement:    Critical care time (minutes):  35   Critical care time was exclusive of:  Separately billable procedures and treating other patients   Critical care was time spent personally by me on the following activities:  Development of treatment plan with patient or surrogate, discussions with consultants, evaluation of patient's response to treatment, examination of patient, ordering and review of laboratory studies, ordering and review of radiographic studies, ordering and performing treatments and interventions, pulse oximetry, re-evaluation of patient's condition and review of old charts   Care discussed with: admitting provider       Medications Ordered in ED Medications  potassium chloride 10 mEq in 100 mL IVPB (10 mEq Intravenous New Bag/Given 01/14/24 1517)  sodium chloride flush (NS) 0.9 % injection 3 mL (3 mLs Intravenous Given 01/14/24 1516)  ondansetron (ZOFRAN) injection 4 mg  (4 mg Intramuscular Given 01/14/24 1244)  potassium chloride SA (KLOR-CON M) CR tablet 60 mEq (60 mEq Oral Given 01/14/24 1253)  diphenhydrAMINE (BENADRYL) injection 25 mg (25 mg Intravenous Given 01/14/24 1509)  HYDROmorphone (DILAUDID) injection 1 mg (1 mg Intravenous Given 01/14/24 1515)  prochlorperazine (COMPAZINE) injection 10 mg (10 mg Intravenous Given 01/14/24 1516)    ED Course/ Medical Decision Making/ A&P                                 Medical Decision Making Amount and/or Complexity of Data Reviewed Labs: ordered. Radiology: ordered.  Risk Prescription drug management.   38 yo F with a cc of intractable nausea and vomiting.  Going on for a couple days.  Patient concerned that she has gallbladder infection.  Someone told her that she had sludge in her gallbladder.  She has a potassium of 2.5.  She does have prolonged QT on my review of her EKG.  Will attempt antiemetics here.  Pain medicine and IV fluids.  Reassess.  The patients results and plan were reviewed and discussed.   Any x-rays performed were independently reviewed by myself.   Differential diagnosis were considered with the presenting HPI.  Medications  potassium chloride 10 mEq in 100 mL IVPB (10 mEq Intravenous New Bag/Given 01/14/24 1517)  sodium chloride flush (NS) 0.9 % injection 3 mL (3 mLs Intravenous Given 01/14/24 1516)  ondansetron (ZOFRAN) injection 4 mg (4 mg Intramuscular Given 01/14/24 1244)  potassium chloride SA (KLOR-CON M) CR tablet 60 mEq (60 mEq Oral Given 01/14/24 1253)  diphenhydrAMINE (BENADRYL) injection 25 mg (25 mg Intravenous Given 01/14/24 1509)  HYDROmorphone (DILAUDID) injection 1 mg (1 mg Intravenous Given 01/14/24 1515)  prochlorperazine (COMPAZINE) injection 10 mg (10 mg Intravenous Given 01/14/24 1516)    Vitals:   01/14/24 1140 01/14/24 1201  BP: (!) 142/110   Pulse: (!) 106   Resp: 16   Temp: (!) 97.5 F (36.4 C)   TempSrc: Oral   SpO2: 96%   Weight:  76.6 kg  Height:   5\' 2"  (1.575 m)    Final diagnoses:  Intractable nausea and vomiting  Hypokalemia    Admission/ observation were discussed with the admitting physician, patient and/or family and they are comfortable with the plan.          Final Clinical  Impression(s) / ED Diagnoses Final diagnoses:  Intractable nausea and vomiting  Hypokalemia    Rx / DC Orders ED Discharge Orders     None         Melene Plan, DO 01/14/24 1542

## 2024-01-14 NOTE — ED Triage Notes (Signed)
 Pt c/o left sided numbness of entire body at 0830. Pt states she has been up for days. Pt c/o urine incontinencex2d. Pt c/o LLQ, LUQ abominal pain2-3d. Pt states the abdominal pain got worse when she was having a BM yesterday. Pt c/o N/Vx2wks.

## 2024-01-14 NOTE — ED Notes (Signed)
EDP notified of hypokalemia 

## 2024-01-14 NOTE — H&P (Signed)
 History and Physical    PRICILLA MOEHLE Mcneil:[id number] DOB: Apr 07, 1986 DOA: 01/14/2024  PCP: Norm Salt, PA   Patient coming from: Home  I have personally briefly reviewed patient's old medical records in [facility]  Chief Complaint: Abdominal pain and vomiting  HPI: Jeanne Mcneil is a 38 y.o. female with medical history significant of cyclical vomiting syndrome, GERD, ongoing/chronic cannabis use, mood/anxiety disorder, current tobacco abuse with multiple recent ER visits for abdominal pain with nausea and vomiting including the most recent on 01/09/2024 when she was supposed to be admitted and GI was planning to perform EGD next day but she signed out AGAINST MEDICAL ADVICE.  She presented again today with worsening abdominal pain with persistent nausea and vomiting.  Patient states that her abdominal pain is constant, severe, mostly in the left lower quadrant but denies any urinary frequency, burning, hematuria, fever, diarrhea, chest pain, cough, shortness of breath, loss of consciousness or syncope.  Patient feels that her abdominal pain today is different than her normal.  Patient is concerned that she has a gallbladder infection.  ED Course: On presentation, her WBC was 14.4, sodium of 130, potassium of 2.4 and creatinine of 1.9.  Right upper quadrant ultrasound showed mild gallbladder wall thickening with echogenic layering material within the gallbladder favored to represent sludge with no pericholecystic fluid or sonographic Murphy sign.  CT of the head was negative for any acute intracranial abnormality.  Of note, she had CT of the abdomen on 01/02/2024 and 01/06/2024 which did not show any acute findings.  Her urine drug screen on 01/09/2024 was positive for tetrahydrocannabinol, opiates and benzodiazepines she was given IV analgesics and antiemetics but continued to have symptoms. Hospitalist service was called to evaluate the patient.  Review of Systems: As per HPI  otherwise all other systems were reviewed and are negative.   Past Medical History:  Diagnosis Date   Anxiety    Back pain    Constipation    Depression    Diverticulitis 10/31/2017   GERD (gastroesophageal reflux disease)    Glaucoma of both eyes    Hematuria    History of abnormal cervical Pap smear    History of chronic gastritis    History of kidney stones    Hydronephrosis, right    Renal calculi    bilateral per ct [id number]   Right ureteral stone    Trichimoniasis 09/05/2018   Ulcerative colitis (HCC)    Urgency of urination    Uterine fibroid    Uterine polyp    Vaginal Pap smear, abnormal     Past Surgical History:  Procedure Laterality Date   CYSTOSCOPY W/ URETERAL STENT PLACEMENT Right 07/04/2016   Procedure: CYSTOSCOPY WITH RIGHT RETROGRADE PYELOGRAM, RIGHT URETERAL STENT PLACEMENT;  Surgeon: Crist Fat, MD;  Location: [facility];  Service: Urology;  Laterality: Right;   CYSTOSCOPY WITH RETROGRADE PYELOGRAM, URETEROSCOPY AND STENT PLACEMENT Left 11/28/2007   CYSTOSCOPY WITH RETROGRADE PYELOGRAM, URETEROSCOPY AND STENT PLACEMENT Right 08/02/2016   Procedure: CYSTOSCOPY WITH RIGHT  RETROGRADE PYELOGRAM, URETEROSCOPY,  AND STENT EXCHANGE;  Surgeon: Crist Fat, MD;  Location: [facility];  Service: Urology;  Laterality: Right;   ESOPHAGOGASTRODUODENOSCOPY N/A 05/17/2016   Dr. Darrick Penna: LA Grade A esophagitis, mild gastritis s/p biopsy noting reactive gastritis, medium sized hiatal hernia, next EGD WITH PROPOFOL    INDUCED ABORTION  2009   VAGINAL HYSTERECTOMY Bilateral 08/10/2021   Procedure: HYSTERECTOMY VAGINAL;  Surgeon: Lazaro Arms, MD;  Location: [facility];  Service: Gynecology;  Laterality: Bilateral;     reports that she has been smoking cigarettes. She has a 20 pack-year smoking history. She has never used smokeless tobacco. She reports that she does not currently use drugs after having used the following drugs: Marijuana. She reports  that she does not drink alcohol.  Allergies  Allergen Reactions   Nsaids Other (See Comments)    Stomach bleeding   Tape Other (See Comments)    Reaction:  Skin blisters and burns. Use paper tape only!!   Keflex [Cephalexin] Other (See Comments)    Reaction:  Bladder spasms   Toradol [Ketorolac Tromethamine] Other (See Comments)    Told not to take it due to history of kidney failure   Codeine Hives and Itching   Vicodin [Hydrocodone-Acetaminophen] Hives and Itching    Family History  Problem Relation Age of Onset   Colon cancer Paternal Uncle        Passed away 02/12/2015   Nephrolithiasis Father    Hypertension Father    Nephrolithiasis Sister    Nephrolithiasis Sister    Nephrolithiasis Sister    Hypertension Mother    Leukemia Mother    Colon cancer Maternal Grandfather    Club foot Son     Prior to Admission medications   Medication Sig Start Date End Date Taking? Authorizing Provider  baclofen (LIORESAL) 20 MG tablet Take 20 mg by mouth 3 (three) times daily. 01/10/24  Yes [provider]  ondansetron (ZOFRAN) 4 MG tablet Take 1 tablet (4 mg total) by mouth every 6 (six) hours. 01/02/24   Coral Spikes, DO    Physical Exam: Vitals:   01/14/24 1140 01/14/24 1201 01/14/24 1554  BP: (!) 142/110  119/68  Pulse: (!) 106  77  Resp: 16  18  Temp: (!) 97.5 F (36.4 C)    TempSrc: Oral    SpO2: 96%  97%  Weight:  76.6 kg   Height:  5\' 2"  (1.575 m)     Constitutional: NAD, calm, comfortable.  On room air. Vitals:   01/14/24 1140 01/14/24 1201 01/14/24 1554  BP: (!) 142/110  119/68  Pulse: (!) 106  77  Resp: 16  18  Temp: (!) 97.5 F (36.4 C)    TempSrc: Oral    SpO2: 96%  97%  Weight:  76.6 kg   Height:  5\' 2"  (1.575 m)    Eyes: PERRL, lids and conjunctivae normal ENMT: Mucous membranes are dry.  Posterior pharynx clear of any exudate or lesions. Neck: normal, supple, no masses, no thyromegaly Respiratory: bilateral decreased breath sounds at bases,  no wheezing, no crackles. Normal respiratory effort. No accessory muscle use.  Cardiovascular: S1 S2 positive, tachycardic.  No extremity edema. 2+ pedal pulses.  Abdomen: Mild epigastric tenderness present, no masses palpated. No hepatosplenomegaly. Bowel sounds positive.  Musculoskeletal: no clubbing / cyanosis. No joint deformity upper and lower extremities.  Skin: no rashes, lesions, ulcers. No induration Neurologic: Drowsy, wakes up and answers questions appropriately. CN 2-12 grossly intact. Moving extremities. No focal neurologic deficits.  Psychiatric: Flat affect.  Not agitated.  Looks anxious intermittently.   Labs on Admission: I have personally reviewed following labs and imaging studies  CBC: Recent Labs  Lab 01/09/24 0923 01/14/24 1207 01/14/24 1214  WBC 13.5* 14.4*  --   NEUTROABS 8.6* 9.0*  --   HGB 16.4* 16.3* 17.3*  HCT 47.5* 48.0* 51.0*  MCV 91.7 91.1  --   PLT 367  340  --    Basic Metabolic Panel: Recent Labs  Lab 01/09/24 0923 01/14/24 1207 01/14/24 1214  NA 132* 133* 130*  K 2.6* 2.5* 2.4*  CL 87* 88* 88*  CO2 29 28  --   GLUCOSE 119* 163* 168*  BUN 25* 15 17  CREATININE 1.87* 1.87* 1.90*  CALCIUM 9.6 9.7  --   MG 2.1  --   --    GFR: Estimated Creatinine Clearance: 38.1 mL/min (A) (by C-G formula based on SCr of 1.9 mg/dL (H)). Liver Function Tests: Recent Labs  Lab 01/09/24 0923 01/14/24 1207  AST 20 25  ALT 29 24  ALKPHOS 63 64  BILITOT 1.0 1.1  PROT 8.2* 7.6  ALBUMIN 4.6 4.3   Recent Labs  Lab 01/09/24 0923  LIPASE 27   No results for input(s): "AMMONIA" in the last 168 hours. Coagulation Profile: Recent Labs  Lab 01/14/24 1207  INR 1.0   Cardiac Enzymes: No results for input(s): "CKTOTAL", "CKMB", "CKMBINDEX", "TROPONINI" in the last 168 hours. BNP (last 3 results) No results for input(s): "PROBNP" in the last 8760 hours. HbA1C: No results for input(s): "HGBA1C" in the last 72 hours. CBG: Recent Labs  Lab  01/14/24 1212  GLUCAP 158*   Lipid Profile: No results for input(s): "CHOL", "HDL", "LDLCALC", "TRIG", "CHOLHDL", "LDLDIRECT" in the last 72 hours. Thyroid Function Tests: No results for input(s): "TSH", "T4TOTAL", "FREET4", "T3FREE", "THYROIDAB" in the last 72 hours. Anemia Panel: No results for input(s): "VITAMINB12", "FOLATE", "FERRITIN", "TIBC", "IRON", "RETICCTPCT" in the last 72 hours. Urine analysis:    Component Value Date/Time   COLORURINE AMBER (A) 01/14/2024 1241   APPEARANCEUR CLOUDY (A) 01/14/2024 1241   LABSPEC 1.021 01/14/2024 1241   PHURINE 5.0 01/14/2024 1241   GLUCOSEU NEGATIVE 01/14/2024 1241   HGBUR NEGATIVE 01/14/2024 1241   BILIRUBINUR NEGATIVE 01/14/2024 1241   KETONESUR NEGATIVE 01/14/2024 1241   PROTEINUR 100 (A) 01/14/2024 1241   UROBILINOGEN 1.0 08/06/2017 1348   NITRITE NEGATIVE 01/14/2024 1241   LEUKOCYTESUR NEGATIVE 01/14/2024 1241    Radiological Exams on Admission: US Abdomen Limited RUQ (LIVER/GB) Result Date: 01/14/2024 CLINICAL DATA:  Colicky right upper quadrant abdominal pain. EXAM: ULTRASOUND ABDOMEN LIMITED RIGHT UPPER QUADRANT COMPARISON:  CT abdomen/pelvis dated 01/06/2024. FINDINGS: Gallbladder: Mild gallbladder wall thickening. Echogenic layering material within the gallbladder may represent sludge. No definite shadowing stones. No sonographic Murphy sign noted by sonographer. Common bile duct: Diameter: 5 mm Liver: No focal lesion identified. Diffusely increased parenchymal echogenicity, most compatible with hepatic steatosis. Portal vein is patent on color Doppler imaging with normal direction of blood flow towards the liver. Other: None. IMPRESSION: 1. Mild gallbladder wall thickening with echogenic layering material within the gallbladder favored to represent sludge. No pericholecystic fluid or sonographic Murphy sign. This examination is indeterminate. Further evaluation with nuclear medicine HIDA scan could be considered. 2. Diffusely  increased hepatic parenchymal echogenicity, most compatible with hepatic steatosis. Electronically Signed   By: Hart Robinsons M.D.   On: 01/14/2024 15:07   CT HEAD WO CONTRAST Result Date: 01/14/2024 CLINICAL DATA:  Neuro deficit, left-sided numbness. EXAM: CT HEAD WITHOUT CONTRAST TECHNIQUE: Contiguous axial images were obtained from the base of the skull through the vertex without intravenous contrast. RADIATION DOSE REDUCTION: This exam was performed according to the departmental dose-optimization program which includes automated exposure control, adjustment of the mA and/or kV according to patient size and/or use of iterative reconstruction technique. COMPARISON:  CT head 05/17/2018 FINDINGS: Brain: No acute intracranial hemorrhage. No  CT evidence of acute infarct. No edema, mass effect, or midline shift. The basilar cisterns are patent. Ventricles: The ventricles are normal. Vascular: No hyperdense vessel or unexpected calcification. Skull: No acute or aggressive finding. Orbits: Orbits are symmetric. Sinuses: The visualized paranasal sinuses are clear. Other: Mastoid air cells are clear. Rightward nasal septal deviation. IMPRESSION: No CT evidence of acute intracranial abnormality. Electronically Signed   By: Emily Filbert M.D.   On: 01/14/2024 14:16    Assessment/Plan  Intractable nausea and vomiting with abdominal pain Possible cannabinoid hyperemesis syndrome -Patient has had multiple ED visits due to the same.  Patient apparently uses marijuana daily for the last 12 years or so. -She had recent CT of the abdomen on 10/31/2024 and 01/06/2024 which were essentially unremarkable.  Urine drug screen positive for tetrahydrocannabinol on 01/09/2024.  She was seen by GI on 01/09/2024 at [facility] who are planning for EGD the next day. -Right upper quadrant ultrasound today shows possible gallbladder sludging but no evidence of Murphy sign or cholecystitis. -Continue IV fluids, antiemetics,  IV PPI.  Counseled regarding tetrahydrocannabinol cessation.  TOC consult. -Patient is not convinced that her symptoms are from cannabis use.  Will consult GI.  Acute kidney injury -Possibly from dehydration.  Continue IV fluids.  Repeat a.m. labs  Hyponatremia -Continue IV fluids.  Repeat a.m. labs  Hypokalemia -Replace.  Repeat a.m. labs  Leukocytosis -Mostly reactive.  Monitor.  DVT prophylaxis: SCDs Code Status: Full Family Communication: None at bedside Disposition Plan: Home in 1 to 2 days mostly improved Consults called: GI Admission status: Observation/telemetry  Severity of Illness: The appropriate patient status for this patient is OBSERVATION. Observation status is judged to be reasonable and necessary in order to provide the required intensity of service to ensure the patient's safety. The patient's presenting symptoms, physical exam findings, and initial radiographic and laboratory data in the context of their medical condition is felt to place them at decreased risk for further clinical deterioration. Furthermore, it is anticipated that the patient will be medically stable for discharge from the hospital within 2 midnights of admission.     Glade Lloyd MD [facility]  01/14/2024, 4:07 PM

## 2024-01-14 NOTE — ED Notes (Signed)
 CCMD called and verified patient on cardiac tele

## 2024-01-14 NOTE — ED Provider Triage Note (Signed)
 Emergency Medicine Provider Triage Evaluation Note  Jeanne Mcneil , a 38 y.o. female  was evaluated in triage.  Pt complains of multiple complaints.  Patient here stating that 2 weeks ago she experienced GI bug for 48 hours.  States she had nausea and vomiting and diarrhea.  Reports that ever since GI bug 2 weeks ago she had not had a bowel movement until yesterday.  Reports yesterday she had a bowel movement and then "I have just been going downhill since then".  States that this morning around 8:30 AM she began to develop left leg weakness and numbness.  Also reports constant nausea for the last 2 weeks.  Denies vomiting or diarrhea.  Denies fevers at home, dysuria.  Also reporting urinary incontinence for the last 2 weeks.  Reports that she will stand up and spontaneously urinate on herself but denies any groin numbness, pain in her low back.  Also complaining of pain in her left lower quadrant, right upper quadrant.  Review of Systems  Positive:  Negative:   Physical Exam  BP (!) 142/110 (BP Location: Left Arm)   Pulse (!) 106   Temp (!) 97.5 F (36.4 C) (Oral)   Resp 16   Ht 5\' 2"  (1.575 m)   Wt 76.6 kg   LMP 06/30/2021   SpO2 96%   BMI 30.89 kg/m  Gen:   Awake, no distress   Resp:  Normal effort  MSK:   Moves extremities without difficulty  Other:  No focal neurodeficits on exam  Medical Decision Making  Medically screening exam initiated at 12:47 PM.  Appropriate orders placed.  Lattie Haw was informed that the remainder of the evaluation will be completed by another provider, this initial triage assessment does not replace that evaluation, and the importance of remaining in the ED until their evaluation is complete.     Al Decant, PA-C 01/14/24 1249

## 2024-01-15 DIAGNOSIS — R1011 Right upper quadrant pain: Secondary | ICD-10-CM | POA: Diagnosis not present

## 2024-01-15 DIAGNOSIS — Z885 Allergy status to narcotic agent status: Secondary | ICD-10-CM | POA: Diagnosis not present

## 2024-01-15 DIAGNOSIS — Z886 Allergy status to analgesic agent status: Secondary | ICD-10-CM | POA: Diagnosis not present

## 2024-01-15 DIAGNOSIS — R112 Nausea with vomiting, unspecified: Secondary | ICD-10-CM | POA: Diagnosis not present

## 2024-01-15 DIAGNOSIS — Z91048 Other nonmedicinal substance allergy status: Secondary | ICD-10-CM | POA: Diagnosis not present

## 2024-01-15 DIAGNOSIS — H409 Unspecified glaucoma: Secondary | ICD-10-CM | POA: Diagnosis not present

## 2024-01-15 DIAGNOSIS — E860 Dehydration: Secondary | ICD-10-CM | POA: Diagnosis not present

## 2024-01-15 DIAGNOSIS — D72829 Elevated white blood cell count, unspecified: Secondary | ICD-10-CM | POA: Diagnosis not present

## 2024-01-15 DIAGNOSIS — R932 Abnormal findings on diagnostic imaging of liver and biliary tract: Secondary | ICD-10-CM | POA: Diagnosis not present

## 2024-01-15 DIAGNOSIS — K2210 Ulcer of esophagus without bleeding: Secondary | ICD-10-CM | POA: Diagnosis not present

## 2024-01-15 DIAGNOSIS — Z8249 Family history of ischemic heart disease and other diseases of the circulatory system: Secondary | ICD-10-CM | POA: Diagnosis not present

## 2024-01-15 DIAGNOSIS — K259 Gastric ulcer, unspecified as acute or chronic, without hemorrhage or perforation: Secondary | ICD-10-CM | POA: Diagnosis not present

## 2024-01-15 DIAGNOSIS — K760 Fatty (change of) liver, not elsewhere classified: Secondary | ICD-10-CM | POA: Diagnosis not present

## 2024-01-15 DIAGNOSIS — N179 Acute kidney failure, unspecified: Secondary | ICD-10-CM | POA: Diagnosis not present

## 2024-01-15 DIAGNOSIS — F17210 Nicotine dependence, cigarettes, uncomplicated: Secondary | ICD-10-CM | POA: Diagnosis not present

## 2024-01-15 DIAGNOSIS — F1290 Cannabis use, unspecified, uncomplicated: Secondary | ICD-10-CM | POA: Diagnosis not present

## 2024-01-15 DIAGNOSIS — K820 Obstruction of gallbladder: Secondary | ICD-10-CM | POA: Diagnosis not present

## 2024-01-15 DIAGNOSIS — E876 Hypokalemia: Secondary | ICD-10-CM | POA: Diagnosis not present

## 2024-01-15 DIAGNOSIS — E861 Hypovolemia: Secondary | ICD-10-CM | POA: Diagnosis not present

## 2024-01-15 DIAGNOSIS — E66811 Obesity, class 1: Secondary | ICD-10-CM | POA: Diagnosis not present

## 2024-01-15 DIAGNOSIS — R1013 Epigastric pain: Secondary | ICD-10-CM | POA: Diagnosis not present

## 2024-01-15 DIAGNOSIS — R948 Abnormal results of function studies of other organs and systems: Secondary | ICD-10-CM | POA: Diagnosis not present

## 2024-01-15 DIAGNOSIS — F419 Anxiety disorder, unspecified: Secondary | ICD-10-CM | POA: Diagnosis not present

## 2024-01-15 DIAGNOSIS — F32A Depression, unspecified: Secondary | ICD-10-CM | POA: Diagnosis not present

## 2024-01-15 DIAGNOSIS — K811 Chronic cholecystitis: Secondary | ICD-10-CM | POA: Diagnosis not present

## 2024-01-15 DIAGNOSIS — Z888 Allergy status to other drugs, medicaments and biological substances status: Secondary | ICD-10-CM | POA: Diagnosis not present

## 2024-01-15 DIAGNOSIS — R7989 Other specified abnormal findings of blood chemistry: Secondary | ICD-10-CM | POA: Diagnosis not present

## 2024-01-15 DIAGNOSIS — K219 Gastro-esophageal reflux disease without esophagitis: Secondary | ICD-10-CM | POA: Diagnosis not present

## 2024-01-15 DIAGNOSIS — E871 Hypo-osmolality and hyponatremia: Secondary | ICD-10-CM | POA: Diagnosis not present

## 2024-01-15 DIAGNOSIS — Z79899 Other long term (current) drug therapy: Secondary | ICD-10-CM | POA: Diagnosis not present

## 2024-01-15 DIAGNOSIS — Z560 Unemployment, unspecified: Secondary | ICD-10-CM | POA: Diagnosis not present

## 2024-01-15 LAB — COMPREHENSIVE METABOLIC PANEL
ALT: 22 U/L (ref 0–44)
AST: 24 U/L (ref 15–41)
Albumin: 3.5 g/dL (ref 3.5–5.0)
Alkaline Phosphatase: 51 U/L (ref 38–126)
Anion gap: 12 (ref 5–15)
BUN: 15 mg/dL (ref 6–20)
CO2: 26 mmol/L (ref 22–32)
Calcium: 8.8 mg/dL — ABNORMAL LOW (ref 8.9–10.3)
Chloride: 96 mmol/L — ABNORMAL LOW (ref 98–111)
Creatinine, Ser: 1.38 mg/dL — ABNORMAL HIGH (ref 0.44–1.00)
GFR, Estimated: 50 mL/min — ABNORMAL LOW (ref >60)
Glucose, Bld: 125 mg/dL — ABNORMAL HIGH (ref 70–99)
Potassium: 2.9 mmol/L — ABNORMAL LOW (ref 3.5–5.1)
Sodium: 134 mmol/L — ABNORMAL LOW (ref 135–145)
Total Bilirubin: 0.9 mg/dL (ref 0.0–1.2)
Total Protein: 6.1 g/dL — ABNORMAL LOW (ref 6.5–8.1)

## 2024-01-15 LAB — CBC
HCT: 41.9 % (ref 36.0–46.0)
Hemoglobin: 14.2 g/dL (ref 12.0–15.0)
MCH: 31 pg (ref 26.0–34.0)
MCHC: 33.9 g/dL (ref 30.0–36.0)
MCV: 91.5 fL (ref 80.0–100.0)
Platelets: 288 10*3/uL (ref 150–400)
RBC: 4.58 MIL/uL (ref 3.87–5.11)
RDW: 12.1 % (ref 11.5–15.5)
WBC: 10.5 10*3/uL (ref 4.0–10.5)
nRBC: 0 % (ref 0.0–0.2)

## 2024-01-15 LAB — MAGNESIUM: Magnesium: 1.9 mg/dL (ref 1.7–2.4)

## 2024-01-15 MED ORDER — POTASSIUM CHLORIDE 10 MEQ/100ML IV SOLN
10.0000 meq | INTRAVENOUS | Status: AC
  Administered 2024-01-15 (×4): 10 meq via INTRAVENOUS
  Filled 2024-01-15 (×4): qty 100

## 2024-01-15 MED ORDER — POTASSIUM CHLORIDE CRYS ER 20 MEQ PO TBCR
40.0000 meq | EXTENDED_RELEASE_TABLET | ORAL | Status: DC
  Filled 2024-01-15: qty 2

## 2024-01-15 MED ORDER — STERILE WATER FOR INJECTION IJ SOLN
INTRAMUSCULAR | Status: AC
Start: 2024-01-15 — End: 2024-01-15
  Administered 2024-01-15: 10 mL
  Filled 2024-01-15: qty 10

## 2024-01-15 MED ORDER — NICOTINE 21 MG/24HR TD PT24
21.0000 mg | MEDICATED_PATCH | Freq: Every day | TRANSDERMAL | Status: DC
  Administered 2024-01-15 – 2024-01-17 (×3): 21 mg via TRANSDERMAL
  Filled 2024-01-15 (×3): qty 1

## 2024-01-15 NOTE — Plan of Care (Signed)
  Problem: Education: Goal: Knowledge of General Education information will improve Description: Including pain rating scale, medication(s)/side effects and non-pharmacologic comfort measures Outcome: Progressing   Problem: Health Behavior/Discharge Planning: Goal: Ability to manage health-related needs will improve Outcome: Progressing   Problem: Clinical Measurements: Goal: Ability to maintain clinical measurements within normal limits will improve Outcome: Progressing Goal: Will remain free from infection Outcome: Progressing Goal: Diagnostic test results will improve Outcome: Progressing Goal: Respiratory complications will improve Outcome: Progressing Goal: Cardiovascular complication will be avoided Outcome: Progressing   Problem: Nutrition: Goal: Adequate nutrition will be maintained Outcome: Progressing   Problem: Coping: Goal: Level of anxiety will decrease Outcome: Progressing   Problem: Pain Managment: Goal: General experience of comfort will improve and/or be controlled Outcome: Progressing   Problem: Safety: Goal: Ability to remain free from injury will improve Outcome: Progressing   Problem: Skin Integrity: Goal: Risk for impaired skin integrity will decrease Outcome: Progressing
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
## 2024-01-15 NOTE — Progress Notes (Signed)
 Informed by  GI PA  Amy Esterwood patient to have HIDA Scan after endoscopy tomorrow. Instructed to hold pain medication after midnight By Nuclear medicine team. Patient informed and voiced understanding .
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
## 2024-01-15 NOTE — Progress Notes (Signed)
 PROGRESS NOTE    Jeanne Mcneil  NGE:[id number] DOB: 1986/10/20 DOA: 01/14/2024 PCP: Norm Salt, PA   Brief Narrative:  38 y.o. female with medical history significant of cyclical vomiting syndrome, GERD, ongoing/chronic cannabis use, mood/anxiety disorder, current tobacco abuse with multiple recent ER visits for abdominal pain with nausea and vomiting including the most recent on 01/09/2024 when she was supposed to be admitted and GI was planning to perform EGD next day but she signed out AGAINST MEDICAL ADVICE.  She presented again with worsening abdominal pain with persistent nausea and vomiting. On presentation, her WBC was 14.4, sodium of 130, potassium of 2.4 and creatinine of 1.9. Right upper quadrant ultrasound showed mild gallbladder wall thickening with echogenic layering material within the gallbladder favored to represent sludge with no pericholecystic fluid or sonographic Murphy sign. CT of the head was negative for any acute intracranial abnormality. She was given IV analgesics and antiemetics but continued to have symptoms.   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  Patient apparently uses marijuana daily for the last 12 years or so. -She had recent CT of the abdomen on 10/31/2024 and 01/06/2024 which were essentially unremarkable.  Urine drug screen positive for tetrahydrocannabinol on 01/09/2024.  She was seen by GI on 01/09/2024 at [facility] who were planning for EGD the next day but she signed out AGAINST MEDICAL ADVICE. -Right upper quadrant ultrasound this admission showed possible gallbladder sludging but no evidence of Murphy sign or cholecystitis. -Continue IV fluids, antiemetics, IV PPI.  Counseled regarding tetrahydrocannabinol cessation.  TOC consult. -Patient is not convinced that her symptoms are from cannabis use.   -GI has been  consulted.  Follow recommendations.   Acute kidney injury -Possibly from dehydration.  Continue IV fluids.  Labs pending today.  Repeat a.m. labs   Hyponatremia -Continue IV fluids.  Labs pending today.  Repeat a.m. labs   Hypokalemia -Labs pending today.  Repeat a.m. labs   Leukocytosis -Resolved  Obesity class I -Outpatient follow-up  DVT prophylaxis: SCDs Code Status: Full Family Communication: None at bedside Disposition Plan: Status is: Observation The patient will require care spanning > 2 midnights and should be moved to inpatient because: Severity of illness.  Continues to have symptoms.  Need for IV fluids and GI evaluation   Consultants: GI  Procedures: None  Antimicrobials: None   Subjective: Patient seen and examined at bedside.  Feels slightly better but still complains of intermittent nausea with abdominal pain. Had vomiting this morning. Does not feel ready to go home today.  No fever, chest pain or shortness of breath reported.  Objective: Vitals:   01/14/24 1554 01/14/24 1723 01/14/24 2001 01/15/24 0434  BP: 119/68 111/73 110/80 90/62  Pulse: 77 65 83 69  Resp: 18 18 18 18   Temp:  98 F (36.7 C) 98.3 F (36.8 C) 98.6 F (37 C)  TempSrc:  Oral Oral Oral  SpO2: 97% 95% 96% 98%  Weight:      Height:       No intake or output data in the 24 hours ending 01/15/24 0735 Filed Weights   01/14/24 1201  Weight: 76.6 kg    Examination:  General exam: Appears calm and comfortable.  On room air. Respiratory system: Bilateral decreased breath sounds at bases, no wheezing Cardiovascular system: S1 & S2 heard, Rate controlled Gastrointestinal system: Abdomen is obese, nondistended,  soft and mildly tender.  Normal bowel sounds heard. Extremities: No cyanosis, clubbing, edema  Central nervous system: Alert and oriented. No focal neurological deficits. Moving extremities Skin: No rashes, lesions or ulcers Psychiatry: Mostly flat affect.  Not agitated  currently.    Data Reviewed: I have personally reviewed following labs and imaging studies  CBC: Recent Labs  Lab 01/09/24 0923 01/14/24 1207 01/14/24 1214 01/15/24 0636  WBC 13.5* 14.4*  --  10.5  NEUTROABS 8.6* 9.0*  --   --   HGB 16.4* 16.3* 17.3* 14.2  HCT 47.5* 48.0* 51.0* 41.9  MCV 91.7 91.1  --  91.5  PLT 367 340  --  288   Basic Metabolic Panel: Recent Labs  Lab 01/09/24 0923 01/14/24 1207 01/14/24 1214  NA 132* 133* 130*  K 2.6* 2.5* 2.4*  CL 87* 88* 88*  CO2 29 28  --   GLUCOSE 119* 163* 168*  BUN 25* 15 17  CREATININE 1.87* 1.87* 1.90*  CALCIUM 9.6 9.7  --   MG 2.1 1.9  --    GFR: Estimated Creatinine Clearance: 38.1 mL/min (A) (by C-G formula based on SCr of 1.9 mg/dL (H)). Liver Function Tests: Recent Labs  Lab 01/09/24 0923 01/14/24 1207  AST 20 25  ALT 29 24  ALKPHOS 63 64  BILITOT 1.0 1.1  PROT 8.2* 7.6  ALBUMIN 4.6 4.3   Recent Labs  Lab 01/09/24 0923 01/14/24 1207  LIPASE 27 32   No results for input(s): "AMMONIA" in the last 168 hours. Coagulation Profile: Recent Labs  Lab 01/14/24 1207  INR 1.0   Cardiac Enzymes: No results for input(s): "CKTOTAL", "CKMB", "CKMBINDEX", "TROPONINI" in the last 168 hours. BNP (last 3 results) No results for input(s): "PROBNP" in the last 8760 hours. HbA1C: No results for input(s): "HGBA1C" in the last 72 hours. CBG: Recent Labs  Lab 01/14/24 1212  GLUCAP 158*   Lipid Profile: No results for input(s): "CHOL", "HDL", "LDLCALC", "TRIG", "CHOLHDL", "LDLDIRECT" in the last 72 hours. Thyroid Function Tests: No results for input(s): "TSH", "T4TOTAL", "FREET4", "T3FREE", "THYROIDAB" in the last 72 hours. Anemia Panel: No results for input(s): "VITAMINB12", "FOLATE", "FERRITIN", "TIBC", "IRON", "RETICCTPCT" in the last 72 hours. Sepsis Labs: No results for input(s): "PROCALCITON", "LATICACIDVEN" in the last 168 hours.  No results found for this or any previous visit (from the past 240  hours).       Radiology Studies: US Abdomen Limited RUQ (LIVER/GB) Result Date: 01/14/2024 CLINICAL DATA:  Colicky right upper quadrant abdominal pain. EXAM: ULTRASOUND ABDOMEN LIMITED RIGHT UPPER QUADRANT COMPARISON:  CT abdomen/pelvis dated 01/06/2024. FINDINGS: Gallbladder: Mild gallbladder wall thickening. Echogenic layering material within the gallbladder may represent sludge. No definite shadowing stones. No sonographic Murphy sign noted by sonographer. Common bile duct: Diameter: 5 mm Liver: No focal lesion identified. Diffusely increased parenchymal echogenicity, most compatible with hepatic steatosis. Portal vein is patent on color Doppler imaging with normal direction of blood flow towards the liver. Other: None. IMPRESSION: 1. Mild gallbladder wall thickening with echogenic layering material within the gallbladder favored to represent sludge. No pericholecystic fluid or sonographic Murphy sign. This examination is indeterminate. Further evaluation with nuclear medicine HIDA scan could be considered. 2. Diffusely increased hepatic parenchymal echogenicity, most compatible with hepatic steatosis. Electronically Signed   By: Hart Robinsons M.D.   On: 01/14/2024 15:07   CT HEAD WO CONTRAST Result Date: 01/14/2024 CLINICAL DATA:  Neuro deficit, left-sided numbness. EXAM: CT HEAD WITHOUT CONTRAST TECHNIQUE: Contiguous axial images were obtained  from the base of the skull through the vertex without intravenous contrast. RADIATION DOSE REDUCTION: This exam was performed according to the departmental dose-optimization program which includes automated exposure control, adjustment of the mA and/or kV according to patient size and/or use of iterative reconstruction technique. COMPARISON:  CT head 05/17/2018 FINDINGS: Brain: No acute intracranial hemorrhage. No CT evidence of acute infarct. No edema, mass effect, or midline shift. The basilar cisterns are patent. Ventricles: The ventricles are normal.  Vascular: No hyperdense vessel or unexpected calcification. Skull: No acute or aggressive finding. Orbits: Orbits are symmetric. Sinuses: The visualized paranasal sinuses are clear. Other: Mastoid air cells are clear. Rightward nasal septal deviation. IMPRESSION: No CT evidence of acute intracranial abnormality. Electronically Signed   By: Emily Filbert M.D.   On: 01/14/2024 14:16        Scheduled Meds:  ondansetron (ZOFRAN) IV  4 mg Intravenous Q6H   pantoprazole (PROTONIX) IV  40 mg Intravenous Q12H   Continuous Infusions:  0.9 % NaCl with KCl 40 mEq / L 100 mL/hr at 01/14/24 2200          Glade Lloyd, MD [facility] 01/15/2024, 7:35 AM

## 2024-01-15 NOTE — Plan of Care (Signed)
  Problem: Health Behavior/Discharge Planning: Goal: Ability to manage health-related needs will improve Outcome: Progressing   Problem: Activity: Goal: Risk for activity intolerance will decrease Outcome: Progressing   Problem: Nutrition: Goal: Adequate nutrition will be maintained Outcome: Not Progressing   

## 2024-01-16 ENCOUNTER — Encounter (HOSPITAL_COMMUNITY): Payer: Self-pay | Admitting: Internal Medicine

## 2024-01-16 ENCOUNTER — Inpatient Hospital Stay (HOSPITAL_COMMUNITY): Payer: 59 | Admitting: Anesthesiology

## 2024-01-16 ENCOUNTER — Encounter (HOSPITAL_COMMUNITY)
Admission: EM | Disposition: A | Discharge status: Home / Self Care | Payer: Self-pay | Source: Home / Self Care | Attending: Internal Medicine

## 2024-01-16 DIAGNOSIS — K259 Gastric ulcer, unspecified as acute or chronic, without hemorrhage or perforation: Secondary | ICD-10-CM | POA: Diagnosis not present

## 2024-01-16 DIAGNOSIS — E876 Hypokalemia: Secondary | ICD-10-CM

## 2024-01-16 DIAGNOSIS — D72829 Elevated white blood cell count, unspecified: Secondary | ICD-10-CM

## 2024-01-16 DIAGNOSIS — R1013 Epigastric pain: Secondary | ICD-10-CM

## 2024-01-16 DIAGNOSIS — N179 Acute kidney failure, unspecified: Secondary | ICD-10-CM

## 2024-01-16 DIAGNOSIS — E871 Hypo-osmolality and hyponatremia: Secondary | ICD-10-CM | POA: Diagnosis not present

## 2024-01-16 DIAGNOSIS — R112 Nausea with vomiting, unspecified: Secondary | ICD-10-CM | POA: Diagnosis not present

## 2024-01-16 HISTORY — PX: ESOPHAGOGASTRODUODENOSCOPY (EGD) WITH PROPOFOL: SHX5813

## 2024-01-16 LAB — BASIC METABOLIC PANEL
Anion gap: 8 (ref 5–15)
BUN: 14 mg/dL (ref 6–20)
CO2: 28 mmol/L (ref 22–32)
Calcium: 9 mg/dL (ref 8.9–10.3)
Chloride: 101 mmol/L (ref 98–111)
Creatinine, Ser: 1.06 mg/dL — ABNORMAL HIGH (ref 0.44–1.00)
GFR, Estimated: >60 mL/min (ref >60)
Glucose, Bld: 101 mg/dL — ABNORMAL HIGH (ref 70–99)
Potassium: 3.1 mmol/L — ABNORMAL LOW (ref 3.5–5.1)
Sodium: 137 mmol/L (ref 135–145)

## 2024-01-16 LAB — MAGNESIUM: Magnesium: 1.9 mg/dL (ref 1.7–2.4)

## 2024-01-16 SURGERY — ESOPHAGOGASTRODUODENOSCOPY (EGD) WITH PROPOFOL
Anesthesia: Monitor Anesthesia Care

## 2024-01-16 MED ORDER — SILVER SULFADIAZINE 1 % EX CREA: TOPICAL_CREAM | CUTANEOUS | Status: DC | PRN

## 2024-01-16 MED ORDER — METOCLOPRAMIDE HCL 5 MG/ML IJ SOLN
5.0000 mg | Freq: Four times a day (QID) | INTRAMUSCULAR | Status: AC
  Administered 2024-01-16 (×3): 5 mg via INTRAVENOUS
  Filled 2024-01-16 (×3): qty 2

## 2024-01-16 MED ORDER — LIDOCAINE 2% (20 MG/ML) 5 ML SYRINGE
INTRAMUSCULAR | Status: DC | PRN
Start: 2024-01-16 — End: 2024-01-16
  Administered 2024-01-16: 100 mg via INTRAVENOUS

## 2024-01-16 MED ORDER — PROPOFOL 10 MG/ML IV BOLUS
INTRAVENOUS | Status: DC | PRN
Start: 2024-01-16 — End: 2024-01-16
  Administered 2024-01-16: 40 mg via INTRAVENOUS
  Administered 2024-01-16 (×2): 20 mg via INTRAVENOUS
  Administered 2024-01-16: 60 mg via INTRAVENOUS

## 2024-01-16 MED ORDER — POTASSIUM CHLORIDE 10 MEQ/100ML IV SOLN
10.0000 meq | INTRAVENOUS | Status: AC
  Administered 2024-01-16 (×3): 10 meq via INTRAVENOUS
  Filled 2024-01-16 (×3): qty 100

## 2024-01-16 MED ORDER — POTASSIUM CHLORIDE 10 MEQ/100ML IV SOLN
10.0000 meq | INTRAVENOUS | Status: AC
  Administered 2024-01-16 (×2): 10 meq via INTRAVENOUS
  Filled 2024-01-16 (×12): qty 100

## 2024-01-16 MED ORDER — SODIUM CHLORIDE 0.9 % IV SOLN: INTRAVENOUS | Status: DC | PRN

## 2024-01-16 MED ORDER — GLYCOPYRROLATE PF 0.2 MG/ML IJ SOSY
PREFILLED_SYRINGE | INTRAMUSCULAR | Status: DC | PRN
  Administered 2024-01-16: .2 mg via INTRAVENOUS

## 2024-01-16 SURGICAL SUPPLY — 14 items

## 2024-01-16 NOTE — Progress Notes (Signed)
 PROGRESS NOTE    Jeanne Mcneil  ZOX:[id number] DOB: 10/02/1986 DOA: 01/14/2024 PCP: Norm Salt, PA   Chief Complaint  Patient presents with   Abdominal Pain   left side numbness    Brief Narrative:  38 y.o. female with medical history significant of cyclical vomiting syndrome, GERD, ongoing/chronic cannabis use, mood/anxiety disorder, current tobacco abuse with multiple recent ER visits for abdominal pain with nausea and vomiting including the most recent on 01/09/2024 when she was supposed to be admitted and GI was planning to perform EGD next day but she signed out AGAINST MEDICAL ADVICE.  She presented again with worsening abdominal pain with persistent nausea and vomiting. On presentation, her WBC was 14.4, sodium of 130, potassium of 2.4 and creatinine of 1.9. Right upper quadrant ultrasound showed mild gallbladder wall thickening with echogenic layering material within the gallbladder favored to represent sludge with no pericholecystic fluid or sonographic Murphy sign. CT of the head was negative for any acute intracranial abnormality. She was given IV analgesics and antiemetics but continued to have symptoms.    Assessment & Plan:   Principal Problem:   Intractable nausea and vomiting Active Problems:   Hypokalemia   AKI (acute kidney injury) (HCC)   Hyponatremia   Leukocytosis  #1 intractable nausea and vomiting with abdominal pain -Concern for possible cannabinoid hyperemesis syndrome. -Patient noted to have had multiple ED visits with similar symptoms. -Per prior physician patient apparently uses marijuana daily for the least the last 12 years. -Send CT abdomen and pelvis done on 10/31/2024 and 01/06/2024 unremarkable. -UDS positive for THC on 01/09/2024. -Patient noted to have been seen by GI on 01/09/2024 at [facility] whom were planning for endoscopy the next day however patient signed out AMA. -Upper quadrant ultrasound done with possible gallbladder  sludge and but no evidence of acute cholecystitis or Murphy sign. -HIDA scan pending. -Patient seen in consultation by GI underwent upper endoscopy this morning 01/16/2024 which showed single GE junction erosion and otherwise normal esophagus, normal stomach, normal examined duodenum. -Per GI suspect patient has history of cyclic vomiting syndrome likely postinfectious and expected to slowly improve and recommended soft diet for the next 1 to 2 days and advance as tolerated to a regular diet. -GI also recommending Reglan 5 mg 3 times daily with meals for 14 days and subsequently as needed Zofran thereafterwards.. -Continue IV fluids, supportive care, PPI.  2.  Acute kidney injury -Secondary to prerenal azotemia secondary to dehydration. -Renal function improved with hydration. -IV fluids, supportive care.  3.  Hypokalemia -Likely secondary to GI losses. -Magnesium at 1.9. -KCl 10 mEq IV every hour x 5 runs. -Repeat labs in AM.  4.  Hyponatremia -Likely secondary to hypovolemia. -Improved with hydration.  5.  Leukocytosis -Likely reactive leukocytosis. -Resolved.  6.  Obesity class I -Lifestyle modification. -Outpatient follow-up with PCP.   DVT prophylaxis: SCDs Code Status: Full Family Communication: Updated patient and boyfriend at bedside. Disposition: Likely home when clinically improved hopefully in the next 24 to 48 hours.  Status is: Inpatient Remains inpatient appropriate because: Severity of illness   Consultants:  Gastroenterology: Dr. Myrtie Neither III 01/15/2024  Procedures: CT head 01/14/2024 Right upper quadrant ultrasound 01/14/2024 Upper endoscopy: Per Dr.Danis III 01/16/2024 HIDA scan pending  Antimicrobials:  Anti-infectives (From admission, onward)    None         Subjective: Patient ambulating in room was in the bathroom.  Denies any chest pain or shortness of breath.  Denies any  further nausea or vomiting.  States abdominal pain/cramping somewhat  improved since admission.  Noted to have just completed EGD.  Wondering when the HIDA scan will be done.  Boyfriend in room.  Objective: Vitals:   01/16/24 0821 01/16/24 0924 01/16/24 0930 01/16/24 0940  BP: 121/80 95/70 (!) 116/90 127/82  Pulse: 61 79 68 69  Resp: 16 19 (!) 9 (!) 21  Temp: 97.8 F (36.6 C) 97.8 F (36.6 C)    TempSrc: Temporal Temporal    SpO2: 97% 99% 97% 100%  Weight: 69.9 kg     Height: 5\' 2"  (1.575 m)       Intake/Output Summary (Last 24 hours) at 01/16/2024 1641 Last data filed at 01/16/2024 4098 Gross per 24 hour  Intake 100 ml  Output 1 ml  Net 99 ml   Filed Weights   01/14/24 1201 01/16/24 0821  Weight: 76.6 kg 69.9 kg    Examination:  General exam: Appears calm and comfortable  Respiratory system: Clear to auscultation.  No wheezes, no crackles, no rhonchi.  Fair air movement.  Speaking in full sentences.  Respiratory effort normal. Cardiovascular system: S1 & S2 heard, RRR. No JVD, murmurs, rubs, gallops or clicks. No pedal edema. Gastrointestinal system: Abdomen is nondistended, soft and nontender. No organomegaly or masses felt. Normal bowel sounds heard. Central nervous system: Alert and oriented. No focal neurological deficits. Extremities: Symmetric 5 x 5 power. Skin: Erythematous region noted on neck and upper back secondary to hot water per patient. Psychiatry: Judgement and insight appear normal. Mood & affect appropriate.     Data Reviewed: I have personally reviewed following labs and imaging studies  CBC: Recent Labs  Lab 01/14/24 1207 01/14/24 1214 01/15/24 0636  WBC 14.4*  --  10.5  NEUTROABS 9.0*  --   --   HGB 16.3* 17.3* 14.2  HCT 48.0* 51.0* 41.9  MCV 91.1  --  91.5  PLT 340  --  288    Basic Metabolic Panel: Recent Labs  Lab 01/14/24 1207 01/14/24 1214 01/15/24 0636 01/16/24 0622  NA 133* 130* 134* 137  K 2.5* 2.4* 2.9* 3.1*  CL 88* 88* 96* 101  CO2 28  --  26 28  GLUCOSE 163* 168* 125* 101*  BUN 15 17  15 14   CREATININE 1.87* 1.90* 1.38* 1.06*  CALCIUM 9.7  --  8.8* 9.0  MG 1.9  --  1.9 1.9    GFR: Estimated Creatinine Clearance: 65.2 mL/min (A) (by C-G formula based on SCr of 1.06 mg/dL (H)).  Liver Function Tests: Recent Labs  Lab 01/14/24 1207 01/15/24 0636  AST 25 24  ALT 24 22  ALKPHOS 64 51  BILITOT 1.1 0.9  PROT 7.6 6.1*  ALBUMIN 4.3 3.5    CBG: Recent Labs  Lab 01/14/24 1212  GLUCAP 158*     No results found for this or any previous visit (from the past 240 hours).       Radiology Studies: No results found.       Scheduled Meds:  metoCLOPramide (REGLAN) injection  5 mg Intravenous Q6H   nicotine  21 mg Transdermal Daily   ondansetron (ZOFRAN) IV  4 mg Intravenous Q6H   pantoprazole (PROTONIX) IV  40 mg Intravenous Q12H   Continuous Infusions:     LOS: 1 day    Time spent: 40 minutes    Ramiro Harvest, MD [facility]   To contact the attending provider between 7A-7P or the covering provider during after hours 7P-7A,  please log into the web site [email or web address] and access using universal  password for that web site. If you do not have the password, please call the hospital operator.  01/16/2024, 4:41 PM

## 2024-01-16 NOTE — Anesthesia Preprocedure Evaluation (Addendum)
 Anesthesia Evaluation  Patient identified by MRN, date of birth, ID band Patient awake    Reviewed: Allergy & Precautions, NPO status , Patient's Chart, lab work & pertinent test results  Airway Mallampati: II  TM Distance: >3 FB Neck ROM: Full    Dental  (+) Teeth Intact, Dental Advisory Given   Pulmonary Current Smoker and Patient abstained from smoking.   Pulmonary exam normal breath sounds clear to auscultation       Cardiovascular Exercise Tolerance: Good negative cardio ROS Normal cardiovascular exam Rhythm:Regular Rate:Normal     Neuro/Psych Seizures -,  PSYCHIATRIC DISORDERS Anxiety Depression       GI/Hepatic Neg liver ROS, PUD,GERD  ,,Nausea vomiting, epigastric pain   Endo/Other  negative endocrine ROS  Obesity   Renal/GU Renal disease     Musculoskeletal negative musculoskeletal ROS (+)    Abdominal   Peds  Hematology negative hematology ROS (+)   Anesthesia Other Findings Day of surgery medications reviewed with the patient.  Reproductive/Obstetrics negative OB ROS                             Anesthesia Physical Anesthesia Plan  ASA: 2  Anesthesia Plan: MAC   Post-op Pain Management: Minimal or no pain anticipated   Induction: Intravenous  PONV Risk Score and Plan: 1 and TIVA and Treatment may vary due to age or medical condition  Airway Management Planned: Natural Airway and Simple Face Mask  Additional Equipment:   Intra-op Plan:   Post-operative Plan:   Informed Consent: I have reviewed the patients History and Physical, chart, labs and discussed the procedure including the risks, benefits and alternatives for the proposed anesthesia with the patient or authorized representative who has indicated his/her understanding and acceptance.     Dental advisory given  Plan Discussed with: CRNA and Anesthesiologist  Anesthesia Plan Comments:          Anesthesia Quick Evaluation

## 2024-01-16 NOTE — Transfer of Care (Signed)
 Immediate Anesthesia Transfer of Care Note  Patient: Jeanne Mcneil  Procedure(s) Performed: ESOPHAGOGASTRODUODENOSCOPY (EGD) WITH PROPOFOL  Patient Location: PACU  Anesthesia Type:MAC  Level of Consciousness: awake  Airway & Oxygen Therapy: Patient Spontanous Breathing  Post-op Assessment: Report given to RN  Post vital signs: stable  Last Vitals:  Vitals Value Taken Time  BP    Temp    Pulse 75 01/16/24 0927  Resp 14 01/16/24 0927  SpO2 99 % 01/16/24 0927  Vitals shown include unfiled device data.  Last Pain:  Vitals:   01/16/24 0821  TempSrc: Temporal  PainSc: 0-No pain      Patients Stated Pain Goal: 0 (01/15/24 1545)  Complications: No notable events documented.

## 2024-01-16 NOTE — Interval H&P Note (Signed)
 History and Physical Interval Note:  01/16/2024 9:09 AM  Jeanne Mcneil  has presented today for surgery, with the diagnosis of Nausea vomiting, epigastric pain.  The various methods of treatment have been discussed with the patient and family. After consideration of risks, benefits and other options for treatment, the patient has consented to  Procedure(s): ESOPHAGOGASTRODUODENOSCOPY (EGD) WITH PROPOFOL (N/A) as a surgical intervention.  The patient's history has been reviewed, patient examined, no change in status, stable for surgery.  I have reviewed the patient's chart and labs.  Questions were answered to the patient's satisfaction.     Charlie Pitter III

## 2024-01-16 NOTE — Op Note (Signed)
 [facility] Patient Name: Jeanne Mcneil Procedure Date : 01/16/2024 MRN: [id number] Attending MD: Starr Lake. Myrtie Neither , MD, [id number] Date of Birth: 1986-10-29 CSN: [id number] Age: 38 Admit Type: Inpatient Procedure:                Upper GI endoscopy Indications:              Nausea with vomiting                           Clinical details in 08/14/2024 inpatient consult note Providers:                Sherilyn Cooter L. Myrtie Neither, MD, Fransisca Connors, Marja Kays, Technician Referring MD:             [facility] Medicines:                Monitored Anesthesia Care Complications:            No immediate complications. Estimated Blood Loss:     Estimated blood loss: none. Procedure:                Pre-Anesthesia Assessment:                           - Prior to the procedure, a History and Physical                            was performed, and patient medications and                            allergies were reviewed. The patient's tolerance of                            previous anesthesia was also reviewed. The risks                            and benefits of the procedure and the sedation                            options and risks were discussed with the patient.                            All questions were answered, and informed consent                            was obtained. Prior Anticoagulants: The patient has                            taken no anticoagulant or antiplatelet agents. ASA                            Grade Assessment: II - A patient with mild systemic  disease. After reviewing the risks and benefits,                            the patient was deemed in satisfactory condition to                            undergo the procedure.                           After obtaining informed consent, the endoscope was                            passed under direct vision. Throughout the                             procedure, the patient's blood pressure, pulse, and                            oxygen saturations were monitored continuously. The                            [id number]-[id number] ([id number]) Olympus endoscope was introduced                            through the mouth, and advanced to the second part                            of duodenum. The upper GI endoscopy was                            accomplished without difficulty. The patient                            tolerated the procedure well. Scope In: Scope Out: Findings:      The esophagus was normal aside from a single EG junction erosion (likely       from vomiting).      The stomach was normal. Distends well with insufflation. No retained       food or liquid.      The cardia and gastric fundus were normal on retroflexion.      The examined duodenum was normal. Impression:               - Single EG junction erosion and otherwise normal                            esophagus.                           - Normal stomach.                           - Normal examined duodenum.                           - No specimens collected.  Suspect that this patient with a history of cyclic                            vomiting syndrome (which was more active years                            ago), now protracted postinfectious syndromes that                            are expected to slowly improve. Recommendation:           - Return patient to hospital ward for ongoing care.                           - Soft diet 1 to 2 days, advance as tolerated to                            regular diet.                           - When this patient is able to keep down sufficient                            food and liquids to maintain hydration, she patient                            can be discharged home to follow-up with her                            outpatient GI practice ([facility] in                            [place] or [facility],  both of which she                            has seen in the past)                           Recommend metoclopramide 5 mg by mouth 3 times                            daily before meals for the next 14 days. As needed                            ondansetron thereafter. Procedure Code(s):        --- Professional ---                           [id number], Esophagogastroduodenoscopy, flexible,                            transoral; diagnostic, including collection of  specimen(s) by brushing or washing, when performed                            (separate procedure) Diagnosis Code(s):        --- Professional ---                           R11.2, Nausea with vomiting, unspecified CPT copyright 2022 American Medical Association. All rights reserved. The codes documented in this report are preliminary and upon coder review may  be revised to meet current compliance requirements. [name] L. Myrtie Neither, MD 01/16/2024 9:29:36 AM This report has been signed electronically. Number of Addenda: 0

## 2024-01-16 NOTE — Anesthesia Postprocedure Evaluation (Signed)
 Anesthesia Post Note  Patient: Jeanne Mcneil  Procedure(s) Performed: ESOPHAGOGASTRODUODENOSCOPY (EGD) WITH PROPOFOL     Patient location during evaluation: Endoscopy Anesthesia Type: MAC Level of consciousness: awake and alert Pain management: pain level controlled Vital Signs Assessment: post-procedure vital signs reviewed and stable Respiratory status: spontaneous breathing, nonlabored ventilation, respiratory function stable and patient connected to nasal cannula oxygen Cardiovascular status: stable and blood pressure returned to baseline Postop Assessment: no apparent nausea or vomiting Anesthetic complications: no   No notable events documented.  Last Vitals:  Vitals:   01/16/24 0930 01/16/24 0940  BP: (!) 116/90 127/82  Pulse: 68 69  Resp: (!) 9 (!) 21  Temp:    SpO2: 97% 100%    Last Pain:                 Collene Schlichter

## 2024-01-16 NOTE — Interval H&P Note (Signed)
 History and Physical Interval Note:  01/16/2024 9:03 AM  Jeanne Mcneil  has presented today for surgery, with the diagnosis of Nausea vomiting, epigastric pain.  The various methods of treatment have been discussed with the patient and family. After consideration of risks, benefits and other options for treatment, the patient has consented to  Procedure(s): ESOPHAGOGASTRODUODENOSCOPY (EGD) WITH PROPOFOL (N/A) as a surgical intervention.  The patient's history has been reviewed, patient examined, no change in status, stable for surgery.  I have reviewed the patient's chart and labs.  Questions were answered to the patient's satisfaction.     Charlie Pitter III

## 2024-01-17 ENCOUNTER — Inpatient Hospital Stay (HOSPITAL_COMMUNITY): Payer: 59

## 2024-01-17 ENCOUNTER — Other Ambulatory Visit (HOSPITAL_COMMUNITY): Payer: Self-pay

## 2024-01-17 DIAGNOSIS — R948 Abnormal results of function studies of other organs and systems: Secondary | ICD-10-CM

## 2024-01-17 DIAGNOSIS — D72829 Elevated white blood cell count, unspecified: Secondary | ICD-10-CM | POA: Diagnosis not present

## 2024-01-17 DIAGNOSIS — E876 Hypokalemia: Secondary | ICD-10-CM | POA: Diagnosis not present

## 2024-01-17 DIAGNOSIS — N179 Acute kidney failure, unspecified: Secondary | ICD-10-CM | POA: Diagnosis not present

## 2024-01-17 DIAGNOSIS — R112 Nausea with vomiting, unspecified: Secondary | ICD-10-CM | POA: Diagnosis not present

## 2024-01-17 LAB — CBC
HCT: 37.2 % (ref 36.0–46.0)
Hemoglobin: 12.4 g/dL (ref 12.0–15.0)
MCH: 30.9 pg (ref 26.0–34.0)
MCHC: 33.3 g/dL (ref 30.0–36.0)
MCV: 92.8 fL (ref 80.0–100.0)
Platelets: 240 10*3/uL (ref 150–400)
RBC: 4.01 MIL/uL (ref 3.87–5.11)
RDW: 12.2 % (ref 11.5–15.5)
WBC: 8.5 10*3/uL (ref 4.0–10.5)
nRBC: 0 % (ref 0.0–0.2)

## 2024-01-17 LAB — BASIC METABOLIC PANEL
Anion gap: 8 (ref 5–15)
BUN: 16 mg/dL (ref 6–20)
CO2: 25 mmol/L (ref 22–32)
Calcium: 8.6 mg/dL — ABNORMAL LOW (ref 8.9–10.3)
Chloride: 102 mmol/L (ref 98–111)
Creatinine, Ser: 1.01 mg/dL — ABNORMAL HIGH (ref 0.44–1.00)
GFR, Estimated: >60 mL/min (ref >60)
Glucose, Bld: 109 mg/dL — ABNORMAL HIGH (ref 70–99)
Potassium: 3.6 mmol/L (ref 3.5–5.1)
Sodium: 135 mmol/L (ref 135–145)

## 2024-01-17 LAB — MAGNESIUM: Magnesium: 1.7 mg/dL (ref 1.7–2.4)

## 2024-01-17 MED ORDER — TECHNETIUM TC 99M MEBROFENIN IV KIT
5.4000 | PACK | Freq: Once | INTRAVENOUS | Status: AC | PRN
  Administered 2024-01-17: 5.4 via INTRAVENOUS

## 2024-01-17 MED ORDER — ONDANSETRON HCL 4 MG PO TABS
4.0000 mg | ORAL_TABLET | Freq: Three times a day (TID) | ORAL | 0 refills | Status: AC | PRN
  Filled 2024-01-17: qty 20, 7d supply, fill #0

## 2024-01-17 MED ORDER — METOCLOPRAMIDE HCL 5 MG PO TABS
5.0000 mg | ORAL_TABLET | Freq: Three times a day (TID) | ORAL | Status: DC
  Administered 2024-01-17: 5 mg via ORAL
  Filled 2024-01-17: qty 1

## 2024-01-17 MED ORDER — METOCLOPRAMIDE HCL 5 MG PO TABS
5.0000 mg | ORAL_TABLET | Freq: Three times a day (TID) | ORAL | 0 refills | Status: AC
  Filled 2024-01-17: qty 42, 14d supply, fill #0

## 2024-01-17 MED ORDER — MAGNESIUM SULFATE 2 GM/50ML IV SOLN
2.0000 g | Freq: Once | INTRAVENOUS | Status: AC
  Administered 2024-01-17: 2 g via INTRAVENOUS
  Filled 2024-01-17: qty 50

## 2024-01-17 MED ORDER — ACETAMINOPHEN 325 MG PO TABS: 650.0000 mg | ORAL_TABLET | Freq: Four times a day (QID) | ORAL | Status: DC | PRN

## 2024-01-17 MED ORDER — NICOTINE 21 MG/24HR TD PT24
21.0000 mg | MEDICATED_PATCH | Freq: Every day | TRANSDERMAL | 0 refills | Status: DC
  Filled 2024-01-17: qty 28, 28d supply, fill #0

## 2024-01-17 NOTE — Discharge Summary (Signed)
 Physician Discharge Summary  Jeanne Mcneil:[id number] DOB: 02-27-1986 DOA: 01/14/2024  PCP: Norm Salt, PA  Admit date: 01/14/2024 Discharge date: 01/17/2024  Time spent: 60 minutes  Recommendations for Outpatient Follow-up:  Follow-up with Norm Salt, PA in 2 weeks.  On follow-up patient will need a comprehensive metabolic profile done to follow-up on electrolytes, renal function.  Patient will need a magnesium level also checked. Follow-up with gastroenterology in [place] in 2 weeks. Follow-up with general surgery, office will call with appointment time.   Discharge Diagnoses:  Principal Problem:   Intractable nausea and vomiting Active Problems:   Hypokalemia   AKI (acute kidney injury) (HCC)   Hyponatremia   Leukocytosis   Abnormal biliary HIDA scan   Discharge Condition: Stable and improved.  Diet recommendation: Soft diet for 24 to 48 hours and advance to regular diet as tolerated.  Filed Weights   01/14/24 1201 01/16/24 0821  Weight: 76.6 kg 69.9 kg    History of present illness:  HPI per Dr. Micheline Mcneil is a 38 y.o. female with medical history significant of cyclical vomiting syndrome, GERD, ongoing/chronic cannabis use, mood/anxiety disorder, current tobacco abuse with multiple recent ER visits for abdominal pain with nausea and vomiting including the most recent on 01/09/2024 when she was supposed to be admitted and GI was planning to perform EGD next day but she signed out AGAINST MEDICAL ADVICE.  She presented again today with worsening abdominal pain with persistent nausea and vomiting.  Patient states that her abdominal pain is constant, severe, mostly in the left lower quadrant but denies any urinary frequency, burning, hematuria, fever, diarrhea, chest pain, cough, shortness of breath, loss of consciousness or syncope.  Patient feels that her abdominal pain today is different than her normal.  Patient is concerned that she has a  gallbladder infection.   ED Course: On presentation, her WBC was 14.4, sodium of 130, potassium of 2.4 and creatinine of 1.9.  Right upper quadrant ultrasound showed mild gallbladder wall thickening with echogenic layering material within the gallbladder favored to represent sludge with no pericholecystic fluid or sonographic Murphy sign.  CT of the head was negative for any acute intracranial abnormality.  Of note, she had CT of the abdomen on 01/02/2024 and 01/06/2024 which did not show any acute findings.  Her urine drug screen on 01/09/2024 was positive for tetrahydrocannabinol, opiates and benzodiazepines she was given IV analgesics and antiemetics but continued to have symptoms. Hospitalist service was called to evaluate the patient.   Hospital Course:  #1 intractable nausea and vomiting with abdominal pain -Concern for possible cannabinoid hyperemesis syndrome. -Patient noted to have had multiple ED visits with similar symptoms. -Per prior physician patient apparently uses marijuana daily for the least the last 12 years. -Send CT abdomen and pelvis done on 10/31/2024 and 01/06/2024 unremarkable. -UDS positive for THC on 01/09/2024. -Patient noted to have been seen by GI on 01/09/2024 at [facility] whom were planning for endoscopy the next day however patient signed out AMA. -Upper quadrant ultrasound done with possible gallbladder sludge and but no evidence of acute cholecystitis or Murphy sign. -Patient seen in consultation by GI underwent upper endoscopy this morning 01/16/2024 which showed single GE junction erosion and otherwise normal esophagus, normal stomach, normal examined duodenum. -Per GI suspect patient has history of cyclic vomiting syndrome likely postinfectious and expected to slowly improve and recommended soft diet for the next 1 to 2 days and advance as tolerated to a  regular diet. -GI also recommended Reglan 5 mg 3 times daily with meals for 14 days and subsequently as  needed Zofran thereafterwards.. -Patient maintained on IV PPI, IV fluids, was started on a soft diet and placed on scheduled IV Reglan with clinical improvement, no further emesis and tolerating a soft diet.   -HIDA scan was done which showed a patent cystic duct and common bile duct, gallbladder filled to contracted fatty meal stimulation findings suggestive of chronic cholecystitis.   -Patient was adamant on being discharged and as she had improved clinically patient be discharged home in stable condition.  -HIDA scan results discussed with general surgery and outpatient appointment will be made for patient for follow-up. -Patient will be discharged in stable and improved condition.  2.  Abnormal HIDA scan/??  Chronic cholecystitis -HIDA scan done did show a patent cystic duct and common bile duct, gallbladder filled to contract with fatty meal stimulation findings suggestive of chronic cholecystitis. -Patient improved clinically was tolerating a soft diet and adamant on being discharged. -HIDA scan results discussed with general surgery and outpatient follow-up appointment will be made for further evaluation and recommendations.   3.  Acute kidney injury -Secondary to prerenal azotemia secondary to dehydration. -Renal function improved with hydration and acute kidney injury had resolved by day of discharge. -Outpatient follow-up with PCP.   4.  Hypokalemia -Likely secondary to GI losses. -Magnesium at 1.9. -Potassium repleted.   -Outpatient follow-up with PCP.    5.  Hyponatremia -Likely secondary to hypovolemia. -Resolved with hydration   6.  Leukocytosis -Likely reactive leukocytosis. -Resolved.   7.  Obesity class I -Lifestyle modification. -Outpatient follow-up with PCP.      Procedures: CT head 01/14/2024 Right upper quadrant ultrasound 01/14/2024 Upper endoscopy: Per Dr.Danis III 01/16/2024 HIDA scan 01/17/2024  Consultations: Gastroenterology: Dr. Myrtie Neither III  01/15/2024 Curb sided general surgery  Discharge Exam: Vitals:   01/17/24 0823 01/17/24 1439  BP: 139/66 (!) 154/100  Pulse: 61 78  Resp: 16 18  Temp: 97.8 F (36.6 C) 97.7 F (36.5 C)  SpO2: 98% 100%    General: NAD Cardiovascular: RRR no murmurs rubs or gallops.  No JVD.  No lower extremity edema. Respiratory: Clear to auscultation bilaterally.  No wheezes, no crackles, no rhonchi.  Fair air movement.  Speaking in full sentences.  Discharge Instructions   Discharge Instructions     Diet general   Complete by: As directed    Soft diet for 24 hours and advance as tolerated to a regular diet.   Increase activity slowly   Complete by: As directed       Allergies as of 01/17/2024       Reactions   Nsaids Other (See Comments)   Stomach bleeding   Tape Other (See Comments)   Reaction:  Skin blisters and burns. Use paper tape only!!   Keflex [cephalexin] Other (See Comments)   Reaction:  Bladder spasms   Toradol [ketorolac Tromethamine] Other (See Comments)   Told not to take it due to history of kidney failure   Codeine Hives, Itching   Vicodin [hydrocodone-acetaminophen] Hives, Itching        Medication List     TAKE these medications    acetaminophen 325 MG tablet Commonly known as: TYLENOL Take 2 tablets (650 mg total) by mouth every 6 (six) hours as needed for mild pain (pain score 1-3) (or Fever >/= 101).   baclofen 20 MG tablet Commonly known as: LIORESAL Take 20 mg by mouth  3 (three) times daily.   metoCLOPramide 5 MG tablet Commonly known as: REGLAN Take 1 tablet (5 mg total) by mouth 3 (three) times daily before meals for 14 days.   nicotine 21 mg/24hr patch Commonly known as: NICODERM CQ - dosed in mg/24 hours Place 1 patch (21 mg total) onto the skin daily. Start taking on: January 18, 2024   ondansetron 4 MG tablet Commonly known as: ZOFRAN Take 1 tablet (4 mg total) by mouth every 8 (eight) hours as needed for nausea or vomiting. Start  taking on: February 01, 2024 What changed:  when to take this reasons to take this These instructions start on February 01, 2024. If you are unsure what to do until then, ask your doctor or other care provider.       Allergies  Allergen Reactions   Nsaids Other (See Comments)    Stomach bleeding   Tape Other (See Comments)    Reaction:  Skin blisters and burns. Use paper tape only!!   Keflex [Cephalexin] Other (See Comments)    Reaction:  Bladder spasms   Toradol [Ketorolac Tromethamine] Other (See Comments)    Told not to take it due to history of kidney failure   Codeine Hives and Itching   Vicodin [Hydrocodone-Acetaminophen] Hives and Itching    Follow-up Information     Norm Salt, PA. Schedule an appointment as soon as possible for a visit in 2 week(s).   Specialty: Physician Assistant Contact information: [address] [place] [address] [phone]         [facility]. Schedule an appointment as soon as possible for a visit in 2 week(s).   Contact information: [address] [place] [address] [phone]        [facility], [place]. Call.   Specialty: General Surgery Why: We are making an appointment for you to evaluate your gallbladder. Please call to confirm appointment time., Arrive 30 minutes early to complete check in, and bring photo ID and insurance card. Contact information: [address] [place] [address] [phone]                 The results of significant diagnostics from this hospitalization (including imaging, microbiology, ancillary and laboratory) are listed below for reference.    Significant Diagnostic Studies: NM Hepato W/EF Result Date: 01/17/2024 CLINICAL DATA:  RIGHT upper quadrant pain.  Biliary disease. EXAM: NUCLEAR MEDICINE HEPATOBILIARY IMAGING WITH GALLBLADDER EF TECHNIQUE: Sequential images of the abdomen were  obtained out to 60 minutes following intravenous administration of radiopharmaceutical. After oral ingestion of Ensure, gallbladder ejection fraction was determined. At 60 min, normal ejection fraction is greater than 33%. RADIOPHARMACEUTICALS:  5.4 mCi Tc-69m  Choletec IV COMPARISON:  Ultrasound 01/14/2024 FINDINGS: Prompt uptake and biliary excretion of activity by the liver is seen. Gallbladder activity is visualized, consistent with patency of cystic duct. Biliary activity passes into small bowel, consistent with patent common bile duct. Ensure fatty meal was administered to stimulate contraction of the gallbladder. No significant contraction following Ensure. Calculated gallbladder ejection fraction is 0%. (Normal gallbladder ejection fraction with Ensure is greater than 33% and less than 80%.) IMPRESSION: 1. Patent cystic duct and common bile duct. 2. Gallbladder failed to contract with fatty meal stimulation. Findings suggest chronic cholecystitis Electronically Signed   By: Genevive Bi M.D.   On: 01/17/2024 14:38   US Abdomen Limited RUQ (LIVER/GB) Result Date: 01/14/2024 CLINICAL DATA:  Colicky right upper quadrant  abdominal pain. EXAM: ULTRASOUND ABDOMEN LIMITED RIGHT UPPER QUADRANT COMPARISON:  CT abdomen/pelvis dated 01/06/2024. FINDINGS: Gallbladder: Mild gallbladder wall thickening. Echogenic layering material within the gallbladder may represent sludge. No definite shadowing stones. No sonographic Murphy sign noted by sonographer. Common bile duct: Diameter: 5 mm Liver: No focal lesion identified. Diffusely increased parenchymal echogenicity, most compatible with hepatic steatosis. Portal vein is patent on color Doppler imaging with normal direction of blood flow towards the liver. Other: None. IMPRESSION: 1. Mild gallbladder wall thickening with echogenic layering material within the gallbladder favored to represent sludge. No pericholecystic fluid or sonographic Murphy sign. This examination  is indeterminate. Further evaluation with nuclear medicine HIDA scan could be considered. 2. Diffusely increased hepatic parenchymal echogenicity, most compatible with hepatic steatosis. Electronically Signed   By: Hart Robinsons M.D.   On: 01/14/2024 15:07   CT HEAD WO CONTRAST Result Date: 01/14/2024 CLINICAL DATA:  Neuro deficit, left-sided numbness. EXAM: CT HEAD WITHOUT CONTRAST TECHNIQUE: Contiguous axial images were obtained from the base of the skull through the vertex without intravenous contrast. RADIATION DOSE REDUCTION: This exam was performed according to the departmental dose-optimization program which includes automated exposure control, adjustment of the mA and/or kV according to patient size and/or use of iterative reconstruction technique. COMPARISON:  CT head 05/17/2018 FINDINGS: Brain: No acute intracranial hemorrhage. No CT evidence of acute infarct. No edema, mass effect, or midline shift. The basilar cisterns are patent. Ventricles: The ventricles are normal. Vascular: No hyperdense vessel or unexpected calcification. Skull: No acute or aggressive finding. Orbits: Orbits are symmetric. Sinuses: The visualized paranasal sinuses are clear. Other: Mastoid air cells are clear. Rightward nasal septal deviation. IMPRESSION: No CT evidence of acute intracranial abnormality. Electronically Signed   By: Emily Filbert M.D.   On: 01/14/2024 14:16   CT ABDOMEN PELVIS W CONTRAST Result Date: 01/06/2024 CLINICAL DATA:  Right lower quadrant abdominal pain. EXAM: CT ABDOMEN AND PELVIS WITH CONTRAST TECHNIQUE: Multidetector CT imaging of the abdomen and pelvis was performed using the standard protocol following bolus administration of intravenous contrast. RADIATION DOSE REDUCTION: This exam was performed according to the departmental dose-optimization program which includes automated exposure control, adjustment of the mA and/or kV according to patient size and/or use of iterative reconstruction  technique. CONTRAST:  OMNIPAQUE IOHEXOL 300 MG/ML  SOLN COMPARISON:  CT scan abdomen and pelvis from 01/02/2024. FINDINGS: Lower chest: The lung bases are clear. No pleural effusion. The heart is normal in size. No pericardial effusion. Hepatobiliary: The liver is normal in size. Non-cirrhotic configuration. No suspicious mass. These is mild diffuse hepatic steatosis. There is superimposed focal dense fatty infiltration along the falciform ligament attachment site. No intrahepatic or extrahepatic bile duct dilation. No calcified gallstones. Normal gallbladder wall thickness. No pericholecystic inflammatory changes. Pancreas: Unremarkable. No pancreatic ductal dilatation or surrounding inflammatory changes. Spleen: Within normal limits. No focal lesion. Adrenals/Urinary Tract: Adrenal glands are unremarkable. No suspicious renal mass. There is a simple renal cyst in the medial portion of the left kidney upper pole measuring 1.7 x 2.0 cm. No hydronephrosis. No renal or ureteric calculi. Urinary bladder is under distended, precluding optimal assessment. However, no large mass or stones identified. No perivesical fat stranding. Stomach/Bowel: There is a small diverticulum arising from the second part of duodenum. No disproportionate dilation of the small or large bowel loops. No evidence of abnormal bowel wall thickening or inflammatory changes. The appendix is unremarkable. Vascular/Lymphatic: No ascites or pneumoperitoneum. No abdominal or pelvic lymphadenopathy, by size criteria. No  aneurysmal dilation of the major abdominal arteries. Reproductive: The uterus is surgically absent. No large adnexal mass. Note is made of a dominant follicle in the left ovary measuring 2.4 x 2.8 cm. Unremarkable right ovary. Other: There is a tiny fat containing umbilical hernia. The soft tissues and abdominal wall are otherwise unremarkable. Musculoskeletal: No suspicious osseous lesions. There are mild multilevel degenerative  changes in the visualized spine. IMPRESSION: 1. No acute inflammatory process identified within the abdomen or pelvis. Unremarkable appendix. 2. Multiple other nonacute observations, as described above. Electronically Signed   By: Jules Schick M.D.   On: 01/06/2024 15:54   CT ABDOMEN PELVIS W CONTRAST Result Date: 01/02/2024 CLINICAL DATA:  LLQ abdominal pain RLQ abdominal pain Diverticulitis, complication suspected EXAM: CT ABDOMEN AND PELVIS WITH CONTRAST TECHNIQUE: Multidetector CT imaging of the abdomen and pelvis was performed using the standard protocol following bolus administration of intravenous contrast. RADIATION DOSE REDUCTION: This exam was performed according to the departmental dose-optimization program which includes automated exposure control, adjustment of the mA and/or kV according to patient size and/or use of iterative reconstruction technique. CONTRAST:  OMNIPAQUE IOHEXOL 300 MG/ML  SOLN COMPARISON:  CT abdomen pelvis 04/04/2018 FINDINGS: Lower chest: No acute abnormality. Hepatobiliary: Vague hypodensity along possible limb again suggestive of focal fatty infiltration. No gallstones, gallbladder wall thickening, or pericholecystic fluid. No biliary dilatation. Pancreas: No focal lesion. Normal pancreatic contour. No surrounding inflammatory changes. No main pancreatic ductal dilatation. Spleen: Normal in size without focal abnormality. Adrenals/Urinary Tract: No adrenal nodule bilaterally. Bilateral kidneys enhance symmetrically. Left renal fluid dense lesion suggestive of a simple renal cyst. Simple renal cysts, in the absence of clinically indicated signs/symptoms, require no independent follow-up. Punctate right nephrolithiasis. No left nephrolithiasis. No ureterolithiasis bilaterally. No hydronephrosis. No hydroureter. The urinary bladder is unremarkable. Stomach/Bowel: Stomach is within normal limits. Second portion of the duodenum diverticula. Few scattered colonic  diverticula. No evidence of bowel wall thickening or dilatation. Fatty infiltration Of the walls of the ascending colon suggestive of chronic inflammation or constipation. Appendix appears normal. Vascular/Lymphatic: No abdominal aorta or iliac aneurysm. Mild atherosclerotic plaque of the aorta and its branches. No abdominal, pelvic, or inguinal lymphadenopathy. Reproductive: Status post hysterectomy. No adnexal masses. Other: No intraperitoneal free fluid. No intraperitoneal free gas. No organized fluid collection. Musculoskeletal: No abdominal wall hernia or abnormality. No suspicious lytic or blastic osseous lesions. No acute displaced fracture. L5-S1 intervertebral disc space vacuum phenomenon. Multilevel mild degenerative changes spine. Mild retrolisthesis of L2 on L3. IMPRESSION: 1. Nonobstructive punctate right nephrolithiasis. 2. Second portion of the duodenum diverticula and colonic diverticulosis with no acute diverticulitis. 3. Status post hysterectomy. Electronically Signed   By: Tish Frederickson M.D.   On: 01/02/2024 13:03    Microbiology: No results found for this or any previous visit (from the past 240 hours).   Labs: Basic Metabolic Panel: Recent Labs  Lab 01/14/24 1207 01/14/24 1214 01/15/24 0636 01/16/24 0622 01/17/24 0600  NA 133* 130* 134* 137 135  K 2.5* 2.4* 2.9* 3.1* 3.6  CL 88* 88* 96* 101 102  CO2 28  --  26 28 25   GLUCOSE 163* 168* 125* 101* 109*  BUN 15 17 15 14 16   CREATININE 1.87* 1.90* 1.38* 1.06* 1.01*  CALCIUM 9.7  --  8.8* 9.0 8.6*  MG 1.9  --  1.9 1.9 1.7   Liver Function Tests: Recent Labs  Lab 01/14/24 1207 01/15/24 0636  AST 25 24  ALT 24 22  ALKPHOS 64 51  BILITOT 1.1 0.9  PROT 7.6 6.1*  ALBUMIN 4.3 3.5   Recent Labs  Lab 01/14/24 1207  LIPASE 32   No results for input(s): "AMMONIA" in the last 168 hours. CBC: Recent Labs  Lab 01/14/24 1207 01/14/24 1214 01/15/24 0636 01/17/24 0600  WBC 14.4*  --  10.5 8.5  NEUTROABS 9.0*  --    --   --   HGB 16.3* 17.3* 14.2 12.4  HCT 48.0* 51.0* 41.9 37.2  MCV 91.1  --  91.5 92.8  PLT 340  --  288 240   Cardiac Enzymes: No results for input(s): "CKTOTAL", "CKMB", "CKMBINDEX", "TROPONINI" in the last 168 hours. BNP: BNP (last 3 results) No results for input(s): "BNP" in the last 8760 hours.  ProBNP (last 3 results) No results for input(s): "PROBNP" in the last 8760 hours.  CBG: Recent Labs  Lab 01/14/24 1212  GLUCAP 158*       Signed:  Ramiro Harvest MD.  [facility] 01/17/2024, 3:37 PM

## 2024-01-17 NOTE — Progress Notes (Addendum)
 Initial Nutrition Assessment  DOCUMENTATION CODES:   Not applicable  INTERVENTION:  Resume regular diet after procedure as tolerated  NUTRITION DIAGNOSIS:   Inadequate oral intake related to vomiting, nausea (poor appetitie prior to admission) as evidenced by per patient/family report.  GOAL:   Patient will meet greater than or equal to 90% of their needs  MONITOR:   PO intake, Diet advancement, Weight trends  REASON FOR ASSESSMENT:   Malnutrition Screening Tool   ASSESSMENT:   Pt admitted for intractable nausea and vomiting and epigastric pain. Pt with PMH of hypokalemia, UC, GERD, cannabinoid hyperemesis.   2/25: Ultrasound showed mild gallbladder wall thickening, sludge, and hepatic steatosis. 2/26: EGD, HIDA scan pending  Pt reports her doctor is planning on taking her gallbladder out soon but said she will likely go home and come back for the procedure because she does not want to be here anymore.   Pt has only been on soft diet and clear liquids for one day each during admission where she has documented intake of 0%. Pt has been NPO for most of her stay.   Pt reports despite being NPO multiple times throughout admission, she has been eating really good at the hospital. She reports eating cheeseburgers during admission. Pt reports she has not had an appetite for the past 18 days which is the time period she has been experiencing uncontrollable nausea and vomiting. Pt reports she ate nothing for the past 18 days except for sips of mountain dew.  Pt reports a 40 lb weight loss over the past 18 days. Per chart review pt weighed 160 lbs on 01/02/24 and is now weighing at 154 lbs. The only previous weights documented are from May of 2025.  She reports her bowel movements have been fine.  Meds: Protonix, magnesium sulfate IV, Reglan PRN, Zofran, maalox every 4 hours  Labs: glucose 101-125 in last 24 hrs, creatinine 1.01  NUTRITION - FOCUSED PHYSICAL EXAM:  Flowsheet Row  Most Recent Value  Orbital Region No depletion  Upper Arm Region No depletion  Thoracic and Lumbar Region No depletion  Buccal Region Mild depletion  Temple Region Mild depletion  Clavicle Bone Region No depletion  Clavicle and Acromion Bone Region No depletion  Scapular Bone Region No depletion  Dorsal Hand No depletion  Patellar Region No depletion  Anterior Thigh Region No depletion  [pt had leggings on. unable to visually assess but palpated and muscle was adequate]  Posterior Calf Region No depletion  Edema (RD Assessment) None  Hair Reviewed  Eyes Reviewed  Mouth Reviewed  Skin Reviewed  Nails Reviewed       Diet Order:   Diet Order             DIET DYS 3 Room service appropriate? Yes; Fluid consistency: Thin  Diet effective now                   EDUCATION NEEDS:   Not appropriate for education at this time  Skin:  Skin Assessment: Reviewed RN Assessment  Last BM:  no value  Height:   Ht Readings from Last 1 Encounters:  01/16/24 5\' 2"  (1.575 m)    Weight:   Wt Readings from Last 1 Encounters:  01/16/24 69.9 kg    BMI:  Body mass index is 28.17 kg/m.  Estimated Nutritional Needs:   Kcal:  1700-1900  Protein:  85-95 g  Fluid:  > 1.8 L    Maceo Pro, MS Dietetic Intern

## 2024-01-20 ENCOUNTER — Encounter (HOSPITAL_COMMUNITY): Payer: Self-pay | Admitting: Gastroenterology

## 2024-01-24 DIAGNOSIS — K828 Other specified diseases of gallbladder: Secondary | ICD-10-CM | POA: Diagnosis not present

## 2024-02-18 ENCOUNTER — Ambulatory Visit (INDEPENDENT_AMBULATORY_CARE_PROVIDER_SITE_OTHER): Payer: 59 | Admitting: Gastroenterology

## 2024-02-18 ENCOUNTER — Encounter (INDEPENDENT_AMBULATORY_CARE_PROVIDER_SITE_OTHER): Payer: Self-pay | Admitting: Gastroenterology

## 2024-02-18 VITALS — BP 128/76 | HR 85 | Temp 97.5°F | Ht 62.0 in | Wt 160.4 lb

## 2024-02-18 DIAGNOSIS — R197 Diarrhea, unspecified: Secondary | ICD-10-CM

## 2024-02-18 DIAGNOSIS — Z8719 Personal history of other diseases of the digestive system: Secondary | ICD-10-CM | POA: Diagnosis not present

## 2024-02-18 DIAGNOSIS — K219 Gastro-esophageal reflux disease without esophagitis: Secondary | ICD-10-CM | POA: Diagnosis not present

## 2024-02-18 DIAGNOSIS — R1030 Lower abdominal pain, unspecified: Secondary | ICD-10-CM

## 2024-02-18 DIAGNOSIS — R109 Unspecified abdominal pain: Secondary | ICD-10-CM | POA: Diagnosis not present

## 2024-02-18 DIAGNOSIS — K909 Intestinal malabsorption, unspecified: Secondary | ICD-10-CM | POA: Insufficient documentation

## 2024-02-18 MED ORDER — PANTOPRAZOLE SODIUM 40 MG PO TBEC: 40.0000 mg | DELAYED_RELEASE_TABLET | Freq: Every day | ORAL | 3 refills | Status: DC

## 2024-02-18 MED ORDER — DICYCLOMINE HCL 20 MG PO TABS: 10.0000 mg | ORAL_TABLET | Freq: Three times a day (TID) | ORAL | 3 refills | Status: AC

## 2024-02-18 MED ORDER — PEG 3350-KCL-NA BICARB-NACL 420 G PO SOLR: 4000.0000 mL | Freq: Once | ORAL | 0 refills | Status: AC

## 2024-02-18 MED ORDER — METAMUCIL SMOOTH TEXTURE 58.6 % PO POWD: 1.0000 | Freq: Two times a day (BID) | ORAL | 2 refills | Status: AC

## 2024-02-18 NOTE — Patient Instructions (Signed)
 It was very nice to meet you today, as dicussed with will plan for the following :  1) Colonoscopy  2) Lab work and stool sample  3) take bentyl and metamucil twice daily

## 2024-02-18 NOTE — Progress Notes (Signed)
 Vista Lawman , M.D. Gastroenterology & Hepatology [facility] Gastroenterology [address] [place], [place] [address] Primary Care Physician: Norm Salt, [place] [address] [place] [address]  Chief Complaint: Abdominal pain with diarrhea, history of colitis, dysfunctional gallbladder, GERD  History of Present Illness:  Jeanne Mcneil is a 38 y.o. female with anxiety, depression, diverticulitis, GERD, glaucoma, reported history of colitis (per chart UC, per patient, remote, intermittent episodes of colitis)  who presents for evaluation of Abdominal pain with diarrhea, history of colitis, dysfunctional gallbladder, GERD  Patient has recurrent episodes of abdominal pain with nausea and vomiting and underwent upper endoscopy 2 months back any cause to be found.  Recently had a HIDA scan which demonstrated occasional contraction of the gallbladder and patient was planned for cholecystectomy 4/22  Patient reports frequent episodes of lower abdominal pain as well as weight frequent bowel movements which are liquid 4-5 episodes daily patient has reported unintentional weight loss during the same time  Abdominal ultrasound shows mildly thickened gallbladder walls and gallbladder sludge, no stones, normal CBD  She also had a HIDA scan in 2018 with low EF at 21% but patent cystic duct Had numerous CTs over the past years, and also has history of nephrolithiasis.   Last EGD:12/2024   - Single EG junction erosion and otherwise normal esophagus. - Normal stomach. - Normal examined duodenum. - No specimens collected. Suspect that this patient with a history of cyclic vomiting syndrome ( which was more active years ago) , now protracted postinfectious syndromes that are expected to slowly improve.  Recommend metoclopramide 5 mg by mouth 3 times daily before meals for the next 14 days. As needed ondansetron thereafter.  Last Colonoscopy:none  FHx:  neg for any gastrointestinal/liver disease, no malignancies anxiety, depression, diverticulitis, GERD, glaucoma, reported history of colitis (per chart UC, per patient, remote, intermittent episodes of colitis)  Surgical: Hysterectomy  Labs from diabetes 12/2023 normal liver enzymes hemoglobin 12.4  Past Medical History: Past Medical History:  Diagnosis Date   Anxiety    Back pain    Constipation    Depression    Diverticulitis 10/31/2017   GERD (gastroesophageal reflux disease)    Glaucoma of both eyes    Hematuria    History of abnormal cervical Pap smear    History of chronic gastritis    History of kidney stones    Hydronephrosis, right    Renal calculi    bilateral per ct 0902-2017   Right ureteral stone    Trichimoniasis 09/05/2018   Ulcerative colitis (HCC)    Urgency of urination    Uterine fibroid    Uterine polyp    Vaginal Pap smear, abnormal     Past Surgical History: Past Surgical History:  Procedure Laterality Date   CYSTOSCOPY W/ URETERAL STENT PLACEMENT Right 07/04/2016   Procedure: CYSTOSCOPY WITH RIGHT RETROGRADE PYELOGRAM, RIGHT URETERAL STENT PLACEMENT;  Surgeon: Crist Fat, MD;  Location: [facility];  Service: Urology;  Laterality: Right;   CYSTOSCOPY WITH RETROGRADE PYELOGRAM, URETEROSCOPY AND STENT PLACEMENT Left 11/28/2007   CYSTOSCOPY WITH RETROGRADE PYELOGRAM, URETEROSCOPY AND STENT PLACEMENT Right 08/02/2016   Procedure: CYSTOSCOPY WITH RIGHT  RETROGRADE PYELOGRAM, URETEROSCOPY,  AND STENT EXCHANGE;  Surgeon: Crist Fat, MD;  Location: [facility];  Service: Urology;  Laterality: Right;   ESOPHAGOGASTRODUODENOSCOPY N/A 05/17/2016   Dr. Darrick Penna: LA Grade A esophagitis, mild gastritis s/p biopsy noting reactive gastritis, medium sized hiatal hernia, next EGD  WITH PROPOFOL    ESOPHAGOGASTRODUODENOSCOPY (EGD) WITH PROPOFOL N/A 01/16/2024   Procedure: ESOPHAGOGASTRODUODENOSCOPY (EGD) WITH PROPOFOL;  Surgeon: Sherrilyn Rist,  MD;  Location: [facility];  Service: Gastroenterology;  Laterality: N/A;   INDUCED ABORTION  03-17-2008   VAGINAL HYSTERECTOMY Bilateral 08/10/2021   Procedure: HYSTERECTOMY VAGINAL;  Surgeon: Lazaro Arms, MD;  Location: [facility];  Service: Gynecology;  Laterality: Bilateral;    Family History: Family History  Problem Relation Age of Onset   Colon cancer Paternal Uncle        Passed away Mar 18, 2015   Nephrolithiasis Father    Hypertension Father    Nephrolithiasis Sister    Nephrolithiasis Sister    Nephrolithiasis Sister    Hypertension Mother    Leukemia Mother    Colon cancer Maternal Grandfather    Club foot Son     Social History: Social History   Tobacco Use  Smoking Status Every Day   Current packs/day: 1.00   Average packs/day: 1 pack/day for 20.0 years (20.0 ttl pk-yrs)   Types: Cigarettes  Smokeless Tobacco Never   Social History   Substance and Sexual Activity  Alcohol Use No   Alcohol/week: 4.0 standard drinks of alcohol   Types: 4 Shots of liquor per week   Comment: states has not used in 2 years   Social History   Substance and Sexual Activity  Drug Use Not Currently   Types: Marijuana    Allergies: Allergies  Allergen Reactions   Nsaids Other (See Comments)    Stomach bleeding   Tape Other (See Comments)    Reaction:  Skin blisters and burns. Use paper tape only!!   Keflex [Cephalexin] Other (See Comments)    Reaction:  Bladder spasms   Toradol [Ketorolac Tromethamine] Other (See Comments)    Told not to take it due to history of kidney failure   Codeine Hives and Itching   Vicodin [Hydrocodone-Acetaminophen] Hives and Itching    Medications: Current Outpatient Medications  Medication Sig Dispense Refill   baclofen (LIORESAL) 20 MG tablet Take 20 mg by mouth 3 (three) times daily.     metoCLOPramide (REGLAN) 5 MG tablet Take 1 tablet (5 mg total) by mouth 3 (three) times daily before meals for 14 days. 42 tablet 0   ondansetron (ZOFRAN) 4 MG  tablet Take 1 tablet (4 mg total) by mouth every 8 (eight) hours as needed for nausea or vomiting. 20 tablet 0   No current facility-administered medications for this visit.    Review of Systems: GENERAL: negative for malaise, night sweats HEENT: No changes in hearing or vision, no nose bleeds or other nasal problems. NECK: Negative for lumps, goiter, pain and significant neck swelling RESPIRATORY: Negative for cough, wheezing CARDIOVASCULAR: Negative for chest pain, leg swelling, palpitations, orthopnea GI: SEE HPI MUSCULOSKELETAL: Negative for joint pain or swelling, back pain, and muscle pain. SKIN: Negative for lesions, rash HEMATOLOGY Negative for prolonged bleeding, bruising easily, and swollen nodes. ENDOCRINE: Negative for cold or heat intolerance, polyuria, polydipsia and goiter. NEURO: negative for tremor, gait imbalance, syncope and seizures. The remainder of the review of systems is noncontributory.   Physical Exam: BP 128/76 (BP Location: Right Arm, Patient Position: Sitting, Cuff Size: Large)   Pulse 85   Temp (!) 97.5 F (36.4 C) (Temporal)   Ht 5\' 2"  (1.575 m)   Wt 160 lb 6.4 oz (72.8 kg)   LMP 06/30/2021   BMI 29.34 kg/m  GENERAL: The patient is AO x3, in  no acute distress. HEENT: Head is normocephalic and atraumatic. EOMI are intact. Mouth is well hydrated and without lesions. NECK: Supple. No masses LUNGS: Clear to auscultation. No presence of rhonchi/wheezing/rales. Adequate chest expansion HEART: RRR, normal s1 and s2. ABDOMEN: Soft, nontender, no guarding, no peritoneal signs, and nondistended. BS +. No masses.   Imaging/Labs: as above     Latest Ref Rng & Units 01/17/2024    6:00 AM 01/15/2024    6:36 AM 01/14/2024   12:14 PM  CBC  WBC 4.0 - 10.5 K/uL 8.5  10.5    Hemoglobin 12.0 - 15.0 g/dL 54.0  98.1  19.1   Hematocrit 36.0 - 46.0 % 37.2  41.9  51.0   Platelets 150 - 400 K/uL 240  288     No results found for: "IRON", "TIBC", "FERRITIN"  I  personally reviewed and interpreted the available labs, imaging and endoscopic files.  HIDA scan  IMPRESSION: 1. Patent cystic duct and common bile duct. 2. Gallbladder failed to contract with fatty meal stimulation. Findings suggest chronic cholecystitis  Impression and Plan:  Jeanne Mcneil is a 38 y.o. female with anxiety, depression, diverticulitis, GERD, glaucoma, reported history of colitis (per chart UC, per patient, remote, intermittent episodes of colitis)  who presents for evaluation of Abdominal pain with diarrhea, history of colitis, dysfunctional gallbladder, GERD  #Diarrhea with abdominal pain #History of Colitis  Currently patient is on antibiotics for MRSA treatment.  But reports chronic diarrhea 5-6 bowel movement daily with lower abdominal pain.  Reports history of UC or diverticulitis but no documentation available  Without colonoscopy cannot disease except and since patient is symptomatic diarrhea and abdominal pain colonoscopy is indicated to evaluate for any active disease  This also could be irritable bowel syndrome diarrhea type for which we will start the patient and Metamucil twice daily  #GERD  EGD without esophagitis or any lesion Will start the patient on Protonix 40 mg 30 minutes before breakfast for 4 to 6 weeks.  If symptoms persist despite PPI daily EGD with [facility] in future  GERD recommendations:  1) Avoid coffee, tea, cola beverages, carbonated beverages, spicy foods, greasy foods, foods high in acid content (e.g. tomatoes and citrus fruits), chocolate, and peppermint 2) Avoid drinking alcoholic beverages 3) Avoid smoking 4) Eat small meals and keep weight within normal range 5) Avoid recumbent posture for 3 hours post-prandially 6) Elevate head of bed  All questions were answered.      Vista Lawman, MD Gastroenterology and Hepatology [facility]   This chart has been completed using [facility]  Dictation software, and while attempts have been made to ensure accuracy , certain words and phrases may not be transcribed as intended

## 2024-02-18 NOTE — H&P (View-Only) (Signed)
 Vista Lawman , M.D. Gastroenterology & Hepatology [facility] Gastroenterology [address] [place], [place] [address] Primary Care Physician: Norm Salt, [place] [address] [place] [address]  Chief Complaint: Abdominal pain with diarrhea, history of colitis, dysfunctional gallbladder, GERD  History of Present Illness:  Jeanne Mcneil is a 38 y.o. female with anxiety, depression, diverticulitis, GERD, glaucoma, reported history of colitis (per chart UC, per patient, remote, intermittent episodes of colitis)  who presents for evaluation of Abdominal pain with diarrhea, history of colitis, dysfunctional gallbladder, GERD  Patient has recurrent episodes of abdominal pain with nausea and vomiting and underwent upper endoscopy 2 months back any cause to be found.  Recently had a HIDA scan which demonstrated occasional contraction of the gallbladder and patient was planned for cholecystectomy 4/22  Patient reports frequent episodes of lower abdominal pain as well as weight frequent bowel movements which are liquid 4-5 episodes daily patient has reported unintentional weight loss during the same time  Abdominal ultrasound shows mildly thickened gallbladder walls and gallbladder sludge, no stones, normal CBD  She also had a HIDA scan in 2018 with low EF at 21% but patent cystic duct Had numerous CTs over the past years, and also has history of nephrolithiasis.   Last EGD:12/2024   - Single EG junction erosion and otherwise normal esophagus. - Normal stomach. - Normal examined duodenum. - No specimens collected. Suspect that this patient with a history of cyclic vomiting syndrome ( which was more active years ago) , now protracted postinfectious syndromes that are expected to slowly improve.  Recommend metoclopramide 5 mg by mouth 3 times daily before meals for the next 14 days. As needed ondansetron thereafter.  Last Colonoscopy:none  FHx:  neg for any gastrointestinal/liver disease, no malignancies anxiety, depression, diverticulitis, GERD, glaucoma, reported history of colitis (per chart UC, per patient, remote, intermittent episodes of colitis)  Surgical: Hysterectomy  Labs from diabetes 12/2023 normal liver enzymes hemoglobin 12.4  Past Medical History: Past Medical History:  Diagnosis Date   Anxiety    Back pain    Constipation    Depression    Diverticulitis 10/31/2017   GERD (gastroesophageal reflux disease)    Glaucoma of both eyes    Hematuria    History of abnormal cervical Pap smear    History of chronic gastritis    History of kidney stones    Hydronephrosis, right    Renal calculi    bilateral per ct 0902-2017   Right ureteral stone    Trichimoniasis 09/05/2018   Ulcerative colitis (HCC)    Urgency of urination    Uterine fibroid    Uterine polyp    Vaginal Pap smear, abnormal     Past Surgical History: Past Surgical History:  Procedure Laterality Date   CYSTOSCOPY W/ URETERAL STENT PLACEMENT Right 07/04/2016   Procedure: CYSTOSCOPY WITH RIGHT RETROGRADE PYELOGRAM, RIGHT URETERAL STENT PLACEMENT;  Surgeon: Crist Fat, MD;  Location: [facility];  Service: Urology;  Laterality: Right;   CYSTOSCOPY WITH RETROGRADE PYELOGRAM, URETEROSCOPY AND STENT PLACEMENT Left 11/28/2007   CYSTOSCOPY WITH RETROGRADE PYELOGRAM, URETEROSCOPY AND STENT PLACEMENT Right 08/02/2016   Procedure: CYSTOSCOPY WITH RIGHT  RETROGRADE PYELOGRAM, URETEROSCOPY,  AND STENT EXCHANGE;  Surgeon: Crist Fat, MD;  Location: [facility];  Service: Urology;  Laterality: Right;   ESOPHAGOGASTRODUODENOSCOPY N/A 05/17/2016   Dr. Darrick Penna: LA Grade A esophagitis, mild gastritis s/p biopsy noting reactive gastritis, medium sized hiatal hernia, next EGD  WITH PROPOFOL    ESOPHAGOGASTRODUODENOSCOPY (EGD) WITH PROPOFOL N/A 01/16/2024   Procedure: ESOPHAGOGASTRODUODENOSCOPY (EGD) WITH PROPOFOL;  Surgeon: Sherrilyn Rist,  MD;  Location: [facility];  Service: Gastroenterology;  Laterality: N/A;   INDUCED ABORTION  03-17-2008   VAGINAL HYSTERECTOMY Bilateral 08/10/2021   Procedure: HYSTERECTOMY VAGINAL;  Surgeon: Lazaro Arms, MD;  Location: [facility];  Service: Gynecology;  Laterality: Bilateral;    Family History: Family History  Problem Relation Age of Onset   Colon cancer Paternal Uncle        Passed away Mar 18, 2015   Nephrolithiasis Father    Hypertension Father    Nephrolithiasis Sister    Nephrolithiasis Sister    Nephrolithiasis Sister    Hypertension Mother    Leukemia Mother    Colon cancer Maternal Grandfather    Club foot Son     Social History: Social History   Tobacco Use  Smoking Status Every Day   Current packs/day: 1.00   Average packs/day: 1 pack/day for 20.0 years (20.0 ttl pk-yrs)   Types: Cigarettes  Smokeless Tobacco Never   Social History   Substance and Sexual Activity  Alcohol Use No   Alcohol/week: 4.0 standard drinks of alcohol   Types: 4 Shots of liquor per week   Comment: states has not used in 2 years   Social History   Substance and Sexual Activity  Drug Use Not Currently   Types: Marijuana    Allergies: Allergies  Allergen Reactions   Nsaids Other (See Comments)    Stomach bleeding   Tape Other (See Comments)    Reaction:  Skin blisters and burns. Use paper tape only!!   Keflex [Cephalexin] Other (See Comments)    Reaction:  Bladder spasms   Toradol [Ketorolac Tromethamine] Other (See Comments)    Told not to take it due to history of kidney failure   Codeine Hives and Itching   Vicodin [Hydrocodone-Acetaminophen] Hives and Itching    Medications: Current Outpatient Medications  Medication Sig Dispense Refill   baclofen (LIORESAL) 20 MG tablet Take 20 mg by mouth 3 (three) times daily.     metoCLOPramide (REGLAN) 5 MG tablet Take 1 tablet (5 mg total) by mouth 3 (three) times daily before meals for 14 days. 42 tablet 0   ondansetron (ZOFRAN) 4 MG  tablet Take 1 tablet (4 mg total) by mouth every 8 (eight) hours as needed for nausea or vomiting. 20 tablet 0   No current facility-administered medications for this visit.    Review of Systems: GENERAL: negative for malaise, night sweats HEENT: No changes in hearing or vision, no nose bleeds or other nasal problems. NECK: Negative for lumps, goiter, pain and significant neck swelling RESPIRATORY: Negative for cough, wheezing CARDIOVASCULAR: Negative for chest pain, leg swelling, palpitations, orthopnea GI: SEE HPI MUSCULOSKELETAL: Negative for joint pain or swelling, back pain, and muscle pain. SKIN: Negative for lesions, rash HEMATOLOGY Negative for prolonged bleeding, bruising easily, and swollen nodes. ENDOCRINE: Negative for cold or heat intolerance, polyuria, polydipsia and goiter. NEURO: negative for tremor, gait imbalance, syncope and seizures. The remainder of the review of systems is noncontributory.   Physical Exam: BP 128/76 (BP Location: Right Arm, Patient Position: Sitting, Cuff Size: Large)   Pulse 85   Temp (!) 97.5 F (36.4 C) (Temporal)   Ht 5\' 2"  (1.575 m)   Wt 160 lb 6.4 oz (72.8 kg)   LMP 06/30/2021   BMI 29.34 kg/m  GENERAL: The patient is AO x3, in  no acute distress. HEENT: Head is normocephalic and atraumatic. EOMI are intact. Mouth is well hydrated and without lesions. NECK: Supple. No masses LUNGS: Clear to auscultation. No presence of rhonchi/wheezing/rales. Adequate chest expansion HEART: RRR, normal s1 and s2. ABDOMEN: Soft, nontender, no guarding, no peritoneal signs, and nondistended. BS +. No masses.   Imaging/Labs: as above     Latest Ref Rng & Units 01/17/2024    6:00 AM 01/15/2024    6:36 AM 01/14/2024   12:14 PM  CBC  WBC 4.0 - 10.5 K/uL 8.5  10.5    Hemoglobin 12.0 - 15.0 g/dL 54.0  98.1  19.1   Hematocrit 36.0 - 46.0 % 37.2  41.9  51.0   Platelets 150 - 400 K/uL 240  288     No results found for: "IRON", "TIBC", "FERRITIN"  I  personally reviewed and interpreted the available labs, imaging and endoscopic files.  HIDA scan  IMPRESSION: 1. Patent cystic duct and common bile duct. 2. Gallbladder failed to contract with fatty meal stimulation. Findings suggest chronic cholecystitis  Impression and Plan:  Jeanne Mcneil is a 38 y.o. female with anxiety, depression, diverticulitis, GERD, glaucoma, reported history of colitis (per chart UC, per patient, remote, intermittent episodes of colitis)  who presents for evaluation of Abdominal pain with diarrhea, history of colitis, dysfunctional gallbladder, GERD  #Diarrhea with abdominal pain #History of Colitis  Currently patient is on antibiotics for MRSA treatment.  But reports chronic diarrhea 5-6 bowel movement daily with lower abdominal pain.  Reports history of UC or diverticulitis but no documentation available  Without colonoscopy cannot disease except and since patient is symptomatic diarrhea and abdominal pain colonoscopy is indicated to evaluate for any active disease  This also could be irritable bowel syndrome diarrhea type for which we will start the patient and Metamucil twice daily  #GERD  EGD without esophagitis or any lesion Will start the patient on Protonix 40 mg 30 minutes before breakfast for 4 to 6 weeks.  If symptoms persist despite PPI daily EGD with [facility] in future  GERD recommendations:  1) Avoid coffee, tea, cola beverages, carbonated beverages, spicy foods, greasy foods, foods high in acid content (e.g. tomatoes and citrus fruits), chocolate, and peppermint 2) Avoid drinking alcoholic beverages 3) Avoid smoking 4) Eat small meals and keep weight within normal range 5) Avoid recumbent posture for 3 hours post-prandially 6) Elevate head of bed  All questions were answered.      Vista Lawman, MD Gastroenterology and Hepatology [facility]   This chart has been completed using [facility]  Dictation software, and while attempts have been made to ensure accuracy , certain words and phrases may not be transcribed as intended

## 2024-02-20 ENCOUNTER — Encounter (HOSPITAL_COMMUNITY)
Admission: RE | Admit: 2024-02-20 | Discharge: 2024-02-20 | Disposition: A | Discharge status: Home / Self Care | Source: Ambulatory Visit | Attending: Gastroenterology | Admitting: Gastroenterology

## 2024-02-21 ENCOUNTER — Telehealth (INDEPENDENT_AMBULATORY_CARE_PROVIDER_SITE_OTHER): Payer: Self-pay | Admitting: *Deleted

## 2024-02-21 ENCOUNTER — Encounter (HOSPITAL_COMMUNITY)
Admission: RE | Disposition: A | Discharge status: Home / Self Care | Payer: Self-pay | Source: Ambulatory Visit | Attending: Gastroenterology

## 2024-02-21 ENCOUNTER — Ambulatory Visit (HOSPITAL_COMMUNITY): Admitting: Anesthesiology

## 2024-02-21 ENCOUNTER — Encounter (HOSPITAL_COMMUNITY): Payer: Self-pay | Admitting: Gastroenterology

## 2024-02-21 ENCOUNTER — Encounter (INDEPENDENT_AMBULATORY_CARE_PROVIDER_SITE_OTHER): Payer: Self-pay | Admitting: *Deleted

## 2024-02-21 ENCOUNTER — Ambulatory Visit (HOSPITAL_COMMUNITY)
Admission: RE | Admit: 2024-02-21 | Discharge: 2024-02-21 | Disposition: A | Discharge status: Home / Self Care | Source: Ambulatory Visit | Attending: Gastroenterology | Admitting: Gastroenterology

## 2024-02-21 ENCOUNTER — Ambulatory Visit (HOSPITAL_BASED_OUTPATIENT_CLINIC_OR_DEPARTMENT_OTHER): Admitting: Anesthesiology

## 2024-02-21 DIAGNOSIS — K648 Other hemorrhoids: Secondary | ICD-10-CM

## 2024-02-21 DIAGNOSIS — F17200 Nicotine dependence, unspecified, uncomplicated: Secondary | ICD-10-CM | POA: Diagnosis not present

## 2024-02-21 DIAGNOSIS — K529 Noninfective gastroenteritis and colitis, unspecified: Secondary | ICD-10-CM | POA: Diagnosis not present

## 2024-02-21 DIAGNOSIS — Z6829 Body mass index (BMI) 29.0-29.9, adult: Secondary | ICD-10-CM | POA: Diagnosis not present

## 2024-02-21 DIAGNOSIS — K219 Gastro-esophageal reflux disease without esophagitis: Secondary | ICD-10-CM | POA: Diagnosis not present

## 2024-02-21 DIAGNOSIS — K640 First degree hemorrhoids: Secondary | ICD-10-CM

## 2024-02-21 DIAGNOSIS — R1084 Generalized abdominal pain: Secondary | ICD-10-CM | POA: Insufficient documentation

## 2024-02-21 DIAGNOSIS — E669 Obesity, unspecified: Secondary | ICD-10-CM | POA: Insufficient documentation

## 2024-02-21 DIAGNOSIS — K649 Unspecified hemorrhoids: Secondary | ICD-10-CM | POA: Diagnosis not present

## 2024-02-21 DIAGNOSIS — Z91198 Patient's noncompliance with other medical treatment and regimen for other reason: Secondary | ICD-10-CM | POA: Diagnosis not present

## 2024-02-21 HISTORY — PX: COLONOSCOPY: SHX5424

## 2024-02-21 LAB — HM COLONOSCOPY

## 2024-02-21 SURGERY — COLONOSCOPY
Anesthesia: General

## 2024-02-21 MED ORDER — LACTATED RINGERS IV SOLN
INTRAVENOUS | Status: DC | PRN
Start: 2024-02-21 — End: 2024-02-21

## 2024-02-21 MED ORDER — LIDOCAINE HCL (PF) 2 % IJ SOLN
INTRAMUSCULAR | Status: DC | PRN
  Administered 2024-02-21: 60 mg via INTRADERMAL

## 2024-02-21 MED ORDER — DEXMEDETOMIDINE HCL IN NACL 80 MCG/20ML IV SOLN
INTRAVENOUS | Status: AC
Start: 2024-02-21 — End: ?
  Filled 2024-02-21: qty 20

## 2024-02-21 MED ORDER — PROPOFOL 1000 MG/100ML IV EMUL
INTRAVENOUS | Status: AC
  Filled 2024-02-21: qty 100

## 2024-02-21 MED ORDER — LIDOCAINE HCL (PF) 2 % IJ SOLN
INTRAMUSCULAR | Status: AC
  Filled 2024-02-21: qty 5

## 2024-02-21 MED ORDER — PROPOFOL 500 MG/50ML IV EMUL
INTRAVENOUS | Status: DC | PRN
  Administered 2024-02-21: 150 ug/kg/min via INTRAVENOUS

## 2024-02-21 MED ORDER — GLYCOPYRROLATE PF 0.2 MG/ML IJ SOSY
PREFILLED_SYRINGE | INTRAMUSCULAR | Status: AC
  Filled 2024-02-21: qty 1

## 2024-02-21 MED ORDER — DEXMEDETOMIDINE HCL IN NACL 80 MCG/20ML IV SOLN
INTRAVENOUS | Status: DC | PRN
  Administered 2024-02-21: 6 ug via INTRAVENOUS

## 2024-02-21 MED ORDER — PROPOFOL 10 MG/ML IV BOLUS
INTRAVENOUS | Status: DC | PRN
  Administered 2024-02-21: 80 mg via INTRAVENOUS

## 2024-02-21 MED ORDER — GLYCOPYRROLATE PF 0.2 MG/ML IJ SOSY
PREFILLED_SYRINGE | INTRAMUSCULAR | Status: DC | PRN
Start: 2024-02-21 — End: 2024-02-21
  Administered 2024-02-21: .1 mg via INTRAVENOUS

## 2024-02-21 MED ORDER — LINACLOTIDE 145 MCG PO CAPS: 145.0000 ug | ORAL_CAPSULE | Freq: Every day | ORAL | 5 refills | Status: DC

## 2024-02-21 NOTE — Transfer of Care (Addendum)
 Immediate Anesthesia Transfer of Care Note  Patient: Jeanne Mcneil  Procedure(s) Performed: COLONOSCOPY  Patient Location: Short Stay  Anesthesia Type:General  Level of Consciousness: drowsy and patient cooperative  Airway & Oxygen Therapy: Patient Spontanous Breathing  Post-op Assessment: Report given to RN and Post -op Vital signs reviewed and stable  Post vital signs: Reviewed and stable  Last Vitals:  Vitals Value Taken Time  BP 119/80 02/21/24   1138  Temp 36.6 02/21/24   1138  Pulse 63 02/21/24   1138  Resp 12 02/21/24   1138  SpO2 100% 02/21/24   1138    Last Pain:  Vitals:   02/21/24 1117  TempSrc:   PainSc: 3          Complications: No notable events documented.

## 2024-02-21 NOTE — Op Note (Signed)
 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 Estimated Blood Loss:     Estimated blood loss: none. Procedure:                Pre-Anesthesia Assessment:                           - Prior to the procedure, a History and Physical                            was performed, and patient medications and                            allergies were reviewed. The patient's tolerance of                            previous anesthesia was also reviewed. The risks                            and benefits of the procedure and the sedation                            options and risks were discussed with the patient.                            All questions were answered, and informed consent                            was obtained. Prior Anticoagulants: The patient has                            taken no anticoagulant or antiplatelet agents. ASA                            Grade  Assessment: II - A patient with mild systemic                            disease. After reviewing the risks and benefits,                            the patient was deemed in satisfactory condition to                            undergo the procedure.                           After obtaining informed consent, the colonoscope                            was passed  under direct vision. Throughout the                            procedure, the patient's blood pressure, pulse, and                            oxygen saturations were monitored continuously. The                            [id number]-HQ190L ([id number]) scope was introduced through                            the anus and advanced to the the ileocecal valve.                            The colonoscopy was performed without difficulty.                            The patient tolerated the procedure well. The                            quality of the bowel preparation was evaluated                            using the BBPS [facility] Bowel Preparation Scale)                            with scores of: Right Colon = 1 (portion of mucosa                            seen, but other areas not well seen due to                            staining, residual stool and/or opaque liquid),                            Transverse Colon = 1 (portion of mucosa seen, but  other areas not well seen due to staining, residual                            stool and/or opaque liquid) and Left Colon = 1                            (portion of mucosa seen, but other areas not well                            seen due to staining, residual stool and/or opaque                            liquid). The total BBPS score equals 3. The quality                            of the bowel preparation was inadequate. The                            ileocecal valve and the rectum were photographed. Scope In: 11:24:50 AM Scope Out: 11:31:50 AM Scope Withdrawal Time: 0 hours 3 minutes 35 seconds  Total Procedure Duration: 0 hours 7 minutes 0 seconds  Findings:      A large amount of solid stool was found in the entire colon, precluding       visualization. Lavage of the area was performed using a large amount of       sterile water, resulting in incomplete clearance with continued poor       visualization.      There is no  endoscopic evidence of mass in the entire colon.      Non-bleeding internal hemorrhoids were found during retroflexion. The       hemorrhoids were small. Impression:               - Preparation of the colon was inadequate.                           - Stool in the entire examined colon.                           - Non-bleeding internal hemorrhoids.                           - No specimens collected. Moderate Sedation:      Per Anesthesia Care Recommendation:           - Patient has a contact number available for                            emergencies. The signs and symptoms of potential                            delayed complications were discussed with the                            patient. Return to normal activities tomorrow.  Written discharge instructions were provided to the                            patient.                           - High fiber diet.                           - Continue present medications.                           - Repeat colonoscopy at next available appointment                            (within 3 months) because the examination was                            incomplete. Few months after cholecystectomy which                            patient is scheduled . Extended bowel prep                           - Return to GI office as previously scheduled.                           -Despite bowel prep having large amount of stool                            indicates patient rather has chronic constipation                            and likely has overflow diarrhea                           -Will start Linzess Procedure Code(s):        --- Professional ---                           [phone], Colonoscopy, flexible; diagnostic, including                            collection of specimen(s) by brushing or washing,                            when performed (separate procedure) Diagnosis Code(s):        --- Professional ---                            K74.2, Other hemorrhoids                           R10.84, Generalized abdominal pain                           K52.9, Noninfective gastroenteritis  and colitis,                            unspecified CPT copyright 2022 American Medical Association. All rights reserved. The codes documented in this report are preliminary and upon coder review may  be revised to meet current compliance requirements. Sanjuan Dame, MD Sanjuan Dame, MD 02/21/2024 11:43:19 AM This report has been signed electronically. Number of Addenda: 0

## 2024-02-21 NOTE — Anesthesia Postprocedure Evaluation (Signed)
 Anesthesia Post Note  Patient: Jeanne Mcneil  Procedure(s) Performed: COLONOSCOPY  Patient location during evaluation: PACU Anesthesia Type: General Level of consciousness: awake and alert Pain management: pain level controlled Vital Signs Assessment: post-procedure vital signs reviewed and stable Respiratory status: spontaneous breathing, nonlabored ventilation, respiratory function stable and patient connected to nasal cannula oxygen Cardiovascular status: blood pressure returned to baseline and stable Postop Assessment: no apparent nausea or vomiting Anesthetic complications: no   There were no known notable events for this encounter.   Last Vitals:  Vitals:   02/21/24 0928 02/21/24 1138  BP: 116/74 119/80  Pulse: 63 63  Resp: 10 12  Temp: 36.7 C 36.6 C  SpO2: 94% 100%    Last Pain:  Vitals:   02/21/24 1138  TempSrc: Oral  PainSc: 6                  Jennel Mara L Pricella Gaugh

## 2024-02-21 NOTE — Discharge Instructions (Signed)

## 2024-02-21 NOTE — Anesthesia Preprocedure Evaluation (Signed)
 Anesthesia Evaluation  Patient identified by MRN, date of birth, ID band Patient awake    Reviewed: Allergy & Precautions, NPO status , Patient's Chart, lab work & pertinent test results  Airway Mallampati: II  TM Distance: >3 FB Neck ROM: Full    Dental  (+) Teeth Intact, Dental Advisory Given   Pulmonary Current Smoker and Patient abstained from smoking.   Pulmonary exam normal breath sounds clear to auscultation       Cardiovascular Exercise Tolerance: Good negative cardio ROS Normal cardiovascular exam Rhythm:Regular Rate:Normal     Neuro/Psych Seizures -,  PSYCHIATRIC DISORDERS Anxiety Depression    glaucoma    GI/Hepatic Neg liver ROS, PUD,GERD  ,,Nausea vomiting, epigastric pain. UC   Endo/Other  negative endocrine ROS  Obesity   Renal/GU Renal disease     Musculoskeletal negative musculoskeletal ROS (+)    Abdominal   Peds  Hematology negative hematology ROS (+)   Anesthesia Other Findings Day of surgery medications reviewed with the patient.  Reproductive/Obstetrics negative OB ROS                             Anesthesia Physical Anesthesia Plan  ASA: 2  Anesthesia Plan: General   Post-op Pain Management: Minimal or no pain anticipated   Induction: Intravenous  PONV Risk Score and Plan: 1 and Propofol infusion  Airway Management Planned: Natural Airway and Nasal Cannula  Additional Equipment: None  Intra-op Plan:   Post-operative Plan:   Informed Consent: I have reviewed the patients History and Physical, chart, labs and discussed the procedure including the risks, benefits and alternatives for the proposed anesthesia with the patient or authorized representative who has indicated his/her understanding and acceptance.     Dental advisory given  Plan Discussed with: CRNA  Anesthesia Plan Comments:         Anesthesia Quick Evaluation

## 2024-02-21 NOTE — Telephone Encounter (Signed)
 Repeat colonoscopy at next available appointment (within 3 months) because the examination was     incomplete. Few months after cholecystectomy which patient is scheduled . Extended bowel prep

## 2024-02-21 NOTE — Telephone Encounter (Addendum)
 -----   Message from Franky Macho sent at 02/21/2024 11:43 AM EDT ----- Regarding: linzess Hi Toniann Fail  Can you please give this patient samples of linzess either 145 or , she will come by   Yes, Linzess 145 mcg placed at front window for pickup.

## 2024-02-21 NOTE — Interval H&P Note (Signed)
 History and Physical Interval Note:  02/21/2024 9:44 AM  Jeanne Mcneil  has presented today for surgery, with the diagnosis of diarrhea, colitis.  The various methods of treatment have been discussed with the patient and family. After consideration of risks, benefits and other options for treatment, the patient has consented to  Procedure(s) with comments: COLONOSCOPY (N/A) - 8:15am;asa 1-2 as a surgical intervention.  The patient's history has been reviewed, patient examined, no change in status, stable for surgery.  I have reviewed the patient's chart and labs.  Questions were answered to the patient's satisfaction.     Juanetta Beets Alys Dulak

## 2024-02-22 ENCOUNTER — Encounter (HOSPITAL_COMMUNITY): Payer: Self-pay | Admitting: Gastroenterology

## 2024-02-26 LAB — CELIAC AB TTG DGP TIGA
Antigliadin Abs, IgA: 5 U (ref 0–19)
Gliadin IgG: 1 U (ref 0–19)
IgA/Immunoglobulin A, Serum: 251 mg/dL (ref 87–352)
Tissue Transglut Ab: <2 U/mL (ref 0–5)
Transglutaminase IgA: <2 U/mL (ref 0–3)

## 2024-02-26 LAB — ALPHA-GAL PANEL
Allergen Lamb IgE: <0.1 kU/L
Beef IgE: <0.1 kU/L
IgE (Immunoglobulin E), Serum: 42 [IU]/mL (ref 6–495)
O215-IgE Alpha-Gal: <0.1 kU/L
Pork IgE: <0.1 kU/L

## 2024-02-26 LAB — C-REACTIVE PROTEIN: CRP: <1 mg/L (ref 0–10)

## 2024-02-26 LAB — FERRITIN: Ferritin: 73 ng/mL (ref 15–150)

## 2024-02-28 ENCOUNTER — Encounter (INDEPENDENT_AMBULATORY_CARE_PROVIDER_SITE_OTHER): Payer: Self-pay | Admitting: Gastroenterology

## 2024-02-28 NOTE — Progress Notes (Signed)
 Labs reviewed   CRP <1 Ferritin :73 Negative celiac panel No Alpha Gal Awaiting stool sample

## 2024-02-29 NOTE — Progress Notes (Signed)
 Surgical Instructions   Your procedure is scheduled on Tuesday, March 11, 2024. Report to [facility] Main Entrance "A" at 10:30 A.M., then check in with the Admitting office. Any questions or running late day of surgery: call [phone]  Questions prior to your surgery date: call [phone], Monday-Friday, 8am-4pm. If you experience any cold or flu symptoms such as cough, fever, chills, shortness of breath, etc. between now and your scheduled surgery, please notify us at the above number.     Remember:  Do not eat after midnight the night before your surgery   You may drink clear liquids until 9:30 the morning of your surgery.   Clear liquids allowed are: Water, Non-Citrus Juices (without pulp), Carbonated Beverages, Clear Tea (no milk, honey, etc.), Black Coffee Only (NO MILK, CREAM OR POWDERED CREAMER of any kind), and Gatorade.    Take these medicines the morning of surgery with A SIP OF WATER  pantoprazole (PROTONIX)    May take these medicines IF NEEDED: acetaminophen (TYLENOL)  baclofen (LIORESAL)  metoCLOPramide (REGLAN)  ondansetron (ZOFRAN)   One week prior to surgery, STOP taking any Aspirin (unless otherwise instructed by your surgeon) Aleve, Naproxen, Ibuprofen, Motrin, Advil, Goody's, BC's, all herbal medications, fish oil, and non-prescription vitamins.                     Do NOT Smoke (Tobacco/Vaping) for 24 hours prior to your procedure.  If you use a CPAP at night, you may bring your mask/headgear for your overnight stay.   You will be asked to remove any contacts, glasses, piercing's, hearing aid's, dentures/partials prior to surgery. Please bring cases for these items if needed.    Patients discharged the day of surgery will not be allowed to drive home, and someone needs to stay with them for 24 hours.  SURGICAL WAITING ROOM VISITATION Patients may have no more than 2 support people in the waiting area - these visitors may rotate.   Pre-op nurse will  coordinate an appropriate time for 1 ADULT support person, who may not rotate, to accompany patient in pre-op.  Children under the age of 59 must have an adult with them who is not the patient and must remain in the main waiting area with an adult.  If the patient needs to stay at the hospital during part of their recovery, the visitor guidelines for inpatient rooms apply.  Please refer to the [facility] website for the visitor guidelines for any additional information.   If you received a COVID test during your pre-op visit  it is requested that you wear a mask when out in public, stay away from anyone that may not be feeling well and notify your surgeon if you develop symptoms. If you have been in contact with anyone that has tested positive in the last 10 days please notify you surgeon.      Pre-operative CHG Bathing Instructions   You can play a key role in reducing the risk of infection after surgery. Your skin needs to be as free of germs as possible. You can reduce the number of germs on your skin by washing with CHG (chlorhexidine gluconate) soap before surgery. CHG is an antiseptic soap that kills germs and continues to kill germs even after washing.   DO NOT use if you have an allergy to chlorhexidine/CHG or antibacterial soaps. If your skin becomes reddened or irritated, stop using the CHG and notify one of our RNs at [phone].  TAKE A SHOWER THE NIGHT BEFORE SURGERY AND THE DAY OF SURGERY    Please keep in mind the following:  DO NOT shave, including legs and underarms, 48 hours prior to surgery.   You may shave your face before/day of surgery.  Place clean sheets on your bed the night before surgery Use a clean washcloth (not used since being washed) for each shower. DO NOT sleep with pet's night before surgery.  CHG Shower Instructions:  Wash your face and private area with normal soap. If you choose to wash your hair, wash first with your normal shampoo.   After you use shampoo/soap, rinse your hair and body thoroughly to remove shampoo/soap residue.  Turn the water OFF and apply half the bottle of CHG soap to a CLEAN washcloth.  Apply CHG soap ONLY FROM YOUR NECK DOWN TO YOUR TOES (washing for 3-5 minutes)  DO NOT use CHG soap on face, private areas, open wounds, or sores.  Pay special attention to the area where your surgery is being performed.  If you are having back surgery, having someone wash your back for you may be helpful. Wait 2 minutes after CHG soap is applied, then you may rinse off the CHG soap.  Pat dry with a clean towel  Put on clean pajamas    Additional instructions for the day of surgery: DO NOT APPLY any lotions, deodorants, cologne, or perfumes.   Do not wear jewelry or makeup Do not wear nail polish, gel polish, artificial nails, or any other type of covering on natural nails (fingers and toes) Do not bring valuables to the hospital. [facility] is not responsible for valuables/personal belongings. Put on clean/comfortable clothes.  Please brush your teeth.  Ask your nurse before applying any prescription medications to the skin.

## 2024-03-03 ENCOUNTER — Encounter (HOSPITAL_COMMUNITY): Payer: Self-pay

## 2024-03-03 ENCOUNTER — Other Ambulatory Visit: Payer: Self-pay

## 2024-03-03 ENCOUNTER — Encounter (HOSPITAL_COMMUNITY)
Admission: RE | Admit: 2024-03-03 | Discharge: 2024-03-03 | Disposition: A | Discharge status: Home / Self Care | Source: Ambulatory Visit | Attending: General Surgery | Admitting: General Surgery

## 2024-03-03 VITALS — BP 120/76 | HR 73 | Temp 97.9°F | Resp 16 | Ht 62.0 in | Wt 163.0 lb

## 2024-03-03 DIAGNOSIS — Z01818 Encounter for other preprocedural examination: Secondary | ICD-10-CM | POA: Diagnosis not present

## 2024-03-03 DIAGNOSIS — Z01812 Encounter for preprocedural laboratory examination: Secondary | ICD-10-CM | POA: Insufficient documentation

## 2024-03-03 LAB — CBC
HCT: 43.5 % (ref 36.0–46.0)
Hemoglobin: 14.2 g/dL (ref 12.0–15.0)
MCH: 30.9 pg (ref 26.0–34.0)
MCHC: 32.6 g/dL (ref 30.0–36.0)
MCV: 94.6 fL (ref 80.0–100.0)
Platelets: 335 10*3/uL (ref 150–400)
RBC: 4.6 MIL/uL (ref 3.87–5.11)
RDW: 13.4 % (ref 11.5–15.5)
WBC: 9.4 10*3/uL (ref 4.0–10.5)
nRBC: 0 % (ref 0.0–0.2)

## 2024-03-03 LAB — BASIC METABOLIC PANEL WITH GFR
Anion gap: 12 (ref 5–15)
BUN: 9 mg/dL (ref 6–20)
CO2: 22 mmol/L (ref 22–32)
Calcium: 9.1 mg/dL (ref 8.9–10.3)
Chloride: 103 mmol/L (ref 98–111)
Creatinine, Ser: 0.84 mg/dL (ref 0.44–1.00)
GFR, Estimated: >60 mL/min (ref >60)
Glucose, Bld: 148 mg/dL — ABNORMAL HIGH (ref 70–99)
Potassium: 3.9 mmol/L (ref 3.5–5.1)
Sodium: 137 mmol/L (ref 135–145)

## 2024-03-03 NOTE — Progress Notes (Signed)
 PCP -  Vernette Goo  Cardiologist - Denies  PPM/ICD - Denies Device Orders - N/A Rep Notified - N/A  Chest x-ray - N/A EKG - 01-15-24 Stress Test - Denies ECHO - Denies Cardiac Cath - Denies  Sleep Study -Denies  CPAP - N/A  Fasting Blood Sugar - Denies Checks Blood Sugar times a day: N/A  Last dose of GLP1 agonist-  Denies GLP1 instructions: N/A  Blood Thinner Instructions:Denies Aspirin Instructions: Denies taking  ERAS Protcol - ERAS - clears until 9:30 PRE-SURGERY Ensure or G2- none  COVID TEST- n/a   Anesthesia review: n/a  Patient denies shortness of breath, fever, cough and chest pain at PAT appointment. Patient denies any respiratory issues at this time.    All instructions explained to the patient, with a verbal understanding of the material. Patient agrees to go over the instructions while at home for a better understanding. Patient also instructed to self quarantine after being tested for COVID-19. The opportunity to ask questions was provided.

## 2024-03-10 ENCOUNTER — Ambulatory Visit: Payer: Self-pay | Admitting: General Surgery

## 2024-03-10 NOTE — H&P (View-Only) (Signed)
 History of Present Illness: Jeanne Mcneil is a 38 y.o. female who is seen today for follow-up for hyperparathyroidism.   Patient's had a history of elevated calciums as well as PTH.  Patient had a 4D CT scan back in July which revealed a right upper parathyroid adenoma. Patient on that CT scan did show some carotid stenosis and plaques.  She followed up with vascular surgery.  There is no plans for any type of intervention at this time.             Review of Systems: A complete review of systems was obtained from the patient.  I have reviewed this information and discussed as appropriate with the patient.  See HPI as well for other ROS.   Review of Systems  Constitutional:  Negative for fever.  HENT:  Negative for congestion.   Eyes:  Negative for blurred vision.  Respiratory:  Negative for cough, shortness of breath and wheezing.   Cardiovascular:  Negative for chest pain and palpitations.  Gastrointestinal:  Negative for heartburn.  Genitourinary:  Negative for dysuria.  Musculoskeletal:  Negative for myalgias.  Skin:  Negative for rash.  Neurological:  Negative for dizziness and headaches.  Psychiatric/Behavioral:  Negative for depression and suicidal ideas.   All other systems reviewed and are negative.       Medical History: Past Medical History Past Medical History: Diagnosis Date  Anemia    Arthritis    Asthma, unspecified asthma severity, unspecified whether complicated, unspecified whether persistent (HHS-HCC)    Chronic kidney disease    Diabetes mellitus without complication (CMS/HHS-HCC)    GERD (gastroesophageal reflux disease)    Hyperlipidemia    Hypertension         Problem List There is no problem list on file for this patient.     Past Surgical History Past Surgical History: Procedure Laterality Date  HEART SURGERY    2002   OPEN  CESAREAN SECTION       1975, 1976, 1980       Allergies Allergies Allergen Reactions  Furosemide Shortness Of Breath  Other Anaphylaxis     Other reaction(s): Unknown  Sulfa  (Sulfonamide Antibiotics) Anaphylaxis  Procaine Other (See Comments)     "adrenal reaction"      Medications Ordered Prior to Encounter Current Outpatient Medications on File Prior to Visit Medication Sig Dispense Refill  albuterol  MDI, PROVENTIL , VENTOLIN , PROAIR , HFA 90 mcg/actuation inhaler INHALE TWO PUFFS BY MOUTH INTO LUNGS EVERY 6 HOURS AS NEEDED wheezing AND FOR SHORTNESS OF BREATH      ALLERGY RELIEF, CETIRIZINE, 10 mg tablet Take 10 mg by mouth every morning      allopurinoL (ZYLOPRIM) 100 MG tablet Take 100 mg by mouth once daily      aspirin (ASPIR-81 ORAL)        atorvastatin (LIPITOR) 80 MG tablet Take 80 mg by mouth once daily      cholecalciferol (VITAMIN D3) 1000 unit tablet Take by mouth      cyanocobalamin (VITAMIN B12) 500 MCG tablet Take 500 mcg by mouth once daily      EPINEPHrine  (EPIPEN ) 0.3 mg/0.3 mL auto-injector        evolocumab (REPATHA SURECLICK) 140 mg/mL PnIj Inject subcutaneously      finerenone (KERENDIA) 10 mg Tab Take 1 tablet by mouth once daily      fluticasone propionate (FLONASE) 50 mcg/actuation nasal spray 1 spray.      folic acid (FOLVITE) 400 MCG tablet Take 400 mcg by  mouth once daily      hydroCHLOROthiazide (HYDRODIURIL) 25 MG tablet Take 25 mg by mouth once daily      losartan (COZAAR) 100 MG tablet Take 1 tablet by mouth every morning      metoprolol tartrate (LOPRESSOR) 50 MG tablet Take 50 mg by mouth 2 (two) times daily      montelukast (SINGULAIR) 10 mg tablet Take 10 mg by mouth every evening      mupirocin  (BACTROBAN ) 2 % ointment Apply 1 Application topically 2 (two) times daily      nitroGLYcerin (NITROSTAT) 0.3 MG SL tablet Place 1 tablet under the tongue every 5 (five) minutes as needed      omeprazole  (PRILOSEC) 20 MG DR capsule Take 20 mg by mouth once daily      potassium chloride  (KLOR-CON ) 10  MEQ ER tablet TAKE 1/2 TABLET BY MOUTH TWICE DAILY WITH FOOD      zinc gluconate 50 mg tablet Take 50 mg by mouth once daily        No current facility-administered medications on file prior to visit.      Family History History reviewed. No pertinent family history.     Tobacco Use History Social History    Tobacco Use Smoking Status Former  Current packs/day: 0.00  Types: Cigarettes  Quit date: 2002  Years since quitting: 22.8 Smokeless Tobacco Never      Social History Social History    Socioeconomic History  Marital status: Married Tobacco Use  Smoking status: Former     Current packs/day: 0.00     Types: Cigarettes     Quit date: 2002     Years since quitting: 22.8  Smokeless tobacco: Never Vaping Use  Vaping status: Never Used Substance and Sexual Activity  Alcohol use: Not Currently  Drug use: Never    Social Drivers of Manufacturing engineer Strain: High Risk (04/25/2023)   Received from [facility]   Overall Financial Resource Strain (CARDIA)    Difficulty of Paying Living Expenses: Very hard Food Insecurity: Low Risk  (06/03/2023)   Received from [facility]   Hunger Vital Sign    Worried About Running Out of Food in the Last Year: Never true    Ran Out of Food in the Last Year: Never true Physical Activity: Inactive (04/25/2023)   Received from [facility]   Exercise Vital Sign    Days of Exercise per Week: 0 days    Minutes of Exercise per Session: 0 min Stress: No Stress Concern Present (04/25/2023)   Received from [facility] - Occupational Stress Questionnaire    Feeling of Stress : Not at all Social Connections: Socially Integrated (04/25/2023)   Received from [facility]   Social Connection and Isolation Panel [NHANES]    Frequency of Communication with Friends and Family: More than three times a week    Frequency of Social Gatherings with Friends and Family: Twice a week    Attends  Religious Services: More than 4 times per year    Active Member of [employer] or Organizations: Yes    Attends Banker Meetings: More than 4 times per year    Marital Status: Married Housing Stability: Low Risk  (06/03/2023)   Received from [employer] Stability Vital Sign    What is your living situation today?: I have a steady place to live    Think about the place you live.  Do you have problems with any of the following? Choose all that apply:: None/None on this list      Objective:     Vitals:   10/05/23 1143 BP: (!) 143/79 Pulse: 60 Temp: 36.3 C (97.3 F) Weight: 84.5 kg (186 lb 3.2 oz) Height: 156.2 cm (5' 1.5") PainSc: 0-No pain   Body mass index is 34.61 kg/m.   Physical Exam Constitutional:      Appearance: Normal appearance.  HENT:     Head: Normocephalic and atraumatic.     Mouth/Throat:     Mouth: Mucous membranes are moist.     Pharynx: Oropharynx is clear.  Eyes:     General: No scleral icterus.    Pupils: Pupils are equal, round, and reactive to light.  Cardiovascular:     Rate and Rhythm: Normal rate and regular rhythm.     Pulses: Normal pulses.     Heart sounds: No murmur heard.    No friction rub. No gallop.  Pulmonary:     Effort: Pulmonary effort is normal. No respiratory distress.     Breath sounds: Normal breath sounds. No stridor.  Abdominal:     General: Abdomen is flat.  Musculoskeletal:        General: No swelling.  Skin:    General: Skin is warm.  Neurological:     General: No focal deficit present.     Mental Status: She is alert and oriented to person, place, and time. Mental status is at baseline.  Psychiatric:        Mood and Affect: Mood normal.        Thought Content: Thought content normal.        Judgment: Judgment normal.          Assessment and Plan:   Assessment Diagnoses and all orders for this visit:   Primary hyperparathyroidism (CMS/HHS-HCC)       1.  We will proceed to the operating  for a minimally invasive right upper parathyroidectomy. 2.  Discussed with her we will plan on keeping her overnight for follow-up with labs and monitoring.  We will have her discharge calcium  and magnesium . 3.  I discussed with her the risk and benefits of the procedure to include but not limited to: Infection, bleeding, damage to surrounding structures, possible recurrent laryngeal nerve damage, possible need for further surgery.  Patient voiced understanding and wishes to proceed.   No follow-ups on file.     Shela Derby, MD

## 2024-03-10 NOTE — H&P (Addendum)
 Error

## 2024-03-10 NOTE — Progress Notes (Signed)
 Patient stated understanding of new arrival time of 1300 tomorrow and clear liquids okay until 1200

## 2024-03-11 ENCOUNTER — Other Ambulatory Visit: Payer: Self-pay

## 2024-03-11 ENCOUNTER — Encounter (HOSPITAL_COMMUNITY): Payer: Self-pay | Admitting: General Surgery

## 2024-03-11 ENCOUNTER — Ambulatory Visit (HOSPITAL_BASED_OUTPATIENT_CLINIC_OR_DEPARTMENT_OTHER): Admitting: Anesthesiology

## 2024-03-11 ENCOUNTER — Ambulatory Visit (HOSPITAL_COMMUNITY): Admitting: Anesthesiology

## 2024-03-11 ENCOUNTER — Encounter (HOSPITAL_COMMUNITY)
Admission: RE | Disposition: A | Discharge status: Home / Self Care | Payer: Self-pay | Source: Home / Self Care | Attending: General Surgery

## 2024-03-11 ENCOUNTER — Ambulatory Visit (HOSPITAL_COMMUNITY)
Admission: RE | Admit: 2024-03-11 | Discharge: 2024-03-11 | Disposition: A | Discharge status: Home / Self Care | Attending: General Surgery | Admitting: General Surgery

## 2024-03-11 DIAGNOSIS — Z881 Allergy status to other antibiotic agents status: Secondary | ICD-10-CM | POA: Insufficient documentation

## 2024-03-11 DIAGNOSIS — B9562 Methicillin resistant Staphylococcus aureus infection as the cause of diseases classified elsewhere: Secondary | ICD-10-CM | POA: Diagnosis not present

## 2024-03-11 DIAGNOSIS — K219 Gastro-esophageal reflux disease without esophagitis: Secondary | ICD-10-CM | POA: Insufficient documentation

## 2024-03-11 DIAGNOSIS — K811 Chronic cholecystitis: Secondary | ICD-10-CM | POA: Diagnosis not present

## 2024-03-11 DIAGNOSIS — L089 Local infection of the skin and subcutaneous tissue, unspecified: Secondary | ICD-10-CM | POA: Diagnosis not present

## 2024-03-11 DIAGNOSIS — Z8711 Personal history of peptic ulcer disease: Secondary | ICD-10-CM | POA: Insufficient documentation

## 2024-03-11 DIAGNOSIS — E669 Obesity, unspecified: Secondary | ICD-10-CM | POA: Diagnosis not present

## 2024-03-11 DIAGNOSIS — Z6829 Body mass index (BMI) 29.0-29.9, adult: Secondary | ICD-10-CM | POA: Insufficient documentation

## 2024-03-11 DIAGNOSIS — K828 Other specified diseases of gallbladder: Secondary | ICD-10-CM | POA: Diagnosis not present

## 2024-03-11 DIAGNOSIS — K8010 Calculus of gallbladder with chronic cholecystitis without obstruction: Secondary | ICD-10-CM | POA: Diagnosis not present

## 2024-03-11 HISTORY — PX: CHOLECYSTECTOMY: SHX55

## 2024-03-11 SURGERY — LAPAROSCOPIC CHOLECYSTECTOMY
Anesthesia: General | Site: Abdomen

## 2024-03-11 MED ORDER — HYDROMORPHONE HCL 1 MG/ML IJ SOLN
INTRAMUSCULAR | Status: AC
  Filled 2024-03-11: qty 1

## 2024-03-11 MED ORDER — DEXMEDETOMIDINE HCL IN NACL 80 MCG/20ML IV SOLN
INTRAVENOUS | Status: AC
  Filled 2024-03-11: qty 20

## 2024-03-11 MED ORDER — BUPIVACAINE HCL 0.25 % IJ SOLN
INTRAMUSCULAR | Status: DC | PRN
  Administered 2024-03-11: 10 mL

## 2024-03-11 MED ORDER — ORAL CARE MOUTH RINSE: 15.0000 mL | Freq: Once | OROMUCOSAL | Status: AC

## 2024-03-11 MED ORDER — PROPOFOL 10 MG/ML IV BOLUS
INTRAVENOUS | Status: AC
  Filled 2024-03-11: qty 20

## 2024-03-11 MED ORDER — DEXAMETHASONE SODIUM PHOSPHATE 10 MG/ML IJ SOLN
INTRAMUSCULAR | Status: DC | PRN
  Administered 2024-03-11: 10 mg via INTRAVENOUS

## 2024-03-11 MED ORDER — OXYCODONE HCL 5 MG PO TABS: 5.0000 mg | ORAL_TABLET | ORAL | 0 refills | Status: AC | PRN

## 2024-03-11 MED ORDER — BUPIVACAINE HCL (PF) 0.25 % IJ SOLN
INTRAMUSCULAR | Status: AC
  Filled 2024-03-11: qty 30

## 2024-03-11 MED ORDER — OXYCODONE HCL 5 MG PO TABS
5.0000 mg | ORAL_TABLET | Freq: Once | ORAL | Status: AC | PRN
  Administered 2024-03-11: 5 mg via ORAL

## 2024-03-11 MED ORDER — MIDAZOLAM HCL 2 MG/2ML IJ SOLN
INTRAMUSCULAR | Status: DC | PRN
  Administered 2024-03-11: 2 mg via INTRAVENOUS

## 2024-03-11 MED ORDER — ACETAMINOPHEN 10 MG/ML IV SOLN
INTRAVENOUS | Status: AC
  Filled 2024-03-11: qty 100

## 2024-03-11 MED ORDER — HYDROMORPHONE HCL 1 MG/ML IJ SOLN
0.2500 mg | INTRAMUSCULAR | Status: DC | PRN
  Administered 2024-03-11 (×4): 0.5 mg via INTRAVENOUS

## 2024-03-11 MED ORDER — DROPERIDOL 2.5 MG/ML IJ SOLN
0.6250 mg | Freq: Once | INTRAMUSCULAR | Status: AC | PRN
  Administered 2024-03-11: 0.625 mg via INTRAVENOUS

## 2024-03-11 MED ORDER — PROPOFOL 10 MG/ML IV BOLUS
INTRAVENOUS | Status: DC | PRN
  Administered 2024-03-11: 200 mg via INTRAVENOUS

## 2024-03-11 MED ORDER — MIDAZOLAM HCL 2 MG/2ML IJ SOLN
INTRAMUSCULAR | Status: AC
  Filled 2024-03-11: qty 2

## 2024-03-11 MED ORDER — HYDROMORPHONE HCL 1 MG/ML IJ SOLN
INTRAMUSCULAR | Status: DC | PRN
  Administered 2024-03-11: .5 mg via INTRAVENOUS

## 2024-03-11 MED ORDER — CEFAZOLIN SODIUM-DEXTROSE 2-3 GM-%(50ML) IV SOLR
INTRAVENOUS | Status: DC | PRN
  Administered 2024-03-11: 2 g via INTRAVENOUS

## 2024-03-11 MED ORDER — OXYCODONE HCL 5 MG/5ML PO SOLN: 5.0000 mg | Freq: Once | ORAL | Status: AC | PRN

## 2024-03-11 MED ORDER — DROPERIDOL 2.5 MG/ML IJ SOLN
INTRAMUSCULAR | Status: DC
Start: 2024-03-11 — End: 2024-03-11
  Filled 2024-03-11: qty 2

## 2024-03-11 MED ORDER — KETOROLAC TROMETHAMINE 30 MG/ML IJ SOLN
INTRAMUSCULAR | Status: AC
  Filled 2024-03-11: qty 1

## 2024-03-11 MED ORDER — MIDAZOLAM HCL 2 MG/2ML IJ SOLN
2.0000 mg | Freq: Once | INTRAMUSCULAR | Status: AC
  Administered 2024-03-11: 2 mg via INTRAVENOUS

## 2024-03-11 MED ORDER — ONDANSETRON HCL 4 MG/2ML IJ SOLN
INTRAMUSCULAR | Status: DC | PRN
  Administered 2024-03-11: 4 mg via INTRAVENOUS

## 2024-03-11 MED ORDER — TRAMADOL HCL 50 MG PO TABS: 50.0000 mg | ORAL_TABLET | Freq: Four times a day (QID) | ORAL | 0 refills | Status: AC | PRN

## 2024-03-11 MED ORDER — HYDROMORPHONE HCL 1 MG/ML IJ SOLN
INTRAMUSCULAR | Status: AC
  Filled 2024-03-11: qty 0.5

## 2024-03-11 MED ORDER — SODIUM CHLORIDE 0.9 % IR SOLN
Status: DC | PRN
  Administered 2024-03-11: 1000 mL

## 2024-03-11 MED ORDER — ROCURONIUM BROMIDE 10 MG/ML (PF) SYRINGE
PREFILLED_SYRINGE | INTRAVENOUS | Status: DC | PRN
  Administered 2024-03-11: 60 mg via INTRAVENOUS

## 2024-03-11 MED ORDER — METHOCARBAMOL 1000 MG/10ML IJ SOLN
500.0000 mg | Freq: Once | INTRAMUSCULAR | Status: AC
Start: 2024-03-11 — End: 2024-03-11
  Administered 2024-03-11: 500 mg via INTRAVENOUS

## 2024-03-11 MED ORDER — HYDROMORPHONE HCL 1 MG/ML IJ SOLN: 0.2500 mg | INTRAMUSCULAR | Status: DC | PRN

## 2024-03-11 MED ORDER — SUGAMMADEX SODIUM 200 MG/2ML IV SOLN
INTRAVENOUS | Status: DC | PRN
  Administered 2024-03-11: 400 mg via INTRAVENOUS

## 2024-03-11 MED ORDER — FENTANYL CITRATE (PF) 250 MCG/5ML IJ SOLN
INTRAMUSCULAR | Status: DC | PRN
  Administered 2024-03-11 (×3): 50 ug via INTRAVENOUS
  Administered 2024-03-11: 100 ug via INTRAVENOUS

## 2024-03-11 MED ORDER — LIDOCAINE 2% (20 MG/ML) 5 ML SYRINGE
INTRAMUSCULAR | Status: DC | PRN
  Administered 2024-03-11: 80 mg via INTRAVENOUS

## 2024-03-11 MED ORDER — LACTATED RINGERS IV SOLN: INTRAVENOUS | Status: DC

## 2024-03-11 MED ORDER — CHLORHEXIDINE GLUCONATE 0.12 % MT SOLN
15.0000 mL | Freq: Once | OROMUCOSAL | Status: AC
  Administered 2024-03-11: 15 mL via OROMUCOSAL
  Filled 2024-03-11: qty 15

## 2024-03-11 MED ORDER — EPHEDRINE SULFATE-NACL 50-0.9 MG/10ML-% IV SOSY
PREFILLED_SYRINGE | INTRAVENOUS | Status: DC | PRN
  Administered 2024-03-11: 100 mg via INTRAVENOUS

## 2024-03-11 MED ORDER — SODIUM CHLORIDE 0.9 % IV SOLN: 12.5000 mg | INTRAVENOUS | Status: DC | PRN

## 2024-03-11 MED ORDER — FENTANYL CITRATE (PF) 250 MCG/5ML IJ SOLN
INTRAMUSCULAR | Status: AC
  Filled 2024-03-11: qty 5

## 2024-03-11 MED ORDER — OXYCODONE HCL 5 MG PO TABS
ORAL_TABLET | ORAL | Status: AC
  Filled 2024-03-11: qty 1

## 2024-03-11 MED ORDER — 0.9 % SODIUM CHLORIDE (POUR BTL) OPTIME
TOPICAL | Status: DC | PRN
  Administered 2024-03-11: 1000 mL

## 2024-03-11 MED ORDER — METHOCARBAMOL 1000 MG/10ML IJ SOLN
INTRAMUSCULAR | Status: AC
  Filled 2024-03-11: qty 10

## 2024-03-11 MED ORDER — KETOROLAC TROMETHAMINE 30 MG/ML IJ SOLN
30.0000 mg | Freq: Once | INTRAMUSCULAR | Status: AC
  Administered 2024-03-11: 30 mg via INTRAVENOUS

## 2024-03-11 MED ORDER — ACETAMINOPHEN 10 MG/ML IV SOLN
1000.0000 mg | Freq: Four times a day (QID) | INTRAVENOUS | Status: DC
  Administered 2024-03-11: 1000 mg via INTRAVENOUS

## 2024-03-11 MED ORDER — MEPERIDINE HCL 25 MG/ML IJ SOLN: 6.2500 mg | INTRAMUSCULAR | Status: DC | PRN

## 2024-03-11 SURGICAL SUPPLY — 34 items
BAG COUNTER SPONGE SURGICOUNT (BAG) ×1 IMPLANT
CANISTER SUCT 3000ML PPV (MISCELLANEOUS) ×1 IMPLANT
CHLORAPREP W/TINT 26 (MISCELLANEOUS) ×1 IMPLANT
CLIP LIGATING HEMO O LOK GREEN (MISCELLANEOUS) ×1 IMPLANT
COVER SURGICAL LIGHT HANDLE (MISCELLANEOUS) ×1 IMPLANT
COVER TRANSDUCER ULTRASND (DRAPES) ×1 IMPLANT
DERMABOND ADVANCED .7 DNX12 (GAUZE/BANDAGES/DRESSINGS) ×1 IMPLANT
ELECTRODE REM PT RTRN 9FT ADLT (ELECTROSURGICAL) ×1 IMPLANT
GLOVE BIO SURGEON STRL SZ7.5 (GLOVE) ×2 IMPLANT
GOWN STRL REUS W/ TWL LRG LVL3 (GOWN DISPOSABLE) ×2 IMPLANT
GOWN STRL REUS W/ TWL XL LVL3 (GOWN DISPOSABLE) ×1 IMPLANT
GRASPER SUT TROCAR 14GX15 (MISCELLANEOUS) ×1 IMPLANT
IRRIGATION SUCT STRKRFLW 2 WTP (MISCELLANEOUS) ×1 IMPLANT
KIT BASIN OR (CUSTOM PROCEDURE TRAY) ×1 IMPLANT
KIT IMAGING PINPOINTPAQ (MISCELLANEOUS) IMPLANT
KIT TURNOVER KIT B (KITS) ×1 IMPLANT
NDL INSUFFLATION 14GA 120MM (NEEDLE) ×1 IMPLANT
NEEDLE INSUFFLATION 14GA 120MM (NEEDLE) ×1 IMPLANT
NS IRRIG 1000ML POUR BTL (IV SOLUTION) ×1 IMPLANT
PAD ARMBOARD POSITIONER FOAM (MISCELLANEOUS) ×1 IMPLANT
POUCH LAPAROSCOPIC INSTRUMENT (MISCELLANEOUS) ×1 IMPLANT
POUCH RETRIEVAL ECOSAC 10 (ENDOMECHANICALS) IMPLANT
SCISSORS LAP 5X35 DISP (ENDOMECHANICALS) ×1 IMPLANT
SET TUBE SMOKE EVAC HIGH FLOW (TUBING) ×1 IMPLANT
SLEEVE Z-THREAD 5X100MM (TROCAR) ×1 IMPLANT
SPECIMEN JAR SMALL (MISCELLANEOUS) ×1 IMPLANT
SUT MNCRL AB 4-0 PS2 18 (SUTURE) ×1 IMPLANT
TOWEL GREEN STERILE (TOWEL DISPOSABLE) ×1 IMPLANT
TOWEL GREEN STERILE FF (TOWEL DISPOSABLE) ×1 IMPLANT
TRAY LAPAROSCOPIC MC (CUSTOM PROCEDURE TRAY) ×1 IMPLANT
TROCAR 11X100 Z THREAD (TROCAR) ×1 IMPLANT
TROCAR Z-THREAD OPTICAL 5X100M (TROCAR) ×1 IMPLANT
WARMER LAPAROSCOPE (MISCELLANEOUS) ×1 IMPLANT
WATER STERILE IRR 1000ML POUR (IV SOLUTION) ×1 IMPLANT

## 2024-03-11 NOTE — Anesthesia Procedure Notes (Signed)
 Procedure Name: Intubation Date/Time: 03/11/2024 3:35 PM  Performed by: Viki Graver, CRNAPre-anesthesia Checklist: Patient identified, Emergency Drugs available, Suction available and Patient being monitored Patient Re-evaluated:Patient Re-evaluated prior to induction Oxygen Delivery Method: Circle System Utilized Preoxygenation: Pre-oxygenation with 100% oxygen Induction Type: IV induction Ventilation: Mask ventilation without difficulty Laryngoscope Size: Miller and 2 Grade View: Grade I Tube type: Oral Tube size: 7.0 mm Number of attempts: 1 Airway Equipment and Method: Stylet and Bite block Placement Confirmation: ETT inserted through vocal cords under direct vision, positive ETCO2 and breath sounds checked- equal and bilateral Secured at: 22 cm Tube secured with: Tape Dental Injury: Teeth and Oropharynx as per pre-operative assessment

## 2024-03-11 NOTE — Transfer of Care (Signed)
 Immediate Anesthesia Transfer of Care Note  Patient: Jeanne Mcneil  Procedure(s) Performed: LAPAROSCOPIC CHOLECYSTECTOMY (Abdomen)  Patient Location: PACU  Anesthesia Type:General  Level of Consciousness: awake, alert , and oriented  Airway & Oxygen Therapy: Patient Spontanous Breathing and Patient connected to face mask oxygen  Post-op Assessment: Report given to RN and Post -op Vital signs reviewed and stable  Post vital signs: Reviewed and stable  Last Vitals:  Vitals Value Taken Time  BP    Temp    Pulse 60 03/11/24 1620  Resp 12 03/11/24 1620  SpO2 100 % 03/11/24 1620  Vitals shown include unfiled device data.  Last Pain:  Vitals:   03/11/24 1323  TempSrc:   PainSc: 0-No pain         Complications: No notable events documented.

## 2024-03-11 NOTE — Anesthesia Postprocedure Evaluation (Signed)
 Anesthesia Post Note  Patient: Jeanne Mcneil  Procedure(s) Performed: LAPAROSCOPIC CHOLECYSTECTOMY (Abdomen)     Anesthesia Type: General Anesthetic complications: no   No notable events documented.  Last Vitals:  Vitals:   03/11/24 1700 03/11/24 1715  BP: (!) 181/128 (!) 188/95  Pulse: 71 86  Resp: 13 (!) 25  Temp:  36.4 C  SpO2: 93% 97%    Last Pain:  Vitals:   03/11/24 1715  TempSrc:   PainSc: 4                  Lethaniel Rave

## 2024-03-11 NOTE — Discharge Instructions (Signed)
 [facility] ______[facility] LAPAROSCOPIC SURGERY: POST OP INSTRUCTIONS Always review your discharge instruction sheet given to you by the facility where your surgery was performed. IF YOU HAVE DISABILITY OR FAMILY LEAVE FORMS, YOU MUST BRING THEM TO THE OFFICE FOR PROCESSING.   DO NOT GIVE THEM TO YOUR DOCTOR.  A prescription for pain medication may be given to you upon discharge.  Take your pain medication as prescribed, if needed.  If narcotic pain medicine is not needed, then you may take acetaminophen (Tylenol) or ibuprofen (Advil) as needed. Take your usually prescribed medications unless otherwise directed. If you need a refill on your pain medication, please contact your pharmacy.  They will contact our office to request authorization. Prescriptions will not be filled after 5pm or on week-ends. You should follow a light diet the first few days after arrival home, such as soup and crackers, etc.  Be sure to include lots of fluids daily. Most patients will experience some swelling and bruising in the area of the incisions.  Ice packs will help.  Swelling and bruising can take several days to resolve.  It is common to experience some constipation if taking pain medication after surgery.  Increasing fluid intake and taking a stool softener (such as Colace) will usually help or prevent this problem from occurring.  A mild laxative (Milk of Magnesia or Miralax) should be taken according to package instructions if there are no bowel movements after 48 hours. Unless discharge instructions indicate otherwise, you may remove your bandages 24-48 hours after surgery, and you may shower at that time.  You may have steri-strips (small skin tapes) in place directly over the incision.  These strips should be left on the skin for 7-10 days.  If your surgeon used skin glue on the incision, you may shower in 24 hours.  The glue will flake off over the next 2-3 weeks.  Any sutures or staples will be  removed at the office during your follow-up visit. ACTIVITIES:  You may resume regular (light) daily activities beginning the next day--such as daily self-care, walking, climbing stairs--gradually increasing activities as tolerated.  You may have sexual intercourse when it is comfortable.  Refrain from any heavy lifting or straining until approved by your doctor. You may drive when you are no longer taking prescription pain medication, you can comfortably wear a seatbelt, and you can safely maneuver your car and apply brakes. RETURN TO WORK:  __________________________________________________________ Jeanne Mcneil should see your doctor in the office for a follow-up appointment approximately 2-3 weeks after your surgery.  Make sure that you call for this appointment within a day or two after you arrive home to insure a convenient appointment time. OTHER INSTRUCTIONS: __________________________________________________________________________________________________________________________ __________________________________________________________________________________________________________________________ WHEN TO CALL YOUR DOCTOR: Fever over 101.0 Inability to urinate Continued bleeding from incision. Increased pain, redness, or drainage from the incision. Increasing abdominal pain  The clinic staff is available to answer your questions during regular business hours.  Please don't hesitate to call and ask to speak to one of the nurses for clinical concerns.  If you have a medical emergency, go to the nearest emergency room or call 911.  A surgeon from [facility] is always on call at the hospital. [address], [address], [place], [place]  [address] ? [address] [address], [place], [place]   [address] [phone] ? [phone] ? FAX [phone] Web site: [email or web address]

## 2024-03-11 NOTE — Op Note (Signed)
 03/11/2024  4:08 PM  PATIENT:  Jeanne Mcneil  38 y.o. female  PRE-OPERATIVE DIAGNOSIS:  BILIARY DYSKINESIA  POST-OPERATIVE DIAGNOSIS:  BILIARY DYSKINESIA  PROCEDURE:  Procedure(s): LAPAROSCOPIC CHOLECYSTECTOMY (N/A)  SURGEON:  Surgeons and Role:    Shela Derby, MD - Primary  ASSISTANTS: none   ANESTHESIA:   local and general  EBL:  10 mL   BLOOD ADMINISTERED:none  DRAINS: none   LOCAL MEDICATIONS USED:  BUPIVICAINE   SPECIMEN:  Source of Specimen:  gallbaldder  DISPOSITION OF SPECIMEN:  PATHOLOGY  COUNTS:  YES  TOURNIQUET:  * No tourniquets in log *  DICTATION: .Dragon Dictation  EBL: <5cc   Complications: none   Counts: reported as correct x 2   Findings:chronically inflamed gallbladder  Indications for procedure: Pt is a 38F with RUQ pain and seen to have biliary dyskenesia  Details of the procedure: The patient was taken to the operating and placed in the supine position with bilateral SCDs in place. A time out was called and all facts were verified. A pneumoperitoneum was obtained via A Veress needle technique to a pressure of 14mm of mercury. A 5mm trochar was then placed in the right upper quadrant under visualization, and there were no injuries to any abdominal organs. A 11 mm port was then placed in the umbilical region after infiltrating with local anesthesia under direct visualization. A second epigastric port was placed under direct visualization.   The gallbladder was identified and retracted, the peritoneum was then sharply dissected from the gallbladder and this dissection was carried down to Calot's triangle. The cystic duct was identified and dissected circumferentially and seen going into the gallbladder 360.  The cystic artery was dissected away from the surrounding tissues.   The critical angle was obtained.  2 clips were placed proximally one distally and the cystic duct transected. The cystic artery was identified and 2 clips placed  proximally and one distally and transected. We then proceeded to remove the gallbladder off the hepatic fossa with Bovie cautery. A retrieval bag was then placed in the abdomen and gallbladder placed in the bag. The hepatic fossa was then reexamined and hemostasis was achieved with Bovie cautery and was excellent at this portion of the case. The subhepatic fossa and perihepatic fossa was then irrigated until the effluent was clear. The specimen bag and specimen were removed from the abdominal cavity.  The 11 mm trocar fascia was reapproximated with the Endo Close #1 Vicryl x2. The pneumoperitoneum was evacuated and all trochars removed under direct visulalization. The skin was then closed with 4-0 Monocryl and the skin dressed with Dermabond. The patient was awaken from general anesthesia and taken to the recovery room in stable condition.    PLAN OF CARE: Discharge to home after PACU  PATIENT DISPOSITION:  PACU - hemodynamically stable.   Delay start of Pharmacological VTE agent (>24hrs) due to surgical blood loss or risk of bleeding: not applicable

## 2024-03-11 NOTE — Interval H&P Note (Signed)
 History and Physical Interval Note:  03/11/2024 3:07 PM  Jeanne Mcneil  has presented today for surgery, with the diagnosis of BILIARY DYSKINESIA.  The various methods of treatment have been discussed with the patient and family. After consideration of risks, benefits and other options for treatment, the patient has consented to  Procedure(s): LAPAROSCOPIC CHOLECYSTECTOMY (N/A) as a surgical intervention.  The patient's history has been reviewed, patient examined, no change in status, stable for surgery.  I have reviewed the patient's chart and labs.  Questions were answered to the patient's satisfaction.     Labrandon Knoch

## 2024-03-11 NOTE — Anesthesia Preprocedure Evaluation (Signed)
 Anesthesia Evaluation  Patient identified by MRN, date of birth, ID band Patient awake    Reviewed: Allergy & Precautions, NPO status , Patient's Chart, lab work & pertinent test results  Airway Mallampati: II  TM Distance: >3 FB Neck ROM: Full    Dental  (+) Teeth Intact, Dental Advisory Given   Pulmonary Current Smoker and Patient abstained from smoking.   Pulmonary exam normal breath sounds clear to auscultation       Cardiovascular Exercise Tolerance: Good negative cardio ROS Normal cardiovascular exam Rhythm:Regular Rate:Normal     Neuro/Psych Seizures -,  PSYCHIATRIC DISORDERS Anxiety Depression    glaucoma    GI/Hepatic Neg liver ROS, PUD,GERD  ,,Nausea vomiting, epigastric pain. UC   Endo/Other  negative endocrine ROS  Obesity   Renal/GU Renal disease     Musculoskeletal negative musculoskeletal ROS (+)    Abdominal   Peds  Hematology negative hematology ROS (+)   Anesthesia Other Findings Day of surgery medications reviewed with the patient.  Reproductive/Obstetrics negative OB ROS                             Anesthesia Physical Anesthesia Plan  ASA: 2  Anesthesia Plan: General   Post-op Pain Management:    Induction: Intravenous  PONV Risk Score and Plan: 2 and Ondansetron , Midazolam  and Treatment may vary due to age or medical condition  Airway Management Planned: Oral ETT  Additional Equipment: None  Intra-op Plan:   Post-operative Plan: Extubation in OR  Informed Consent: I have reviewed the patients History and Physical, chart, labs and discussed the procedure including the risks, benefits and alternatives for the proposed anesthesia with the patient or authorized representative who has indicated his/her understanding and acceptance.     Dental advisory given  Plan Discussed with: CRNA  Anesthesia Plan Comments:         Anesthesia Quick  Evaluation

## 2024-03-12 ENCOUNTER — Encounter (HOSPITAL_COMMUNITY): Payer: Self-pay | Admitting: General Surgery

## 2024-03-13 LAB — SURGICAL PATHOLOGY

## 2024-04-08 NOTE — H&P (Signed)
 HPI  Jeanne Mcneil is a 38 y.o. female who presents for New Consultation History of Present Illness The patient, with a history of a cauterized stomach and kidney stones, presents with symptoms suggestive of gallbladder issues. She reports experiencing nausea, sweating, and severe stabbing pain in the center of her abdomen after eating. These symptoms have been ongoing for a month and have led to a recent hospitalization. The patient also experiences a burning sensation on the side of her abdomen.  In addition to the gallbladder issues, the patient has developed MRSA since her recent hospitalization. She has multiple skin lesions, which she has been treating with mupirocin  cream and doxycycline . The patient reports that the lesions are painful but appear to be healing.  Review of Systems  Constitutional: Negative for chills and fever.  HENT: Negative for ear pain and sore throat.  Eyes: Negative for pain and visual disturbance.  Respiratory: Negative for cough and shortness of breath.  Cardiovascular: Negative for chest pain and palpitations.  Gastrointestinal: Positive for abdominal pain, nausea and vomiting.  Genitourinary: Negative for dysuria and hematuria.  Musculoskeletal: Negative for arthralgias and back pain.  Skin: Negative for color change and rash.  Neurological: Negative for seizures and syncope.  All other systems reviewed and are negative.  There is no problem list on file for this patient.  Outpatient Medications Prior to Visit  Medication Sig Dispense Refill  metoclopramide  (REGLAN ) 5 MG tablet Take 5 mg by mouth  mupirocin  (BACTROBAN ) 2 % ointment  [START ON 02/01/2024] ondansetron  (ZOFRAN ) 4 MG tablet Take 4 mg by mouth every 8 (eight) hours as needed   No facility-administered medications prior to visit.     Objective   Vitals:  01/24/24 0836  BP: 112/82  Pulse: 94  Weight: 77.2 kg (170 lb 3.2 oz)  Height: 157.5 cm (5\' 2" )   Body mass index is 31.13  kg/m.  Home Vitals:   Physical Exam Physical Exam SKIN: Skin lesions with scabs, red and shrinking, no pus, on right leg and breast.  PE:  Constitutional: No acute distress, conversant, appears states age. Eyes: Anicteric sclerae, moist conjunctiva, no lid lag Lungs: Clear to auscultation bilaterally, normal respiratory effort CV: regular rate and rhythm, no murmurs, no peripheral edema, pedal pulses 2+ GI: Soft, no masses or hepatosplenomegaly, non-tender to palpation Skin: No rashes, palpation reveals normal turgor Psychiatric: appropriate judgment and insight, oriented to person, place, and time  Results RADIOLOGY Abdominal ultrasound: Sludge in the gallbladder (01/10/2024) Abdominal CT scan: Sludge in the gallbladder (01/10/2024)  DIAGNOSTIC EGD: Normal (01/10/2024) HIDA scan: Gallbladder ejection fraction 0% (01/10/2024)    Assessment/Plan:   Assessment & Plan Cholecystopathy with gallbladder dysfunction  The gallbladder is non-functioning with a 0% ejection fraction on the HIDA scan, and sludge is present. Symptoms necessitate surgical intervention. Schedule a laparoscopic cholecystectomy.  Advise a bland diet until surgery to minimize symptoms.  All risks and benefits were discussed with the patient to generally include: infection, bleeding, possible need for post op ERCP, damage to the bile ducts, and bile leak. Alternatives were offered and described. All questions were answered and the patient voiced understanding of the procedure and wishes to proceed at this point with a laparoscopic cholecystectomy  Methicillin-resistant Staphylococcus aureus (MRSA) skin infection  MRSA skin infections are present post-hospitalization with healing lesions. Current treatment is effective. Continue doxycycline  and mupirocin  2% cream. Advise using dial soap for hygiene and educate on avoiding scratching to prevent spread. Allergy to Keflex   Allergy causes hives.  Diagnoses and  all orders for this visit:  Biliary dyskinesia    Z.

## 2024-04-15 ENCOUNTER — Ambulatory Visit (INDEPENDENT_AMBULATORY_CARE_PROVIDER_SITE_OTHER): Admitting: Gastroenterology

## 2024-05-23 ENCOUNTER — Ambulatory Visit
Admission: EM | Admit: 2024-05-23 | Discharge: 2024-05-23 | Disposition: A | Discharge status: Home / Self Care | Source: Ambulatory Visit | Attending: Nurse Practitioner | Admitting: Nurse Practitioner

## 2024-05-23 ENCOUNTER — Encounter: Payer: Self-pay | Admitting: Emergency Medicine

## 2024-05-23 ENCOUNTER — Other Ambulatory Visit: Payer: Self-pay

## 2024-05-23 DIAGNOSIS — N760 Acute vaginitis: Secondary | ICD-10-CM

## 2024-05-23 DIAGNOSIS — Z113 Encounter for screening for infections with a predominantly sexual mode of transmission: Secondary | ICD-10-CM | POA: Diagnosis not present

## 2024-05-23 LAB — POCT URINALYSIS DIP (MANUAL ENTRY)
Bilirubin, UA: NEGATIVE
Blood, UA: NEGATIVE
Glucose, UA: NEGATIVE mg/dL
Ketones, POC UA: NEGATIVE mg/dL
Leukocytes, UA: NEGATIVE
Nitrite, UA: NEGATIVE
Protein Ur, POC: 30 mg/dL — AB
Spec Grav, UA: 1.02 (ref 1.010–1.025)
Urobilinogen, UA: 0.2 U/dL
pH, UA: 7 (ref 5.0–8.0)

## 2024-05-23 MED ORDER — FLUCONAZOLE 150 MG PO TABS: ORAL_TABLET | ORAL | 0 refills | Status: AC

## 2024-05-23 NOTE — ED Provider Notes (Signed)
 [facility]    CSN: [id number] Arrival date & time: 05/23/24  1032      History   Chief Complaint Chief Complaint  Patient presents with   Abdominal Pain    HPI Jeanne Mcneil is a 38 y.o. female.   The history is provided by the patient.  Patient presents with a 1 week history of lower abdominal pain/cramping, vaginal itching, vaginal and vaginal irritation.  She is requesting STI testing today.  She denies vaginal odor.  States that she had unprotected sex approximately 2 weeks ago.  She has had 1 female partner in the past 90 days.  She further denies urinary frequency, urgency, hesitancy, flank pain, low back pain, fever, or chills.  Patient is also requesting testing for HIV and syphilis.  Past Medical History:  Diagnosis Date   Anxiety    Back pain    Constipation    Depression    Diverticulitis 10/31/2017   GERD (gastroesophageal reflux disease)    Glaucoma of both eyes    Hematuria    History of abnormal cervical Pap smear    History of chronic gastritis    History of kidney stones    Hydronephrosis, right    Renal calculi    bilateral per ct 0902-2017   Right ureteral stone    Trichimoniasis 09/05/2018   Ulcerative colitis (HCC)    Urgency of urination    Uterine fibroid    Uterine polyp    Vaginal Pap smear, abnormal     Patient Active Problem List   Diagnosis Date Noted   Grade I hemorrhoids 02/21/2024   History of colitis 02/18/2024   Diarrhea due to malabsorption 02/18/2024   Abnormal biliary HIDA scan 01/17/2024   Intractable nausea and vomiting 01/14/2024   Emesis, persistent 01/09/2024   Obesity (BMI 30-39.9) 01/09/2024   Menorrhagia with regular cycle    Dyspareunia, female    Uterus prolapse    Trichomoniasis 09/05/2018   Noncompliance by refusing service= multiple NO SHOW's 05/01/2018   Dehydration 01/01/2018   Lactic acidosis 01/01/2018   SIRS (systemic inflammatory response syndrome) (HCC) 01/01/2018   Hypercalcemia  01/01/2018   ARF (acute renal failure) (HCC) 12/31/2017   Gonorrhea 12/20/2017   AKI (acute kidney injury) (HCC) 06/13/2017   Hematuria 06/13/2017   Hypomagnesemia 04/29/2017   Cannabinoid hyperemesis syndrome 04/29/2017   Acute colitis 04/29/2017   Abdominal pain    Normochromic normocytic anemia    Nausea & vomiting 04/02/2017   Acute renal failure (HCC) 01/16/2017   Hyponatremia 01/16/2017   Leukocytosis 01/16/2017   Nausea and vomiting 01/15/2017   Chest pain 01/15/2017   Pyelonephritis 10/11/2016   Hypokalemia 10/11/2016   Hyperglycemia 10/11/2016   Elevated blood pressure reading 10/11/2016   Left nephrolithiasis 10/11/2016   Renal colic on left side 10/11/2016   Generalized anxiety disorder 10/11/2016   Nausea with vomiting 07/26/2016   Ureteral stone 07/04/2016   UTI (urinary tract infection) 07/04/2016   Hydronephrosis of right kidney 07/04/2016   GERD (gastroesophageal reflux disease) 07/04/2016   Ureteral calculi 07/04/2016   Abdominal pain, chronic, epigastric    Hematemesis 05/16/2016   Heme + stool 05/16/2016   Abdominal pain, epigastric 05/16/2016    Past Surgical History:  Procedure Laterality Date   CHOLECYSTECTOMY N/A 03/11/2024   Procedure: LAPAROSCOPIC CHOLECYSTECTOMY;  Surgeon: Rubin Calamity, MD;  Location[facility];  Service: General;  Laterality: N/A;   COLONOSCOPY N/A 02/21/2024   Procedure: COLONOSCOPY;  Surgeon: Cinderella Deatrice FALCON, MD;  Location: [facility];  Service: Endoscopy;  Laterality: N/A;  8:15am;asa 1-2   CYSTOSCOPY W/ URETERAL STENT PLACEMENT Right 07/04/2016   Procedure: CYSTOSCOPY WITH RIGHT RETROGRADE PYELOGRAM, RIGHT URETERAL STENT PLACEMENT;  Surgeon: Morene LELON Salines, MD;  Location: [facility];  Service: Urology;  Laterality: Right;   CYSTOSCOPY WITH RETROGRADE PYELOGRAM, URETEROSCOPY AND STENT PLACEMENT Left 11/28/2007   CYSTOSCOPY WITH RETROGRADE PYELOGRAM, URETEROSCOPY AND STENT PLACEMENT Right 08/02/2016   Procedure: CYSTOSCOPY  WITH RIGHT  RETROGRADE PYELOGRAM, URETEROSCOPY,  AND STENT EXCHANGE;  Surgeon: Morene LELON Salines, MD;  Location: [facility];  Service: Urology;  Laterality: Right;   ESOPHAGOGASTRODUODENOSCOPY N/A 05/17/2016   Dr. Harvey: LA Grade A esophagitis, mild gastritis s/p biopsy noting reactive gastritis, medium sized hiatal hernia, next EGD WITH PROPOFOL     ESOPHAGOGASTRODUODENOSCOPY (EGD) WITH PROPOFOL  N/A 01/16/2024   Procedure: ESOPHAGOGASTRODUODENOSCOPY (EGD) WITH PROPOFOL ;  Surgeon: Legrand Victory LITTIE DOUGLAS, MD;  Location: [facility] ENDOSCOPY;  Service: Gastroenterology;  Laterality: N/A;   INDUCED ABORTION  2009   VAGINAL HYSTERECTOMY Bilateral 08/10/2021   Procedure: HYSTERECTOMY VAGINAL;  Surgeon: Jayne Vonn DEL, MD;  Location: [facility];  Service: Gynecology;  Laterality: Bilateral;    OB History     Gravida  3   Para  2   Term  2   Preterm      AB  1   Living  2      SAB  1   IAB      Ectopic      Multiple      Live Births  2            Home Medications    Prior to Admission medications   Medication Sig Start Date End Date Taking? Authorizing Provider  fluconazole  (DIFLUCAN ) 150 MG tablet Take 1 tablet by mouth today.  May repeat every 3 days up to 2 additional doses. 05/23/24  Yes Leath-Warren, Etta PARAS, NP  acetaminophen  (TYLENOL ) 500 MG tablet Take 500-1,000 mg by mouth every 6 (six) hours as needed (pain.).    [provider]  baclofen  (LIORESAL ) 20 MG tablet Take 20 mg by mouth 3 (three) times daily as needed for muscle spasms. 01/10/24   [provider]  dicyclomine  (BENTYL ) 20 MG tablet Take 0.5 tablets (10 mg total) by mouth 3 (three) times daily before meals. 02/18/24   Ahmed, Deatrice FALCON, MD  linaclotide  (LINZESS ) 145 MCG CAPS capsule Take 1 capsule (145 mcg total) by mouth daily. 02/21/24   Ahmed, Muhammad F, MD  metoCLOPramide  (REGLAN ) 5 MG tablet Take 1 tablet (5 mg total) by mouth 3 (three) times daily before meals for 14 days. 01/17/24  02/18/24  Sebastian Toribio GAILS, MD  ondansetron  (ZOFRAN ) 4 MG tablet Take 1 tablet (4 mg total) by mouth every 8 (eight) hours as needed for nausea or vomiting. Patient taking differently: Take 8 mg by mouth in the morning and at bedtime. 02/01/24   Sebastian Toribio GAILS, MD  oxyCODONE  (ROXICODONE ) 5 MG immediate release tablet Take 1 tablet (5 mg total) by mouth every 4 (four) hours as needed for severe pain (pain score 7-10). 03/11/24   Rubin Calamity, MD  oxyCODONE  (ROXICODONE ) 5 MG immediate release tablet Take 1 tablet (5 mg total) by mouth every 4 (four) hours as needed for severe pain (pain score 7-10). 03/11/24   Rubin Calamity, MD  pantoprazole  (PROTONIX ) 40 MG tablet Take 1 tablet (40 mg total) by mouth daily. 02/18/24   Ahmed, Deatrice FALCON, MD  traMADol  (  ULTRAM ) 50 MG tablet Take 1 tablet (50 mg total) by mouth every 6 (six) hours as needed. 03/11/24 03/11/25  Rubin Calamity, MD    Family History Family History  Problem Relation Age of Onset   Colon cancer Paternal Uncle        Passed away 2015/06/22   Nephrolithiasis Father    Hypertension Father    Nephrolithiasis Sister    Nephrolithiasis Sister    Nephrolithiasis Sister    Hypertension Mother    Leukemia Mother    Colon cancer Maternal Grandfather    Club foot Son     Social History Social History   Tobacco Use   Smoking status: Every Day    Current packs/day: 1.00    Average packs/day: 1 pack/day for 20.0 years (20.0 ttl pk-yrs)    Types: Cigarettes   Smokeless tobacco: Never  Vaping Use   Vaping status: Never Used  Substance Use Topics   Alcohol use: No    Alcohol/week: 4.0 standard drinks of alcohol    Types: 4 Shots of liquor per week    Comment: states has not used in 2 years   Drug use: Not Currently    Types: Marijuana     Allergies   Nsaids, Tape, Keflex  [cephalexin ], Toradol  [ketorolac  tromethamine ], Codeine, and Vicodin [hydrocodone -acetaminophen ]   Review of Systems Review of Systems Per HPI  Physical  Exam Triage Vital Signs ED Triage Vitals  Encounter Vitals Group     BP 05/23/24 1050 126/87     Girls Systolic BP Percentile --      Girls Diastolic BP Percentile --      Boys Systolic BP Percentile --      Boys Diastolic BP Percentile --      Pulse Rate 05/23/24 1050 72     Resp 05/23/24 1050 20     Temp 05/23/24 1050 98.1 F (36.7 C)     Temp Source 05/23/24 1050 Oral     SpO2 05/23/24 1050 96 %     Weight --      Height --      Head Circumference --      Peak Flow --      Pain Score 05/23/24 1047 0     Pain Loc --      Pain Education --      Exclude from Growth Chart --    No data found.  Updated Vital Signs BP 126/87 (BP Location: Right Arm)   Pulse 72   Temp 98.1 F (36.7 C) (Oral)   Resp 20   LMP 06/30/2021   SpO2 96%   Visual Acuity Right Eye Distance:   Left Eye Distance:   Bilateral Distance:    Right Eye Near:   Left Eye Near:    Bilateral Near:     Physical Exam Vitals and nursing note reviewed.  Constitutional:      General: She is not in acute distress.    Appearance: She is well-developed.  HENT:     Head: Normocephalic.  Eyes:     Extraocular Movements: Extraocular movements intact.     Conjunctiva/sclera: Conjunctivae normal.     Pupils: Pupils are equal, round, and reactive to light.  Cardiovascular:     Rate and Rhythm: Normal rate and regular rhythm.     Pulses: Normal pulses.     Heart sounds: Normal heart sounds.  Pulmonary:     Effort: Pulmonary effort is normal. No respiratory distress.     Breath sounds: Normal  breath sounds. No stridor. No wheezing, rhonchi or rales.  Abdominal:     General: Bowel sounds are normal.     Palpations: Abdomen is soft.     Tenderness: There is no abdominal tenderness.  Musculoskeletal:     Cervical back: Normal range of motion.  Lymphadenopathy:     Cervical: No cervical adenopathy.  Skin:    General: Skin is warm and dry.  Neurological:     General: No focal deficit present.     Mental  Status: She is alert and oriented to person, place, and time.  Psychiatric:        Mood and Affect: Mood normal.        Behavior: Behavior normal.      UC Treatments / Results  Labs (all labs ordered are listed, but only abnormal results are displayed) Labs Reviewed  POCT URINALYSIS DIP (MANUAL ENTRY) - Abnormal; Notable for the following components:      Result Value   Protein Ur, POC =30 (*)    All other components within normal limits  RPR  HIV ANTIBODY (ROUTINE TESTING W REFLEX)  CERVICOVAGINAL ANCILLARY ONLY    EKG   Radiology No results found.  Procedures Procedures (including critical care time)  Medications Ordered in UC Medications - No data to display  Initial Impression / Assessment and Plan / UC Course  I have reviewed the triage vital signs and the nursing notes.  Pertinent labs & imaging results that were available during my care of the patient were reviewed by me and considered in my medical decision making (see chart for details).  Cytology swab, HIV, RPR and urine culture are pending.  In the interim, we will treat for vaginosis with fluconazole  150 mg.  Supportive care recommendations were provided and discussed with the patient to include increasing condom use, refraining from sexual intercourse until test results are received, and notifying all partners if test results are positive.  Discussed indications with patient regarding follow-up.  Patient was in agreement with this plan of care and verbalizes understanding.  All questions were answered.  Patient stable for discharge.   Final Clinical Impressions(s) / UC Diagnoses   Final diagnoses:  Acute vaginitis  Screening for STD (sexually transmitted disease)     Discharge Instructions      Lab test results are pending.  You will be contacted if the pending test results are abnormal.  You also access to your results via MyChart. Take medication as prescribed. You may take Tylenol  as needed for  pain, fever, or general discomfort. Refrain from sexual intercourse until test results are received.  If test results are positive, please notify all partners.  If treatment is required, you will need to refrain from sexual intercourse for an additional 7 days after completing treatment. Follow-up as needed.     ED Prescriptions     Medication Sig Dispense Auth. Provider   fluconazole  (DIFLUCAN ) 150 MG tablet Take 1 tablet by mouth today.  May repeat every 3 days up to 2 additional doses. 3 tablet Leath-Warren, Etta PARAS, NP      PDMP not reviewed this encounter.   Gilmer Etta PARAS, NP 05/23/24 [id number]

## 2024-05-23 NOTE — ED Triage Notes (Addendum)
 Pt lower abdominal pain/cramps, vaginal itching, and vaginal burning x1 week. Pt concerned for possible STD or yeast infection.   Pt reports last unprotected encounter x2 weeks ago.   Vaginal swab obtained post triage.

## 2024-05-23 NOTE — Discharge Instructions (Addendum)
 Lab test results are pending.  You will be contacted if the pending test results are abnormal.  You also access to your results via MyChart. Take medication as prescribed. You may take Tylenol  as needed for pain, fever, or general discomfort. Refrain from sexual intercourse until test results are received.  If test results are positive, please notify all partners.  If treatment is required, you will need to refrain from sexual intercourse for an additional 7 days after completing treatment. Follow-up as needed.

## 2024-05-24 LAB — URINE CULTURE: Culture: NO GROWTH

## 2024-05-25 LAB — RPR: RPR Ser Ql: NONREACTIVE

## 2024-05-25 LAB — HIV ANTIBODY (ROUTINE TESTING W REFLEX): HIV Screen 4th Generation wRfx: NONREACTIVE

## 2024-05-26 ENCOUNTER — Ambulatory Visit (HOSPITAL_COMMUNITY): Payer: Self-pay

## 2024-05-26 ENCOUNTER — Ambulatory Visit: Payer: Self-pay

## 2024-05-26 LAB — CERVICOVAGINAL ANCILLARY ONLY
Bacterial Vaginitis (gardnerella): POSITIVE — AB
Candida Glabrata: NEGATIVE
Candida Vaginitis: NEGATIVE
Chlamydia: NEGATIVE
Comment: NEGATIVE
Comment: NEGATIVE
Comment: NEGATIVE
Comment: NEGATIVE
Comment: NEGATIVE
Comment: NORMAL
Neisseria Gonorrhea: NEGATIVE
Trichomonas: NEGATIVE

## 2024-05-26 NOTE — Telephone Encounter (Signed)
 FYI Only or Action Required?: FYI only for provider.  Patient was last seen in primary care on na. Called Nurse Triage reporting Results. Symptoms began today. Interventions attempted: Nothing. Symptoms are: stable.  Triage Disposition: Lab Test Only Within 24 Hours  Patient/caregiver understands and will follow disposition?: Yes     Copied from CRM [id number]. Topic: Clinical - Lab/Test Results >> May 26, 2024 11:45 AM Carrielelia G wrote: Reason for CRM: Patient Rech would like to speak with a nurse to break down lab results. She thinks she has syphilis Reason for Disposition  Nursing judgment or information in reference  Answer Assessment - Initial Assessment Questions 1. REASON FOR CALL: What is your main concern right now?     Wanted lab results.  Protocols used: No Guideline Available-[facility]

## 2024-05-27 MED ORDER — METRONIDAZOLE 500 MG PO TABS: 500.0000 mg | ORAL_TABLET | Freq: Two times a day (BID) | ORAL | 0 refills | Status: AC

## 2024-11-19 ENCOUNTER — Emergency Department (HOSPITAL_COMMUNITY)
Admission: EM | Admit: 2024-11-19 | Discharge: 2024-11-19 | Disposition: A | Discharge status: Home / Self Care | Source: Home / Self Care | Attending: Emergency Medicine | Admitting: Emergency Medicine

## 2024-11-19 ENCOUNTER — Emergency Department (HOSPITAL_COMMUNITY)

## 2024-11-19 ENCOUNTER — Encounter (HOSPITAL_COMMUNITY): Payer: Self-pay

## 2024-11-19 ENCOUNTER — Other Ambulatory Visit: Payer: Self-pay

## 2024-11-19 DIAGNOSIS — Y92410 Unspecified street and highway as the place of occurrence of the external cause: Secondary | ICD-10-CM | POA: Diagnosis not present

## 2024-11-19 DIAGNOSIS — M549 Dorsalgia, unspecified: Secondary | ICD-10-CM | POA: Insufficient documentation

## 2024-11-19 DIAGNOSIS — S199XXA Unspecified injury of neck, initial encounter: Secondary | ICD-10-CM | POA: Diagnosis present

## 2024-11-19 DIAGNOSIS — S299XXA Unspecified injury of thorax, initial encounter: Secondary | ICD-10-CM | POA: Insufficient documentation

## 2024-11-19 DIAGNOSIS — S8002XA Contusion of left knee, initial encounter: Secondary | ICD-10-CM | POA: Diagnosis not present

## 2024-11-19 DIAGNOSIS — S0990XA Unspecified injury of head, initial encounter: Secondary | ICD-10-CM | POA: Diagnosis not present

## 2024-11-19 DIAGNOSIS — S161XXA Strain of muscle, fascia and tendon at neck level, initial encounter: Secondary | ICD-10-CM | POA: Insufficient documentation

## 2024-11-19 DIAGNOSIS — S5012XA Contusion of left forearm, initial encounter: Secondary | ICD-10-CM | POA: Insufficient documentation

## 2024-11-19 DIAGNOSIS — S3991XA Unspecified injury of abdomen, initial encounter: Secondary | ICD-10-CM | POA: Insufficient documentation

## 2024-11-19 LAB — CBC WITH DIFFERENTIAL/PLATELET
Abs Immature Granulocytes: 0.04 K/uL (ref 0.00–0.07)
Basophils Absolute: 0.1 K/uL (ref 0.0–0.1)
Basophils Relative: 1 %
Eosinophils Absolute: 0.1 K/uL (ref 0.0–0.5)
Eosinophils Relative: 1 %
HCT: 41.8 % (ref 36.0–46.0)
Hemoglobin: 14.1 g/dL (ref 12.0–15.0)
Immature Granulocytes: 0 %
Lymphocytes Relative: 31 %
Lymphs Abs: 3 K/uL (ref 0.7–4.0)
MCH: 31.6 pg (ref 26.0–34.0)
MCHC: 33.7 g/dL (ref 30.0–36.0)
MCV: 93.7 fL (ref 80.0–100.0)
Monocytes Absolute: 0.8 K/uL (ref 0.1–1.0)
Monocytes Relative: 9 %
Neutro Abs: 5.7 K/uL (ref 1.7–7.7)
Neutrophils Relative %: 58 %
Platelets: 319 K/uL (ref 150–400)
RBC: 4.46 MIL/uL (ref 3.87–5.11)
RDW: 12.8 % (ref 11.5–15.5)
WBC: 9.8 K/uL (ref 4.0–10.5)
nRBC: 0 % (ref 0.0–0.2)

## 2024-11-19 LAB — COMPREHENSIVE METABOLIC PANEL WITH GFR
ALT: 15 U/L (ref 0–44)
AST: 20 U/L (ref 15–41)
Albumin: 4.2 g/dL (ref 3.5–5.0)
Alkaline Phosphatase: 78 U/L (ref 38–126)
Anion gap: 14 (ref 5–15)
BUN: 10 mg/dL (ref 6–20)
CO2: 21 mmol/L — ABNORMAL LOW (ref 22–32)
Calcium: 8.8 mg/dL — ABNORMAL LOW (ref 8.9–10.3)
Chloride: 103 mmol/L (ref 98–111)
Creatinine, Ser: 0.7 mg/dL (ref 0.44–1.00)
GFR, Estimated: >60 mL/min
Glucose, Bld: 119 mg/dL — ABNORMAL HIGH (ref 70–99)
Potassium: 3.3 mmol/L — ABNORMAL LOW (ref 3.5–5.1)
Sodium: 138 mmol/L (ref 135–145)
Total Bilirubin: 0.4 mg/dL (ref 0.0–1.2)
Total Protein: 6.7 g/dL (ref 6.5–8.1)

## 2024-11-19 MED ORDER — HYDROCODONE-ACETAMINOPHEN 5-325 MG PO TABS: 1.0000 | ORAL_TABLET | Freq: Four times a day (QID) | ORAL | 0 refills | Status: AC | PRN

## 2024-11-19 MED ORDER — IOHEXOL 300 MG/ML  SOLN
100.0000 mL | Freq: Once | INTRAMUSCULAR | Status: AC | PRN
  Administered 2024-11-19: 100 mL via INTRAVENOUS

## 2024-11-19 NOTE — Discharge Instructions (Addendum)
 Follow-up with Dr. Margrette in 1 to 2 weeks for recheck if not improving
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
## 2024-12-01 ENCOUNTER — Other Ambulatory Visit (INDEPENDENT_AMBULATORY_CARE_PROVIDER_SITE_OTHER): Payer: Self-pay | Admitting: Gastroenterology

## 2024-12-04 ENCOUNTER — Encounter: Payer: Self-pay | Admitting: Orthopedic Surgery

## 2024-12-04 ENCOUNTER — Ambulatory Visit: Admitting: Orthopedic Surgery

## 2024-12-04 VITALS — BP 146/82 | Ht 62.0 in | Wt 162.0 lb

## 2024-12-04 DIAGNOSIS — M546 Pain in thoracic spine: Secondary | ICD-10-CM | POA: Diagnosis not present

## 2024-12-04 DIAGNOSIS — M545 Low back pain, unspecified: Secondary | ICD-10-CM

## 2024-12-04 DIAGNOSIS — S139XXA Sprain of joints and ligaments of unspecified parts of neck, initial encounter: Secondary | ICD-10-CM | POA: Diagnosis not present

## 2024-12-04 MED ORDER — BACLOFEN 20 MG PO TABS: 20.0000 mg | ORAL_TABLET | Freq: Three times a day (TID) | ORAL | 1 refills | Status: AC | PRN

## 2024-12-04 MED ORDER — GABAPENTIN 300 MG PO CAPS: 300.0000 mg | ORAL_CAPSULE | Freq: Three times a day (TID) | ORAL | 5 refills | Status: AC
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
## 2024-12-04 NOTE — Patient Instructions (Addendum)
 Physical therapy has been ordered for you at [facility]. They should call you to schedule, [phone] is the phone number to call, if you want to call to schedule.    TAKE TYLENOL  BACLOFEN  AND  GABAPENTIN 
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
## 2024-12-06 ENCOUNTER — Ambulatory Visit
Admission: EM | Admit: 2024-12-06 | Discharge: 2024-12-06 | Disposition: A | Discharge status: Home / Self Care | Attending: Nurse Practitioner | Admitting: Nurse Practitioner

## 2024-12-06 DIAGNOSIS — Z113 Encounter for screening for infections with a predominantly sexual mode of transmission: Secondary | ICD-10-CM | POA: Diagnosis present

## 2024-12-06 DIAGNOSIS — Z202 Contact with and (suspected) exposure to infections with a predominantly sexual mode of transmission: Secondary | ICD-10-CM | POA: Diagnosis present

## 2024-12-06 MED ORDER — DOXYCYCLINE HYCLATE 100 MG PO TABS: 100.0000 mg | ORAL_TABLET | Freq: Two times a day (BID) | ORAL | 0 refills | Status: AC

## 2024-12-06 NOTE — ED Provider Notes (Addendum)
 " [facility]    CSN: [id number] Arrival date & time: 12/06/24  1018      History   Chief Complaint Chief Complaint  Patient presents with   treatment     HPI SALLEY BOXLEY is a 39 y.o. female.   The history is provided by the patient.   Patient presents for STI testing.  Patient reports that her boyfriend told her that he tested positive for chlamydia and BV.  Patient complains of vaginal irritation and vaginal odor.  She denies dysuria, vaginal discharge, abdominal pain, flank pain, or low back pain.  Patient reports 1 female partner in the past 90 days.  Patient states that her boyfriend told her that he was treated for chlamydia, states that she had sex with him within the 7-day window after completing treatment.  Patient is also requesting testing for HIV and syphilis.  Past Medical History:  Diagnosis Date   Anxiety    Back pain    Constipation    Depression    Diverticulitis 10/31/2017   GERD (gastroesophageal reflux disease)    Glaucoma of both eyes    Hematuria    History of abnormal cervical Pap smear    History of chronic gastritis    History of kidney stones    Hydronephrosis, right    Renal calculi    bilateral per ct [id number]   Right ureteral stone    Trichimoniasis 09/05/2018   Ulcerative colitis (HCC)    Urgency of urination    Uterine fibroid    Uterine polyp    Vaginal Pap smear, abnormal     Patient Active Problem List   Diagnosis Date Noted   Grade I hemorrhoids 02/21/2024   History of colitis 02/18/2024   Diarrhea due to malabsorption 02/18/2024   Abnormal biliary HIDA scan 01/17/2024   Intractable nausea and vomiting 01/14/2024   Emesis, persistent 01/09/2024   Obesity (BMI 30-39.9) 01/09/2024   Menorrhagia with regular cycle    Dyspareunia, female    Uterus prolapse    Trichomoniasis 09/05/2018   Noncompliance by refusing service= multiple NO SHOW's 05/01/2018   Dehydration 01/01/2018   Lactic acidosis 01/01/2018   SIRS  (systemic inflammatory response syndrome) (HCC) 01/01/2018   Hypercalcemia 01/01/2018   ARF (acute renal failure) 12/31/2017   Gonorrhea 12/20/2017   AKI (acute kidney injury) 06/13/2017   Hematuria 06/13/2017   Hypomagnesemia 04/29/2017   Cannabinoid hyperemesis syndrome 04/29/2017   Acute colitis 04/29/2017   Abdominal pain    Normochromic normocytic anemia    Nausea & vomiting 04/02/2017   Acute renal failure 01/16/2017   Hyponatremia 01/16/2017   Leukocytosis 01/16/2017   Nausea and vomiting 01/15/2017   Chest pain 01/15/2017   Pyelonephritis 10/11/2016   Hypokalemia 10/11/2016   Hyperglycemia 10/11/2016   Elevated blood pressure reading 10/11/2016   Left nephrolithiasis 10/11/2016   Renal colic on left side 10/11/2016   Generalized anxiety disorder 10/11/2016   Nausea with vomiting 07/26/2016   Ureteral stone 07/04/2016   UTI (urinary tract infection) 07/04/2016   Hydronephrosis of right kidney 07/04/2016   GERD (gastroesophageal reflux disease) 07/04/2016   Ureteral calculi 07/04/2016   Abdominal pain, chronic, epigastric    Hematemesis 05/16/2016   Heme + stool 05/16/2016   Abdominal pain, epigastric 05/16/2016    Past Surgical History:  Procedure Laterality Date   CHOLECYSTECTOMY N/A 03/11/2024   Procedure: LAPAROSCOPIC CHOLECYSTECTOMY;  Surgeon: Rubin Calamity, MD;  Location: [facility];  Service: General;  Laterality: N/A;  COLONOSCOPY N/A 02/21/2024   Procedure: COLONOSCOPY;  Surgeon: Cinderella Deatrice FALCON, MD;  Location: [facility];  Service: Endoscopy;  Laterality: N/A;  8:15am;asa 1-2   CYSTOSCOPY W/ URETERAL STENT PLACEMENT Right 07/04/2016   Procedure: CYSTOSCOPY WITH RIGHT RETROGRADE PYELOGRAM, RIGHT URETERAL STENT PLACEMENT;  Surgeon: Morene LELON Salines, MD;  Location: [facility];  Service: Urology;  Laterality: Right;   CYSTOSCOPY WITH RETROGRADE PYELOGRAM, URETEROSCOPY AND STENT PLACEMENT Left 11/28/2007   CYSTOSCOPY WITH RETROGRADE PYELOGRAM, URETEROSCOPY AND  STENT PLACEMENT Right 08/02/2016   Procedure: CYSTOSCOPY WITH RIGHT  RETROGRADE PYELOGRAM, URETEROSCOPY,  AND STENT EXCHANGE;  Surgeon: Morene LELON Salines, MD;  Location: [facility];  Service: Urology;  Laterality: Right;   ESOPHAGOGASTRODUODENOSCOPY N/A 05/17/2016   Dr. Harvey: LA Grade A esophagitis, mild gastritis s/p biopsy noting reactive gastritis, medium sized hiatal hernia, next EGD WITH PROPOFOL     ESOPHAGOGASTRODUODENOSCOPY (EGD) WITH PROPOFOL  N/A 01/16/2024   Procedure: ESOPHAGOGASTRODUODENOSCOPY (EGD) WITH PROPOFOL ;  Surgeon: Legrand Victory LITTIE DOUGLAS, MD;  Location: [facility] END[facility]COPY;  Service: Gastroenterology;  Laterality: N/A;   INDUCED ABORTION  2009   VAGINAL HYSTERECTOMY Bilateral 08/10/2021   Procedure: HYSTERECTOMY VAGINAL;  Surgeon: Jayne Vonn DEL, MD;  Location: [facility];  Service: Gynecology;  Laterality: Bilateral;    OB History     Gravida  3   Para  2   Term  2   Preterm      AB  1   Living  2      SAB  1   IAB      Ectopic      Multiple      Live Births  2            Home Medications    Prior to Admission medications  Medication Sig Start Date End Date Taking? Authorizing Provider  doxycycline  (VIBRA -TABS) 100 MG tablet Take 1 tablet (100 mg total) by mouth 2 (two) times daily for 7 days. 12/06/24 12/13/24 Yes Leath-Warren, Etta PARAS, NP  acetaminophen  (TYLENOL ) 500 MG tablet Take 500-1,000 mg by mouth every 6 (six) hours as needed (pain.).    [provider]  baclofen  (LIORESAL ) 20 MG tablet Take 1 tablet (20 mg total) by mouth 3 (three) times daily as needed for muscle spasms. 12/04/24   Margrette Taft BRAVO, MD  dicyclomine  (BENTYL ) 20 MG tablet Take 0.5 tablets (10 mg total) by mouth 3 (three) times daily before meals. 02/18/24   Ahmed, Muhammad F, MD  fluconazole  (DIFLUCAN ) 150 MG tablet Take 1 tablet by mouth today.  May repeat every 3 days up to 2 additional doses. 05/23/24   Leath-Warren, Etta PARAS, NP  gabapentin  (NEURONTIN )  300 MG capsule Take 1 capsule (300 mg total) by mouth 3 (three) times daily. 12/04/24   Margrette Taft BRAVO, MD  HYDROcodone -acetaminophen  (NORCO/VICODIN) 5-325 MG tablet Take 1 tablet by mouth every 6 (six) hours as needed. 11/19/24   Suzette Pac, MD  LINZESS  145 MCG CAPS capsule TAKE ONE CAPSULE ( TOTAL) BY MOUTHDAILY 12/01/24   Ahmed, Muhammad F, MD  metoCLOPramide  (REGLAN ) 5 MG tablet Take 1 tablet (5 mg total) by mouth 3 (three) times daily before meals for 14 days. 01/17/24 02/18/24  Sebastian Toribio GAILS, MD  ondansetron  (ZOFRAN ) 4 MG tablet Take 1 tablet (4 mg total) by mouth every 8 (eight) hours as needed for nausea or vomiting. Patient taking differently: Take 8 mg by mouth in the morning and at bedtime. 02/01/24   Sebastian Toribio GAILS, MD  oxyCODONE  (  ROXICODONE ) 5 MG immediate release tablet Take 1 tablet (5 mg total) by mouth every 4 (four) hours as needed for severe pain (pain score 7-10). 03/11/24   Rubin Calamity, MD  oxyCODONE  (ROXICODONE ) 5 MG immediate release tablet Take 1 tablet (5 mg total) by mouth every 4 (four) hours as needed for severe pain (pain score 7-10). 03/11/24   Rubin Calamity, MD  pantoprazole  (PROTONIX ) 40 MG tablet Take 1 tablet (40 mg total) by mouth daily. 02/18/24   Ahmed, Deatrice FALCON, MD  traMADol  (ULTRAM ) 50 MG tablet Take 1 tablet (50 mg total) by mouth every 6 (six) hours as needed. 03/11/24 03/11/25  Rubin Calamity, MD    Family History Family History  Problem Relation Age of Onset   Colon cancer Paternal Uncle        Passed away 01-07-15   Nephrolithiasis Father    Hypertension Father    Nephrolithiasis Sister    Nephrolithiasis Sister    Nephrolithiasis Sister    Hypertension Mother    Leukemia Mother    Colon cancer Maternal Grandfather    Club foot Son     Social History Social History[1]   Allergies   Nsaids, Tape, Keflex  [cephalexin ], Toradol  [ketorolac  tromethamine ], Codeine, and Vicodin [hydrocodone -acetaminophen ]   Review of  Systems Review of Systems Per HPI  Physical Exam Triage Vital Signs ED Triage Vitals  Encounter Vitals Group     BP 12/06/24 1044 (!) 172/83     Girls Systolic BP Percentile --      Girls Diastolic BP Percentile --      Boys Systolic BP Percentile --      Boys Diastolic BP Percentile --      Pulse Rate 12/06/24 1044 78     Resp 12/06/24 1044 20     Temp 12/06/24 1044 98.1 F (36.7 C)     Temp Source 12/06/24 1044 Oral     SpO2 12/06/24 1044 94 %     Weight --      Height --      Head Circumference --      Peak Flow --      Pain Score 12/06/24 1046 0     Pain Loc --      Pain Education --      Exclude from Growth Chart --    No data found.  Updated Vital Signs BP (!) 172/83 (BP Location: Right Arm)   Pulse 78   Temp 98.1 F (36.7 C) (Oral)   Resp 20   LMP 06/30/2021   SpO2 94%   Visual Acuity Right Eye Distance:   Left Eye Distance:   Bilateral Distance:    Right Eye Near:   Left Eye Near:    Bilateral Near:     Physical Exam Vitals and nursing note reviewed.  Constitutional:      General: She is not in acute distress.    Appearance: Normal appearance.  HENT:     Head: Normocephalic.  Eyes:     Extraocular Movements: Extraocular movements intact.     Pupils: Pupils are equal, round, and reactive to light.  Cardiovascular:     Rate and Rhythm: Normal rate and regular rhythm.     Pulses: Normal pulses.     Heart sounds: Normal heart sounds.  Pulmonary:     Effort: Pulmonary effort is normal. No respiratory distress.     Breath sounds: Normal breath sounds. No stridor. No wheezing, rhonchi or rales.  Abdominal:     General:  Bowel sounds are normal.     Palpations: Abdomen is soft.  Genitourinary:    Comments: GU exam deferred, self swab performed  Musculoskeletal:     Cervical back: Normal range of motion.  Neurological:     General: No focal deficit present.     Mental Status: She is alert and oriented to person, place, and time.  Psychiatric:         Mood and Affect: Mood normal.        Behavior: Behavior normal.      UC Treatments / Results  Labs (all labs ordered are listed, but only abnormal results are displayed) Labs Reviewed  HIV ANTIBODY (ROUTINE TESTING W REFLEX)  SYPHILIS: RPR W/REFLEX TO RPR TITER AND TREPONEMAL ANTIBODIES, TRADITIONAL SCREENING AND DIAGNOSIS ALGORITHM  CERVICOVAGINAL ANCILLARY ONLY    EKG   Radiology No results found.  Procedures Procedures (including critical care time)  Medications Ordered in UC Medications - No data to display  Initial Impression / Assessment and Plan / UC Course  I have reviewed the triage vital signs and the nursing notes.  Pertinent labs & imaging results that were available during my care of the patient were reviewed by me and considered in my medical decision making (see chart for details).  Lab results are pending.  In the interim, given the patient's recent exposure to chlamydia, we will go ahead and treat with doxycycline  100 mg.  Supportive care recommendations were provided and discussed with the patient to include refraining from sexual intercourse until test results are received, refraining from sexual intercourse for an additional 7 days after completing treatment, and notifying all partners.  Patient was given indications regarding follow-up.  Patient was in agreement with this plan of care and verbalizes understanding.  All questions were answered.  Patient stable for discharge.   Final Clinical Impressions(s) / UC Diagnoses   Final diagnoses:  Exposure to chlamydia  Screening examination for sexually transmitted disease     Discharge Instructions      Lab results are pending.  You will be contacted if the pending test results are abnormal.  You will also have access to the results via MyChart. Take medication as prescribed. Refrain from sexual intercourse until your test results have been received.  Also because you are receiving treatment, you  will need to refrain from sexual intercourse for an additional 7 days after completing treatment. Increase condom use with each sexual encounter. If your test results are negative and you are continue to experience symptoms, you may follow-up in this clinic or with your gynecologist for further evaluation. Follow-up as needed.     ED Prescriptions     Medication Sig Dispense Auth. Provider   doxycycline  (VIBRA -TABS) 100 MG tablet Take 1 tablet (100 mg total) by mouth 2 (two) times daily for 7 days. 14 tablet Leath-Warren, Etta PARAS, NP      PDMP not reviewed this encounter.    Gilmer Etta PARAS, NP 12/06/24 1101     [1]  Social History Tobacco Use   Smoking status: Every Day    Current packs/day: 1.00    Average packs/day: 1 pack/day for 20.0 years (20.0 ttl pk-yrs)    Types: Cigarettes   Smokeless tobacco: Never  Vaping Use   Vaping status: Never Used  Substance Use Topics   Alcohol use: No    Alcohol/week: 4.0 standard drinks of alcohol    Types: 4 Shots of liquor per week    Comment: states has not used  in 2 years   Drug use: Not Currently    Types: Marijuana     Gilmer Etta PARAS, NP 12/06/24 1102  "

## 2024-12-06 NOTE — Discharge Instructions (Signed)
 Lab results are pending.  You will be contacted if the pending test results are abnormal.  You will also have access to the results via MyChart. Take medication as prescribed. Refrain from sexual intercourse until your test results have been received.  Also because you are receiving treatment, you will need to refrain from sexual intercourse for an additional 7 days after completing treatment. Increase condom use with each sexual encounter. If your test results are negative and you are continue to experience symptoms, you may follow-up in this clinic or with your gynecologist for further evaluation. Follow-up as needed.

## 2024-12-06 NOTE — ED Triage Notes (Signed)
 Pt reports she has been exposed to chlamydia by partner x 1 day   Pt reports vaginal irritation

## 2024-12-07 LAB — SYPHILIS: RPR W/REFLEX TO RPR TITER AND TREPONEMAL ANTIBODIES, TRADITIONAL SCREENING AND DIAGNOSIS ALGORITHM: RPR Ser Ql: NONREACTIVE

## 2024-12-07 LAB — HIV ANTIBODY (ROUTINE TESTING W REFLEX): HIV Screen 4th Generation wRfx: NONREACTIVE

## 2024-12-09 ENCOUNTER — Ambulatory Visit (HOSPITAL_COMMUNITY): Payer: Self-pay

## 2024-12-09 LAB — CERVICOVAGINAL ANCILLARY ONLY
Chlamydia: POSITIVE — AB
Comment: NEGATIVE
Comment: NEGATIVE
Comment: NEGATIVE
Comment: NEGATIVE
Comment: NEGATIVE
Comment: NORMAL
Neisseria Gonorrhea: NEGATIVE

## 2024-12-17 ENCOUNTER — Other Ambulatory Visit (INDEPENDENT_AMBULATORY_CARE_PROVIDER_SITE_OTHER): Payer: Self-pay | Admitting: Gastroenterology

## 2025-01-08 ENCOUNTER — Ambulatory Visit: Admitting: Orthopedic Surgery

## 2025-01-09 ENCOUNTER — Ambulatory Visit (HOSPITAL_COMMUNITY)
